# Patient Record
Sex: Male | Born: 1940
Health system: Southern US, Community
[De-identification: ages and names within clinical notes are randomized; demographics above are authoritative.]

## PROBLEM LIST (undated history)

## (undated) DIAGNOSIS — R300 Dysuria: Secondary | ICD-10-CM

## (undated) DIAGNOSIS — E119 Type 2 diabetes mellitus without complications: Secondary | ICD-10-CM

## (undated) DIAGNOSIS — I252 Old myocardial infarction: Secondary | ICD-10-CM

## (undated) DIAGNOSIS — Z85118 Personal history of other malignant neoplasm of bronchus and lung: Secondary | ICD-10-CM

## (undated) DIAGNOSIS — M1990 Unspecified osteoarthritis, unspecified site: Secondary | ICD-10-CM

## (undated) DIAGNOSIS — E7800 Pure hypercholesterolemia, unspecified: Secondary | ICD-10-CM

## (undated) DIAGNOSIS — Z8709 Personal history of other diseases of the respiratory system: Secondary | ICD-10-CM

## (undated) DIAGNOSIS — M069 Rheumatoid arthritis, unspecified: Secondary | ICD-10-CM

## (undated) DIAGNOSIS — Z87898 Personal history of other specified conditions: Secondary | ICD-10-CM

## (undated) DIAGNOSIS — N1330 Unspecified hydronephrosis: Secondary | ICD-10-CM

## (undated) DIAGNOSIS — I2510 Atherosclerotic heart disease of native coronary artery without angina pectoris: Secondary | ICD-10-CM

## (undated) DIAGNOSIS — I10 Essential (primary) hypertension: Secondary | ICD-10-CM

## (undated) DIAGNOSIS — Z87442 Personal history of urinary calculi: Secondary | ICD-10-CM

## (undated) DIAGNOSIS — R319 Hematuria, unspecified: Secondary | ICD-10-CM

## (undated) DIAGNOSIS — Z8546 Personal history of malignant neoplasm of prostate: Secondary | ICD-10-CM

## (undated) DIAGNOSIS — R7989 Other specified abnormal findings of blood chemistry: Secondary | ICD-10-CM

## (undated) DIAGNOSIS — R351 Nocturia: Secondary | ICD-10-CM

## (undated) DIAGNOSIS — Z8701 Personal history of pneumonia (recurrent): Secondary | ICD-10-CM

## (undated) DIAGNOSIS — M1A9XX0 Chronic gout, unspecified, without tophus (tophi): Secondary | ICD-10-CM

## (undated) DIAGNOSIS — K08109 Complete loss of teeth, unspecified cause, unspecified class: Secondary | ICD-10-CM

## (undated) DIAGNOSIS — Z973 Presence of spectacles and contact lenses: Secondary | ICD-10-CM

## (undated) DIAGNOSIS — Z86010 Personal history of colonic polyps: Secondary | ICD-10-CM

## (undated) DIAGNOSIS — Z95828 Presence of other vascular implants and grafts: Secondary | ICD-10-CM

## (undated) DIAGNOSIS — N289 Disorder of kidney and ureter, unspecified: Secondary | ICD-10-CM

## (undated) DIAGNOSIS — Z860101 Personal history of adenomatous and serrated colon polyps: Secondary | ICD-10-CM

## (undated) DIAGNOSIS — C801 Malignant (primary) neoplasm, unspecified: Secondary | ICD-10-CM

## (undated) DIAGNOSIS — Z86711 Personal history of pulmonary embolism: Secondary | ICD-10-CM

## (undated) DIAGNOSIS — Z972 Presence of dental prosthetic device (complete) (partial): Secondary | ICD-10-CM

## (undated) HISTORY — DX: Presence of other vascular implants and grafts: Z95.828

## (undated) HISTORY — DX: Atherosclerotic heart disease of native coronary artery without angina pectoris: I25.10

## (undated) HISTORY — PX: CARDIAC CATHETERIZATION: SHX172

## (undated) HISTORY — PX: OTHER SURGICAL HISTORY: SHX169

## (undated) HISTORY — PX: LUNG LOBECTOMY: SHX167

## (undated) HISTORY — PX: VEIN LIGATION AND STRIPPING: SHX2653

## (undated) HISTORY — PX: COLONOSCOPY: SHX174

## (undated) HISTORY — DX: Pure hypercholesterolemia, unspecified: E78.00

## (undated) HISTORY — DX: Essential (primary) hypertension: I10

## (undated) HISTORY — DX: Hematuria, unspecified: R31.9

---
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
## 1998-04-23 ENCOUNTER — Emergency Department (HOSPITAL_COMMUNITY): Admission: EM | Admit: 1998-04-23 | Discharge: 1998-04-23 | Payer: Self-pay | Admitting: Emergency Medicine

## 1998-05-17 DIAGNOSIS — Z85118 Personal history of other malignant neoplasm of bronchus and lung: Secondary | ICD-10-CM

## 1998-05-17 HISTORY — DX: Personal history of other malignant neoplasm of bronchus and lung: Z85.118

## 1998-05-18 ENCOUNTER — Ambulatory Visit (HOSPITAL_COMMUNITY): Admission: RE | Admit: 1998-05-18 | Discharge: 1998-05-18 | Payer: Self-pay | Admitting: Urology

## 1998-06-09 ENCOUNTER — Encounter: Payer: Self-pay | Admitting: Surgery

## 1998-06-10 ENCOUNTER — Inpatient Hospital Stay (HOSPITAL_COMMUNITY): Admission: RE | Admit: 1998-06-10 | Discharge: 1998-06-14 | Payer: Self-pay | Admitting: Surgery

## 1998-06-10 ENCOUNTER — Encounter: Payer: Self-pay | Admitting: Surgery

## 1998-06-11 ENCOUNTER — Encounter: Payer: Self-pay | Admitting: Surgery

## 1998-06-12 ENCOUNTER — Encounter: Payer: Self-pay | Admitting: Surgery

## 1998-06-12 ENCOUNTER — Encounter: Payer: Self-pay | Admitting: Urology

## 1999-11-12 ENCOUNTER — Encounter (INDEPENDENT_AMBULATORY_CARE_PROVIDER_SITE_OTHER): Payer: Self-pay | Admitting: Specialist

## 1999-11-12 ENCOUNTER — Ambulatory Visit (HOSPITAL_COMMUNITY): Admission: RE | Admit: 1999-11-12 | Discharge: 1999-11-12 | Payer: Self-pay | Admitting: Gastroenterology

## 2000-01-04 ENCOUNTER — Encounter: Payer: Self-pay | Admitting: Surgery

## 2000-01-04 ENCOUNTER — Encounter: Admission: RE | Admit: 2000-01-04 | Discharge: 2000-01-04 | Payer: Self-pay | Admitting: Surgery

## 2000-07-04 ENCOUNTER — Encounter: Admission: RE | Admit: 2000-07-04 | Discharge: 2000-07-04 | Payer: Self-pay | Admitting: Surgery

## 2000-07-04 ENCOUNTER — Encounter: Payer: Self-pay | Admitting: Surgery

## 2001-07-10 ENCOUNTER — Encounter: Admission: RE | Admit: 2001-07-10 | Discharge: 2001-07-10 | Payer: Self-pay | Admitting: Surgery

## 2001-07-10 ENCOUNTER — Encounter: Payer: Self-pay | Admitting: Surgery

## 2002-07-16 ENCOUNTER — Encounter: Payer: Self-pay | Admitting: Surgery

## 2002-07-16 ENCOUNTER — Encounter: Admission: RE | Admit: 2002-07-16 | Discharge: 2002-07-16 | Payer: Self-pay | Admitting: Surgery

## 2003-04-29 ENCOUNTER — Ambulatory Visit (HOSPITAL_COMMUNITY): Admission: RE | Admit: 2003-04-29 | Discharge: 2003-04-29 | Payer: Self-pay | Admitting: Gastroenterology

## 2004-10-12 ENCOUNTER — Encounter: Admission: RE | Admit: 2004-10-12 | Discharge: 2004-10-12 | Payer: Self-pay | Admitting: Surgery

## 2005-11-21 ENCOUNTER — Encounter: Admission: RE | Admit: 2005-11-21 | Discharge: 2005-11-21 | Payer: Self-pay | Admitting: Emergency Medicine

## 2007-07-04 ENCOUNTER — Encounter: Admission: RE | Admit: 2007-07-04 | Discharge: 2007-07-04 | Payer: Self-pay | Admitting: Emergency Medicine

## 2008-02-19 ENCOUNTER — Ambulatory Visit (HOSPITAL_COMMUNITY): Admission: RE | Admit: 2008-02-19 | Discharge: 2008-02-19 | Payer: Self-pay | Admitting: Urology

## 2008-03-20 ENCOUNTER — Ambulatory Visit: Admission: RE | Admit: 2008-03-20 | Discharge: 2008-05-20 | Payer: Self-pay | Admitting: Radiation Oncology

## 2008-05-20 ENCOUNTER — Ambulatory Visit: Admission: RE | Admit: 2008-05-20 | Discharge: 2008-07-16 | Payer: Self-pay | Admitting: Radiation Oncology

## 2008-06-27 ENCOUNTER — Ambulatory Visit (HOSPITAL_BASED_OUTPATIENT_CLINIC_OR_DEPARTMENT_OTHER): Admission: RE | Admit: 2008-06-27 | Discharge: 2008-06-27 | Payer: Self-pay | Admitting: Urology

## 2008-07-18 ENCOUNTER — Ambulatory Visit: Admission: RE | Admit: 2008-07-18 | Discharge: 2008-08-30 | Payer: Self-pay | Admitting: Radiation Oncology

## 2010-12-22 ENCOUNTER — Encounter: Payer: Self-pay | Admitting: Cardiology

## 2010-12-29 ENCOUNTER — Ambulatory Visit
Admission: RE | Admit: 2010-12-29 | Discharge: 2010-12-29 | Disposition: A | Discharge status: Home / Self Care | Payer: 59 | Source: Ambulatory Visit | Attending: Family Medicine | Admitting: Family Medicine

## 2010-12-29 ENCOUNTER — Other Ambulatory Visit: Payer: Self-pay | Admitting: Family Medicine

## 2010-12-29 DIAGNOSIS — Z85118 Personal history of other malignant neoplasm of bronchus and lung: Secondary | ICD-10-CM

## 2011-01-26 ENCOUNTER — Encounter: Payer: Self-pay | Admitting: Cardiology

## 2011-01-26 DIAGNOSIS — D126 Benign neoplasm of colon, unspecified: Secondary | ICD-10-CM | POA: Insufficient documentation

## 2011-01-26 DIAGNOSIS — I10 Essential (primary) hypertension: Secondary | ICD-10-CM | POA: Insufficient documentation

## 2011-01-26 DIAGNOSIS — C3490 Malignant neoplasm of unspecified part of unspecified bronchus or lung: Secondary | ICD-10-CM | POA: Insufficient documentation

## 2011-01-26 DIAGNOSIS — I2510 Atherosclerotic heart disease of native coronary artery without angina pectoris: Secondary | ICD-10-CM | POA: Insufficient documentation

## 2011-01-26 DIAGNOSIS — E7800 Pure hypercholesterolemia, unspecified: Secondary | ICD-10-CM

## 2011-01-26 DIAGNOSIS — C61 Malignant neoplasm of prostate: Secondary | ICD-10-CM

## 2011-01-26 DIAGNOSIS — N200 Calculus of kidney: Secondary | ICD-10-CM

## 2011-01-26 DIAGNOSIS — R7302 Impaired glucose tolerance (oral): Secondary | ICD-10-CM

## 2011-01-31 ENCOUNTER — Encounter: Payer: Self-pay | Admitting: Cardiology

## 2011-01-31 ENCOUNTER — Ambulatory Visit (INDEPENDENT_AMBULATORY_CARE_PROVIDER_SITE_OTHER): Payer: 59 | Admitting: Cardiology

## 2011-01-31 VITALS — BP 126/78 | HR 58 | Ht 66.0 in | Wt 168.6 lb

## 2011-01-31 DIAGNOSIS — I2510 Atherosclerotic heart disease of native coronary artery without angina pectoris: Secondary | ICD-10-CM

## 2011-01-31 DIAGNOSIS — I10 Essential (primary) hypertension: Secondary | ICD-10-CM

## 2011-01-31 NOTE — Progress Notes (Signed)
Daniel Vega Date of Birth: [birth year]   History of Present Illness:  Daniel Vega is seen at the request of Dr. Kevan Ny for evaluation of dyspnea. He is a pleasant 70 year old white male that I have seen in the distant past. He has a history of coronary disease and reports that he had a heart attack in 1994. He underwent a cardiac catheterization in 1997. We have no records concerning that study but apparently he had some coronary disease with occlusion of the distal vessel and collateral flow. He has been treated medically and has done very well since that time. He denies any symptoms of chest pain but does report some shortness of breath on exertion when he is working hard. He also notes that he doesn't have quite as much energy as he has in the past. The only chest discomfort he reports is a jabbing sensation in his chest if he is doing too much tobacco. He has never had to take any sublingual nitroglycerin.  Current Outpatient Prescriptions on File Prior to Visit  Medication Sig Dispense Refill  . aspirin 325 MG tablet Take 325 mg by mouth daily.        . diclofenac sodium (VOLTAREN) 1 % GEL Apply topically 4 (four) times daily as needed.        Marland Kitchen glucosamine-chondroitin 500-400 MG tablet Take 1 tablet by mouth 2 (two) times daily.        Marland Kitchen lisinopril (PRINIVIL,ZESTRIL) 10 MG tablet Take 10 mg by mouth daily.        . nitroGLYCERIN (NITROSTAT) 0.4 MG SL tablet Place 0.4 mg under the tongue every 5 (five) minutes as needed.        . pravastatin (PRAVACHOL) 20 MG tablet Take 20 mg by mouth at bedtime.        . triamterene-hydrochlorothiazide (MAXZIDE-25) 37.5-25 MG per tablet Take 1 tablet by mouth daily.        . verapamil (CALAN-SR) 240 MG CR tablet Take 240 mg by mouth 2 (two) times daily.        . vitamin E 400 UNIT capsule Take 400 Units by mouth daily.          No Known Allergies  Past Medical History  Diagnosis Date  . Hypertension   . Hypercholesteremia   . Glucose intolerance  (impaired glucose tolerance)   . Kidney stones, calcium oxalate   . CAD (coronary artery disease)   . 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 Adenomatous colon polyp     Dr. Madilyn Fireman  . Prostate cancer 2009    Dr. Hillis Range; tx w/ radium implants    Past Surgical History  Procedure Date  . Lobectomy 1999    left - Dr. Laneta Simmers  . Repair right testicular torsion 1962  . Cardiac catheterization 1997    Dr. Swaziland  . Prostate radium implants 2009  . Lung cancer surgery 2003  . Colonoscopy ;11/12/99;04/29/03    12/27/09    History  Smoking status  . Former Smoker  . Types: Cigarettes  . Quit date: 10/17/1982  Smokeless tobacco  . Current User  . Types: Chew    History  Alcohol Use  . 3.5 oz/week  . 7 drink(s) per week    occassionally     Family History  Problem Relation Age of Onset  . Heart disease Mother   . Hypertension Mother   . Heart disease Father   . Hypertension Father   . Stroke Father   .  Other Brother     spinal meningitis  . Other Brother     MVA  . Cancer Sister     stomach    Review of Systems: The review of systems is positive for occasional dizziness. He reports that his cholesterol levels have been good.  All other systems were reviewed and are negative.  Physical Exam: BP 126/78  Pulse 58  Ht 5\' 6"  (1.676 m)  Wt 168 lb 9.6 oz (76.476 kg)  BMI 27.21 kg/m2 The patient is alert and oriented x 3.  The mood and affect are normal.  The skin is warm and dry.  Color is normal.  The HEENT exam reveals that the sclera are nonicteric.  The mucous membranes are moist.  The carotids are 2+ without bruits.  There is no thyromegaly.  There is no JVD.  The lungs are clear.  The chest wall is non tender.  The heart exam reveals a regular rate with a normal S1 and S2.  There are no murmurs, gallops, or rubs.  The PMI is not displaced.   Abdominal exam reveals good bowel sounds.  There is no guarding or rebound.  There is no hepatosplenomegaly or  tenderness.  There are no masses.  Exam of the legs reveal no clubbing, cyanosis, or edema.  The legs are without rashes.  The distal pulses are intact.  Cranial nerves II - XII are intact.  Motor and sensory functions are intact.  The gait is normal.  LABORATORY DATA:  ECG today demonstrates sinus bradycardia with a rate of 58 beats per minute an occasional PVC. It is otherwise normal. Assessment / Plan:

## 2011-01-31 NOTE — Assessment & Plan Note (Signed)
Well controlled on lisinopril and HCTZ

## 2011-01-31 NOTE — Assessment & Plan Note (Signed)
Remote cardiac catheterization demonstrated some degree of coronary disease. He does have some symptoms of dyspnea on exertion. I have recommended a followup stress echo to assess his risk. He is on appropriate therapy with aspirin and a statin.

## 2011-01-31 NOTE — Patient Instructions (Signed)
Continue current therapy.  Stay active with regular aerobic exercise.  I recommend you quit chewing tobacco.  We will schedule you for a stress Echo test to evaluate your coronary risk.

## 2011-02-07 ENCOUNTER — Other Ambulatory Visit (HOSPITAL_COMMUNITY): Payer: 59

## 2011-02-07 ENCOUNTER — Ambulatory Visit (HOSPITAL_COMMUNITY): Payer: 59 | Attending: Family Medicine

## 2011-02-07 ENCOUNTER — Other Ambulatory Visit (HOSPITAL_COMMUNITY): Payer: 59 | Admitting: Radiology

## 2011-02-07 ENCOUNTER — Ambulatory Visit (HOSPITAL_COMMUNITY): Payer: 59 | Attending: Cardiology | Admitting: Radiology

## 2011-02-07 DIAGNOSIS — I252 Old myocardial infarction: Secondary | ICD-10-CM | POA: Insufficient documentation

## 2011-02-07 DIAGNOSIS — R0602 Shortness of breath: Secondary | ICD-10-CM

## 2011-02-07 DIAGNOSIS — R0609 Other forms of dyspnea: Secondary | ICD-10-CM | POA: Insufficient documentation

## 2011-02-07 DIAGNOSIS — I2510 Atherosclerotic heart disease of native coronary artery without angina pectoris: Secondary | ICD-10-CM

## 2011-02-07 DIAGNOSIS — I10 Essential (primary) hypertension: Secondary | ICD-10-CM | POA: Insufficient documentation

## 2011-02-07 DIAGNOSIS — R0989 Other specified symptoms and signs involving the circulatory and respiratory systems: Secondary | ICD-10-CM

## 2011-02-07 DIAGNOSIS — E785 Hyperlipidemia, unspecified: Secondary | ICD-10-CM | POA: Insufficient documentation

## 2011-02-09 ENCOUNTER — Telehealth: Payer: Self-pay | Admitting: *Deleted

## 2011-02-09 ENCOUNTER — Ambulatory Visit (INDEPENDENT_AMBULATORY_CARE_PROVIDER_SITE_OTHER): Payer: 59 | Admitting: Cardiology

## 2011-02-09 ENCOUNTER — Encounter: Payer: Self-pay | Admitting: Cardiology

## 2011-02-09 VITALS — BP 130/76 | HR 75 | Wt 172.0 lb

## 2011-02-09 DIAGNOSIS — I10 Essential (primary) hypertension: Secondary | ICD-10-CM

## 2011-02-09 DIAGNOSIS — I2510 Atherosclerotic heart disease of native coronary artery without angina pectoris: Secondary | ICD-10-CM

## 2011-02-09 NOTE — Progress Notes (Signed)
Daniel Vega Date of Birth: 1940/12/03   History of Present Illness: Daniel Vega is seen for followup after his recent stress echo. He was able to walk 6 minutes on the Bruce protocol. He did experience symptoms of significant dyspnea and leg fatigue. He did achieve a target heart rate of 134 beats per minute. He had fairly profound ST segment depression inferolateral leads. His echocardiographic images demonstrated normal LV function without significant wall motion abnormality. Is felt to be a fairly high risk study.  Current Outpatient Prescriptions on File Prior to Visit  Medication Sig Dispense Refill  . Ascorbic Acid (VITAMIN C) 1000 MG tablet Take 1 tablet (1,000 mg total) by mouth daily.  30 tablet    . aspirin 325 MG tablet Take 325 mg by mouth daily.        Marland Kitchen glucosamine-chondroitin 500-400 MG tablet Take 1 tablet by mouth 2 (two) times daily.        Marland Kitchen lisinopril (PRINIVIL,ZESTRIL) 10 MG tablet Take 10 mg by mouth daily.        . nitroGLYCERIN (NITROSTAT) 0.4 MG SL tablet Place 0.4 mg under the tongue every 5 (five) minutes as needed.        . pravastatin (PRAVACHOL) 20 MG tablet Take 20 mg by mouth at bedtime.        . triamterene-hydrochlorothiazide (MAXZIDE-25) 37.5-25 MG per tablet Take 1 tablet by mouth daily.        . verapamil (CALAN-SR) 240 MG CR tablet Take 240 mg by mouth 2 (two) times daily.        . vitamin E 400 UNIT capsule Take 400 Units by mouth daily.        . diclofenac sodium (VOLTAREN) 1 % GEL Apply topically 4 (four) times daily as needed.          No Known Allergies  Past Medical History  Diagnosis Date  . Hypertension   . Hypercholesteremia   . Glucose intolerance (impaired glucose tolerance)   . Kidney stones, calcium oxalate   . CAD (coronary artery disease)   . 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 Adenomatous colon polyp     Dr. Madilyn Fireman  . Prostate cancer 2009    Dr. Hillis Range; tx w/ radium implants    Past Surgical History    Procedure Date  . Lobectomy 1999    left - Dr. Laneta Simmers  . Repair right testicular torsion 1962  . Cardiac catheterization 1997    Dr. [place]  . Prostate radium implants 2009  . Lung cancer surgery 2003  . Colonoscopy ;11/12/99;04/29/03    12/27/09    History  Smoking status  . Former Smoker  . Types: Cigarettes  . Quit date: 10/17/1982  Smokeless tobacco  . Current User  . Types: Chew    History  Alcohol Use  . 3.5 oz/week  . 7 drink(s) per week    occassionally     Family History  Problem Relation Age of Onset  . Heart disease Mother   . Hypertension Mother   . Heart disease Father   . Hypertension Father   . Stroke Father   . Other Brother     spinal meningitis  . Other Brother     MVA  . Cancer Sister     stomach    Review of Systems: The review of systems is positive for occasional dizziness. He reports that his cholesterol levels have been good.  All other systems were reviewed  and are negative.  Physical Exam: BP 130/76  Pulse 75  Wt 172 lb (78.019 kg) The patient is alert and oriented x 3.  The mood and affect are normal.  The skin is warm and dry.  Color is normal.  The HEENT exam reveals that the sclera are nonicteric.  The mucous membranes are moist.  The carotids are 2+ without bruits.  There is no thyromegaly.  There is no JVD.  The lungs are clear.  The chest wall is non tender.  The heart exam reveals a regular rate with a normal S1 and S2.  There are no murmurs, gallops, or rubs.  The PMI is not displaced.   Abdominal exam reveals good bowel sounds.  There is no guarding or rebound.  There is no hepatosplenomegaly or tenderness.  There are no masses.  Exam of the legs reveal no clubbing, cyanosis, or edema.  The legs are without rashes.  The distal pulses are intact.  Cranial nerves II - XII are intact.  Motor and sensory functions are intact.  The gait is normal.  LABORATORY DATA:  Prior chest x-ray in March showed no active disease. Assessment  / Plan:

## 2011-02-09 NOTE — Telephone Encounter (Signed)
Message copied by Murrell Redden on Wed Feb 09, 2011  3:24 PM ------      Message from: [place], PETER      Created: Tue Feb 08, 2011  4:07 PM       Significant ST changes but Echo images ok. Will discuss at office visit tomorrow.

## 2011-02-09 NOTE — Telephone Encounter (Signed)
Notified of Echo. To see [place] 4/25 to discuss cath.

## 2011-02-09 NOTE — Patient Instructions (Signed)
We will schedule you for a cardiac catherterization this Friday with possible intervention if needed.  Continue your current medication.

## 2011-02-09 NOTE — Assessment & Plan Note (Signed)
Continue current antihypertensive therapy. 

## 2011-02-09 NOTE — Assessment & Plan Note (Signed)
Stress test is high risk. I recommended cardiac catheterization to further define his coronary anatomy and treatment options. He will undergo this procedure is on Friday, April 27. Will consider possible PCI if needed. Fully discuss the procedure and risk with the patient and his wife and they're agreeable to proceed.

## 2011-02-10 LAB — BASIC METABOLIC PANEL
BUN: 26 mg/dL — ABNORMAL HIGH (ref 6–23)
Calcium: 9.1 mg/dL (ref 8.4–10.5)
Creatinine, Ser: 1 mg/dL (ref 0.4–1.5)

## 2011-02-10 LAB — CBC WITH DIFFERENTIAL/PLATELET
Basophils Absolute: 0 10*3/uL (ref 0.0–0.1)
HCT: 33.6 % — ABNORMAL LOW (ref 39.0–52.0)
Hemoglobin: 11.6 g/dL — ABNORMAL LOW (ref 13.0–17.0)
Lymphocytes Relative: 27.5 % (ref 12.0–46.0)
Neutrophils Relative %: 59.1 % (ref 43.0–77.0)
RBC: 3.32 Mil/uL — ABNORMAL LOW (ref 4.22–5.81)
RDW: 13.1 % (ref 11.5–14.6)

## 2011-02-10 LAB — PROTIME-INR: INR: 1 ratio (ref 0.8–1.0)

## 2011-02-11 ENCOUNTER — Observation Stay (HOSPITAL_COMMUNITY)
Admission: RE | Admit: 2011-02-11 | Discharge: 2011-02-11 | Disposition: A | Discharge status: Home / Self Care | Payer: 59 | Source: Ambulatory Visit | Attending: Cardiology | Admitting: Cardiology

## 2011-02-11 DIAGNOSIS — R9439 Abnormal result of other cardiovascular function study: Secondary | ICD-10-CM | POA: Insufficient documentation

## 2011-02-11 DIAGNOSIS — I2510 Atherosclerotic heart disease of native coronary artery without angina pectoris: Secondary | ICD-10-CM

## 2011-02-11 DIAGNOSIS — R0989 Other specified symptoms and signs involving the circulatory and respiratory systems: Secondary | ICD-10-CM | POA: Insufficient documentation

## 2011-02-11 DIAGNOSIS — R0609 Other forms of dyspnea: Secondary | ICD-10-CM | POA: Insufficient documentation

## 2011-02-13 NOTE — Cardiovascular Report (Signed)
  Daniel Vega, Daniel Vega NO.:  [id number]  MEDICAL RECORD NO.:  [id number]           PATIENT TYPE:  O  LOCATION:  6522                         FACILITY:  [facility]  PHYSICIAN:  Madisson Kulaga M. [place], M.D.  DATE OF BIRTH:  [birth year]  DATE OF PROCEDURE: DATE OF DISCHARGE:  02/11/2011                           CARDIAC CATHETERIZATION   INDICATIONS FOR PROCEDURE:  A 70 year old white male who presents with symptoms of dyspnea on exertion.  He had an abnormal stress echo showing significant ST-segment changes associated with dyspnea.  Cardiac catheterization was recommended.  He has a remote history of myocardial infarction in 1997.  PROCEDURE:  Left heart catheterization, coronary and left ventricular angiography.  ACCESS:  Via the right radial artery using standard Seldinger technique.  EQUIPMENT:  5-French 4-cm right Judkins catheter, 5-French 3.5-cm left Judkins catheter, 5-French pigtail catheter, 5-French arterial sheath.  MEDICATIONS:  Local anesthesia 1% Xylocaine, Versed 2 mg IV, verapamil 3 mg intra-arterial.  CONTRAST:  80 mL of Omnipaque.  HEMODYNAMIC DATA:  Aortic pressure was 143/68 with a mean of 95 mmHg, left ventricular pressure is 152 with EDP of 19 mmHg.  ANGIOGRAPHIC DATA:  The right coronary arises and is a dominant vessel. It has an extensive dissection from the proximal to distal vessel and is occluded distally.  There are excellent left-to-right collaterals to the distal right coronary artery.  The left main coronary artery is normal.  Left anterior descending artery has modest disease in the midvessel up to 30% immediately after the takeoff of the first diagonal and then a 40% lesion in the midvessel.  The left circumflex coronary artery is normal.  Left ventricular angiography performed in the RAO view demonstrates normal left ventricular size with inferior basal hypokinesia and overall ejection fraction of 55%.  FINAL IMPRESSION: 1.  Single-vessel occlusive atherosclerotic coronary artery disease     involving the distal right coronary artery with     excellent collateral flow. 2. Good left ventricular systolic function.  PLAN:  Would recommend continued medical therapy.          ______________________________ Quetzal Meany M. [place], M.D.     PMJ/MEDQ  D:  02/11/2011  T:  02/12/2011  Job:  [id number]  cc:   Duncan Dull, M.D.  Electronically Signed by Dakiyah Heinke [place] M.D. on 02/13/2011 12:40:18 PM

## 2011-03-01 NOTE — Op Note (Signed)
[name], Daniel Vega               ACCOUNT NO.:  [id number]   MEDICAL RECORD NO.:  [id number]          PATIENT TYPE:  AMB   LOCATION:  [facility]                         FACILITY:  [facility]   PHYSICIAN:  Bertram Millard. Dahlstedt, M.D.DATE OF BIRTH:  08/17/1941   DATE OF PROCEDURE:  06/27/2008  DATE OF DISCHARGE:                               OPERATIVE REPORT   PREOPERATIVE DIAGNOSIS:  Adenocarcinoma the prostate.   POSTOPERATIVE DIAGNOSIS:  Adenocarcinoma the prostate.   PROCEDURE:  I-125 brachytherapy.   SURGEON:  Bertram Millard. Dahlstedt, M.D.   RADIATION ONCOLOGIST:  Artist Pais. Kathrynn Running, M.D.   COMPLICATIONS:  None.   ANESTHESIA:  General with LMA.   BRIEF HISTORY:  The patient has a history of adenocarcinoma of the  prostate.  He originally presented in March with a PSA of 6.5.  Recheck  revealed a similar number.  Ultrasound of the biopsy of the prostate  revealed a normal size gland.  Out of 12 biopsies, 7 came back positive  for cancer, mainly on the left side. His histology was Gleason 3+3.   The patient has completed external beam radiation, and presents at this  time for seed implantation.  Risks and complications of the procedure  have been discussed with the patient by both Dr. Kathrynn Running and myself.  He  desires to proceed.   DESCRIPTION OF PROCEDURE:  The patient was identified in the holding  area.  He was administered preoperative IV antibiotics.  He was taken to  the operating room where general anesthetic was administered using the  LMA.  He was placed in the dorsal lithotomy position.  Rectal tube and  Foley catheter was placed.  Transrectal ultrasound probe was placed.  Dr. Kathrynn Running imaged the prostate, as well as the urethra and rectum.  Treatment planning was then performed.   Following adequate treatment plan, the needle template was placed on the  patient's perineum.  Fixation needles were then placed.  According to  plan, 27 needles were used to deliver 71 seeds.  For  specifics, please  see the oncology treatment plan.   Following placement of all seeds, the Foley catheter was removed.  Cystoscopy revealed a small clot in the bladder, but no intravesical  seeds.  The urethra was clear as well.  A catheter was not left in.  The  bladder was drained.   The patient tolerated the procedure well.  He was awakened and taken to  the PACU in stable condition.   He will be discharged home on Cipro and Vicodin.  He will follow-up with  the both Dr. Kathrynn Running in myself in 3 weeks.      Bertram Millard. Dahlstedt, M.D.  Electronically Signed     SMD/MEDQ  D:  06/27/2008  T:  06/28/2008  Job:  [id number]

## 2011-03-04 NOTE — Op Note (Signed)
   NAME:  Daniel Vega, Daniel Vega                         ACCOUNT NO.:  [id number]   MEDICAL RECORD NO.:  [id number]                   PATIENT TYPE:  AMB   LOCATION:  ENDO                                 [facility]ACILITY:  [facility]   PHYSICIAN:  John C. Madilyn Fireman, M.D.                 DATE OF BIRTH:  07-31-1941   DATE OF PROCEDURE:  04/29/2003  DATE OF DISCHARGE:                                 OPERATIVE REPORT   PROCEDURE:  Colonoscopy.   INDICATION FOR PROCEDURE:  History of adenomatous colon polyps.   DESCRIPTION OF PROCEDURE:  The patient was placed in the left lateral  decubitus position and placed on the pulse monitor with continuous low-flow  oxygen delivered by nasal cannula.  He was sedated with 75 mcg IV fentanyl  and 7.5 mg IV Versed.  The Olympus video colonoscope was inserted into the  rectum and advanced to the cecum, confirmed by transillumination at  McBurney's point and visualization of the ileocecal valve and appendiceal  orifice.  The prep was excellent.  The cecum, ascending, transverse,  descending, and sigmoid colon all appeared normal with no masses, polyps,  diverticula, or other mucosal abnormalities.  The rectum likewise appeared  normal, and retroflexed view of the anus revealed no obvious internal  hemorrhoids.  The scope was then withdrawn, and the patient returned to the  recovery room in stable condition.  He tolerated the procedure well, and  there were no immediate complications.   IMPRESSION:  Normal colonoscopy.   PLAN:  Repeat study in five years.                                               John C. Madilyn Fireman, M.D.    JCH/MEDQ  D:  04/29/2003  T:  04/29/2003  Job:  [id number]   cc:   Oley Balm. Georgina Pillion, M.D.  [address]  [place]  [place] [address]  Fax: [phone]

## 2011-03-10 NOTE — Discharge Summary (Addendum)
  NAMEPLUMMER, Daniel Vega  MEDICAL RECORD NO.:  Vega           PATIENT TYPE:  O  LOCATION:  6522                         FACILITY:  [facility]  PHYSICIAN:  Daniel Vega, M.D.  DATE OF BIRTH:  06/17/1941  DATE OF ADMISSION:  02/11/2011 DATE OF DISCHARGE:  02/11/2011                              DISCHARGE SUMMARY   DISCHARGE DIAGNOSES: 1. Coronary artery disease.     a.     Single vessel occlusive coronary artery disease with right      coronary artery with good collateral flow, and normal left      ventricular function, for medical therapy by catheterization on      February 11, 2011. 2. Hypertension. 3. Hypercholesteremia. 4. Impaired glucose tolerance. 5. History of kidney stones. 6. Adenosquamous carcinoma of the lungs. 7. Adenomatous colon polyps. 8. Prostate cancer.  HOSPITAL COURSE:  Daniel Vega is a 70 year old gentleman who is has a history of known CAD followed by Dr. [place], who was seen and follow up on February 09, 2011 after his most recent stress echo.  He had daily provided an ST-segment depression in inferolateral leads, with that echo.  Imaging demonstrated normal LV function without significant wall abnormality.  It was felt to be a fairly high risk study.  Dr. [place] recommended cardiac catheterization, which the patient underwent on February 11, 2011 by Dr. [place], demonstrating single-vessel coronary occlusive disease with involving the distal RCA with excellent collateral flow.  His EF was 55%.  Dr. [place] felt the patient would be best serve as medical therapy, has seen and examined him, and felt he is stable for discharge.  DISCHARGE LABS:  None.  DISCHARGE STUDIES:  Cardiac catheterization on February 11, 2011, please see full report for details.  MEDICATIONS: 1. Glucosamine one tablet b.i.d. 2. Vitamin E 400 units one tablet every morning. 3. Vitamin C OTC one tablet daily. 4. Aspirin 81 mg every morning. 5. Verapamil  240 mg b.i.d. 6. Stool softener one tablet daily. 7. Triamterene/hydrochlorothiazide 37.5/25 mg p.o. every morning. 8. Pravastatin 40 mg every morning. 9. Lisinopril 10 mg every morning.  DISPOSITION:  The patient will be discharged in stable condition to home.  He is instructed to keep his cath site clean and dry and call or return for any problems.  He is to maintain a heart healthy diet, and return for any symptoms concerning to him.  He will follow up with Dr. [place] as an outpatient for further monitoring.  DURATION OF DISCHARGE ENCOUNTER:  Greater than 30 minutes including physician and PA time.     Daniel Vega, P.A.C.   ______________________________ Daniel Vega, M.D.    DD/MEDQ  D:  03/07/2011  T:  03/08/2011  Job:  [id number]  Electronically Signed by Daniel Vega M.D. on 03/10/2011 11:50:39 AM Electronically Signed by Daniel Vega  on 03/18/2011 11:16:27 AM

## 2011-08-16 ENCOUNTER — Telehealth: Payer: Self-pay | Admitting: *Deleted

## 2011-08-25 ENCOUNTER — Encounter: Payer: Self-pay | Admitting: *Deleted

## 2011-09-02 ENCOUNTER — Other Ambulatory Visit: Payer: Self-pay | Admitting: Oncology

## 2011-09-06 ENCOUNTER — Telehealth: Payer: Self-pay | Admitting: Oncology

## 2011-09-06 ENCOUNTER — Ambulatory Visit: Payer: 59

## 2011-09-06 ENCOUNTER — Ambulatory Visit (HOSPITAL_BASED_OUTPATIENT_CLINIC_OR_DEPARTMENT_OTHER): Payer: 59 | Admitting: Oncology

## 2011-09-06 ENCOUNTER — Other Ambulatory Visit (HOSPITAL_BASED_OUTPATIENT_CLINIC_OR_DEPARTMENT_OTHER): Payer: 59 | Admitting: Lab

## 2011-09-06 DIAGNOSIS — D539 Nutritional anemia, unspecified: Secondary | ICD-10-CM

## 2011-09-06 LAB — CBC WITH DIFFERENTIAL/PLATELET
Basophils Absolute: 0.1 10*3/uL (ref 0.0–0.1)
EOS%: 3.1 % (ref 0.0–7.0)
HGB: 12.3 g/dL — ABNORMAL LOW (ref 13.0–17.1)
MONO#: 0.6 10*3/uL (ref 0.1–0.9)
NEUT%: 72.8 % (ref 39.0–75.0)
Platelets: 204 10*3/uL (ref 140–400)
RDW: 12.5 % (ref 11.0–14.6)
WBC: 8.9 10*3/uL (ref 4.0–10.3)

## 2011-09-06 NOTE — Progress Notes (Signed)
Note Dictated

## 2011-09-06 NOTE — Progress Notes (Signed)
CC:   Duncan Dull, M.D.  REASON FOR CONSULTATION:  Elevated iron level, rule out hemochromatosis.  HISTORY OF PRESENT ILLNESS:  [name] is a pleasant 70 year old gentleman, native of [address], currently lives in [place], also lived in [place] for a while.  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  He has had does have a past medical history significant for hypertension and hyperlipidemia, also history of coronary artery disease.  He gets his routine care at [facility] with Dr. Kevan Ny and upon his recent evaluation back in September 2012 he had a routine checkup, which showed he had a hemoglobin of 11.6.  His MCV was 103.  His white cell count was 5.1.  His iron level also slightly elevated at 290, iron saturation of 90%, and transferrin of 230.  The patient was sent to me for evaluation for possible hemochromatosis.  Clinically [name] is asymptomatic from that standpoint.  He has really never had any abnormalities in his liver function tests.  His LFTs have been checked routinely and have been normal, including the ones that were done in September 2012 showed his AST, ALT, total bilirubin, alkaline phosphatase all within normal range at this time.  However, most recently his hemoglobin has been rechecked again and his hemoglobin was 12.1 and his iron level was 275, which is slightly less, iron saturation at 90%, his transferrin was 219, which was normal.  He did have a folic acid level that was normal at 5.6, vitamin B12 at 255.  As mentioned, his MCV is slightly elevated at 103.  He had not had any thrombotic events.  He had not had any bleeding complications.  Had not really had any particular change in any dietary habits at this time.  He has continued to perform activities of daily living without any hindrance or decline.  He does report joint swelling in his hands and he did have a mildly elevated sed  rate at 21 that was done in January 2011.  REVIEW OF SYSTEMS:  He does not report any headaches, blurry vision, double vision.  Does not report any motor or sensory neuropathy.  Does not report any alteration in mental status.  Does not report any psychiatric issues or depression.  Does not report any fever, chills, sweats.  Does not report any cough, hemoptysis, hematemesis.  No nausea or vomiting or abdominal pain.  No hematochezia, melena, or genitourinary complaints.  No frequency, urgency, or hesitancy.  No musculoskeletal complaints.  No arthralgias or myalgias.  Does report arthritis and joint swelling and stiffness.  Did not report any chest pain, orthopnea, PND.  Did not report any palpitations.  Did not report any shortness of breath or difficulty breathing.  Did not report any chest pain, orthopnea, palpitations.  Did not report any nausea or vomiting.  No abdominal pain.  No hematochezia or melena.  No genitourinary complaints.  Rest of review of systems is unremarkable.  PAST MEDICAL HISTORY:  Significant for as mentioned, hypertension.  He is a pre-diabetic.  History of coronary artery disease.  History of lung cancer, status post surgical resection in 1999.  He had a history of prostate cancer with seed implants in 2009, followed by Dr. Retta Diones. Also history of hyperlipidemia.  MEDICATIONS:  He is on Xanax, aspirin, Lipitor, vitamin B12, Voltaren, glucosamine, lisinopril, nitroglycerin, Maxzide, verapamil, and vitamin E.  ALLERGIES:  None.  SOCIAL HISTORY:  He is married.  He does drink about 1 or 2 liquor drinks a day.  He has been doing that for the last 3 years.  Denied any tobacco abuse.  Worked as a Music therapist.  FAMILY HISTORY:  Really no history of blood disorders.  She could not really tell me if there is any history of hemochromatosis.  As far as he knows, there is none.  There is a history of breast cancer and gastric cancer in 2 of his  sisters.  PHYSICAL EXAMINATION:  General:  Alert, awake gentleman, appeared in no active distress.  Vital Signs:  His blood pressure today is 106/58, pulse is 64, respirations 18, temperature is 97.2.  HEENT:  Head is normocephalic, atraumatic.  Pupils equal, round, reactive to light. Oral mucosa moist and pink.  Neck:  Supple without lymphadenopathy. Heart:  Regular rate and rhythm, S1 and S2.  Lungs:  Clear to auscultation.  No rhonchi, wheezes, or dullness to percussion.  Abdomen: Soft, nontender.  No hepatosplenomegaly.  Extremities:  No clubbing, cyanosis, or edema.  Neurologically:  Intact motor, sensory, and deep tendon reflexes.  LABORATORY DATA:  Showed a hemoglobin of 12.3, white cell count of 8.9, platelet count of 204.  His MCV is slightly elevated at 100.6.  ASSESSMENT AND PLAN:  A 70 year old gentleman with the following findings: 1. Elevated iron level.  The differential diagnosis was discussed     today in detail with [name].  I feel that genetic     hemochromatosis and inherent iron metabolism disorder, I think is     less likely, but certainly a consideration.  Certainly we will     check his hemochromatosis DNA studies to make sure we are not     dealing with iron overload condition.  Other conditions that     certainly affect iron metabolism and iron increase predominantly     are usually liver disease, hepatitis, as well as cirrhosis of the     liver, which he does not really have any history that.  Certainly     alcohol consumption can certainly contribute to that and maybe he     is minimizing the amount alcohol he is taking and certainly can     affect that.  Also autoimmune disorder:  He does have really joint     stiffness and swelling.  I wonder if he had an autoimmune condition     that can certainly reflect increase in his iron metabolism.  I     would probably suggest a rheumatological workup as well at some     point.  In terms of a management  standpoint, I really see no reason     for any phlebotomy at this point.  I do not really see any evidence     too suggest end-organ damage, such as hepatotoxicity, pancreatic     insufficiency, bronze diabetes at this point.  To completely work     him up at this time, I will obtain ferritin level, as well as TIBC     and as mentioned a hemochromatosis DNA analysis.  We will ask him     to follow up in 2 to 3 months.  We will discuss these results at     that time.  I will recheck his iron levels at that time. 2. Macrocytic anemia.  Again, I am not quite sure how that fits with     his slightly elevated iron, but certainly one combining factor  would be liver disease.  Certainly alcohol consumption could cause     macrocytosis, as well as it also can cause a reactive ferritin     elevation as an acute phase reactant, and can really all be linked     together.  But nonetheless, other conditions such as B12 or folate     deficiency, which have been checked rigorously.  Myelodysplastic     syndrome also could be a possibility at this time, which could     impair iron metabolism and could manifest itself with macrocytosis     as such.  Nonetheless, again his levels are relatively mild and I     will continue to observe him for the time being.    ______________________________ Benjiman Core, M.D. FNS/MEDQ  D:  09/06/2011  T:  09/06/2011  Job:  [id number]

## 2011-09-06 NOTE — Telephone Encounter (Signed)
gv pt appt schedule for feb °

## 2011-09-10 LAB — IRON AND TIBC
%SAT: 53 % (ref 20–55)
Iron: 165 ug/dL (ref 42–165)
TIBC: 311 ug/dL (ref 215–435)
UIBC: 146 ug/dL (ref 125–400)

## 2011-09-10 LAB — HEMOCHROMATOSIS DNA-PCR(C282Y,H63D)

## 2011-12-14 ENCOUNTER — Ambulatory Visit (HOSPITAL_BASED_OUTPATIENT_CLINIC_OR_DEPARTMENT_OTHER): Payer: BC Managed Care – PPO | Admitting: Oncology

## 2011-12-14 ENCOUNTER — Other Ambulatory Visit (HOSPITAL_BASED_OUTPATIENT_CLINIC_OR_DEPARTMENT_OTHER): Payer: BC Managed Care – PPO | Admitting: Lab

## 2011-12-14 ENCOUNTER — Telehealth: Payer: Self-pay | Admitting: Oncology

## 2011-12-14 DIAGNOSIS — R7989 Other specified abnormal findings of blood chemistry: Secondary | ICD-10-CM

## 2011-12-14 LAB — CBC WITH DIFFERENTIAL/PLATELET
Basophils Absolute: 0 10*3/uL (ref 0.0–0.1)
Eosinophils Absolute: 0.2 10*3/uL (ref 0.0–0.5)
HCT: 36.9 % — ABNORMAL LOW (ref 38.4–49.9)
LYMPH%: 35.8 % (ref 14.0–49.0)
MONO#: 0.5 10*3/uL (ref 0.1–0.9)
NEUT#: 2.4 10*3/uL (ref 1.5–6.5)
NEUT%: 48.9 % (ref 39.0–75.0)
Platelets: 157 10*3/uL (ref 140–400)
WBC: 5 10*3/uL (ref 4.0–10.3)

## 2011-12-14 LAB — IRON AND TIBC
%SAT: 63 % — ABNORMAL HIGH (ref 20–55)
Iron: 195 ug/dL — ABNORMAL HIGH (ref 42–165)
TIBC: 308 ug/dL (ref 215–435)

## 2011-12-14 LAB — COMPREHENSIVE METABOLIC PANEL
BUN: 31 mg/dL — ABNORMAL HIGH (ref 6–23)
CO2: 25 mEq/L (ref 19–32)
Creatinine, Ser: 1.24 mg/dL (ref 0.50–1.35)
Glucose, Bld: 105 mg/dL — ABNORMAL HIGH (ref 70–99)
Total Bilirubin: 0.7 mg/dL (ref 0.3–1.2)
Total Protein: 7.1 g/dL (ref 6.0–8.3)

## 2011-12-14 LAB — FERRITIN: Ferritin: 572 ng/mL — ABNORMAL HIGH (ref 22–322)

## 2011-12-14 NOTE — Progress Notes (Signed)
CC:   Duncan Dull, M.D.  PRINCIPAL DIAGNOSIS:  This is a 71 year old gentleman with an elevated ferritin level.  Differential diagnosis including a history of alcohol use versus questionable hemochromatosis.  CURRENT THERAPY:  Observation and surveillance.  HISTORY OF PRESENT ILLNESS:  [name] is a pleasant 71 year old gentleman who I saw for the first time back in November of 2012 for the evaluation for possible elevated iron levels.  He is here to discuss his workup.  When I saw him at that time, his hemoglobin was normal at 12.3. He had normal differential with a slightly elevated MCV of 100.6.  His iron levels were repeated.  He had a normal level iron of 165.  His ferritin is elevated at 679, but saturation of iron was normal at 53%, TIBC was 311.  He is asymptomatic and he is not reporting any symptoms of headaches or blurry vision.  He has not reported any major changes in his performance status or activity level.  REVIEW OF SYSTEMS:  Reviewed today and was negative at this time.  PHYSICAL EXAMINATION:  Alert, awake gentleman appeared in no active distress.  His blood pressure is 113/60, pulse 67, respiration 20, temperature is 97.  Head:  Normocephalic, atraumatic.  Pupils equal, round, reactive to light.  Oral mucosa moist and pink.  Neck:  Supple. No lymphadenopathy.  Heart:  Regular rate and rhythm.  S1, S2.  Lungs: Clear to auscultation.  No rhonchi or wheeze.  Abdomen:  Soft, nontender.  No hepatosplenomegaly.  Extremities:  No edema.  LABORATORY DATA:  Today showed a hemoglobin of 13, white cell count of 5.0, platelet count of 157.  His MCV is 96.6 which was normal.  ASSESSMENT/PLAN:  A 71 year old gentleman with the following issues. 1. Elevated ferritin level.  The differential diagnosis at this time     include possible elevation due to acute phase reactant.  Ferritin     is known to have elevation in chronic inflammatory conditions.  He     does have  arthritic pains.  I wonder if he might have an autoimmune     disorder, maybe it is causing the elevation in his ferritin.  Also     alcohol consumption can play a role as well.  I doubt he has     genetic hemochromatosis.  His liver function tests are fairly     normal.  However, I am repeating it today.  At this time he does     not really need any hematological intervention. 2. Elevated mean corpuscular volume that has resolved at this time.     His MCV is perfectly normal. 3. Followup.  I will continue to monitor his blood counts.  I am going     to repeat his iron levels as well as a CBC in 6 months.    ______________________________ Benjiman Core, M.D. FNS/MEDQ  D:  12/14/2011  T:  12/14/2011  Job:  [id number]

## 2011-12-14 NOTE — Progress Notes (Signed)
Note dictated

## 2011-12-14 NOTE — Telephone Encounter (Signed)
Gv pt appt for aug2013 °

## 2012-02-06 ENCOUNTER — Ambulatory Visit
Admission: RE | Admit: 2012-02-06 | Discharge: 2012-02-06 | Disposition: A | Discharge status: Home / Self Care | Payer: BC Managed Care – PPO | Source: Ambulatory Visit | Attending: Family Medicine | Admitting: Family Medicine

## 2012-02-06 ENCOUNTER — Other Ambulatory Visit: Payer: Self-pay | Admitting: Family Medicine

## 2012-02-06 DIAGNOSIS — Z85118 Personal history of other malignant neoplasm of bronchus and lung: Secondary | ICD-10-CM

## 2012-02-06 DIAGNOSIS — M25559 Pain in unspecified hip: Secondary | ICD-10-CM

## 2012-02-06 DIAGNOSIS — J189 Pneumonia, unspecified organism: Secondary | ICD-10-CM

## 2012-02-06 DIAGNOSIS — R509 Fever, unspecified: Secondary | ICD-10-CM

## 2012-02-09 ENCOUNTER — Encounter (HOSPITAL_COMMUNITY): Payer: Self-pay | Admitting: *Deleted

## 2012-02-09 ENCOUNTER — Inpatient Hospital Stay (HOSPITAL_COMMUNITY)
Admission: EM | Admit: 2012-02-09 | Discharge: 2012-02-13 | DRG: 193 | Disposition: A | Discharge status: Home / Self Care | Payer: Medicare Other | Attending: Internal Medicine | Admitting: Internal Medicine

## 2012-02-09 ENCOUNTER — Emergency Department (HOSPITAL_COMMUNITY): Payer: Medicare Other

## 2012-02-09 DIAGNOSIS — J189 Pneumonia, unspecified organism: Principal | ICD-10-CM

## 2012-02-09 DIAGNOSIS — I959 Hypotension, unspecified: Secondary | ICD-10-CM | POA: Diagnosis present

## 2012-02-09 DIAGNOSIS — Z860100 Personal history of colon polyps, unspecified: Secondary | ICD-10-CM

## 2012-02-09 DIAGNOSIS — E860 Dehydration: Secondary | ICD-10-CM

## 2012-02-09 DIAGNOSIS — N200 Calculus of kidney: Secondary | ICD-10-CM

## 2012-02-09 DIAGNOSIS — R7302 Impaired glucose tolerance (oral): Secondary | ICD-10-CM

## 2012-02-09 DIAGNOSIS — R509 Fever, unspecified: Secondary | ICD-10-CM

## 2012-02-09 DIAGNOSIS — I10 Essential (primary) hypertension: Secondary | ICD-10-CM | POA: Diagnosis not present

## 2012-02-09 DIAGNOSIS — Z79899 Other long term (current) drug therapy: Secondary | ICD-10-CM

## 2012-02-09 DIAGNOSIS — D126 Benign neoplasm of colon, unspecified: Secondary | ICD-10-CM

## 2012-02-09 DIAGNOSIS — C3490 Malignant neoplasm of unspecified part of unspecified bronchus or lung: Secondary | ICD-10-CM

## 2012-02-09 DIAGNOSIS — J90 Pleural effusion, not elsewhere classified: Secondary | ICD-10-CM | POA: Diagnosis present

## 2012-02-09 DIAGNOSIS — Z85118 Personal history of other malignant neoplasm of bronchus and lung: Secondary | ICD-10-CM

## 2012-02-09 DIAGNOSIS — Z8546 Personal history of malignant neoplasm of prostate: Secondary | ICD-10-CM

## 2012-02-09 DIAGNOSIS — M069 Rheumatoid arthritis, unspecified: Secondary | ICD-10-CM | POA: Diagnosis present

## 2012-02-09 DIAGNOSIS — R0602 Shortness of breath: Secondary | ICD-10-CM | POA: Diagnosis not present

## 2012-02-09 DIAGNOSIS — E7800 Pure hypercholesterolemia, unspecified: Secondary | ICD-10-CM

## 2012-02-09 DIAGNOSIS — C61 Malignant neoplasm of prostate: Secondary | ICD-10-CM

## 2012-02-09 DIAGNOSIS — I2510 Atherosclerotic heart disease of native coronary artery without angina pectoris: Secondary | ICD-10-CM | POA: Diagnosis not present

## 2012-02-09 DIAGNOSIS — IMO0002 Reserved for concepts with insufficient information to code with codable children: Secondary | ICD-10-CM | POA: Diagnosis not present

## 2012-02-09 DIAGNOSIS — E871 Hypo-osmolality and hyponatremia: Secondary | ICD-10-CM | POA: Diagnosis present

## 2012-02-09 DIAGNOSIS — D696 Thrombocytopenia, unspecified: Secondary | ICD-10-CM | POA: Diagnosis not present

## 2012-02-09 DIAGNOSIS — D649 Anemia, unspecified: Secondary | ICD-10-CM | POA: Diagnosis present

## 2012-02-09 DIAGNOSIS — R918 Other nonspecific abnormal finding of lung field: Secondary | ICD-10-CM | POA: Diagnosis not present

## 2012-02-09 DIAGNOSIS — D72829 Elevated white blood cell count, unspecified: Secondary | ICD-10-CM | POA: Diagnosis present

## 2012-02-09 DIAGNOSIS — R7989 Other specified abnormal findings of blood chemistry: Secondary | ICD-10-CM

## 2012-02-09 DIAGNOSIS — Z87442 Personal history of urinary calculi: Secondary | ICD-10-CM

## 2012-02-09 DIAGNOSIS — N170 Acute kidney failure with tubular necrosis: Secondary | ICD-10-CM | POA: Diagnosis present

## 2012-02-09 DIAGNOSIS — N179 Acute kidney failure, unspecified: Secondary | ICD-10-CM

## 2012-02-09 DIAGNOSIS — Z7982 Long term (current) use of aspirin: Secondary | ICD-10-CM | POA: Diagnosis not present

## 2012-02-09 DIAGNOSIS — N289 Disorder of kidney and ureter, unspecified: Secondary | ICD-10-CM | POA: Diagnosis not present

## 2012-02-09 DIAGNOSIS — R944 Abnormal results of kidney function studies: Secondary | ICD-10-CM | POA: Diagnosis not present

## 2012-02-09 DIAGNOSIS — I517 Cardiomegaly: Secondary | ICD-10-CM | POA: Diagnosis not present

## 2012-02-09 LAB — LACTIC ACID, PLASMA: Lactic Acid, Venous: 1.1 mmol/L (ref 0.5–2.2)

## 2012-02-09 LAB — URINE MICROSCOPIC-ADD ON

## 2012-02-09 LAB — URINALYSIS, ROUTINE W REFLEX MICROSCOPIC
Ketones, ur: NEGATIVE mg/dL
Leukocytes, UA: NEGATIVE
Nitrite: NEGATIVE
Protein, ur: 100 mg/dL — AB
Urobilinogen, UA: 1 mg/dL (ref 0.0–1.0)

## 2012-02-09 LAB — CBC
HCT: 34 % — ABNORMAL LOW (ref 39.0–52.0)
MCH: 32.9 pg (ref 26.0–34.0)
MCV: 97.1 fL (ref 78.0–100.0)
Platelets: 180 10*3/uL (ref 150–400)
RBC: 3.5 MIL/uL — ABNORMAL LOW (ref 4.22–5.81)

## 2012-02-09 LAB — COMPREHENSIVE METABOLIC PANEL
AST: 16 U/L (ref 0–37)
BUN: 33 mg/dL — ABNORMAL HIGH (ref 6–23)
CO2: 20 mEq/L (ref 19–32)
Calcium: 8.9 mg/dL (ref 8.4–10.5)
Creatinine, Ser: 1.96 mg/dL — ABNORMAL HIGH (ref 0.50–1.35)
GFR calc Af Amer: 38 mL/min — ABNORMAL LOW (ref >90)
GFR calc non Af Amer: 33 mL/min — ABNORMAL LOW (ref >90)
Glucose, Bld: 126 mg/dL — ABNORMAL HIGH (ref 70–99)

## 2012-02-09 LAB — MRSA PCR SCREENING: MRSA by PCR: NEGATIVE

## 2012-02-09 LAB — SEDIMENTATION RATE: Sed Rate: 118 mm/hr — ABNORMAL HIGH (ref 0–16)

## 2012-02-09 MED ORDER — HEPARIN BOLUS VIA INFUSION
4000.0000 [IU] | Freq: Once | INTRAVENOUS | Status: AC
  Administered 2012-02-09: 4000 [IU] via INTRAVENOUS
  Filled 2012-02-09: qty 4000

## 2012-02-09 MED ORDER — HEPARIN (PORCINE) IN NACL 100-0.45 UNIT/ML-% IJ SOLN
1350.0000 [IU]/h | INTRAMUSCULAR | Status: DC
  Administered 2012-02-09: 1150 [IU]/h via INTRAVENOUS
  Filled 2012-02-09 (×2): qty 250

## 2012-02-09 MED ORDER — ACETAMINOPHEN 325 MG PO TABS
650.0000 mg | ORAL_TABLET | Freq: Once | ORAL | Status: AC
  Administered 2012-02-09: 650 mg via ORAL

## 2012-02-09 MED ORDER — SODIUM CHLORIDE 0.9 % IV BOLUS (SEPSIS)
500.0000 mL | Freq: Once | INTRAVENOUS | Status: AC
  Administered 2012-02-09: 16:00:00 via INTRAVENOUS

## 2012-02-09 MED ORDER — DEXTROSE 5 % IV SOLN
500.0000 mg | INTRAVENOUS | Status: DC
  Administered 2012-02-10 – 2012-02-12 (×3): 500 mg via INTRAVENOUS
  Filled 2012-02-09 (×4): qty 500

## 2012-02-09 MED ORDER — PANTOPRAZOLE SODIUM 40 MG PO TBEC
40.0000 mg | DELAYED_RELEASE_TABLET | Freq: Every day | ORAL | Status: DC
  Administered 2012-02-10 – 2012-02-13 (×4): 40 mg via ORAL
  Filled 2012-02-09 (×4): qty 1

## 2012-02-09 MED ORDER — ROSUVASTATIN CALCIUM 20 MG PO TABS
20.0000 mg | ORAL_TABLET | Freq: Every day | ORAL | Status: DC
  Administered 2012-02-10 – 2012-02-13 (×4): 20 mg via ORAL
  Filled 2012-02-09 (×4): qty 1

## 2012-02-09 MED ORDER — OXYCODONE HCL 5 MG PO TABS: 5.0000 mg | ORAL_TABLET | ORAL | Status: DC | PRN

## 2012-02-09 MED ORDER — NITROGLYCERIN 0.4 MG SL SUBL: 0.4000 mg | SUBLINGUAL_TABLET | SUBLINGUAL | Status: DC | PRN

## 2012-02-09 MED ORDER — SODIUM CHLORIDE 0.9 % IV BOLUS (SEPSIS)
1000.0000 mL | Freq: Once | INTRAVENOUS | Status: AC
  Administered 2012-02-09: 1000 mL via INTRAVENOUS

## 2012-02-09 MED ORDER — ONDANSETRON HCL 4 MG PO TABS: 4.0000 mg | ORAL_TABLET | Freq: Four times a day (QID) | ORAL | Status: DC | PRN

## 2012-02-09 MED ORDER — IPRATROPIUM BROMIDE 0.02 % IN SOLN: 0.5000 mg | RESPIRATORY_TRACT | Status: DC | PRN

## 2012-02-09 MED ORDER — DEXTROSE 5 % IV SOLN
1.0000 g | INTRAVENOUS | Status: DC
  Administered 2012-02-10 – 2012-02-12 (×3): 1 g via INTRAVENOUS
  Filled 2012-02-09 (×4): qty 10

## 2012-02-09 MED ORDER — AZITHROMYCIN 500 MG IV SOLR
500.0000 mg | Freq: Once | INTRAVENOUS | Status: AC
  Administered 2012-02-09: 500 mg via INTRAVENOUS
  Filled 2012-02-09: qty 500

## 2012-02-09 MED ORDER — ACETAMINOPHEN 325 MG PO TABS
650.0000 mg | ORAL_TABLET | Freq: Four times a day (QID) | ORAL | Status: DC | PRN
  Administered 2012-02-10: 650 mg via ORAL
  Filled 2012-02-09: qty 2

## 2012-02-09 MED ORDER — ALPRAZOLAM 0.25 MG PO TABS
0.2500 mg | ORAL_TABLET | Freq: Three times a day (TID) | ORAL | Status: DC | PRN
  Administered 2012-02-10: 0.25 mg via ORAL
  Filled 2012-02-09: qty 1

## 2012-02-09 MED ORDER — ONDANSETRON HCL 4 MG/2ML IJ SOLN: 4.0000 mg | Freq: Four times a day (QID) | INTRAMUSCULAR | Status: DC | PRN

## 2012-02-09 MED ORDER — ALBUTEROL SULFATE (5 MG/ML) 0.5% IN NEBU: 2.5000 mg | INHALATION_SOLUTION | RESPIRATORY_TRACT | Status: DC | PRN

## 2012-02-09 MED ORDER — SODIUM CHLORIDE 0.9 % IV BOLUS (SEPSIS)
1000.0000 mL | Freq: Once | INTRAVENOUS | Status: AC
  Administered 2012-02-10: 1000 mL via INTRAVENOUS

## 2012-02-09 MED ORDER — ACETAMINOPHEN 325 MG PO TABS
ORAL_TABLET | ORAL | Status: AC
  Administered 2012-02-09: 650 mg via ORAL
  Filled 2012-02-09: qty 2

## 2012-02-09 MED ORDER — ACETAMINOPHEN 650 MG RE SUPP: 650.0000 mg | Freq: Four times a day (QID) | RECTAL | Status: DC | PRN

## 2012-02-09 MED ORDER — DEXTROSE 5 % IV SOLN
1.0000 g | Freq: Once | INTRAVENOUS | Status: AC
  Administered 2012-02-09: 1 g via INTRAVENOUS
  Filled 2012-02-09: qty 10

## 2012-02-09 MED ORDER — SODIUM CHLORIDE 0.9 % IV BOLUS (SEPSIS): 2000.0000 mL | Freq: Once | INTRAVENOUS | Status: DC

## 2012-02-09 MED ORDER — MORPHINE SULFATE 2 MG/ML IJ SOLN: 2.0000 mg | INTRAMUSCULAR | Status: DC | PRN

## 2012-02-09 MED ORDER — SODIUM CHLORIDE 0.9 % IV SOLN
INTRAVENOUS | Status: DC
  Administered 2012-02-10: 21:00:00 via INTRAVENOUS
  Administered 2012-02-10: 1000 mL via INTRAVENOUS

## 2012-02-09 NOTE — ED Notes (Signed)
Pt states he has not felt well since Sunday, he has had a fever since that time with decreased appetite. States he has not been able to eat since Sunday

## 2012-02-09 NOTE — ED Notes (Signed)
Attempted to call report to receiving RN, RN unavailable at this time, will try to call back

## 2012-02-09 NOTE — ED Notes (Signed)
C/o generalized weakness, malaise, painful joints "all over", fever, no appetite x 1 week. Saw PCP for same on Monday, started on prednisone. Saw a rheumatologist Tuesday, had blood drawn.  Denies n/v/d, cold, cough.

## 2012-02-09 NOTE — ED Provider Notes (Signed)
History     CSN: [id number]  Arrival date & time 02/09/12  1310   First MD Initiated Contact with Patient 02/09/12 1451      Chief Complaint  Patient presents with  . Fever    (Consider location/radiation/quality/duration/timing/severity/associated sxs/prior treatment) Patient is a 71 y.o. male presenting with general illness. The history is provided by the patient and a significant other.  Illness  The current episode started 5 to 7 days ago. The problem occurs continuously. The problem has been gradually worsening. The problem is moderate. The symptoms are relieved by nothing. The symptoms are aggravated by nothing. Associated symptoms include a fever, diarrhea (single episode today) and muscle aches. Pertinent negatives include no abdominal pain, no nausea, no vomiting, no headaches, no sore throat, no neck pain, no cough and no rash. He has been less active. He has been drinking less than usual and eating less than usual. The last void occurred less than 6 hours ago. There were no sick contacts. Recently, medical care has been given by the PCP. Services received include tests performed and one or more referrals.    Past Medical History  Diagnosis Date  . Hypertension   . Hypercholesteremia   . Glucose intolerance (impaired glucose tolerance)   . Kidney stones, calcium oxalate   . CAD (coronary artery disease)   . 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 Adenomatous colon polyp     Dr. Madilyn Fireman  . Prostate cancer 2009    Dr. Hillis Range; tx w/ radium implants    Past Surgical History  Procedure Date  . Lobectomy 1999    left - Dr. Laneta Simmers  . Repair right testicular torsion 1962  . Cardiac catheterization 1997    Dr. [place]  . Prostate radium implants 2009  . Lung cancer surgery 2003  . Colonoscopy ;11/12/99;04/29/03    12/27/09    Family History  Problem Relation Age of Onset  . Heart disease Mother   . Hypertension Mother   . Heart disease Father   .  Hypertension Father   . Stroke Father   . Other Brother     spinal meningitis  . Other Brother     MVA  . Cancer Sister     stomach    History  Substance Use Topics  . Smoking status: Former Smoker    Types: Cigarettes    Quit date: 10/17/1982  . Smokeless tobacco: Current User    Types: Chew  . Alcohol Use: 3.5 oz/week    7 drink(s) per week     occassionally       Review of Systems  Constitutional: Positive for fever, chills, activity change, appetite change and fatigue.  HENT: Negative for sore throat and neck pain.   Respiratory: Negative for cough, chest tightness and shortness of breath.   Cardiovascular: Positive for chest pain (single episode 2 days prior).  Gastrointestinal: Positive for diarrhea (single episode today). Negative for nausea, vomiting and abdominal pain.  Musculoskeletal: Positive for arthralgias (BL, symmetric, diffuse).  Skin: Negative for rash.  Neurological: Negative for numbness and headaches.  All other systems reviewed and are negative.    Allergies  Review of patient's allergies indicates no known allergies.  Home Medications   Current Outpatient Rx  Name Route Sig Dispense Refill  . ALPRAZOLAM 0.25 MG PO TABS Oral Take 0.25 mg by mouth 3 (three) times daily as needed. As needed for anxiety.    . ASPIRIN EC 81 MG PO TBEC  Oral Take 81 mg by mouth daily.      . ATORVASTATIN CALCIUM 20 MG PO TABS Oral Take 40 mg by mouth daily.     Marland Kitchen VITAMIN B 12 PO Oral Take 1,000 mcg by mouth daily.      Marland Kitchen GLUCOSAMINE-CHONDROITIN 500-400 MG PO TABS Oral Take 1 tablet by mouth 2 (two) times daily.      Marland Kitchen LISINOPRIL 10 MG PO TABS Oral Take 10 mg by mouth daily.      Marland Kitchen OMEPRAZOLE 20 MG PO CPDR Oral Take 20 mg by mouth daily.    Marland Kitchen PREDNISONE 10 MG PO TABS Oral Take 30 mg by mouth daily with breakfast. For 21 days. Patient started on 02/06/12.    Marland Kitchen TRIAMTERENE-HCTZ 37.5-25 MG PO TABS Oral Take 1 tablet by mouth daily.      Marland Kitchen VERAPAMIL HCL 240 MG PO TBCR  Oral Take 240 mg by mouth 2 (two) times daily.      Marland Kitchen DICLOFENAC SODIUM 1 % TD GEL Topical Apply topically 4 (four) times daily as needed. As needed for muscle pain.    Marland Kitchen NITROGLYCERIN 0.4 MG SL SUBL Sublingual Place 0.4 mg under the tongue every 5 (five) minutes as needed.        BP 95/46  Pulse 68  Temp(Src) 101.3 F (38.5 C) (Oral)  Resp 16  SpO2 95%  Physical Exam  Constitutional: He is oriented to person, place, and time. He appears well-developed and well-nourished.  HENT:  Head: Normocephalic and atraumatic.  Right Ear: External ear normal.  Left Ear: External ear normal.  Eyes: EOM are normal.  Neck: Normal range of motion.  Cardiovascular: Normal rate, regular rhythm and normal heart sounds.   Pulmonary/Chest: Effort normal and breath sounds normal. No respiratory distress. He has no wheezes. He exhibits no tenderness.  Abdominal: Soft. There is no tenderness. There is no rebound and no guarding.  Musculoskeletal: Normal range of motion. He exhibits no edema.  Neurological: He is alert and oriented to person, place, and time.  Skin: Skin is warm and dry. No rash noted.  Psychiatric: He has a normal mood and affect.    ED Course  Procedures (including critical care time)  Labs Reviewed  CBC - Abnormal; Notable for the following:    WBC 11.9 (*)    RBC 3.50 (*)    Hemoglobin 11.5 (*)    HCT 34.0 (*)    All other components within normal limits  COMPREHENSIVE METABOLIC PANEL - Abnormal; Notable for the following:    Sodium 130 (*)    Chloride 95 (*)    Glucose, Bld 126 (*)    BUN 33 (*)    Creatinine, Ser 1.96 (*)    Albumin 3.2 (*)    GFR calc non Af Amer 33 (*)    GFR calc Af Amer 38 (*)    All other components within normal limits  SEDIMENTATION RATE - Abnormal; Notable for the following:    Sed Rate 118 (*)    All other components within normal limits  URINALYSIS, ROUTINE W REFLEX MICROSCOPIC - Abnormal; Notable for the following:    Color, Urine AMBER  (*) BIOCHEMICALS MAY BE AFFECTED BY COLOR   APPearance CLOUDY (*)    Hgb urine dipstick SMALL (*)    Protein, ur 100 (*)    All other components within normal limits  URINE MICROSCOPIC-ADD ON - Abnormal; Notable for the following:    Casts GRANULAR CAST (*)  All other components within normal limits  LACTIC ACID, PLASMA  URINE CULTURE  CULTURE, BLOOD (ROUTINE X 2)  CULTURE, BLOOD (ROUTINE X 2)   Dg Chest 2 View  02/09/2012  *RADIOLOGY REPORT*  Clinical Data: Fever  CHEST - 2 VIEW  Comparison: 02/06/2012  Findings: Moderate cardiomegaly.  Right lung clear.  Wedge-shaped focal opacity has developed in the left peripheral midlung zone. Small left pleural effusion.  No pneumothorax.  IMPRESSION: New wedge shaped the left midlung peripheral opacity worrisome for peripheral pneumonia or pulmonary infarct.  Original Report Authenticated By: Donavan Burnet, M.D.     1. Community acquired pneumonia   2. Fever   3. Renal insufficiency       MDM    Pt presents with 1 week a fatigue, malaise. Has had fever/chills for 4 days.  Single day of L sided chest pain 2 day ago with deep breaths, has since resolved.  Also c/o diffuse joint pains without swelling. Seen by PCP 3 day ago and started on steroids. Seen at rheum 2 day ago, as well.   Here pt febrile and hypotensive, but well appearing. Not tachycardic, but on a CCB. Mentating well. Giving IVF. XR c/f PNA, giving Abx. Blood cx ordered from Triage. Lactate also added.   No high concern of PE at this time, given his AKI with GFR in 30s needs hydrated prior to contrasted study. Do not feel empiric anticoagulation indicated.  No joints suggestive of septic arthritis.    Pt given 3L IVF in ED. His BP remained around 100/60.  Low, but adequate MAP. Also with nl lactate which is reassuring. Remained with normal mental status.   He did take his morning BP meds today.  Admitted to hospitalist.      Donnamarie Poag, MD 02/09/12 1946

## 2012-02-09 NOTE — ED Notes (Signed)
Patient states that he started having general weakness and joint pain on Sunday.  Pt went to PCP on Monday and was sent for further evaluation of joint pain and swelling to another MD on Tuesday.  Pt states that he was started on prednisone on Tuesday.  Pt states that he has cont to have increase in weakness and pain. To his entire body.  Pt states that he has been unable to eat all week. Pt denies any n/v.  Pt states that he did have one episode of diarrhea this morning.  Pt does have noted swelling to his bilateral hands.  Pt appears to be weak and not feel well.

## 2012-02-09 NOTE — H&P (Signed)
PCP:   Hollice Espy, MD, MD   Chief Complaint:  Fever, malaise, generalized aches and  fatigue for a few days.  HPI: This is a 71 year male, with known history of HTN, dyslipidemia, CAD, s/p cardiac catheterization 02/11/11, revealing occlusive disease of distal RCA, recommended medical management only, EF 55% at the time, urolithiasis, adenocarcinoma of prostate, s/p brachytherapy 06/2008, carcinoma of lung, diagnosed over 10 years ago, s/p left lower lobectomy by Dr Laneta Simmers, presenting with above symptoms. Apparently, he had felt unwell for about 4 days, had some peft-sided pleuritic type chest pain on 02/05/12, without cough, but with mild SOB, and fever. He went to see his PMD on 02/06/12, and was prescribed Prednisone (30 mg daily) due to non-specific nature of his complaint, on suspicion of rheumatologic disease. He was seen by the Rheumatologist Dr Judith Blonder on on 02/07/12 by referral from his PMD, and Prednisone reduced to 20 mg daily. A follow up appointment was made for 02/20/12. His PMD called today to check on him, and because of continuing symptoms, recommended ED visit. His family brought him to ED.  Allergies:  No Known Allergies    Past Medical History  Diagnosis Date  . Hypertension   . Hypercholesteremia   . Glucose intolerance (impaired glucose tolerance)   . Kidney stones, calcium oxalate   . CAD (coronary artery disease)   . 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 Adenomatous colon polyp     Dr. Madilyn Fireman  . Prostate cancer 2009    Dr. Hillis Range; tx w/ radium implants    Past Surgical History  Procedure Date  . Lobectomy 1999    left - Dr. Laneta Simmers  . Repair right testicular torsion 1962  . Cardiac catheterization 1997    Dr. [place]  . Prostate radium implants 2009  . Lung cancer surgery 2003  . Colonoscopy ;11/12/99;04/29/03    12/27/09    Prior to Admission medications   Medication Sig Start Date End Date Taking? Authorizing Provider  ALPRAZolam (XANAX) 0.25  MG tablet Take 0.25 mg by mouth 3 (three) times daily as needed. As needed for anxiety.   Yes Oneita Hurt, MD  aspirin EC 81 MG tablet Take 81 mg by mouth daily.     Yes Historical Provider, MD  atorvastatin (LIPITOR) 20 MG tablet Take 40 mg by mouth daily.    Yes Historical Provider, MD  Cyanocobalamin (VITAMIN B 12 PO) Take 1,000 mcg by mouth daily.     Yes Historical Provider, MD  glucosamine-chondroitin 500-400 MG tablet Take 1 tablet by mouth 2 (two) times daily.     Yes Historical Provider, MD  lisinopril (PRINIVIL,ZESTRIL) 10 MG tablet Take 10 mg by mouth daily.     Yes Historical Provider, MD  omeprazole (PRILOSEC) 20 MG capsule Take 20 mg by mouth daily.   Yes Historical Provider, MD  predniSONE (DELTASONE) 10 MG tablet Take 30 mg by mouth daily with breakfast. For 21 days. Patient started on 02/06/12.   Yes Historical Provider, MD  triamterene-hydrochlorothiazide (MAXZIDE-25) 37.5-25 MG per tablet Take 1 tablet by mouth daily.     Yes Historical Provider, MD  verapamil (CALAN-SR) 240 MG CR tablet Take 240 mg by mouth 2 (two) times daily.     Yes Historical Provider, MD  diclofenac sodium (VOLTAREN) 1 % GEL Apply topically 4 (four) times daily as needed. As needed for muscle pain.    Historical Provider, MD  nitroGLYCERIN (NITROSTAT) 0.4 MG SL tablet Place 0.4  mg under the tongue every 5 (five) minutes as needed.      Historical Provider, MD    Social History:  reports that he quit smoking about 29 years ago. His smoking use included Cigarettes. His smokeless tobacco use includes Chew. He reports that he drinks about 3.5 ounces of alcohol per week. He reports that he does not use illicit drugs.  Family History  Problem Relation Age of Onset  . Heart disease Mother   . Hypertension Mother   . Heart disease Father   . Hypertension Father   . Stroke Father   . Other Brother     spinal meningitis  . Other Brother     MVA  . Cancer Sister     stomach    Review of Systems:    As per HPI and chief complaint. Patent denies weight loss, headache, blurred vision, difficulty in speaking, dysphagia, cough, orthopnea, paroxysmal nocturnal dyspnea, nausea, diaphoresis, abdominal pain, vomiting, diarrhea, belching, heartburn, hematemesis, melena, dysuria, nocturia, urinary frequency, hematochezia, lower extremity swelling, pain, or redness. The rest of the systems review is negative.  Physical Exam: General:  Patient does not appear to be in obvious acute distress. Alert, communicative, fully oriented, talking in complete sentences, not short of breath at rest. Mildly sweaty. HEENT:  Looks flushed, mild clinical pallor, no jaundice, no conjunctival injection or discharge. Visible buccal mucosa is "dry".  NECK:  Supple, JVP not seen, no carotid bruits, no palpable lymphadenopathy, no palpable goiter. CHEST:  Clinically clear to auscultation, no wheezes, no crackles. Old left thoracotomy scar is seen, and is unremarkable. HEART:  Sounds 1 and 2 heard, normal, regular, no murmurs. ABDOMEN:  Full, soft, non-tender, no palpable organomegaly, no palpable masses, normal bowel sounds. GENITALIA:  Not examined. LOWER EXTREMITIES:  No pitting edema, palpable peripheral pulses. MUSCULOSKELETAL SYSTEM:  Generalized osteoarthritic changes, otherwise, normal. CENTRAL NERVOUS SYSTEM:  No focal neurologic deficit on gross examination.  Labs on Admission:  Results for orders placed during the hospital encounter of 02/09/12 (from the past 48 hour(s))  CBC     Status: Abnormal   Collection Time   02/09/12  3:08 PM      Component Value Range Comment   WBC 11.9 (*) 4.0 - 10.5 (K/uL)    RBC 3.50 (*) 4.22 - 5.81 (MIL/uL)    Hemoglobin 11.5 (*) 13.0 - 17.0 (g/dL)    HCT 16.1 (*) 09.6 - 52.0 (%)    MCV 97.1  78.0 - 100.0 (fL)    MCH 32.9  26.0 - 34.0 (pg)    MCHC 33.8  30.0 - 36.0 (g/dL)    RDW 04.5  40.9 - 81.1 (%)    Platelets 180  150 - 400 (K/uL)   COMPREHENSIVE METABOLIC PANEL      Status: Abnormal   Collection Time   02/09/12  3:08 PM      Component Value Range Comment   Sodium 130 (*) 135 - 145 (mEq/L)    Potassium 4.5  3.5 - 5.1 (mEq/L)    Chloride 95 (*) 96 - 112 (mEq/L)    CO2 20  19 - 32 (mEq/L)    Glucose, Bld 126 (*) 70 - 99 (mg/dL)    BUN 33 (*) 6 - 23 (mg/dL)    Creatinine, Ser 9.14 (*) 0.50 - 1.35 (mg/dL)    Calcium 8.9  8.4 - 10.5 (mg/dL)    Total Protein 7.6  6.0 - 8.3 (g/dL)    Albumin 3.2 (*) 3.5 - 5.2 (g/dL)  AST 16  0 - 37 (U/L)    ALT 11  0 - 53 (U/L)    Alkaline Phosphatase 46  39 - 117 (U/L)    Total Bilirubin 0.8  0.3 - 1.2 (mg/dL)    GFR calc non Af Amer 33 (*) >90 (mL/min)    GFR calc Af Amer 38 (*) >90 (mL/min)   SEDIMENTATION RATE     Status: Abnormal   Collection Time   02/09/12  3:08 PM      Component Value Range Comment   Sed Rate 118 (*) 0 - 16 (mm/hr)   URINALYSIS, ROUTINE W REFLEX MICROSCOPIC     Status: Abnormal   Collection Time   02/09/12  3:38 PM      Component Value Range Comment   Color, Urine AMBER (*) YELLOW  BIOCHEMICALS MAY BE AFFECTED BY COLOR   APPearance CLOUDY (*) CLEAR     Specific Gravity, Urine 1.017  1.005 - 1.030     pH 5.5  5.0 - 8.0     Glucose, UA NEGATIVE  NEGATIVE (mg/dL)    Hgb urine dipstick SMALL (*) NEGATIVE     Bilirubin Urine NEGATIVE  NEGATIVE     Ketones, ur NEGATIVE  NEGATIVE (mg/dL)    Protein, ur 161 (*) NEGATIVE (mg/dL)    Urobilinogen, UA 1.0  0.0 - 1.0 (mg/dL)    Nitrite NEGATIVE  NEGATIVE     Leukocytes, UA NEGATIVE  NEGATIVE    URINE MICROSCOPIC-ADD ON     Status: Abnormal   Collection Time   02/09/12  3:38 PM      Component Value Range Comment   RBC / HPF 0-2  <3 (RBC/hpf)    Casts GRANULAR CAST (*) NEGATIVE     Urine-Other AMORPHOUS URATES/PHOSPHATES     LACTIC ACID, PLASMA     Status: Normal   Collection Time   02/09/12  5:45 PM      Component Value Range Comment   Lactic Acid, Venous 1.1  0.5 - 2.2 (mmol/L)     Radiological Exams on Admission: *RADIOLOGY REPORT*    Clinical Data: Fever  CHEST - 2 VIEW  Comparison: 02/06/2012  Findings: Moderate cardiomegaly. Right lung clear. Wedge-shaped  focal opacity has developed in the left peripheral midlung zone.  Small left pleural effusion. No pneumothorax.  IMPRESSION:  New wedge shaped the left midlung peripheral opacity worrisome for  peripheral pneumonia or pulmonary infarct.   Assessment/Plan Principal Problem:  *Community acquired pneumonia: Patient presents with fever, chills, malaise and generalized aches, with a mild leukocytosis and new CXR opacity, which was not seen on CXR of 02/06/12. Clinically, this appears consistent with community-acquired pneumonia, however, the wedge-shaped nature of the X-Ray opacity, is concerning for a pulmonary infarct, although patient has no hemoptysis. Unfortunately, patient's elevated creatinine militates against doing a chest CT angiogram. We shall therefore, commence treatment with iv Rocehin/Azithromycin, arrange a V/Q scan, and pending that, commence iv Heparin. Patient will need close monitoring until there issue is resolved, so admission will be to SDU initially. Active Problems:  1. Dehydration: Patient presented with a BUN of 33, creatinine of 1.96, and dry mucosae, consistent with dehydration, against a background of poor oral intake and diuretic therapy. We shall  Manage with iv fluid hydration. Improvement is anticipated  2. Hypotension: this is secondary to #1 above, against a background of continuing antihypertensive therapy. He has already been bolused with iv fluids in the ED. IV fluids will be continued. Of course, all pre-admission  antihypertensives will be placed on hold.   3. AKI (acute kidney injury): As described above, BUN is 33, creatinine is 1.96. Baseline creatinine was 1.0, as of 01/2011. These findings are consistent with acute kidney injury, likely pre-renal, due to dehydration and volume depletion, although hypotension may be contributory. We  shall hold ACE-i, diuretics, NSAIDS, and avoid any further nephrotoxins. Given his history of prostate Cancer, and urolithiasis, a renal ultra-sound will be arranged. 4. CAD: Known to have single-vessel RCA disease with good collaterals and preserved EF. Currently stable on medical management. 5. Prostate cancer: Patient has been stable from this viewpoint, following Brachytherapy in 2009, and follows up with Dr Retta Diones.  Further management will depend on clinical course.  Time Spent on Admission: 1 hour.  Kahleb Mcclane,CHRISTOPHER 02/09/2012, 7:14 PM

## 2012-02-09 NOTE — Progress Notes (Signed)
ANTICOAGULATION CONSULT NOTE - Initial Consult  Pharmacy Consult for Heparin Indication: pulmonary embolus (rule out; V/Q scan ordered)  No Known Allergies  Patient Measurements:   Weight ~ 80 kg Height ~ 67 inches IBW ~ 66 kg  Heparin Dosing Weight: 80 kg  Vital Signs: Temp: 98.4 F (36.9 C) (04/25 2045) Temp src: Oral (04/25 2045) BP: 90/52 mmHg (04/25 2045) Pulse Rate: 60  (04/25 2045)  Labs:  Basename 02/09/12 1508  HGB 11.5*  HCT 34.0*  PLT 180  APTT --  LABPROT --  INR --  HEPARINUNFRC --  CREATININE 1.96*  CKTOTAL --  CKMB --  TROPONINI --   CrCl ~ 35 ml/min  Medical History: Past Medical History  Diagnosis Date  . Hypertension   . Hypercholesteremia   . Glucose intolerance (impaired glucose tolerance)   . Kidney stones, calcium oxalate   . CAD (coronary artery disease)   . 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 Adenomatous colon polyp     Dr. Madilyn Fireman  . Prostate cancer 2009    Dr. Hillis Range; tx w/ radium implants    Medications:  Prescriptions prior to admission  Medication Sig Dispense Refill  . ALPRAZolam (XANAX) 0.25 MG tablet Take 0.25 mg by mouth 3 (three) times daily as needed. As needed for anxiety.      Marland Kitchen aspirin EC 81 MG tablet Take 81 mg by mouth daily.        Marland Kitchen atorvastatin (LIPITOR) 20 MG tablet Take 40 mg by mouth daily.       . Cyanocobalamin (VITAMIN B 12 PO) Take 1,000 mcg by mouth daily.        Marland Kitchen glucosamine-chondroitin 500-400 MG tablet Take 1 tablet by mouth 2 (two) times daily.        Marland Kitchen lisinopril (PRINIVIL,ZESTRIL) 10 MG tablet Take 10 mg by mouth daily.        Marland Kitchen omeprazole (PRILOSEC) 20 MG capsule Take 20 mg by mouth daily.      . predniSONE (DELTASONE) 10 MG tablet Take 30 mg by mouth daily with breakfast. For 21 days. Patient started on 02/06/12.      Marland Kitchen triamterene-hydrochlorothiazide (MAXZIDE-25) 37.5-25 MG per tablet Take 1 tablet by mouth daily.        . verapamil (CALAN-SR) 240 MG CR tablet Take 240 mg by  mouth 2 (two) times daily.        . diclofenac sodium (VOLTAREN) 1 % GEL Apply topically 4 (four) times daily as needed. As needed for muscle pain.      . nitroGLYCERIN (NITROSTAT) 0.4 MG SL tablet Place 0.4 mg under the tongue every 5 (five) minutes as needed.          Assessment: 71 yo M presented to ER 02/09/2012 with fever, malaise, generalized aches and pains since Sunday 4/21.  Pt was started on Prednisone outpatient for presumed rheumatologic disease.  Since there was no improvement, the patient was advised to come to the ER.  CXR shows new opacity consistent with CAP.  Howrever, there is also concern for PE.  Unfortunately the patient is not appropriate for CT scan with elevated SCR (CrCl ~ 35).  Plan for V/Q scan to r/o PE.   Goal of Therapy:  Heparin level 0.3-0.7 units/ml   Plan:  Heparin 4000 units IV bolus IV x 1 dose. Heparin infusion at 1150 units/hr. Heparin level in 6 hours. Heparin level and CBC daily while on heparin. Follow-up results for V/Q scan.  [employer], Pharm.D., BCPS Clinical Pharmacist Pager [phone] 02/09/2012 10:10 PM

## 2012-02-09 NOTE — ED Provider Notes (Signed)
Medical screening exam- pt presenting with fever, diffuse joint pains, decreased appetite and generalized fatigue. He was seen this week by PMD and rheumatology and started on prednisone.  He states fever today 103.4, pt awake, alert, lungs CTA, CV rrr, abdomen nontender, ND, NABS.  Labs including cultures, CXR, urine, IV ordered- pt to be moved to main ED for further evalauation.   Ethelda Chick, MD 02/09/12 [phone]

## 2012-02-09 NOTE — Consult Note (Signed)
Hospital Consult Note Date: 02/09/2012  Patient name: Daniel Vega Medical record number: [id number] Date of birth: 04/20/1941 Age: 71 y.o. Gender: male PCP: Hollice Espy, MD, MD  Medical Service: PCCM/ Critical Care Attending name: Dr. Kendrick Fries  Reason for consult: Eval for hypotension  Lines, Tubes, etc: none  Microbiology: Urine culture Blood culture  Antibiotics:  Azithromycin 4/25 Ceftriaxone 4/25  Studies/Events: CXR with wedge shaped white area  Consults:  [facility] is primary, PCCM is consult  History of Present Illness: Daniel Vega is a pleasant 71 y/o male with a history of lung cancer, prostate cancer and CAD who presented to the ED on 02/09/2012 with several days of fatigue, dyspnea and chest pain.  He has been experiencing fatigue for several weeks.  Five days prior to admission he noted swelling in his extremities and a purple change in the skin of his R leg with associated pain in his R leg and left chest.  He was seen by his PCP who ordered labwork and a CXR and referred him to Rheumatology.  He was started on prednisone for the chest pain.  He said that the prednisone made him feel great at first but his weakness and shortness of breath persisted.  Rheumatology evidently felt that their was no clear evidence of Rheumatologic disease (however I don't have the documentation to review).  His leg swelling has improved.  He presented to the ED on 4/25 for worsening fever, chills, chest pain, and shortness of breath.  He developed a dry cough after admission and PCCM was consulted (by [facility]) for persistently low BP.   Meds: Medications Prior to Admission  Medication Sig Dispense Refill  . ALPRAZolam (XANAX) 0.25 MG tablet Take 0.25 mg by mouth 3 (three) times daily as needed. As needed for anxiety.      Marland Kitchen aspirin EC 81 MG tablet Take 81 mg by mouth daily.        Marland Kitchen atorvastatin (LIPITOR) 20 MG tablet Take 40 mg by mouth daily.       . Cyanocobalamin (VITAMIN B 12 PO)  Take 1,000 mcg by mouth daily.        Marland Kitchen glucosamine-chondroitin 500-400 MG tablet Take 1 tablet by mouth 2 (two) times daily.        Marland Kitchen lisinopril (PRINIVIL,ZESTRIL) 10 MG tablet Take 10 mg by mouth daily.        Marland Kitchen omeprazole (PRILOSEC) 20 MG capsule Take 20 mg by mouth daily.      . predniSONE (DELTASONE) 10 MG tablet Take 30 mg by mouth daily with breakfast. For 21 days. Patient started on 02/06/12.      Marland Kitchen triamterene-hydrochlorothiazide (MAXZIDE-25) 37.5-25 MG per tablet Take 1 tablet by mouth daily.        . verapamil (CALAN-SR) 240 MG CR tablet Take 240 mg by mouth 2 (two) times daily.        . diclofenac sodium (VOLTAREN) 1 % GEL Apply topically 4 (four) times daily as needed. As needed for muscle pain.      . nitroGLYCERIN (NITROSTAT) 0.4 MG SL tablet Place 0.4 mg under the tongue every 5 (five) minutes as needed.          Allergies: Review of patient's allergies indicates no known allergies. Past Medical History  Diagnosis Date  . Hypertension   . Hypercholesteremia   . Glucose intolerance (impaired glucose tolerance)   . Kidney stones, calcium oxalate   . CAD (coronary artery disease)   . 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 Adenomatous colon polyp     Dr. Madilyn Fireman  . Prostate cancer 2009    Dr. Hillis Range; tx w/ radium implants   Past Surgical History  Procedure Date  . Lobectomy 1999    left - Dr. Laneta Simmers  . Repair right testicular torsion 1962  . Cardiac catheterization 1997    Dr. [place]  . Prostate radium implants 2009  . Lung cancer surgery 2003  . Colonoscopy ;11/12/99;04/29/03    12/27/09   Family History  Problem Relation Age of Onset  . Heart disease Mother   . Hypertension Mother   . Heart disease Father   . Hypertension Father   . Stroke Father   . Other Brother     spinal meningitis  . Other Brother     MVA  . Cancer Sister     stomach   History   Social History  . Marital Status: Married    Spouse Name: N/A    Number of Children: 5    . Years of Education: N/A   Occupational History  . carpenter     [employer]   Social History Main Topics  . Smoking status: Former Smoker    Types: Cigarettes    Quit date: 10/17/1982  . Smokeless tobacco: Current User    Types: Chew  . Alcohol Use: 3.5 oz/week    7 drink(s) per week     occassionally   . Drug Use: No  . Sexually Active: Yes    Birth Control/ Protection: None   Other Topics Concern  . Not on file   Social History Narrative  . No narrative on file    Review of Systems: Gen: Notes recent fever, chills, no weight change, notes some fatigue, night sweats HEENT: Denies blurred vision, double vision, hearing loss, tinnitus, sinus congestion, rhinorrhea, sore throat, neck stiffness, dysphagia PULM: per hpi CV:  Per hpi GI: Denies abdominal pain, nausea, vomiting, diarrhea, hematochezia, melena, constipation, change in bowel habits GU: Denies dysuria, hematuria, polyuria, oliguria, urethral discharge Endocrine: Denies hot or cold intolerance, polyuria, polyphagia or appetite change Derm: Denies rash, dry skin, scaling or peeling skin change Heme: Denies easy bruising, bleeding, bleeding gums Neuro: Denies headache, numbness, weakness, slurred speech, loss of memory or consciousness   Physical Exam: Blood pressure 85/51, pulse 56, temperature 98.2 F (36.8 C), temperature source Oral, resp. rate 18, height 5\' 7"  (1.702 m), weight 182 lb 5.1 oz (82.7 kg), SpO2 96.00%.  General: resting in bed, sleeping, arousable, nad HEENT: PERRL, EOMI, no scleral icterus Cardiac: RRR, S1 S2 nl, no mgr Pulm: air movement heard bilaterally, cta B, ? Crackle L base Abd: soft, nontender, nondistended, BS present Ext: warm and well perfused, no pedal edema Neuro: alert and oriented X4, cranial nerves II-XII grossly intact, able to ambulate without difficulty  Lab results: Basic Metabolic Panel:  Basename 02/09/12 1508  NA 130*  K 4.5  CL 95*  CO2 20  GLUCOSE 126*   BUN 33*  CREATININE 1.96*  CALCIUM 8.9  MG --  PHOS --   Liver Function Tests:  Basename 02/09/12 1508  AST 16  ALT 11  ALKPHOS 46  BILITOT 0.8  PROT 7.6  ALBUMIN 3.2*   CBC:  Basename 02/09/12 1508  WBC 11.9*  NEUTROABS --  HGB 11.5*  HCT 34.0*  MCV 97.1  PLT 180   Imaging results:  Dg Chest 2 View  02/09/2012  *RADIOLOGY REPORT*  Clinical Data: Fever  CHEST - 2 VIEW  Comparison: 02/06/2012  Findings: Moderate cardiomegaly.  Right lung clear.  Wedge-shaped focal opacity has developed in the left peripheral midlung zone. Small left pleural effusion.  No pneumothorax.  IMPRESSION: New wedge shaped the left midlung peripheral opacity worrisome for peripheral pneumonia or pulmonary infarct.  Original Report Authenticated By: Donavan Burnet, M.D.     Assessment & Plan by Problem:   *Community acquired pneumonia versus pulmonary infarction - His history is consistent with both a PE with possible infarct and CAP. (PE a concern given history of leg pain and then pain with breathing).    -->Azithromycin/Ceftriaxone -->Blood cultures pending, urine culture pending -->Heparin drip empirically -->Agree with V/Q scan in the morning as no CT given AKI -->Follow heparin levels, CBC, PT/INR    Dehydration/Hypotension - Likely volume depletion from poor PO intake and infection. Has received 3-4 L at this time, 1 L additional now.  Currently not septic by vitals.  His UOP and mental status are adequate and his lactic acid is normal, all indicating normal perfusion tonight.  Consider underlying adrenal insufficiency given prolonged fatigue, skin changes, hyponatremia.    -->Blood pressure stable now, additional 1 L bolus -->Additional 1 L bolus now -->Prednisone prior to admission so no role to check cortisol, but if hypotension continues would consider usefulness of a cosyntropin stim test to evaluate for adrenal insufficiency   AKI (acute kidney injury) - Cr 1.9 on admission,  baseline 1. Will watch on fluids. Renal US due to hx of urolithiasis and prostate cancer.   -->Renal US -->Follow BMP  CAD - History of medical management, S/P CABG. Stress last in 2006.   History of lung cancer/prostate cancer - Lung cancer with left lobectomy in 90s, prostate s/p brachytherapy in 2009. Currently not in treatment.   Disposition - Pt is stable to stay in SDU, no need for CVC at this time. Will give 1 L bolus at this time.   Best Practice: DVT: heparin drip SUP: protonix PO Nutrition: heart healthy Glycemic control: CBGs Sedation/analgesia: morphine / oxy IR  Signed: Genella Mech 02/09/2012, 11:48 PM   I have seen and examined the patient with nurse practitioner/resident and agree with and have modified the note above.   Yolonda Kida PCCM Pager: [phone] Cell: [phone] If no response, call [phone]

## 2012-02-10 ENCOUNTER — Inpatient Hospital Stay (HOSPITAL_COMMUNITY): Payer: Medicare Other

## 2012-02-10 ENCOUNTER — Encounter (HOSPITAL_COMMUNITY): Payer: Self-pay | Admitting: Radiology

## 2012-02-10 DIAGNOSIS — R7989 Other specified abnormal findings of blood chemistry: Secondary | ICD-10-CM | POA: Diagnosis present

## 2012-02-10 DIAGNOSIS — N179 Acute kidney failure, unspecified: Secondary | ICD-10-CM | POA: Diagnosis not present

## 2012-02-10 DIAGNOSIS — I2510 Atherosclerotic heart disease of native coronary artery without angina pectoris: Secondary | ICD-10-CM | POA: Diagnosis not present

## 2012-02-10 DIAGNOSIS — R509 Fever, unspecified: Secondary | ICD-10-CM

## 2012-02-10 DIAGNOSIS — J189 Pneumonia, unspecified organism: Principal | ICD-10-CM

## 2012-02-10 DIAGNOSIS — R791 Abnormal coagulation profile: Secondary | ICD-10-CM

## 2012-02-10 DIAGNOSIS — E860 Dehydration: Secondary | ICD-10-CM | POA: Diagnosis not present

## 2012-02-10 DIAGNOSIS — D696 Thrombocytopenia, unspecified: Secondary | ICD-10-CM | POA: Diagnosis not present

## 2012-02-10 DIAGNOSIS — I517 Cardiomegaly: Secondary | ICD-10-CM | POA: Diagnosis not present

## 2012-02-10 DIAGNOSIS — J90 Pleural effusion, not elsewhere classified: Secondary | ICD-10-CM | POA: Diagnosis not present

## 2012-02-10 DIAGNOSIS — R944 Abnormal results of kidney function studies: Secondary | ICD-10-CM | POA: Diagnosis not present

## 2012-02-10 DIAGNOSIS — D72829 Elevated white blood cell count, unspecified: Secondary | ICD-10-CM | POA: Diagnosis present

## 2012-02-10 DIAGNOSIS — I10 Essential (primary) hypertension: Secondary | ICD-10-CM | POA: Diagnosis not present

## 2012-02-10 DIAGNOSIS — I959 Hypotension, unspecified: Secondary | ICD-10-CM

## 2012-02-10 LAB — D-DIMER, QUANTITATIVE: D-Dimer, Quant: 1.28 ug/mL-FEU — ABNORMAL HIGH (ref 0.00–0.48)

## 2012-02-10 LAB — COMPREHENSIVE METABOLIC PANEL
Alkaline Phosphatase: 47 U/L (ref 39–117)
Calcium: 7.9 mg/dL — ABNORMAL LOW (ref 8.4–10.5)
Chloride: 103 mEq/L (ref 96–112)
GFR calc Af Amer: 61 mL/min — ABNORMAL LOW (ref >90)
GFR calc non Af Amer: 52 mL/min — ABNORMAL LOW (ref >90)
Sodium: 135 mEq/L (ref 135–145)
Total Protein: 6.6 g/dL (ref 6.0–8.3)

## 2012-02-10 LAB — URINE CULTURE

## 2012-02-10 LAB — CBC
Hemoglobin: 10.8 g/dL — ABNORMAL LOW (ref 13.0–17.0)
MCHC: 33.6 g/dL (ref 30.0–36.0)
Platelets: 171 10*3/uL (ref 150–400)
RDW: 13.8 % (ref 11.5–15.5)

## 2012-02-10 LAB — HEPARIN LEVEL (UNFRACTIONATED)
Heparin Unfractionated: 0.22 IU/mL — ABNORMAL LOW (ref 0.30–0.70)
Heparin Unfractionated: 0.31 IU/mL (ref 0.30–0.70)

## 2012-02-10 MED ORDER — PREDNISONE 20 MG PO TABS
30.0000 mg | ORAL_TABLET | Freq: Every day | ORAL | Status: DC
  Administered 2012-02-11: 30 mg via ORAL
  Filled 2012-02-10 (×2): qty 1

## 2012-02-10 MED ORDER — ASPIRIN EC 81 MG PO TBEC
81.0000 mg | DELAYED_RELEASE_TABLET | Freq: Every day | ORAL | Status: DC
  Administered 2012-02-10 – 2012-02-13 (×4): 81 mg via ORAL
  Filled 2012-02-10 (×5): qty 1

## 2012-02-10 MED ORDER — IOHEXOL 350 MG/ML SOLN
100.0000 mL | Freq: Once | INTRAVENOUS | Status: AC | PRN
  Administered 2012-02-10: 100 mL via INTRAVENOUS

## 2012-02-10 NOTE — Progress Notes (Signed)
[facility]   Subjective: Alert. Has developed significant cough after arrival and rehydration. Also complaining of left chest cramping pleuritic in nature but no pain in chest with breathing. Also endorses 2 episodes of diarrhea since admission. Prior to admission patient had issues with bilateral hand and feet swelling and was referred to an orthopedic physician and was later started on prednisone for present rheumatoid arthritis? Also had bilateral lower extremity swelling especially in the calf greater on the right. This is the same leg patient has had prior vein stripping in and has a history of severe varicose veins. Currently denies shortness of breath.  Objective: Vital signs in last 24 hours: Temp:  [97.8 F (36.6 C)-101.3 F (38.5 C)] 98.1 F (36.7 C) (04/26 1253) Pulse Rate:  [52-83] 80  (04/26 1253) Resp:  [16-22] 22  (04/26 1253) BP: (85-141)/(46-86) 140/73 mmHg (04/26 1253) SpO2:  [93 %-98 %] 98 % (04/26 1253) Weight:  [82.7 kg (182 lb 5.1 oz)-83.5 kg (184 lb 1.4 oz)] 83.5 kg (184 lb 1.4 oz) (04/26 0000) Weight change:  Last BM Date: 02/09/12  Intake/Output from previous day: 04/25 0701 - 04/26 0700 In: 2896 [I.V.:1896; IV Piggyback:1000] Out: 1051 [Urine:1050; Stool:1] Intake/Output this shift: Total I/O In: 227 [I.V.:227] Out: 101 [Urine:100; Stool:1]  General appearance: alert, cooperative, appears stated age and no distress Resp: clear to auscultation bilaterally, room air with saturations 97 and 98 Cardio: regular rate and sinus rhythm, S1, S2 normal, no murmur, click, rub or gallop, IV heparin is infusing GI: soft, non-tender; bowel sounds normal; no masses,  no organomegaly Extremities: extremities normal, atraumatic, no cyanosis or edema, noted significant distended varicosities the left lower extremity, mild right lower extremity edema nonpitting Neurologic: Grossly normal  Lab Results:  Basename 02/10/12 0415 02/09/12 1508  WBC 10.9* 11.9*  HGB  10.8* 11.5*  HCT 32.1* 34.0*  PLT 171 180   BMET  Basename 02/10/12 0415 02/09/12 1508  NA 135 130*  K 4.1 4.5  CL 103 95*  CO2 19 20  GLUCOSE 114* 126*  BUN 28* 33*  CREATININE 1.34 1.96*  CALCIUM 7.9* 8.9    Studies/Results: Dg Chest 2 View  02/09/2012  *RADIOLOGY REPORT*  Clinical Data: Fever  CHEST - 2 VIEW  Comparison: 02/06/2012  Findings: Moderate cardiomegaly.  Right lung clear.  Wedge-shaped focal opacity has developed in the left peripheral midlung zone. Small left pleural effusion.  No pneumothorax.  IMPRESSION: New wedge shaped the left midlung peripheral opacity worrisome for peripheral pneumonia or pulmonary infarct.  Original Report Authenticated By: Donavan Burnet, M.D.   Ct Angio Chest W/cm &/or Wo Cm  02/10/2012  *RADIOLOGY REPORT*  Clinical Data: Recent history of migratory arthralgias.  Severe left hip pain.  New opacity in the left lung noted on chest x-ray. Evaluate for potential pulmonary embolism.  CT ANGIOGRAPHY CHEST  Technique:  Multidetector CT imaging of the chest using the standard protocol during bolus administration of intravenous contrast. Multiplanar reconstructed images including MIPs were obtained and reviewed to evaluate the vascular anatomy.  Contrast: OMNIPAQUE IOHEXOL 350 MG/ML SOLN  Comparison: No priors.  Findings:  Mediastinum: There are no filling defects within the pulmonary arterial tree to suggest underlying pulmonary embolism. Heart size is mildly enlarged. There is no significant pericardial fluid, thickening or pericardial calcification.  The There is atherosclerosis of the thoracic aorta, the great vessels of the mediastinum and the coronary arteries, including calcified atherosclerotic plaque in the left main, left anterior descending, left circumflex and  right coronary arteries. Several enlarged left hilar lymph nodes are noted, measuring up to 1 cm short axis.  A large right hilar or mediastinal lymph nodes are appreciated. Esophagus  is unremarkable appearance. The patient is status post left lower lobectomy.  Lungs/Pleura: There is extensive air space consolidation throughout much of the left upper lobe, and there is a small-moderate left- sided parapneumonic pleural effusion.  Some faint thin enhancement of the left pleura is noted, suggesting an inflammatory response. No gas is present within the left pleural space at this time.  A trace right-sided pleural effusion is also seen layering dependently.  The right lung parenchyma itself appears clear.  No definite suspicious appearing pulmonary nodules or masses are identified.  Upper Abdomen: Unremarkable.  Musculoskeletal: There are no aggressive appearing lytic or blastic lesions noted in the visualized portions of the skeleton.  IMPRESSION: 1.  No evidence of pulmonary embolism. 2.  Large area of airspace consolidation in the left upper lobe is most compatible with pneumonia.  There is a small-moderate left- sided parapneumonic pleural effusion with some associated pleural enhancement.  This likely represents an exudative effusion, with some reactive pleural enhancement, however, clinical correlation for signs and symptoms of empyema is recommended.  3.  Status post left lower lobectomy. 4. Atherosclerosis, including left main and three-vessel coronary artery disease. Please note that although the presence of coronary artery calcium documents the presence of coronary artery disease, the severity of this disease and any potential stenosis cannot be assessed on this non-gated CT examination.  Assessment for potential risk factor modification, dietary therapy or pharmacologic therapy may be warranted, if clinically indicated. 5.  Mild cardiomegaly.  Original Report Authenticated By: Florencia Reasons, M.D.   US Renal  02/10/2012  *RADIOLOGY REPORT*  Clinical Data: Hypertension. Elevated renal function studies. Question obstructive uropathy.  RENAL/URINARY TRACT ULTRASOUND COMPLETE   Comparison:  None.  Findings:  Right Kidney:  12.5 cm. No hydronephrosis or renal mass. Decreased renal parenchyma consistent with result of medical renal disease.  Left Kidney:  12.9 cm.  No hydronephrosis or renal mass. Decreased renal parenchyma consistent with result of medical renal disease.  Bladder:  Partially decompressed without obvious mass.  IMPRESSION: No hydronephrosis.  Decreased renal parenchyma consistent with result of medical renal disease.  Original Report Authenticated By: Fuller Canada, M.D.    Medications: I have reviewed the patient's current medications.  Assessment/Plan:  Principal Problem:  *Community acquired pneumonia- LUL with parapneumonic effusion *Currently stable without hypoxia *CT of the chest demonstrates extensive air space consolidation throughout much of the left upper lobe with a small to moderate left sided parapneumonic pleural effusion *Appreciate pulmonary critical care medicine assistance *Continue empiric antibiotic coverage with Rocephin and Zithromax  Active Problems:  Dehydration with hyponatremia/Hypotension *Improved after rehydration *Now more hemodynamically stable   Leukocytosis *Decreased after admission and treatment of potential underlying causes *Patient was recently started on oral prednisone for presumed inflammatory arthritis symptoms so we'll need to continue while here   Positive D dimer *CT of the chest shows no evidence of pulmonary emboli *Will DC IV heparin   Hypertension *Was hypertensive at presentation *Home lisinopril and Maxzide as well as verapamil and remain on hold   CAD (coronary artery disease) *Resume home aspirin and statin therapy *Quiescent at this time with any ischemic symptoms reported by the patient   AKI (acute kidney injury) with ATN *Renal function improving with hydration *Continue to hold diuretics and ACE inhibitor   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 Clelia Croft for elevated ferritin level  Disposition *Remain in stepdown    LOS: 1 day   Junious Silk, ANP pager [phone]  [facility] [email or web address] Password: [email or web address]  02/10/2012, 1:26 PM  I have examined the patient and reviewed the chart. I agree with the above note.   Calvert Cantor, MD [phone]

## 2012-02-10 NOTE — Progress Notes (Signed)
Late Entry: Blood sugar is 47 mg/dl, Dr. Butler Denmark is made aware and stated to let pt eat and rechecked blood sugar.  Glimeperide discontinued.  Blood sugar rechecked, 74 mg/dl and per pt's wife, pt's blood sugar is always below 100.

## 2012-02-10 NOTE — Progress Notes (Signed)
PULMONARY/CCM PROGRESS NOTE  Requesting MD/Service: [facility] Date of admission: 4/25 Date of consult: 4/25 Reason for consultation: Hypotension, PNA vs PE  Pt Profile: Presented to ED 4/25 with several days of migratory arthralgias, severe L hip pain, new onset LE edema, L sided pleurodynia, wedge shaped opacity in L base on CXR, ARI (on ACE-I), transient hypotension. Admitting dx of PNA vs PE. PMH of hypertension ,CAD, Lung cancer (resected 2000), Prostate cancer     SUBJ: Much improved symptomatically. No rest dyspnea. Pleuritic pain is better. No distress. Edema has resolved. No new complaints    Filed Vitals:   02/10/12 0600 02/10/12 0700 02/10/12 0800 02/10/12 0935  BP: 113/63 108/54 141/67 129/57  Pulse: 60 64 76 83  Temp:    98.2 F (36.8 C)  TempSrc:    Oral  Resp: 17 20 20 22   Height:      Weight:      SpO2: 97% 98% 98% 97%    EXAM:  Gen: NAD HEENT: WNL Neck: supple, no LAN Lungs: Clear anteriorly and posteriorly without findings of consolidation, no pleural friction rub Cardiovascular: RRR s M Abdomen: NABS Musculoskeletal: No edema noted, no calf tenderness  Neuro: no focal deficits   DATA:  BMET    Component Value Date/Time   NA 135 02/10/2012 0415   K 4.1 02/10/2012 0415   CL 103 02/10/2012 0415   CO2 19 02/10/2012 0415   GLUCOSE 114* 02/10/2012 0415   BUN 28* 02/10/2012 0415   CREATININE 1.34 02/10/2012 0415   CALCIUM 7.9* 02/10/2012 0415   GFRNONAA 52* 02/10/2012 0415   GFRAA 61* 02/10/2012 0415    Lab Results  Component Value Date   WBC 10.9* 02/10/2012   HGB 10.8* 02/10/2012   HCT 32.1* 02/10/2012   MCV 99.4 02/10/2012   PLT 171 02/10/2012    No new CXR   IMPRESSION:   1) Systemic illness of unclear etiology with symptoms of diffuse arthralgias, ? Erythema nodosum, LE edema  2) New L basilar opacity on CXR - wedge shaped and consistent with Hampton's hump which would raise concern for PE. Also has hx of Lung CA but CXR from 4/22 did not reveal  opacity so this is not likely a local recurrence. CAP possible but seems an atypical presentation  3) transient hypotension - resolved  4) ARI - creatinine improved and GFR probably sufficient to perform CTA chest. V/Q ordered  PLAN:  Cancel V/Q scan CTA chest to eval L basilar opacity and R/O PE Agree with heparin and empiric abx for now Suggest discussing with Dr Shaune Pollack his outpt presentation and eval to determine if there are any unusual complicating features here    Billy Fischer, MD;  PCCM service; [place] [phone]

## 2012-02-10 NOTE — Progress Notes (Signed)
ANTICOAGULATION CONSULT NOTE - Follow Up Consult  Pharmacy Consult for heparin Indication: r/o PE  Labs:  [facility] 02/10/12 0415 02/09/12 1508  HGB 10.8* 11.5*  HCT 32.1* 34.0*  PLT 171 180  APTT -- --  LABPROT -- --  INR -- --  HEPARINUNFRC 0.22* --  CREATININE -- 1.96*  CKTOTAL -- --  CKMB -- --  TROPONINI -- --    Assessment: 71yo male subtherapeutic on heparin with initial dosing for possible PE, awaiting VQ.  Goal of Therapy:  Heparin level 0.3-0.7 units/ml   Plan:  Will increase heparin gtt by ~2 units/kg/hr to 1350 units/hr and check level in 8hr.  Colleen Can PharmD BCPS 02/10/2012,5:05 AM

## 2012-02-11 DIAGNOSIS — D696 Thrombocytopenia, unspecified: Secondary | ICD-10-CM | POA: Diagnosis not present

## 2012-02-11 DIAGNOSIS — J90 Pleural effusion, not elsewhere classified: Secondary | ICD-10-CM | POA: Diagnosis not present

## 2012-02-11 DIAGNOSIS — N179 Acute kidney failure, unspecified: Secondary | ICD-10-CM | POA: Diagnosis not present

## 2012-02-11 DIAGNOSIS — I959 Hypotension, unspecified: Secondary | ICD-10-CM | POA: Diagnosis not present

## 2012-02-11 LAB — CBC
HCT: 29.3 % — ABNORMAL LOW (ref 39.0–52.0)
MCH: 32.8 pg (ref 26.0–34.0)
MCV: 99 fL (ref 78.0–100.0)
Platelets: 185 10*3/uL (ref 150–400)
RBC: 2.96 MIL/uL — ABNORMAL LOW (ref 4.22–5.81)
WBC: 8.2 10*3/uL (ref 4.0–10.5)

## 2012-02-11 MED ORDER — PREDNISONE 20 MG PO TABS
20.0000 mg | ORAL_TABLET | Freq: Every day | ORAL | Status: DC
  Administered 2012-02-12 – 2012-02-13 (×2): 20 mg via ORAL
  Filled 2012-02-11 (×3): qty 1

## 2012-02-11 MED ORDER — VERAPAMIL HCL ER 240 MG PO TBCR
240.0000 mg | EXTENDED_RELEASE_TABLET | Freq: Two times a day (BID) | ORAL | Status: DC
  Administered 2012-02-11 – 2012-02-13 (×4): 240 mg via ORAL
  Filled 2012-02-11 (×5): qty 1

## 2012-02-11 MED ORDER — ALBUTEROL SULFATE (5 MG/ML) 0.5% IN NEBU: 2.5000 mg | INHALATION_SOLUTION | Freq: Four times a day (QID) | RESPIRATORY_TRACT | Status: DC | PRN

## 2012-02-11 MED ORDER — IPRATROPIUM BROMIDE 0.02 % IN SOLN: 0.5000 mg | RESPIRATORY_TRACT | Status: DC | PRN

## 2012-02-11 NOTE — Progress Notes (Signed)
Daniel Vega is a 71 y.o. male patient who transferred  From 2600 awake, alert  & orientated  X 3, Full Code, VSS - Blood pressure 133/76, pulse 60, temperature 97.9 F (36.6 C), temperature source Oral, resp. rate 20, height 5\' 7"  (1.702 m), weight 83 kg (182 lb 15.7 oz), SpO2 96.00%.,  no c/o shortness of breath, no c/o chest pain, no distress noted.    Allergies:  No Known Allergies   Past Medical History  Diagnosis Date  . Hypertension   . Hypercholesteremia   . Glucose intolerance (impaired glucose tolerance)   . Kidney stones, calcium oxalate   . CAD (coronary artery disease)   . 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 Adenomatous colon polyp     Dr. Madilyn Fireman  . Prostate cancer 2009    Dr. Hillis Range; tx w/ radium implants    Pt orientation to unit, room and routine. SR up x 2, fall risk assessment complete with Patient and family verbalizing understanding of risks associated with falls. Pt verbalizes an understanding of how to use the call bell and to call for help before getting out of bed.  Skin, clean-dry- intact without evidence of bruising, or skin tears.   No evidence of skin break down noted on exam.   Will cont to monitor and assist as needed.  Cindra Eves, RN 02/11/2012 6:55 PM

## 2012-02-11 NOTE — Progress Notes (Signed)
[facility]   Interim history: 71 y/o male who had felt unwell for about 4 days, had some peft-sided pleuritic type chest pain on 02/05/12, without cough, but with mild SOB, and fever.  He went to see his PMD on 02/06/12, and was prescribed Prednisone (30 mg daily) for bilateral hand and feet swelling and was referred to a rheumatologist and was decreased to 20 mg daily but told to continue until next visit.   Subjective: He feels much better today. No longer weak. Appetite is back as well. Wanting to get up and walk around.  Objective: Vital signs in last 24 hours: Temp:  [98.1 F (36.7 C)-101 F (38.3 C)] 99.7 F (37.6 C) (04/27 1200) Pulse Rate:  [62-91] 82  (04/27 1200) Resp:  [21-30] 21  (04/27 1200) BP: (108-139)/(52-67) 139/62 mmHg (04/27 1200) SpO2:  [91 %-98 %] 95 % (04/27 1200) Weight:  [83 kg (182 lb 15.7 oz)] 83 kg (182 lb 15.7 oz) (04/27 0500) Weight change: 0.3 kg (10.6 oz) Last BM Date: 02/09/12  Intake/Output from previous day: 04/26 0701 - 04/27 0700 In: 2957.5 [P.O.:240; I.V.:2467.5; IV Piggyback:250] Out: 2001 [Urine:2000; Stool:1] Intake/Output this shift: Total I/O In: 1080 [P.O.:480; I.V.:600] Out: 1075 [Urine:1075]  General appearance: alert, cooperative, appears stated age and no distress Resp: clear to auscultation bilaterally, room air with saturations 97 and 98 Cardio: regular rate and sinus rhythm GI: soft, non-tender; bowel sounds normal; no masses,  no organomegaly Extremities: extremities normal, atraumatic, no cyanosis or edema, noted significant distended varicosities   Lab Results:  Basename 02/11/12 0400 02/10/12 0415  WBC 8.2 10.9*  HGB 9.7* 10.8*  HCT 29.3* 32.1*  PLT 185 171   BMET  Basename 02/10/12 0415 02/09/12 1508  NA 135 130*  K 4.1 4.5  CL 103 95*  CO2 19 20  GLUCOSE 114* 126*  BUN 28* 33*  CREATININE 1.34 1.96*  CALCIUM 7.9* 8.9    Studies/Results: Dg Chest 2 View  02/09/2012  *RADIOLOGY REPORT*   Clinical Data: Fever  CHEST - 2 VIEW  Comparison: 02/06/2012  Findings: Moderate cardiomegaly.  Right lung clear.  Wedge-shaped focal opacity has developed in the left peripheral midlung zone. Small left pleural effusion.  No pneumothorax.  IMPRESSION: New wedge shaped the left midlung peripheral opacity worrisome for peripheral pneumonia or pulmonary infarct.  Original Report Authenticated By: Donavan Burnet, M.D.   Ct Angio Chest W/cm &/or Wo Cm  02/10/2012  *RADIOLOGY REPORT*  Clinical Data: Recent history of migratory arthralgias.  Severe left hip pain.  New opacity in the left lung noted on chest x-ray. Evaluate for potential pulmonary embolism.  CT ANGIOGRAPHY CHEST  Technique:  Multidetector CT imaging of the chest using the standard protocol during bolus administration of intravenous contrast. Multiplanar reconstructed images including MIPs were obtained and reviewed to evaluate the vascular anatomy.  Contrast: OMNIPAQUE IOHEXOL 350 MG/ML SOLN  Comparison: No priors.  Findings:  Mediastinum: There are no filling defects within the pulmonary arterial tree to suggest underlying pulmonary embolism. Heart size is mildly enlarged. There is no significant pericardial fluid, thickening or pericardial calcification.  The There is atherosclerosis of the thoracic aorta, the great vessels of the mediastinum and the coronary arteries, including calcified atherosclerotic plaque in the left main, left anterior descending, left circumflex and right coronary arteries. Several enlarged left hilar lymph nodes are noted, measuring up to 1 cm short axis.  A large right hilar or mediastinal lymph nodes are appreciated. Esophagus is unremarkable appearance.  The patient is status post left lower lobectomy.  Lungs/Pleura: There is extensive air space consolidation throughout much of the left upper lobe, and there is a small-moderate left- sided parapneumonic pleural effusion.  Some faint thin enhancement of the left pleura  is noted, suggesting an inflammatory response. No gas is present within the left pleural space at this time.  A trace right-sided pleural effusion is also seen layering dependently.  The right lung parenchyma itself appears clear.  No definite suspicious appearing pulmonary nodules or masses are identified.  Upper Abdomen: Unremarkable.  Musculoskeletal: There are no aggressive appearing lytic or blastic lesions noted in the visualized portions of the skeleton.  IMPRESSION: 1.  No evidence of pulmonary embolism. 2.  Large area of airspace consolidation in the left upper lobe is most compatible with pneumonia.  There is a small-moderate left- sided parapneumonic pleural effusion with some associated pleural enhancement.  This likely represents an exudative effusion, with some reactive pleural enhancement, however, clinical correlation for signs and symptoms of empyema is recommended.  3.  Status post left lower lobectomy. 4. Atherosclerosis, including left main and three-vessel coronary artery disease. Please note that although the presence of coronary artery calcium documents the presence of coronary artery disease, the severity of this disease and any potential stenosis cannot be assessed on this non-gated CT examination.  Assessment for potential risk factor modification, dietary therapy or pharmacologic therapy may be warranted, if clinically indicated. 5.  Mild cardiomegaly.  Original Report Authenticated By: Florencia Reasons, M.D.   US Renal  02/10/2012  *RADIOLOGY REPORT*  Clinical Data: Hypertension. Elevated renal function studies. Question obstructive uropathy.  RENAL/URINARY TRACT ULTRASOUND COMPLETE  Comparison:  None.  Findings:  Right Kidney:  12.5 cm. No hydronephrosis or renal mass. Decreased renal parenchyma consistent with result of medical renal disease.  Left Kidney:  12.9 cm.  No hydronephrosis or renal mass. Decreased renal parenchyma consistent with result of medical renal disease.   Bladder:  Partially decompressed without obvious mass.  IMPRESSION: No hydronephrosis.  Decreased renal parenchyma consistent with result of medical renal disease.  Original Report Authenticated By: Fuller Canada, M.D.    Medications: I have reviewed the patient's current medications.  Assessment/Plan:  Principal Problem:  *Community acquired pneumonia- LUL with parapneumonic effusion *no hypoxia *CT of the chest demonstrates extensive air space consolidation throughout much of the left upper lobe with a small to moderate left sided effusion vs empyema *PCCM recommending thoracentesis- I agree with this esp with his history of lung cancer- have discussed with patient and he agrees.  *Continue empiric antibiotic coverage with Rocephin and Zithromax  Active Problems:  Dehydration with hyponatremia/Hypotension *Improved after rehydration *Now more hemodynamically stable- he is urinating frequently- stop IVF today   Leukocytosis *resolved *Patient was recently started on oral prednisone for presumed inflammatory arthritis symptoms so we'll need to continue while here   Positive D dimer *CT of the chest shows no evidence of pulmonary emboli * DC'd IV heparin   Hypertension *Was hypertensive at presentation *Home lisinopril and Maxzide as well as verapamil and remain on hold   CAD (coronary artery disease) *Resume home aspirin and statin therapy *Quiescent at this time with any ischemic symptoms reported by the patient   AKI (acute kidney injury) with ATN *Renal function improving with hydration *Continue to hold diuretics and ACE inhibitor   Adenosquamous carcinoma of lung *Treated in the 1990s   Prostate cancer *Treated in 2009 with radium implant  Anemia with elevated  ferritin level *Being followed as an outpatient by Dr. Clelia Croft for elevated ferritin level  Disposition transfer to 5500; ambulate    LOS: 2 days   02/11/2012, 2:54 PM  Calvert Cantor, MD [phone]

## 2012-02-11 NOTE — Progress Notes (Signed)
PULMONARY/CCM PROGRESS NOTE  Requesting MD/Service: TRH  Date of admission: 4/25  Date of consult: 4/25  Reason for consultation: Hypotension, PNA vs PE   Pt Profile: Presented to ED 4/25 with several days of migratory arthralgias, severe L hip pain, new onset LE edema, L sided pleurodynia, wedge shaped opacity in L base on CXR, ARI (on ACE-I), transient hypotension. Admitting dx of PNA vs PE. PMH of hypertension ,CAD, Lung cancer (resected 2000), Prostate cancer   Subjective: Stronger, able to sit on own. Dry cough. Familty and nurse in room.  Objective: Vital signs in last 24 hours: Temp:  [98.1 F (36.7 C)-101 F (38.3 C)] 98.2 F (36.8 C) (04/27 0800) Pulse Rate:  [62-91] 91  (04/27 0800) Resp:  [22-30] 25  (04/27 0800) BP: (108-147)/(52-76) 134/59 mmHg (04/27 0800) SpO2:  [91 %-99 %] 96 % (04/27 0800) Weight:  [83 kg (182 lb 15.7 oz)] 83 kg (182 lb 15.7 oz) (04/27 0500)  EXAM:  Gen: NAD  HEENT: WNL  Neck: supple, no LAN  Lungs: Clear anteriorly and posteriorly without findings of consolidation, no pleural friction rub  Cardiovascular: RRR s M  Abdomen: NABS  Musculoskeletal: No edema noted, no calf tenderness  Neuro: no focal deficits   Lab Results:  Basename 02/11/12 0400 02/10/12 0415  WBC 8.2 10.9*  HGB 9.7* 10.8*  HCT 29.3* 32.1*  PLT 185 171   BMET  Basename 02/10/12 0415 02/09/12 1508  NA 135 130*  K 4.1 4.5  CL 103 95*  CO2 19 20  GLUCOSE 114* 126*  BUN 28* 33*  CREATININE 1.34 1.96*  CALCIUM 7.9* 8.9    Studies/Results: Dg Chest 2 View  02/09/2012  *RADIOLOGY REPORT*  Clinical Data: Fever  CHEST - 2 VIEW  Comparison: 02/06/2012  Findings: Moderate cardiomegaly.  Right lung clear.  Wedge-shaped focal opacity has developed in the left peripheral midlung zone. Small left pleural effusion.  No pneumothorax.  IMPRESSION: New wedge shaped the left midlung peripheral opacity worrisome for peripheral pneumonia or pulmonary infarct.  Original Report  Authenticated By: Donavan Burnet, M.D.   Ct Angio Chest W/cm &/or Wo Cm  02/10/2012  *RADIOLOGY REPORT*  Clinical Data: Recent history of migratory arthralgias.  Severe left hip pain.  New opacity in the left lung noted on chest x-ray. Evaluate for potential pulmonary embolism.  CT ANGIOGRAPHY CHEST  Technique:  Multidetector CT imaging of the chest using the standard protocol during bolus administration of intravenous contrast. Multiplanar reconstructed images including MIPs were obtained and reviewed to evaluate the vascular anatomy.  Contrast: OMNIPAQUE IOHEXOL 350 MG/ML SOLN  Comparison: No priors.  Findings:  Mediastinum: There are no filling defects within the pulmonary arterial tree to suggest underlying pulmonary embolism. Heart size is mildly enlarged. There is no significant pericardial fluid, thickening or pericardial calcification.  The There is atherosclerosis of the thoracic aorta, the great vessels of the mediastinum and the coronary arteries, including calcified atherosclerotic plaque in the left main, left anterior descending, left circumflex and right coronary arteries. Several enlarged left hilar lymph nodes are noted, measuring up to 1 cm short axis.  A large right hilar or mediastinal lymph nodes are appreciated. Esophagus is unremarkable appearance. The patient is status post left lower lobectomy.  Lungs/Pleura: There is extensive air space consolidation throughout much of the left upper lobe, and there is a small-moderate left- sided parapneumonic pleural effusion.  Some faint thin enhancement of the left pleura is noted, suggesting an inflammatory response. No gas  is present within the left pleural space at this time.  A trace right-sided pleural effusion is also seen layering dependently.  The right lung parenchyma itself appears clear.  No definite suspicious appearing pulmonary nodules or masses are identified.  Upper Abdomen: Unremarkable.  Musculoskeletal: There are no  aggressive appearing lytic or blastic lesions noted in the visualized portions of the skeleton.  IMPRESSION: 1.  No evidence of pulmonary embolism. 2.  Large area of airspace consolidation in the left upper lobe is most compatible with pneumonia.  There is a small-moderate left- sided parapneumonic pleural effusion with some associated pleural enhancement.  This likely represents an exudative effusion, with some reactive pleural enhancement, however, clinical correlation for signs and symptoms of empyema is recommended.  3.  Status post left lower lobectomy. 4. Atherosclerosis, including left main and three-vessel coronary artery disease. Please note that although the presence of coronary artery calcium documents the presence of coronary artery disease, the severity of this disease and any potential stenosis cannot be assessed on this non-gated CT examination.  Assessment for potential risk factor modification, dietary therapy or pharmacologic therapy may be warranted, if clinically indicated. 5.  Mild cardiomegaly.  Original Report Authenticated By: Florencia Reasons, M.D.   US Renal  02/10/2012  *RADIOLOGY REPORT*  Clinical Data: Hypertension. Elevated renal function studies. Question obstructive uropathy.  RENAL/URINARY TRACT ULTRASOUND COMPLETE  Comparison:  None.  Findings:  Right Kidney:  12.5 cm. No hydronephrosis or renal mass. Decreased renal parenchyma consistent with result of medical renal disease.  Left Kidney:  12.9 cm.  No hydronephrosis or renal mass. Decreased renal parenchyma consistent with result of medical renal disease.  Bladder:  Partially decompressed without obvious mass.  IMPRESSION: No hydronephrosis.  Decreased renal parenchyma consistent with result of medical renal disease.  Original Report Authenticated By: Fuller Canada, M.D.    Medications: I have reviewed the patient's current medications.  Assessment/Plan: IMPRESSION:  1) Systemic illness of unclear etiology with  symptoms of diffuse arthralgias, ? Erythema nodosum, LE edema   2) LUL pneumonia- agree with current antibiotics. Appropriate to encourage mobilization. I reviewed the CT images.  3) Pleural effusion- likely parapneumonic/ exudative, although hx of cancers is cautionary. Recommend- US guided thoracentesis for cultures, cytology/ cell block and routine labs.  3) transient hypotension - resolved   4) ARI - creatinine improved and GFR probably sufficient to perform CTA chest. V/Q ordered   LOS: 2 days   Ada Woodbury D 02/11/2012, 10:31 AM

## 2012-02-11 NOTE — Progress Notes (Signed)
Report called to [facility] RN on [phone], to transfer to 504 via wheelchair, Berle Mull RN

## 2012-02-11 NOTE — ED Provider Notes (Signed)
I saw and evaluated the patient, reviewed the resident's note and I agree with the findings and plan.   .Face to face Exam:  General:  Awake HEENT:  Atraumatic Resp:  Normal effort Abd:  Nondistended Neuro:No focal weakness Lymph: No adenopathy   Nelia Shi, MD 02/11/12 [phone]

## 2012-02-12 ENCOUNTER — Inpatient Hospital Stay (HOSPITAL_COMMUNITY): Payer: Medicare Other

## 2012-02-12 DIAGNOSIS — N179 Acute kidney failure, unspecified: Secondary | ICD-10-CM | POA: Diagnosis not present

## 2012-02-12 DIAGNOSIS — J90 Pleural effusion, not elsewhere classified: Secondary | ICD-10-CM | POA: Diagnosis not present

## 2012-02-12 DIAGNOSIS — J189 Pneumonia, unspecified organism: Secondary | ICD-10-CM | POA: Diagnosis not present

## 2012-02-12 DIAGNOSIS — I959 Hypotension, unspecified: Secondary | ICD-10-CM | POA: Diagnosis not present

## 2012-02-12 DIAGNOSIS — D696 Thrombocytopenia, unspecified: Secondary | ICD-10-CM | POA: Diagnosis not present

## 2012-02-12 DIAGNOSIS — R918 Other nonspecific abnormal finding of lung field: Secondary | ICD-10-CM | POA: Diagnosis not present

## 2012-02-12 LAB — CBC
HCT: 28.5 % — ABNORMAL LOW (ref 39.0–52.0)
Hemoglobin: 9.9 g/dL — ABNORMAL LOW (ref 13.0–17.0)
MCH: 33.1 pg (ref 26.0–34.0)
MCHC: 34.7 g/dL (ref 30.0–36.0)
MCV: 95.3 fL (ref 78.0–100.0)
RDW: 13.1 % (ref 11.5–15.5)

## 2012-02-12 LAB — BODY FLUID CELL COUNT WITH DIFFERENTIAL
Monocyte-Macrophage-Serous Fluid: 33 % — ABNORMAL LOW (ref 50–90)
Total Nucleated Cell Count, Fluid: 3032 cu mm — ABNORMAL HIGH (ref 0–1000)

## 2012-02-12 LAB — GRAM STAIN: Special Requests: 1

## 2012-02-12 LAB — CHOLESTEROL, BODY FLUID: Cholesterol, Fluid: 29 mg/dL

## 2012-02-12 LAB — LACTATE DEHYDROGENASE, PLEURAL OR PERITONEAL FLUID: LD, Fluid: 154 U/L — ABNORMAL HIGH (ref 3–23)

## 2012-02-12 LAB — GLUCOSE, SEROUS FLUID

## 2012-02-12 LAB — PROTEIN, BODY FLUID: Total protein, fluid: 2.9 g/dL

## 2012-02-12 MED ORDER — ENOXAPARIN SODIUM 40 MG/0.4ML ~~LOC~~ SOLN
40.0000 mg | SUBCUTANEOUS | Status: DC
  Administered 2012-02-12 – 2012-02-13 (×2): 40 mg via SUBCUTANEOUS
  Filled 2012-02-12 (×2): qty 0.4

## 2012-02-12 NOTE — Progress Notes (Signed)
[facility]   Interim history: 71 y/o male who had felt unwell for about 4 days, had some peft-sided pleuritic type chest pain on 02/05/12, without cough, but with mild SOB, and fever.  He went to see his PMD on 02/06/12, and was prescribed Prednisone (30 mg daily) for bilateral hand and feet swelling and was referred to a rheumatologist and was decreased to 20 mg daily but told to continue until next visit.   Subjective: Better, he is sitting on a chair, has no complaints  Objective: Vital signs in last 24 hours: Temp:  [97.3 F (36.3 C)-99.7 F (37.6 C)] 98.4 F (36.9 C) (04/28 0434) Pulse Rate:  [60-82] 60  (04/28 0434) Resp:  [18-21] 18  (04/28 0434) BP: (107-147)/(55-76) 129/55 mmHg (04/28 1030) SpO2:  [95 %-99 %] 99 % (04/28 1030) Weight change:  Last BM Date: 02/09/12  Intake/Output from previous day: 04/27 0701 - 04/28 0700 In: 1180 [P.O.:480; I.V.:600; IV Piggyback:100] Out: 1395 [Urine:1395] Intake/Output this shift:    General appearance: alert, cooperative, appears stated age and no distress Resp: clear to auscultation bilaterally, room air with saturations 97 and 98 Cardio: regular rate and sinus rhythm GI: soft, non-tender; bowel sounds normal; no masses,  no organomegaly Extremities: extremities normal, atraumatic, no cyanosis or edema, noted significant distended varicosities   Lab Results:  Basename 02/12/12 0552 02/11/12 0400  WBC 7.5 8.2  HGB 9.9* 9.7*  HCT 28.5* 29.3*  PLT 208 185   BMET  Basename 02/10/12 0415 02/09/12 1508  NA 135 130*  K 4.1 4.5  CL 103 95*  CO2 19 20  GLUCOSE 114* 126*  BUN 28* 33*  CREATININE 1.34 1.96*  CALCIUM 7.9* 8.9    Studies/Results: Dg Chest 1 View  02/12/2012  *RADIOLOGY REPORT*  Clinical Data: Post left thoracentesis.  CHEST - 1 VIEW  Comparison: 02/10/2012  Findings: No evidence of significant pleural fluid after thoracentesis procedure.  No pneumothorax identified.  There remains a left lower lobe  infiltrate.  No edema.  IMPRESSION: No pneumothorax after left thoracentesis.  No pleural fluid is identified.  Original Report Authenticated By: Reola Calkins, M.D.   Ct Angio Chest W/cm &/or Wo Cm  02/10/2012  *RADIOLOGY REPORT*  Clinical Data: Recent history of migratory arthralgias.  Severe left hip pain.  New opacity in the left lung noted on chest x-ray. Evaluate for potential pulmonary embolism.  CT ANGIOGRAPHY CHEST  Technique:  Multidetector CT imaging of the chest using the standard protocol during bolus administration of intravenous contrast. Multiplanar reconstructed images including MIPs were obtained and reviewed to evaluate the vascular anatomy.  Contrast: OMNIPAQUE IOHEXOL 350 MG/ML SOLN  Comparison: No priors.  Findings:  Mediastinum: There are no filling defects within the pulmonary arterial tree to suggest underlying pulmonary embolism. Heart size is mildly enlarged. There is no significant pericardial fluid, thickening or pericardial calcification.  The There is atherosclerosis of the thoracic aorta, the great vessels of the mediastinum and the coronary arteries, including calcified atherosclerotic plaque in the left main, left anterior descending, left circumflex and right coronary arteries. Several enlarged left hilar lymph nodes are noted, measuring up to 1 cm short axis.  A large right hilar or mediastinal lymph nodes are appreciated. Esophagus is unremarkable appearance. The patient is status post left lower lobectomy.  Lungs/Pleura: There is extensive air space consolidation throughout much of the left upper lobe, and there is a small-moderate left- sided parapneumonic pleural effusion.  Some faint thin enhancement of the  left pleura is noted, suggesting an inflammatory response. No gas is present within the left pleural space at this time.  A trace right-sided pleural effusion is also seen layering dependently.  The right lung parenchyma itself appears clear.  No definite  suspicious appearing pulmonary nodules or masses are identified.  Upper Abdomen: Unremarkable.  Musculoskeletal: There are no aggressive appearing lytic or blastic lesions noted in the visualized portions of the skeleton.  IMPRESSION: 1.  No evidence of pulmonary embolism. 2.  Large area of airspace consolidation in the left upper lobe is most compatible with pneumonia.  There is a small-moderate left- sided parapneumonic pleural effusion with some associated pleural enhancement.  This likely represents an exudative effusion, with some reactive pleural enhancement, however, clinical correlation for signs and symptoms of empyema is recommended.  3.  Status post left lower lobectomy. 4. Atherosclerosis, including left main and three-vessel coronary artery disease. Please note that although the presence of coronary artery calcium documents the presence of coronary artery disease, the severity of this disease and any potential stenosis cannot be assessed on this non-gated CT examination.  Assessment for potential risk factor modification, dietary therapy or pharmacologic therapy may be warranted, if clinically indicated. 5.  Mild cardiomegaly.  Original Report Authenticated By: Florencia Reasons, M.D.   US Thoracentesis Asp Pleural Space W/img Guide  02/12/2012  *RADIOLOGY REPORT*  Clinical Data:  Pneumonia, left-sided pleural effusion, history of lung cancer  ULTRASOUND GUIDED left THORACENTESIS  Comparison:  None  An ultrasound guided thoracentesis was thoroughly discussed with the patient and questions answered.  The benefits, risks, alternatives and complications were also discussed.  The patient understands and wishes to proceed with the procedure.  Written consent was obtained.  Ultrasound was performed to localize and mark an adequate pocket of fluid in the left chest.  The area was then prepped and draped in the normal sterile fashion.  1% Lidocaine was used for local anesthesia.  Under ultrasound guidance a  19 gauge Yueh catheter was introduced.  Thoracentesis was performed.  The catheter was removed and a dressing applied.  Complications:  None  Findings: A total of approximately  of clear yellow fluid was removed. A fluid sample was sent for laboratory analysis.  IMPRESSION: Successful ultrasound guided left thoracentesis yielding 230 ml of pleural fluid.  Read by Brayton El PA-C  Original Report Authenticated By: Reola Calkins, M.D.    Medications: I have reviewed the patient's current medications.  Assessment/Plan:   *Community acquired pneumonia- LUL with parapneumonic effusion *no hypoxia *CT of the chest demonstrates extensive air space consolidation throughout much of the left upper lobe with a small to moderate left sided effusion vs empyema *PCCM recommending thoracentesis- I agree with this esp with his history of lung cancer- have discussed with patient and he agrees.  *Continue empiric antibiotic coverage with Rocephin and Zithromax  Pleural Effusion -likely para pneumonic -await pleural fluid analysis -has h/o lung ca-so await cytology as well   Dehydration with hyponatremia/Hypotension *Improved after rehydration *Now more hemodynamically stable- he is urinating frequently   Leukocytosis *resolved *Patient was recently started on oral prednisone for presumed inflammatory arthritis symptoms so we'll need to continue while here   Positive D dimer *CT of the chest shows no evidence of pulmonary emboli * DC'd IV heparin   Hypertension *Was hypertensive at presentation *Home lisinopril and Maxzide as well as verapamil, verapamil has been restarted, others currently remain on hold   CAD (coronary artery disease) *Resume  home aspirin and statin therapy *Quiescent at this time with any ischemic symptoms reported by the patient   AKI (acute kidney injury) with ATN *Renal function improving with hydration *Continue to hold diuretics and ACE inhibitor *recheck  lytes in am   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 Clelia Croft for elevated ferritin level  ? Rheumatoid Arthritis -continue with prednisone -has follow up appointment with Dr Nickola Major previously scheduled  Disposition Remain inpatient  Code Status Full Code  DVT prophylaxis -prophylactic Lovenox   LOS: 3 days   02/12/2012, 11:16 AM  S Darrah Dredge

## 2012-02-12 NOTE — Procedures (Signed)
Successful US guided (L)thoracentesis. Yielding of clear yellow fluid.  Pt tolerated well, no immediate complications.  Specimen sent for labs as ordered. CXR ordered.  Brayton El PA-C 02/12/2012  10:35 AM

## 2012-02-13 DIAGNOSIS — C3490 Malignant neoplasm of unspecified part of unspecified bronchus or lung: Secondary | ICD-10-CM

## 2012-02-13 DIAGNOSIS — N179 Acute kidney failure, unspecified: Secondary | ICD-10-CM | POA: Diagnosis not present

## 2012-02-13 DIAGNOSIS — D696 Thrombocytopenia, unspecified: Secondary | ICD-10-CM | POA: Diagnosis not present

## 2012-02-13 DIAGNOSIS — I959 Hypotension, unspecified: Secondary | ICD-10-CM | POA: Diagnosis not present

## 2012-02-13 DIAGNOSIS — J90 Pleural effusion, not elsewhere classified: Secondary | ICD-10-CM | POA: Diagnosis not present

## 2012-02-13 DIAGNOSIS — J189 Pneumonia, unspecified organism: Secondary | ICD-10-CM | POA: Diagnosis not present

## 2012-02-13 LAB — BASIC METABOLIC PANEL
Chloride: 102 mEq/L (ref 96–112)
GFR calc Af Amer: 81 mL/min — ABNORMAL LOW (ref >90)
GFR calc non Af Amer: 70 mL/min — ABNORMAL LOW (ref >90)
Glucose, Bld: 110 mg/dL — ABNORMAL HIGH (ref 70–99)
Potassium: 3.7 mEq/L (ref 3.5–5.1)
Sodium: 136 mEq/L (ref 135–145)

## 2012-02-13 LAB — CBC
HCT: 29.9 % — ABNORMAL LOW (ref 39.0–52.0)
Hemoglobin: 10.3 g/dL — ABNORMAL LOW (ref 13.0–17.0)
MCHC: 34.4 g/dL (ref 30.0–36.0)
WBC: 7.3 10*3/uL (ref 4.0–10.5)

## 2012-02-13 MED ORDER — MOXIFLOXACIN HCL 400 MG PO TABS: 400.0000 mg | ORAL_TABLET | Freq: Every day | ORAL | Status: AC

## 2012-02-13 MED ORDER — WHITE PETROLATUM GEL
Status: AC
  Administered 2012-02-13: 06:00:00
  Filled 2012-02-13: qty 5

## 2012-02-13 NOTE — Plan of Care (Signed)
Problem: Discharge Progression Outcomes Goal: Vaccine documented on D/C instructions Outcome: Not Met (add Reason) Pt. Wanted to check with MD and will get at f/u appt. If needed

## 2012-02-13 NOTE — Progress Notes (Signed)
PULMONARY/CCM PROGRESS NOTE  Requesting MD/Service: TRH  Date of admission: 4/25  Date of consult: 4/25  Reason for consultation: Hypotension, PNA vs PE   Pt Profile: Presented to ED 4/25 with several days of migratory arthralgias, severe L hip pain, new onset LE edema, L sided pleurodynia, wedge shaped opacity in L base on CXR, ARI (on ACE-I), transient hypotension. Admitting dx of PNA vs PE. PMH of hypertension ,CAD, Lung cancer (resected 2000), Prostate cancer   Subjective: No distress. No new complaints. Feels ready to go home.  Objective: Vital signs in last 24 hours: Temp:  [97.5 F (36.4 C)-97.8 F (36.6 C)] 97.5 F (36.4 C) (04/29 0450) Pulse Rate:  [53-61] 61  (04/29 0450) Resp:  [17-18] 18  (04/29 0450) BP: (121-137)/(64-73) 121/64 mmHg (04/29 0908) SpO2:  [96 %-97 %] 97 % (04/29 0450)  EXAM:  Gen: NAD  HEENT: WNL  Neck: supple, no LAN  Lungs: Clear anteriorly and posteriorly without findings of consolidation, no pleural friction rub  Cardiovascular: RRR s M  Abdomen: NABS  Musculoskeletal: No edema noted, no calf tenderness  Neuro: no focal deficits   Lab Results:  Basename 02/13/12 0512 02/12/12 0552  WBC 7.3 7.5  HGB 10.3* 9.9*  HCT 29.9* 28.5*  PLT 245 208   BMET  Basename 02/13/12 0512  NA 136  K 3.7  CL 102  CO2 22  GLUCOSE 110*  BUN 20  CREATININE 1.06  CALCIUM 8.8    Studies/Results: Dg Chest 1 View  02/12/2012  *RADIOLOGY REPORT*  Clinical Data: Post left thoracentesis.  CHEST - 1 VIEW  Comparison: 02/10/2012  Findings: No evidence of significant pleural fluid after thoracentesis procedure.  No pneumothorax identified.  There remains a left lower lobe infiltrate.  No edema.  IMPRESSION: No pneumothorax after left thoracentesis.  No pleural fluid is identified.  Original Report Authenticated By: Reola Calkins, M.D.   US Thoracentesis Asp Pleural Space W/img Guide  02/12/2012  *RADIOLOGY REPORT*  Clinical Data:  Pneumonia, left-sided  pleural effusion, history of lung cancer  ULTRASOUND GUIDED left THORACENTESIS  Comparison:  None  An ultrasound guided thoracentesis was thoroughly discussed with the patient and questions answered.  The benefits, risks, alternatives and complications were also discussed.  The patient understands and wishes to proceed with the procedure.  Written consent was obtained.  Ultrasound was performed to localize and mark an adequate pocket of fluid in the left chest.  The area was then prepped and draped in the normal sterile fashion.  1% Lidocaine was used for local anesthesia.  Under ultrasound guidance a 19 gauge Yueh catheter was introduced.  Thoracentesis was performed.  The catheter was removed and a dressing applied.  Complications:  None  Findings: A total of approximately  of clear yellow fluid was removed. A fluid sample was sent for laboratory analysis.  IMPRESSION: Successful ultrasound guided left thoracentesis yielding 230 ml of pleural fluid.  Read by Brayton El PA-C  Original Report Authenticated By: Reola Calkins, M.D.    Medications: I have reviewed the patient's current medications.  Assessment/Plan: IMPRESSION:  1) Systemic illness of unclear etiology with symptoms of diffuse arthralgias, ? Erythema nodosum, LE edema   2) CAP - I believe that the predominant infiltrate is in the superior segment of the LLL. Much improved clinically. OK for discharge to home. Would complete 10 days of abx with a respiratory flouroquinolone - either levofloxacin or moxifloxacin.  Should have f/u cxr in 3-4 wks to ensure  resolution of infiltrate  3) Pleural effusion- para-pneumonic and benign by fluid analysis. No further eval or rx indicated. Need f/u of path but this is very unlikely to be malignant  3) transient hypotension - resolved   4) ARI - resolved   Appears ready for D/C home. See recs above. PCCM will sign off. Please call if we can be of further assistance  Billy Fischer 02/13/2012, 11:16 AM    He

## 2012-02-13 NOTE — Discharge Instructions (Signed)
Please have your Primary MD follow up Pleural fluid cultures and Cytology

## 2012-02-13 NOTE — Progress Notes (Signed)
   CARE MANAGEMENT NOTE 02/13/2012  Patient:  Daniel Vega, Daniel Vega   Account Number:  [id number]  Date Initiated:  02/13/2012  Documentation initiated by:  Letha Cape  Subjective/Objective Assessment:   dx pna, effusion  admit- lives with spouse. pta independent.     Action/Plan:   Anticipated DC Date:  02/13/2012   Anticipated DC Plan:  HOME/SELF CARE      DC Planning Services  CM consult      Choice offered to / List presented to:             Status of service:  Completed, signed off Medicare Important Message given?   (If response is "NO", the following Medicare IM given date fields will be blank) Date Medicare IM given:   Date Additional Medicare IM given:    Discharge Disposition:  HOME/SELF CARE  Per UR Regulation:    If discussed at Long Length of Stay Meetings, dates discussed:    Comments:  02/13/12 15:27  Letha Cape RN, BSN [phone] patient lives with spouse, pta independent.  No needs identified.

## 2012-02-13 NOTE — Progress Notes (Signed)
02/13/12 NSG 1400 Patient discharged to home with family, discharged instructions given and reviewed with patient.  Patient verbalized understanding, care notes given for new meds and pertinent education. Skin intact at discharge, IV discharged and intact. Patient escorted to car via wheelchair by volunteer service. Forbes Cellar, RN

## 2012-02-13 NOTE — Progress Notes (Signed)
Utilization review completed.  

## 2012-02-13 NOTE — Discharge Summary (Signed)
PATIENT DETAILS Name: Daniel Vega Age: 71 y.o. Sex: male Date of Birth: December 08, 1940 MRN: [id number]. Admit Date: 02/09/2012 Admitting Physician: Laveda Norman, MD OZH:[id number],QIONG RUTH, MD, MD  PRIMARY DISCHARGE DIAGNOSIS:  Principal Problem:  *Community acquired pneumonia- LUL with parapneumonic effusion Active Problems:  Hypertension  CAD (coronary artery disease)  Adenosquamous carcinoma of lung  Prostate cancer  Dehydration with hyponatremia  Hypotension  AKI (acute kidney injury) with ATN  Leukocytosis  Positive D dimer      PAST MEDICAL HISTORY: Past Medical History  Diagnosis Date  . Hypertension   . Hypercholesteremia   . Glucose intolerance (impaired glucose tolerance)   . Kidney stones, calcium oxalate   . CAD (coronary artery disease)   . 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 Adenomatous colon polyp     Dr. Madilyn Fireman  . Prostate cancer 2009    Dr. Hillis Range; tx w/ radium implants    DISCHARGE MEDICATIONS: Medication List  As of 02/13/2012 11:29 AM   TAKE these medications         ALPRAZolam 0.25 MG tablet   Commonly known as: XANAX   Take 0.25 mg by mouth 3 (three) times daily as needed. As needed for anxiety.      aspirin EC 81 MG tablet   Take 81 mg by mouth daily.      atorvastatin 20 MG tablet   Commonly known as: LIPITOR   Take 40 mg by mouth daily.      glucosamine-chondroitin 500-400 MG tablet   Take 1 tablet by mouth 2 (two) times daily.      lisinopril 10 MG tablet   Commonly known as: PRINIVIL,ZESTRIL   Take 10 mg by mouth daily.      MAXZIDE-25 37.5-25 MG per tablet   Generic drug: triamterene-hydrochlorothiazide   Take 1 tablet by mouth daily.      moxifloxacin 400 MG tablet   Commonly known as: AVELOX   Take 1 tablet (400 mg total) by mouth daily.      nitroGLYCERIN 0.4 MG SL tablet   Commonly known as: NITROSTAT   Place 0.4 mg under the tongue every 5 (five) minutes as needed.      omeprazole 20 MG capsule   Commonly known as: PRILOSEC   Take 20 mg by mouth daily.      predniSONE 10 MG tablet   Commonly known as: DELTASONE   Take 30 mg by mouth daily with breakfast. For 21 days. Patient started on 02/06/12.      verapamil 240 MG CR tablet   Commonly known as: CALAN-SR   Take 240 mg by mouth 2 (two) times daily.      VITAMIN B 12 PO   Take 1,000 mcg by mouth daily.      VOLTAREN 1 % Gel   Generic drug: diclofenac sodium   Apply topically 4 (four) times daily as needed. As needed for muscle pain.             BRIEF HPI:  See H&P, Labs, Consult and Test reports for all details in brief, Presented to ED 4/25 with several days of migratory arthralgias, severe L hip pain, new onset LE edema, L sided pleurodynia, wedge shaped opacity in L base on CXR, ARI (on ACE-I), transient hypotension. Admitting dx of PNA vs PE. PMH of hypertension ,CAD, Lung cancer (resected 2000), Prostate cancer   CONSULTATIONS:   pulmonary/intensive care  PERTINENT RADIOLOGIC STUDIES: Dg Chest 1 View  02/12/2012  *  RADIOLOGY REPORT*  Clinical Data: Post left thoracentesis.  CHEST - 1 VIEW  Comparison: 02/10/2012  Findings: No evidence of significant pleural fluid after thoracentesis procedure.  No pneumothorax identified.  There remains a left lower lobe infiltrate.  No edema.  IMPRESSION: No pneumothorax after left thoracentesis.  No pleural fluid is identified.  Original Report Authenticated By: Reola Calkins, M.D.   Dg Chest 2 View  02/09/2012  *RADIOLOGY REPORT*  Clinical Data: Fever  CHEST - 2 VIEW  Comparison: 02/06/2012  Findings: Moderate cardiomegaly.  Right lung clear.  Wedge-shaped focal opacity has developed in the left peripheral midlung zone. Small left pleural effusion.  No pneumothorax.  IMPRESSION: New wedge shaped the left midlung peripheral opacity worrisome for peripheral pneumonia or pulmonary infarct.  Original Report Authenticated By: Donavan Burnet, M.D.   Dg Chest 2 View  02/06/2012   *RADIOLOGY REPORT*  Clinical Data: Lung cancer, fever, left chest pain.  CHEST - 2 VIEW  Comparison: 12/29/2010.  Findings: Trachea is midline.  Heart size normal.  Pleural parenchymal scarring at the base of the left hemithorax, stable. Lungs are otherwise clear.  No pleural fluid.  IMPRESSION: No acute findings.  Original Report Authenticated By: Reyes Ivan, M.D.   Dg Hip Complete Right  02/06/2012  *RADIOLOGY REPORT*  Clinical Data: Prostate cancer, right hip pain.  RIGHT HIP - COMPLETE 2+ VIEW  Comparison: None.  Findings: Minimal medial joint space narrowing.  Mild subchondral sclerosis and cyst formation.  Mild collar osteophytosis. Brachytherapy seeds project in the expected location of the prostate.  IMPRESSION:  1.  No acute findings. 2.  Mild right hip osteoarthritis.  Original Report Authenticated By: Reyes Ivan, M.D.   Ct Angio Chest W/cm &/or Wo Cm  02/10/2012  *RADIOLOGY REPORT*  Clinical Data: Recent history of migratory arthralgias.  Severe left hip pain.  New opacity in the left lung noted on chest x-ray. Evaluate for potential pulmonary embolism.  CT ANGIOGRAPHY CHEST  Technique:  Multidetector CT imaging of the chest using the standard protocol during bolus administration of intravenous contrast. Multiplanar reconstructed images including MIPs were obtained and reviewed to evaluate the vascular anatomy.  Contrast: OMNIPAQUE IOHEXOL 350 MG/ML SOLN  Comparison: No priors.  Findings:  Mediastinum: There are no filling defects within the pulmonary arterial tree to suggest underlying pulmonary embolism. Heart size is mildly enlarged. There is no significant pericardial fluid, thickening or pericardial calcification.  The There is atherosclerosis of the thoracic aorta, the great vessels of the mediastinum and the coronary arteries, including calcified atherosclerotic plaque in the left main, left anterior descending, left circumflex and right coronary arteries. Several enlarged  left hilar lymph nodes are noted, measuring up to 1 cm short axis.  A large right hilar or mediastinal lymph nodes are appreciated. Esophagus is unremarkable appearance. The patient is status post left lower lobectomy.  Lungs/Pleura: There is extensive air space consolidation throughout much of the left upper lobe, and there is a small-moderate left- sided parapneumonic pleural effusion.  Some faint thin enhancement of the left pleura is noted, suggesting an inflammatory response. No gas is present within the left pleural space at this time.  A trace right-sided pleural effusion is also seen layering dependently.  The right lung parenchyma itself appears clear.  No definite suspicious appearing pulmonary nodules or masses are identified.  Upper Abdomen: Unremarkable.  Musculoskeletal: There are no aggressive appearing lytic or blastic lesions noted in the visualized portions of the skeleton.  IMPRESSION:  1.  No evidence of pulmonary embolism. 2.  Large area of airspace consolidation in the left upper lobe is most compatible with pneumonia.  There is a small-moderate left- sided parapneumonic pleural effusion with some associated pleural enhancement.  This likely represents an exudative effusion, with some reactive pleural enhancement, however, clinical correlation for signs and symptoms of empyema is recommended.  3.  Status post left lower lobectomy. 4. Atherosclerosis, including left main and three-vessel coronary artery disease. Please note that although the presence of coronary artery calcium documents the presence of coronary artery disease, the severity of this disease and any potential stenosis cannot be assessed on this non-gated CT examination.  Assessment for potential risk factor modification, dietary therapy or pharmacologic therapy may be warranted, if clinically indicated. 5.  Mild cardiomegaly.  Original Report Authenticated By: Florencia Reasons, M.D.   US Renal  02/10/2012  *RADIOLOGY REPORT*   Clinical Data: Hypertension. Elevated renal function studies. Question obstructive uropathy.  RENAL/URINARY TRACT ULTRASOUND COMPLETE  Comparison:  None.  Findings:  Right Kidney:  12.5 cm. No hydronephrosis or renal mass. Decreased renal parenchyma consistent with result of medical renal disease.  Left Kidney:  12.9 cm.  No hydronephrosis or renal mass. Decreased renal parenchyma consistent with result of medical renal disease.  Bladder:  Partially decompressed without obvious mass.  IMPRESSION: No hydronephrosis.  Decreased renal parenchyma consistent with result of medical renal disease.  Original Report Authenticated By: Fuller Canada, M.D.   US Thoracentesis Asp Pleural Space W/img Guide  02/12/2012  *RADIOLOGY REPORT*  Clinical Data:  Pneumonia, left-sided pleural effusion, history of lung cancer  ULTRASOUND GUIDED left THORACENTESIS  Comparison:  None  An ultrasound guided thoracentesis was thoroughly discussed with the patient and questions answered.  The benefits, risks, alternatives and complications were also discussed.  The patient understands and wishes to proceed with the procedure.  Written consent was obtained.  Ultrasound was performed to localize and mark an adequate pocket of fluid in the left chest.  The area was then prepped and draped in the normal sterile fashion.  1% Lidocaine was used for local anesthesia.  Under ultrasound guidance a 19 gauge Yueh catheter was introduced.  Thoracentesis was performed.  The catheter was removed and a dressing applied.  Complications:  None  Findings: A total of approximately  of clear yellow fluid was removed. A fluid sample was sent for laboratory analysis.  IMPRESSION: Successful ultrasound guided left thoracentesis yielding 230 ml of pleural fluid.  Read by Brayton El PA-C  Original Report Authenticated By: Reola Calkins, M.D.     PERTINENT LAB RESULTS: CBC:  Basename 02/13/12 0512 02/12/12 0552  WBC 7.3 7.5  HGB 10.3* 9.9*    HCT 29.9* 28.5*  PLT 245 208   CMET CMP     Component Value Date/Time   NA 136 02/13/2012 0512   K 3.7 02/13/2012 0512   CL 102 02/13/2012 0512   CO2 22 02/13/2012 0512   GLUCOSE 110* 02/13/2012 0512   BUN 20 02/13/2012 0512   CREATININE 1.06 02/13/2012 0512   CALCIUM 8.8 02/13/2012 0512   PROT 6.3 02/12/2012 1030   ALBUMIN 2.8* 02/10/2012 0415   AST 20 02/10/2012 0415   ALT 14 02/10/2012 0415   ALKPHOS 47 02/10/2012 0415   BILITOT 0.5 02/10/2012 0415   GFRNONAA 70* 02/13/2012 0512   GFRAA 81* 02/13/2012 0512    GFR Estimated Creatinine Clearance: 67.8 ml/min (by C-G formula based on Cr of 1.06). No  results found for this basename: LIPASE:2,AMYLASE:2 in the last 72 hours No results found for this basename: CKTOTAL:3,CKMB:3,CKMBINDEX:3,TROPONINI:3 in the last 72 hours No components found with this basename: POCBNP:3 No results found for this basename: DDIMER:2 in the last 72 hours No results found for this basename: HGBA1C:2 in the last 72 hours No results found for this basename: CHOL:2,HDL:2,LDLCALC:2,TRIG:2,CHOLHDL:2,LDLDIRECT:2 in the last 72 hours No results found for this basename: TSH,T4TOTAL,FREET3,T3FREE,THYROIDAB in the last 72 hours No results found for this basename: VITAMINB12:2,FOLATE:2,FERRITIN:2,TIBC:2,IRON:2,RETICCTPCT:2 in the last 72 hours Coags: No results found for this basename: PT:2,INR:2 in the last 72 hours Microbiology: Recent Results (from the past 240 hour(s))  CULTURE, BLOOD (ROUTINE X 2)     Status: Normal (Preliminary result)   Collection Time   02/09/12  3:00 PM      Component Value Range Status Comment   Specimen Description BLOOD ARM LEFT   Final    Special Requests BOTTLES DRAWN AEROBIC AND ANAEROBIC 10CC EACH   Final    Culture  Setup Time [id number]   Final    Culture     Final    Value:        BLOOD CULTURE RECEIVED NO GROWTH TO DATE CULTURE WILL BE HELD FOR 5 DAYS BEFORE ISSUING A FINAL NEGATIVE REPORT   Report Status PENDING   Incomplete    CULTURE, BLOOD (ROUTINE X 2)     Status: Normal (Preliminary result)   Collection Time   02/09/12  3:07 PM      Component Value Range Status Comment   Specimen Description BLOOD ARM RIGHT   Final    Special Requests BOTTLES DRAWN AEROBIC AND ANAEROBIC 10CC EACH   Final    Culture  Setup Time [id number]   Final    Culture     Final    Value:        BLOOD CULTURE RECEIVED NO GROWTH TO DATE CULTURE WILL BE HELD FOR 5 DAYS BEFORE ISSUING A FINAL NEGATIVE REPORT   Report Status PENDING   Incomplete   URINE CULTURE     Status: Normal   Collection Time   02/09/12  3:39 PM      Component Value Range Status Comment   Specimen Description URINE, CLEAN CATCH   Final    Special Requests NONE   Final    Culture  Setup Time [id number]   Final    Colony Count NO GROWTH   Final    Culture NO GROWTH   Final    Report Status 02/10/2012 FINAL   Final   MRSA PCR SCREENING     Status: Normal   Collection Time   02/09/12  9:35 PM      Component Value Range Status Comment   MRSA by PCR NEGATIVE  NEGATIVE  Final   BODY FLUID CULTURE     Status: Normal (Preliminary result)   Collection Time   02/12/12 10:25 AM      Component Value Range Status Comment   Specimen Description FLUID PLEURAL LEFT   Final    Special Requests 1 SYRINGE @ 61CC   Final    Gram Stain     Final    Value: MODERATE WBC PRESENT,BOTH PMN AND MONONUCLEAR     NO ORGANISMS SEEN     Performed at [facility]   Culture NO GROWTH 1 DAY   Final    Report Status PENDING   Incomplete   GRAM STAIN     Status: Normal  Collection Time   02/12/12 10:25 AM      Component Value Range Status Comment   Specimen Description FLUID PLEURAL LEFT   Final    Special Requests 1 SYRINGE @ 61CC   Final    Gram Stain     Final    Value: MODERATE WBC PRESENT,BOTH PMN AND MONONUCLEAR     NO ORGANISMS SEEN   Report Status 02/12/2012 FINAL   Final      BRIEF HOSPITAL COURSE:   Principal Problem:  *Community acquired pneumonia- LUL with  parapneumonic effusion Patient was admitted to the step down unit for close monitoring, a CT angio of the chest did not reveal any pulmonary embolism, however it demonstrated an extensive airspace consolidation with a moderate left-sided effusion. Because of his history of prior lung cancer the left pleural effusion was tapped under ultrasound guidance by radiology. Clinically and also by pleural fluid analysis this is more consistent with a parapneumonic effusion, however a cytology and cultures are still pending and will need followup when the patient follows up with his primary care M.D. All of this has been explained to the patient and the patient's spouse at bedside in detail. In the hospital he was empirically started on Rocephin and Zithromax, on discharge she will be transitioned to oral Avelox.  Pleural Effusion  -likely para pneumonic  -await pleural fluid analysis-preliminary analysis suggests a parapneumonic effusion -has h/o lung ca-so await cytology as well, which is still pending at the time of discharge  Dehydration with hyponatremia/Hypotension  *Improved after rehydration  *Now more hemodynamically stable- he is urinating frequently   Leukocytosis  *resolved  *Patient was recently started on oral prednisone for presumed inflammatory arthritis symptoms so we'll need to continue while here   Positive D dimer  *CT of the chest shows no evidence of pulmonary emboli , briefly was placed on intravenous heparin, abscess CT of the chest showed no embolism, this was discontinued.  Hypertension -Resume prior home meds on discharge   *CAD (coronary artery disease)  *Resume home aspirin and statin therapy  *Quiescent at this time with any ischemic symptoms reported by the patient   AKI (acute kidney injury) with ATN  *Renal function back to normal with hydration. This is likely prerenal.  Adenosquamous carcinoma of lung  *Treated in the 1990s  -await pleural effusion  analysis-cytology   Prostate cancer  *Treated in 2009 with radium implant   Anemia with elevated ferritin level  *Being followed as an outpatient by Dr. Clelia Croft for elevated ferritin level   ? Rheumatoid Arthritis  -continue with prednisone  -has follow up appointment with Dr Nickola Major previously scheduled   TODAY-DAY OF DISCHARGE:  Subjective:   Grason [place] today has no headache,no chest abdominal pain,no new weakness tingling or numbness, feels much better wants to go home today.   Objective:   Blood pressure 121/64, pulse 61, temperature 97.5 F (36.4 C), temperature source Oral, resp. rate 18, height 5\' 7"  (1.702 m), weight 83 kg (182 lb 15.7 oz), SpO2 97.00%.  Intake/Output Summary (Last 24 hours) at 02/13/12 1129 Last data filed at 02/13/12 0841  Gross per 24 hour  Intake    410 ml  Output    200 ml  Net    210 ml    Exam Awake Alert, Oriented *3, No new F.N deficits, Normal affect [place].AT,PERRAL Supple Neck,No JVD, No cervical lymphadenopathy appriciated.  Symmetrical Chest wall movement, Good air movement bilaterally, CTAB RRR,No Gallops,Rubs or new Murmurs, No Parasternal  Heave +ve B.Sounds, Abd Soft, Non tender, No organomegaly appriciated, No rebound -guarding or rigidity. No Cyanosis, Clubbing or edema, No new Rash or bruise  DISPOSITION: Home  DISCHARGE INSTRUCTIONS:    Follow-up Information    Follow up with Hollice Espy, MD. (PLEASE KEEP YOUR NEXT APPOINTMENT)    Contact information:   [address] [place] [address] [phone]    Needs to follow up Pleural fluid cultures and cytology       Total Time spent on discharge equals 45 minutes.  SignedJeoffrey Massed 02/13/2012 11:29 AM

## 2012-02-15 LAB — BODY FLUID CULTURE

## 2012-02-15 LAB — CULTURE, BLOOD (ROUTINE X 2): Culture  Setup Time: 201304252357

## 2012-03-10 ENCOUNTER — Emergency Department (HOSPITAL_COMMUNITY): Payer: BC Managed Care – PPO

## 2012-03-10 ENCOUNTER — Emergency Department (HOSPITAL_COMMUNITY)
Admission: EM | Admit: 2012-03-10 | Discharge: 2012-03-10 | Disposition: A | Discharge status: Home / Self Care | Payer: BC Managed Care – PPO | Attending: Emergency Medicine | Admitting: Emergency Medicine

## 2012-03-10 ENCOUNTER — Encounter (HOSPITAL_COMMUNITY): Payer: Self-pay

## 2012-03-10 DIAGNOSIS — R109 Unspecified abdominal pain: Secondary | ICD-10-CM | POA: Insufficient documentation

## 2012-03-10 DIAGNOSIS — Z87442 Personal history of urinary calculi: Secondary | ICD-10-CM | POA: Insufficient documentation

## 2012-03-10 DIAGNOSIS — R7309 Other abnormal glucose: Secondary | ICD-10-CM | POA: Insufficient documentation

## 2012-03-10 DIAGNOSIS — E7800 Pure hypercholesterolemia, unspecified: Secondary | ICD-10-CM | POA: Insufficient documentation

## 2012-03-10 DIAGNOSIS — N1330 Unspecified hydronephrosis: Secondary | ICD-10-CM | POA: Diagnosis not present

## 2012-03-10 DIAGNOSIS — N200 Calculus of kidney: Secondary | ICD-10-CM

## 2012-03-10 DIAGNOSIS — R319 Hematuria, unspecified: Secondary | ICD-10-CM | POA: Diagnosis not present

## 2012-03-10 DIAGNOSIS — Z85118 Personal history of other malignant neoplasm of bronchus and lung: Secondary | ICD-10-CM | POA: Insufficient documentation

## 2012-03-10 DIAGNOSIS — Z860100 Personal history of colon polyps, unspecified: Secondary | ICD-10-CM | POA: Insufficient documentation

## 2012-03-10 DIAGNOSIS — I10 Essential (primary) hypertension: Secondary | ICD-10-CM | POA: Insufficient documentation

## 2012-03-10 DIAGNOSIS — R3915 Urgency of urination: Secondary | ICD-10-CM | POA: Diagnosis not present

## 2012-03-10 DIAGNOSIS — Z8546 Personal history of malignant neoplasm of prostate: Secondary | ICD-10-CM | POA: Insufficient documentation

## 2012-03-10 DIAGNOSIS — Z7982 Long term (current) use of aspirin: Secondary | ICD-10-CM | POA: Insufficient documentation

## 2012-03-10 DIAGNOSIS — Z79899 Other long term (current) drug therapy: Secondary | ICD-10-CM | POA: Insufficient documentation

## 2012-03-10 LAB — BASIC METABOLIC PANEL
BUN: 45 mg/dL — ABNORMAL HIGH (ref 6–23)
CO2: 21 mEq/L (ref 19–32)
Chloride: 103 mEq/L (ref 96–112)
Glucose, Bld: 121 mg/dL — ABNORMAL HIGH (ref 70–99)
Potassium: 4.9 mEq/L (ref 3.5–5.1)

## 2012-03-10 LAB — CBC
HCT: 29.4 % — ABNORMAL LOW (ref 39.0–52.0)
Hemoglobin: 10 g/dL — ABNORMAL LOW (ref 13.0–17.0)
MCH: 32.8 pg (ref 26.0–34.0)
MCHC: 34 g/dL (ref 30.0–36.0)
MCV: 96.4 fL (ref 78.0–100.0)
Platelets: 120 10*3/uL — ABNORMAL LOW (ref 150–400)
RBC: 3.05 MIL/uL — ABNORMAL LOW (ref 4.22–5.81)
RDW: 14.2 % (ref 11.5–15.5)
WBC: 6.8 10*3/uL (ref 4.0–10.5)

## 2012-03-10 LAB — URINALYSIS, ROUTINE W REFLEX MICROSCOPIC
Glucose, UA: NEGATIVE mg/dL
Protein, ur: 30 mg/dL — AB

## 2012-03-10 LAB — DIFFERENTIAL
Eosinophils Relative: 1 % (ref 0–5)
Lymphocytes Relative: 10 % — ABNORMAL LOW (ref 12–46)
Lymphs Abs: 0.7 10*3/uL (ref 0.7–4.0)
Monocytes Absolute: 0.4 10*3/uL (ref 0.1–1.0)

## 2012-03-10 LAB — URINE MICROSCOPIC-ADD ON

## 2012-03-10 LAB — PROTIME-INR
INR: 0.9 (ref 0.00–1.49)
Prothrombin Time: 12.3 s (ref 11.6–15.2)

## 2012-03-10 MED ORDER — TAMSULOSIN HCL 0.4 MG PO CAPS: 0.4000 mg | ORAL_CAPSULE | Freq: Every day | ORAL | Status: DC

## 2012-03-10 MED ORDER — ONDANSETRON 4 MG PO TBDP: ORAL_TABLET | ORAL | Status: AC

## 2012-03-10 MED ORDER — HYDROCODONE-ACETAMINOPHEN 5-500 MG PO TABS: 1.0000 | ORAL_TABLET | Freq: Four times a day (QID) | ORAL | Status: AC | PRN

## 2012-03-10 NOTE — ED Provider Notes (Signed)
History     CSN: [id number]  Arrival date & time 03/10/12  1242   First MD Initiated Contact with Patient 03/10/12 1403      Chief Complaint  Patient presents with  . Hematuria    (Consider location/radiation/quality/duration/timing/severity/associated sxs/prior treatment) HPI Comments: Hx of prostate cancer and kidney stones.  Hematuria and mild pain in L flank/L side today.  No lightheadedness.  Otherwise doing well.  Patient is a 71 y.o. male presenting with hematuria. The history is provided by the patient.  Hematuria This is a new problem. The current episode started yesterday. The problem is unchanged. He describes the hematuria as gross hematuria. The hematuria occurs during the initial portion of his urinary stream. Clotting in stream: throughout. The pain is mild (L side and L flank). Irritative symptoms include nocturia and urgency. Obstructive symptoms include dribbling and a slower stream. Associated symptoms include flank pain (mild L flank). Pertinent negatives include no dysuria, fever, genital pain, nausea or vomiting.    Past Medical History  Diagnosis Date  . Hypertension   . Hypercholesteremia   . Glucose intolerance (impaired glucose tolerance)   . Kidney stones, calcium oxalate   . CAD (coronary artery disease)   . 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 Adenomatous colon polyp     Dr. Madilyn Fireman  . Prostate cancer 2009    Dr. Hillis Range; tx w/ radium implants    Past Surgical History  Procedure Date  . Lobectomy 1999    left - Dr. Laneta Simmers  . Repair right testicular torsion 1962  . Cardiac catheterization 1997    Dr. [place]  . Prostate radium implants 2009  . Lung cancer surgery 2003  . Colonoscopy ;11/12/99;04/29/03    12/27/09    Family History  Problem Relation Age of Onset  . Heart disease Mother   . Hypertension Mother   . Heart disease Father   . Hypertension Father   . Stroke Father   . Other Brother     spinal meningitis  . Other  Brother     MVA  . Cancer Sister     stomach    History  Substance Use Topics  . Smoking status: Former Smoker    Types: Cigarettes    Quit date: 10/17/1982  . Smokeless tobacco: Current User    Types: Chew  . Alcohol Use: 3.5 oz/week    7 drink(s) per week     occassionally       Review of Systems  Constitutional: Negative for fever, activity change and fatigue.  HENT: Negative for congestion.   Eyes: Negative for pain.  Respiratory: Negative for chest tightness, shortness of breath, wheezing and stridor.   Cardiovascular: Negative for chest pain and leg swelling.  Gastrointestinal: Negative for nausea and vomiting.  Genitourinary: Positive for urgency, hematuria, flank pain (mild L flank) and nocturia. Negative for dysuria.  Musculoskeletal: Negative for arthralgias.  Skin: Negative for rash.  Neurological: Negative for headaches.  Psychiatric/Behavioral: Negative for behavioral problems.    Allergies  Review of patient's allergies indicates no known allergies.  Home Medications   Current Outpatient Rx  Name Route Sig Dispense Refill  . ALPRAZOLAM 0.5 MG PO TABS Oral Take 0.5 mg by mouth 3 (three) times daily as needed. anxiety    . ASPIRIN EC 81 MG PO TBEC Oral Take 81 mg by mouth daily.      . ATORVASTATIN CALCIUM 40 MG PO TABS Oral Take 40 mg by mouth daily.    Marland Kitchen  COLCHICINE 0.6 MG PO TABS Oral Take 0.6 mg by mouth daily.    Marland Kitchen VITAMIN B 12 PO Oral Take 1,000 mcg by mouth daily.      Marland Kitchen GLUCOSAMINE-CHONDROITIN 500-400 MG PO TABS Oral Take 2 tablets by mouth daily.     Marland Kitchen LISINOPRIL 10 MG PO TABS Oral Take 10 mg by mouth daily.      Marland Kitchen NITROGLYCERIN 0.4 MG SL SUBL Sublingual Place 0.4 mg under the tongue every 5 (five) minutes as needed.      . TRIAMTERENE-HCTZ 37.5-25 MG PO TABS Oral Take 1 tablet by mouth daily.      Marland Kitchen VERAPAMIL HCL 240 MG PO TBCR Oral Take 240 mg by mouth 2 (two) times daily.      . ESTER-C PO Oral Take 1 tablet by mouth daily.    Marland Kitchen  HYDROCODONE-ACETAMINOPHEN 5-500 MG PO TABS Oral Take 1 tablet by mouth every 6 (six) hours as needed for pain. 10 tablet 0  . ONDANSETRON 4 MG PO TBDP  4mg  ODT q4 hours prn nausea/vomit 12 tablet 0  . TAMSULOSIN HCL 0.4 MG PO CAPS Oral Take 1 capsule (0.4 mg total) by mouth daily. 20 capsule 0    BP 103/54  Pulse 67  Temp(Src) 97.5 F (36.4 C) (Oral)  Resp 16  SpO2 99%  Physical Exam  Constitutional: He is oriented to person, place, and time. He appears well-developed and well-nourished. No distress.  HENT:  Head: Normocephalic and atraumatic.  Eyes: Conjunctivae and EOM are normal. Pupils are equal, round, and reactive to light. No scleral icterus.  Neck: Normal range of motion. Neck supple.  Cardiovascular: Normal rate and regular rhythm.  Exam reveals no gallop and no friction rub.   No murmur heard. Pulmonary/Chest: Effort normal and breath sounds normal. No respiratory distress. He has no wheezes. He has no rales. He exhibits no tenderness.  Abdominal: Soft. He exhibits no distension and no mass. There is tenderness (mild L flank). There is no rebound and no guarding.  Musculoskeletal: Normal range of motion. He exhibits no edema and no tenderness.  Neurological: He is alert and oriented to person, place, and time. He has normal reflexes. No cranial nerve deficit. He exhibits normal muscle tone. Coordination normal.  Skin: Skin is warm and dry. No rash noted. He is not diaphoretic. No erythema.  Psychiatric: He has a normal mood and affect. His behavior is normal. Judgment and thought content normal.    ED Course  Procedures (including critical care time)  Labs Reviewed  URINALYSIS, ROUTINE W REFLEX MICROSCOPIC - Abnormal; Notable for the following:    Color, Urine AMBER (*) BIOCHEMICALS MAY BE AFFECTED BY COLOR   APPearance CLOUDY (*)    Hgb urine dipstick LARGE (*)    Protein, ur 30 (*)    All other components within normal limits  CBC - Abnormal; Notable for the  following:    RBC 3.05 (*)    Hemoglobin 10.0 (*)    HCT 29.4 (*)    Platelets 120 (*)    All other components within normal limits  DIFFERENTIAL - Abnormal; Notable for the following:    Neutrophils Relative 83 (*)    Lymphocytes Relative 10 (*)    All other components within normal limits  BASIC METABOLIC PANEL - Abnormal; Notable for the following:    Glucose, Bld 121 (*)    BUN 45 (*)    Creatinine, Ser 1.71 (*)    GFR calc non Af Amer 39 (*)  GFR calc Af Amer 45 (*)    All other components within normal limits  PROTIME-INR  URINE MICROSCOPIC-ADD ON  URINE CULTURE   Ct Abdomen Pelvis Wo Contrast  03/10/2012  *RADIOLOGY REPORT*  Clinical Data: Hematuria and left flank pain.  CT ABDOMEN AND PELVIS WITHOUT CONTRAST  Technique:  Multidetector CT imaging of the abdomen and pelvis was performed following the standard protocol without intravenous contrast.  Comparison: Chest CT 02/10/2012  Findings: Lung bases are clear.  No evidence for free intraperitoneal air.  There is perinephric stranding, left side greater than right. There is mild dilatation of the left renal collecting system and left ureter.  There is also edema around the proximal left ureter on image 48.  No evidence for a stone.  Normal appearance of the right renal collecting system.  The patient has prostatic radiation seeds.  No definite stones within the urinary bladder.  No gross abnormality to the liver, gallbladder, spleen, pancreas or adrenal glands.  Incidentally, the left great saphenous vein is slightly prominent and this can be associated with venous insufficiency in the left lower extremity.  There is marked sclerosis and subchondral cyst formation along the posterior right femoral head.  Findings are probably degenerative in etiology.  No acute bony abnormality. No evidence for lymphadenopathy or free fluid.  IMPRESSION:  Mild left hydroureteronephrosis with left perinephric stranding and focal edema around the  proximal left ureter.  The findings are concerning for an inflammatory process or obstructive process within the left renal collecting system.  However, there is no evidence for a radiopaque stone or obvious lesion.  The left renal collecting system could be further evaluated with a retrograde examination or a contrast enhanced CT using a hematuria protocol.  Original Report Authenticated By: Richarda Overlie, M.D.     1. Nephrolithiasis       MDM  Hx of prostate cancer and kidney stones.  Hematuria and mild pain in L flank/L side today.  No lightheadedness.  Otherwise doing well.  Labs show slightly elevated Cr but not concerningly high.  CT c/w mild L hydronephrosis.  Likely non radioopaque stone vs recently passed stone.  D/w urology - Dr. Annabell Howells - plan for flomax, pain meds, and f/u with Dr. Dennie Fetters - pt's urologist in a few days.  Pt comfortable with plan and will follow up.  Pt asymptomatic at time of d/c.        Army Chaco, MD 03/10/12 [phone]

## 2012-03-10 NOTE — Discharge Instructions (Signed)
Kidney Stones Kidney stones (ureteral lithiasis) are solid masses that form inside your kidneys. The intense pain is caused by the stone moving through the kidney, ureter, bladder, and urethra (urinary tract). When the stone moves, the ureter starts to spasm around the stone. The stone is usually passed in the urine.  HOME CARE  Drink enough fluids to keep your pee (urine) clear or pale yellow. This helps to get the stone out.   Strain all pee through the provided strainer. Do not pee without peeing through the strainer, not even once. If you pee the stone out, catch it. The stone may be as small as a grain of salt. Take this to your doctor.   Only take medicine as told by your doctor.   Follow up with your doctor as told.   Get follow-up X-rays as told by your doctor.  GET HELP RIGHT AWAY IF:   Your pain does not get better with medicine.   You have a fever.   Your pain increases and gets worse over 18 hours.   You have new belly (abdominal) pain.   You feel faint or pass out.  MAKE SURE YOU:   Understand these instructions.   Will watch your condition.   Will get help right away if you are not doing well or get worse.  Document Released: 03/21/2008 Document Revised: 09/22/2011 Document Reviewed: 07/31/2009 [employer] Patient Information 2012 [employer]. 

## 2012-03-10 NOTE — ED Notes (Signed)
Pt. Was up all night having UTI symptoms burning hematuria and frequency.

## 2012-03-11 NOTE — ED Provider Notes (Signed)
I saw and evaluated the patient, reviewed the resident's note and I agree with the findings and plan.   .Face to face Exam:  General:  Awake HEENT:  Atraumatic Resp:  Normal effort Abd:  Nondistended Neuro:No focal weakness Lymph: No adenopathy   Avila Albritton L Sianne Tejada, MD 03/11/12 1025 

## 2012-03-12 LAB — URINE CULTURE
Colony Count: NO GROWTH
Culture  Setup Time: 201305260541

## 2012-03-15 ENCOUNTER — Ambulatory Visit
Admission: RE | Admit: 2012-03-15 | Discharge: 2012-03-15 | Disposition: A | Discharge status: Home / Self Care | Payer: BC Managed Care – PPO | Source: Ambulatory Visit | Attending: Family Medicine | Admitting: Family Medicine

## 2012-03-15 ENCOUNTER — Other Ambulatory Visit: Payer: Self-pay | Admitting: Family Medicine

## 2012-03-15 DIAGNOSIS — J189 Pneumonia, unspecified organism: Secondary | ICD-10-CM | POA: Diagnosis not present

## 2012-05-31 ENCOUNTER — Telehealth: Payer: Self-pay | Admitting: Oncology

## 2012-05-31 NOTE — Telephone Encounter (Signed)
Moved 8/29 appt to 10/8. S/w pt he is aware.

## 2012-06-14 ENCOUNTER — Ambulatory Visit: Payer: BC Managed Care – PPO | Admitting: Oncology

## 2012-06-14 ENCOUNTER — Other Ambulatory Visit: Payer: BC Managed Care – PPO | Admitting: Lab

## 2012-07-05 DIAGNOSIS — M109 Gout, unspecified: Secondary | ICD-10-CM | POA: Diagnosis not present

## 2012-07-05 DIAGNOSIS — Z79899 Other long term (current) drug therapy: Secondary | ICD-10-CM | POA: Diagnosis not present

## 2012-07-05 DIAGNOSIS — R609 Edema, unspecified: Secondary | ICD-10-CM | POA: Diagnosis not present

## 2012-07-05 DIAGNOSIS — M2550 Pain in unspecified joint: Secondary | ICD-10-CM | POA: Diagnosis not present

## 2012-07-11 DIAGNOSIS — M549 Dorsalgia, unspecified: Secondary | ICD-10-CM | POA: Diagnosis not present

## 2012-07-11 DIAGNOSIS — Z23 Encounter for immunization: Secondary | ICD-10-CM | POA: Diagnosis not present

## 2012-07-16 ENCOUNTER — Other Ambulatory Visit: Payer: Self-pay | Admitting: Family Medicine

## 2012-07-16 DIAGNOSIS — M5430 Sciatica, unspecified side: Secondary | ICD-10-CM

## 2012-07-16 DIAGNOSIS — M549 Dorsalgia, unspecified: Secondary | ICD-10-CM

## 2012-07-16 DIAGNOSIS — I10 Essential (primary) hypertension: Secondary | ICD-10-CM | POA: Diagnosis not present

## 2012-07-17 ENCOUNTER — Other Ambulatory Visit: Payer: BC Managed Care – PPO

## 2012-07-20 ENCOUNTER — Ambulatory Visit
Admission: RE | Admit: 2012-07-20 | Discharge: 2012-07-20 | Disposition: A | Discharge status: Home / Self Care | Payer: Medicare Other | Source: Ambulatory Visit | Attending: Family Medicine | Admitting: Family Medicine

## 2012-07-20 DIAGNOSIS — M5430 Sciatica, unspecified side: Secondary | ICD-10-CM

## 2012-07-20 DIAGNOSIS — Z1389 Encounter for screening for other disorder: Secondary | ICD-10-CM | POA: Diagnosis not present

## 2012-07-20 DIAGNOSIS — M549 Dorsalgia, unspecified: Secondary | ICD-10-CM

## 2012-07-20 DIAGNOSIS — M5126 Other intervertebral disc displacement, lumbar region: Secondary | ICD-10-CM | POA: Diagnosis not present

## 2012-07-24 ENCOUNTER — Telehealth: Payer: Self-pay | Admitting: Oncology

## 2012-07-24 ENCOUNTER — Ambulatory Visit (HOSPITAL_BASED_OUTPATIENT_CLINIC_OR_DEPARTMENT_OTHER): Payer: Medicare Other | Admitting: Oncology

## 2012-07-24 ENCOUNTER — Other Ambulatory Visit (HOSPITAL_BASED_OUTPATIENT_CLINIC_OR_DEPARTMENT_OTHER): Payer: Medicare Other | Admitting: Lab

## 2012-07-24 VITALS — BP 135/78 | HR 61 | Temp 96.8°F | Resp 20 | Ht 67.0 in | Wt 177.9 lb

## 2012-07-24 DIAGNOSIS — R799 Abnormal finding of blood chemistry, unspecified: Secondary | ICD-10-CM | POA: Diagnosis not present

## 2012-07-24 DIAGNOSIS — C3490 Malignant neoplasm of unspecified part of unspecified bronchus or lung: Secondary | ICD-10-CM

## 2012-07-24 DIAGNOSIS — C61 Malignant neoplasm of prostate: Secondary | ICD-10-CM

## 2012-07-24 LAB — IRON AND TIBC
Iron: 268 ug/dL — ABNORMAL HIGH (ref 42–165)
UIBC: <15 ug/dL — ABNORMAL LOW (ref 125–400)

## 2012-07-24 LAB — CBC WITH DIFFERENTIAL/PLATELET
BASO%: 0.5 % (ref 0.0–2.0)
HCT: 43.1 % (ref 38.4–49.9)
MCHC: 35.1 g/dL (ref 32.0–36.0)
MONO#: 0.5 10*3/uL (ref 0.1–0.9)
NEUT%: 55.6 % (ref 39.0–75.0)
WBC: 5 10*3/uL (ref 4.0–10.3)
lymph#: 1.6 10*3/uL (ref 0.9–3.3)

## 2012-07-24 LAB — COMPREHENSIVE METABOLIC PANEL (CC13)
AST: 19 U/L (ref 5–34)
Alkaline Phosphatase: 56 U/L (ref 40–150)
BUN: 20 mg/dL (ref 7.0–26.0)
Creatinine: 1 mg/dL (ref 0.7–1.3)
Total Bilirubin: 1.3 mg/dL — ABNORMAL HIGH (ref 0.20–1.20)

## 2012-07-24 NOTE — Progress Notes (Signed)
Hematology and Oncology Follow Up Visit  Daniel Vega [id number] 09-28-1941 71 y.o. 07/24/2012 9:11 AM   Principle Diagnosis: This is a 71 year old gentleman with an elevated ferritin level. Differential diagnosis including a history of alcohol use versus questionable hemochromatosis.  Current therapy: Observation and surveillance   Interim History:  Daniel Vega is a pleasant 71 year old gentleman who I saw for the first time back in November of 2012 for the  evaluation for possible elevated iron levels.  When I saw him last time in 11/2011, his hemoglobin was normal.  He had normal differential with a normal MCV. His iron levels were repeated. He had  iron of 195. His ferritin is elevated at 572 that has improved from 679. Since his last visit, he was hospitalized in 01/2012 for CAP and has recovered since that time. He is asymptomatic and he is not reporting any symptoms of headaches or blurry vision. He has not reported any major changes in his performance status or activity level.   Medications: I have reviewed the patient's current medications. Current outpatient prescriptions:ALPRAZolam (XANAX) 0.5 MG tablet, Take 0.5 mg by mouth 3 (three) times daily as needed. anxiety, Disp: , Rfl: ;  aspirin EC 81 MG tablet, Take 81 mg by mouth daily.  , Disp: , Rfl: ;  atorvastatin (LIPITOR) 40 MG tablet, Take 40 mg by mouth daily., Disp: , Rfl: ;  colchicine 0.6 MG tablet, Take 0.6 mg by mouth daily., Disp: , Rfl: ;  Cyanocobalamin (VITAMIN B 12 PO), Take 1,000 mcg by mouth daily.  , Disp: , Rfl:  glucosamine-chondroitin 500-400 MG tablet, Take 2 tablets by mouth daily. , Disp: , Rfl: ;  lisinopril (PRINIVIL,ZESTRIL) 10 MG tablet, Take 10 mg by mouth daily.  , Disp: , Rfl: ;  nitroGLYCERIN (NITROSTAT) 0.4 MG SL tablet, Place 0.4 mg under the tongue every 5 (five) minutes as needed.  , Disp: , Rfl: ;  Tamsulosin HCl (FLOMAX) 0.4 MG CAPS, Take 1 capsule (0.4 mg total) by mouth daily., Disp: 20 capsule, Rfl:  0 triamterene-hydrochlorothiazide (MAXZIDE-25) 37.5-25 MG per tablet, Take 1 tablet by mouth daily.  , Disp: , Rfl: ;  verapamil (CALAN-SR) 240 MG CR tablet, Take 240 mg by mouth 2 (two) times daily.  , Disp: , Rfl: ;  Vitamin Mixture (ESTER-C PO), Take 1 tablet by mouth daily., Disp: , Rfl:   Allergies: No Known Allergies  Past Medical History, Surgical history, Social history, and Family History were reviewed and updated.  Review of Systems: Constitutional:  Negative for fever, chills, night sweats, anorexia, weight loss, pain. Cardiovascular: no chest pain or dyspnea on exertion Respiratory: negative Neurological: no TIA or stroke symptoms Dermatological: negative ENT: negative Skin: Negative. Gastrointestinal: negative Genito-Urinary: negative Hematological and Lymphatic: negative Breast: negative Musculoskeletal: negative Remaining ROS negative. Physical Exam: Blood pressure 135/78, pulse 61, temperature 96.8 F (36 C), temperature source Oral, resp. rate 20, height 5\' 7"  (1.702 m), weight 177 lb 14.4 oz (80.695 kg). ECOG: 1 General appearance: alert Head: Normocephalic, without obvious abnormality, atraumatic Neck: no adenopathy, no carotid bruit, no JVD, supple, symmetrical, trachea midline and thyroid not enlarged, symmetric, no tenderness/mass/nodules Lymph nodes: Cervical, supraclavicular, and axillary nodes normal. Heart:regular rate and rhythm, S1, S2 normal, no murmur, click, rub or gallop Lung:chest clear, no wheezing, rales, normal symmetric air entry Abdomin: soft, non-tender, without masses or organomegaly EXT:no erythema, induration, or nodules   Lab Results: Lab Results  Component Value Date   WBC 5.0 07/24/2012   HGB 15.1  07/24/2012   HCT 43.1 07/24/2012   MCV 94.6 07/24/2012   PLT 111* 07/24/2012     Chemistry      Component Value Date/Time   NA 135 03/10/2012 1505   K 4.9 03/10/2012 1505   CL 103 03/10/2012 1505   CO2 21 03/10/2012 1505   BUN 45*  03/10/2012 1505   CREATININE 1.71* 03/10/2012 1505      Component Value Date/Time   CALCIUM 8.9 03/10/2012 1505   ALKPHOS 47 02/10/2012 0415   AST 20 02/10/2012 0415   ALT 14 02/10/2012 0415   BILITOT 0.5 02/10/2012 0415       Impression and Plan:  71 year old gentleman with the following issues.  1. Elevated ferritin level. The differential diagnosis at this time include possible elevation due to acute phase reactant. Ferritin  is known to have elevation in chronic inflammatory conditions. Also alcohol consumption can play a role as well. I doubt he has  genetic hemochromatosis. His liver function tests are fairly normal. However, I am repeating it today. At this time he does  not really need any hematological intervention.  2. Elevated mean corpuscular volume that has resolved at this time. His MCV is perfectly normal.  3. Followup. I will continue to monitor his blood counts. I am going to repeat his iron levels as well as a CBC in 6 months.   Ellesse Antenucci, MD 10/8/20139:11 AM

## 2012-07-24 NOTE — Telephone Encounter (Signed)
appts made and printed for pt aom °

## 2012-07-26 DIAGNOSIS — IMO0002 Reserved for concepts with insufficient information to code with codable children: Secondary | ICD-10-CM | POA: Diagnosis not present

## 2012-07-26 DIAGNOSIS — M545 Low back pain: Secondary | ICD-10-CM | POA: Diagnosis not present

## 2012-07-26 DIAGNOSIS — M5126 Other intervertebral disc displacement, lumbar region: Secondary | ICD-10-CM | POA: Diagnosis not present

## 2012-07-31 ENCOUNTER — Other Ambulatory Visit: Payer: Self-pay | Admitting: Neurosurgery

## 2012-08-02 ENCOUNTER — Encounter (HOSPITAL_COMMUNITY): Payer: Self-pay | Admitting: Pharmacy Technician

## 2012-08-06 DIAGNOSIS — Z79899 Other long term (current) drug therapy: Secondary | ICD-10-CM | POA: Diagnosis not present

## 2012-08-06 DIAGNOSIS — R7309 Other abnormal glucose: Secondary | ICD-10-CM | POA: Diagnosis not present

## 2012-08-06 DIAGNOSIS — E7800 Pure hypercholesterolemia, unspecified: Secondary | ICD-10-CM | POA: Diagnosis not present

## 2012-08-06 DIAGNOSIS — M5126 Other intervertebral disc displacement, lumbar region: Secondary | ICD-10-CM | POA: Diagnosis not present

## 2012-08-06 DIAGNOSIS — I2510 Atherosclerotic heart disease of native coronary artery without angina pectoris: Secondary | ICD-10-CM | POA: Diagnosis not present

## 2012-08-06 DIAGNOSIS — Z85118 Personal history of other malignant neoplasm of bronchus and lung: Secondary | ICD-10-CM | POA: Diagnosis not present

## 2012-08-06 DIAGNOSIS — I10 Essential (primary) hypertension: Secondary | ICD-10-CM | POA: Diagnosis not present

## 2012-08-06 DIAGNOSIS — M109 Gout, unspecified: Secondary | ICD-10-CM | POA: Diagnosis not present

## 2012-08-07 ENCOUNTER — Encounter (HOSPITAL_COMMUNITY)
Admission: RE | Admit: 2012-08-07 | Discharge: 2012-08-07 | Disposition: A | Discharge status: Home / Self Care | Payer: Medicare Other | Source: Ambulatory Visit | Attending: Neurosurgery | Admitting: Neurosurgery

## 2012-08-07 ENCOUNTER — Encounter (HOSPITAL_COMMUNITY): Payer: Self-pay

## 2012-08-07 DIAGNOSIS — Z01812 Encounter for preprocedural laboratory examination: Secondary | ICD-10-CM | POA: Diagnosis not present

## 2012-08-07 DIAGNOSIS — I10 Essential (primary) hypertension: Secondary | ICD-10-CM | POA: Diagnosis not present

## 2012-08-07 DIAGNOSIS — Z01810 Encounter for preprocedural cardiovascular examination: Secondary | ICD-10-CM | POA: Diagnosis not present

## 2012-08-07 DIAGNOSIS — M5126 Other intervertebral disc displacement, lumbar region: Secondary | ICD-10-CM | POA: Diagnosis not present

## 2012-08-07 LAB — CBC
HCT: 41.4 % (ref 39.0–52.0)
Hemoglobin: 14.4 g/dL (ref 13.0–17.0)
MCHC: 34.8 g/dL (ref 30.0–36.0)

## 2012-08-07 LAB — COMPREHENSIVE METABOLIC PANEL
ALT: 27 U/L (ref 0–53)
AST: 21 U/L (ref 0–37)
Albumin: 3.6 g/dL (ref 3.5–5.2)
Calcium: 9.4 mg/dL (ref 8.4–10.5)
GFR calc Af Amer: >90 mL/min (ref >90)
Glucose, Bld: 101 mg/dL — ABNORMAL HIGH (ref 70–99)
Sodium: 138 mEq/L (ref 135–145)
Total Protein: 6.5 g/dL (ref 6.0–8.3)

## 2012-08-07 LAB — SURGICAL PCR SCREEN
MRSA, PCR: NEGATIVE
Staphylococcus aureus: NEGATIVE

## 2012-08-07 NOTE — Pre-Procedure Instructions (Signed)
20 JACOBIE STAMEY  08/07/2012   Your procedure is scheduled on: October 25  Report to [facility] at 09:15 AM.  Call this number if you have problems the morning of surgery: [phone]   Remember:   Do not eat or drink:After Midnight.  Take these medicines the morning of surgery with A SIP OF WATER: Xanax (if needed), Colchicine, Oxycodone (if needed), Verapamil   STOP Glucosamine, Vitamin B12 today  Do not wear jewelry, make-up or nail polish.  Do not wear lotions, powders, or perfumes. You may wear deodorant.  Do not shave 48 hours prior to surgery. Men may shave face and neck.  Do not bring valuables to the hospital.  Contacts, dentures or bridgework may not be worn into surgery.  Leave suitcase in the car. After surgery it may be brought to your room.  For patients admitted to the hospital, checkout time is 11:00 AM the day of discharge.   Patients discharged the day of surgery will not be allowed to drive home.  Name and phone number of your driver: Family  Special Instructions: Shower using CHG 2 nights before surgery and the night before surgery.  If you shower the day of surgery use CHG.  Use special wash - you have one bottle of CHG for all showers.  You should use approximately 1/3 of the bottle for each shower.   Please read over the following fact sheets that you were given: Pain Booklet, Coughing and Deep Breathing and Surgical Site Infection Prevention

## 2012-08-08 NOTE — Consult Note (Signed)
Anesthesiology chart review:  Daniel Vega is a 71-year-old male scheduled L5-S1 discectomy: 08/10/2012 by Dr. Mikal Plane. He has a history of coronary artery disease.  Cardiac catheterization on 02/11/2012:  1. Single-vessel occlusive atherosclerotic coronary artery disease  involving the distal right coronary artery with  excellent collateral flow.  2. Good left ventricular systolic function.  He denies using nitroglycerin. He will be evaluated on the morning of surgery the anesthesiologist assigned to his case he appears to be an acceptable risk for surgery.  Kipp Brood, M.D.

## 2012-08-09 MED ORDER — CEFAZOLIN SODIUM-DEXTROSE 2-3 GM-% IV SOLR
2.0000 g | INTRAVENOUS | Status: AC
  Administered 2012-08-10: 2 g via INTRAVENOUS

## 2012-08-10 ENCOUNTER — Encounter (HOSPITAL_COMMUNITY): Payer: Self-pay | Admitting: Anesthesiology

## 2012-08-10 ENCOUNTER — Inpatient Hospital Stay (HOSPITAL_COMMUNITY)
Admission: RE | Admit: 2012-08-10 | Discharge: 2012-08-11 | DRG: 491 | Disposition: A | Discharge status: Home / Self Care | Payer: Medicare Other | Source: Ambulatory Visit | Attending: Neurosurgery | Admitting: Neurosurgery

## 2012-08-10 ENCOUNTER — Ambulatory Visit (HOSPITAL_COMMUNITY): Payer: Medicare Other | Admitting: Anesthesiology

## 2012-08-10 ENCOUNTER — Encounter (HOSPITAL_COMMUNITY)
Admission: RE | Disposition: A | Discharge status: Home / Self Care | Payer: Self-pay | Source: Ambulatory Visit | Attending: Neurosurgery

## 2012-08-10 ENCOUNTER — Ambulatory Visit (HOSPITAL_COMMUNITY): Payer: Medicare Other

## 2012-08-10 ENCOUNTER — Encounter (HOSPITAL_COMMUNITY): Payer: Self-pay | Admitting: *Deleted

## 2012-08-10 DIAGNOSIS — Z01812 Encounter for preprocedural laboratory examination: Secondary | ICD-10-CM

## 2012-08-10 DIAGNOSIS — Z01810 Encounter for preprocedural cardiovascular examination: Secondary | ICD-10-CM | POA: Diagnosis not present

## 2012-08-10 DIAGNOSIS — Z87891 Personal history of nicotine dependence: Secondary | ICD-10-CM

## 2012-08-10 DIAGNOSIS — E7800 Pure hypercholesterolemia, unspecified: Secondary | ICD-10-CM | POA: Diagnosis present

## 2012-08-10 DIAGNOSIS — M519 Unspecified thoracic, thoracolumbar and lumbosacral intervertebral disc disorder: Secondary | ICD-10-CM | POA: Diagnosis not present

## 2012-08-10 DIAGNOSIS — Z85118 Personal history of other malignant neoplasm of bronchus and lung: Secondary | ICD-10-CM

## 2012-08-10 DIAGNOSIS — M5126 Other intervertebral disc displacement, lumbar region: Principal | ICD-10-CM | POA: Diagnosis present

## 2012-08-10 DIAGNOSIS — IMO0002 Reserved for concepts with insufficient information to code with codable children: Secondary | ICD-10-CM | POA: Diagnosis not present

## 2012-08-10 DIAGNOSIS — M109 Gout, unspecified: Secondary | ICD-10-CM | POA: Diagnosis present

## 2012-08-10 DIAGNOSIS — I10 Essential (primary) hypertension: Secondary | ICD-10-CM | POA: Diagnosis present

## 2012-08-10 DIAGNOSIS — Z8546 Personal history of malignant neoplasm of prostate: Secondary | ICD-10-CM

## 2012-08-10 DIAGNOSIS — Z87442 Personal history of urinary calculi: Secondary | ICD-10-CM

## 2012-08-10 DIAGNOSIS — I252 Old myocardial infarction: Secondary | ICD-10-CM

## 2012-08-10 DIAGNOSIS — Z79899 Other long term (current) drug therapy: Secondary | ICD-10-CM

## 2012-08-10 HISTORY — PX: LUMBAR LAMINECTOMY/DECOMPRESSION MICRODISCECTOMY: SHX5026

## 2012-08-10 SURGERY — LUMBAR LAMINECTOMY/DECOMPRESSION MICRODISCECTOMY 1 LEVEL
Anesthesia: General | Site: Back | Laterality: Left | Wound class: Clean

## 2012-08-10 MED ORDER — LIDOCAINE HCL (CARDIAC) 20 MG/ML IV SOLN
INTRAVENOUS | Status: DC | PRN
  Administered 2012-08-10: 80 mg via INTRAVENOUS

## 2012-08-10 MED ORDER — THROMBIN 5000 UNITS EX KIT
PACK | CUTANEOUS | Status: DC | PRN
  Administered 2012-08-10 (×2): 5000 [IU] via TOPICAL

## 2012-08-10 MED ORDER — LACTATED RINGERS IV SOLN
INTRAVENOUS | Status: DC | PRN
  Administered 2012-08-10 (×3): via INTRAVENOUS

## 2012-08-10 MED ORDER — LIDOCAINE-EPINEPHRINE 0.5 %-1:200000 IJ SOLN
INTRAMUSCULAR | Status: DC | PRN
  Administered 2012-08-10: 10 mL

## 2012-08-10 MED ORDER — KETOROLAC TROMETHAMINE 30 MG/ML IJ SOLN
15.0000 mg | Freq: Four times a day (QID) | INTRAMUSCULAR | Status: DC
  Administered 2012-08-10 – 2012-08-11 (×3): 15 mg via INTRAVENOUS
  Filled 2012-08-10 (×8): qty 1

## 2012-08-10 MED ORDER — CEFAZOLIN SODIUM-DEXTROSE 2-3 GM-% IV SOLR
INTRAVENOUS | Status: AC
  Filled 2012-08-10: qty 50

## 2012-08-10 MED ORDER — ONDANSETRON HCL 4 MG/2ML IJ SOLN
INTRAMUSCULAR | Status: DC | PRN
  Administered 2012-08-10: 4 mg via INTRAVENOUS

## 2012-08-10 MED ORDER — MENTHOL 3 MG MT LOZG: 1.0000 | LOZENGE | OROMUCOSAL | Status: DC | PRN

## 2012-08-10 MED ORDER — 0.9 % SODIUM CHLORIDE (POUR BTL) OPTIME
TOPICAL | Status: DC | PRN
  Administered 2012-08-10: 1000 mL

## 2012-08-10 MED ORDER — GLYCOPYRROLATE 0.2 MG/ML IJ SOLN
INTRAMUSCULAR | Status: DC | PRN
  Administered 2012-08-10: .4 mg via INTRAVENOUS

## 2012-08-10 MED ORDER — HEMOSTATIC AGENTS (NO CHARGE) OPTIME
TOPICAL | Status: DC | PRN
  Administered 2012-08-10: 1 via TOPICAL

## 2012-08-10 MED ORDER — GLUCOSAMINE-CHONDROITIN 500-400 MG PO TABS
2.0000 | ORAL_TABLET | Freq: Every day | ORAL | Status: DC
Start: 2012-08-10 — End: 2012-08-10

## 2012-08-10 MED ORDER — PHENOL 1.4 % MT LIQD: 1.0000 | OROMUCOSAL | Status: DC | PRN

## 2012-08-10 MED ORDER — HYDROMORPHONE HCL PF 1 MG/ML IJ SOLN
INTRAMUSCULAR | Status: AC
  Filled 2012-08-10: qty 1

## 2012-08-10 MED ORDER — SODIUM CHLORIDE 0.9 % IJ SOLN: 3.0000 mL | INTRAMUSCULAR | Status: DC | PRN

## 2012-08-10 MED ORDER — BISACODYL 5 MG PO TBEC
5.0000 mg | DELAYED_RELEASE_TABLET | Freq: Every day | ORAL | Status: DC | PRN
  Filled 2012-08-10: qty 1

## 2012-08-10 MED ORDER — ROCURONIUM BROMIDE 100 MG/10ML IV SOLN
INTRAVENOUS | Status: DC | PRN
  Administered 2012-08-10: 50 mg via INTRAVENOUS

## 2012-08-10 MED ORDER — DOCUSATE SODIUM 100 MG PO CAPS
100.0000 mg | ORAL_CAPSULE | Freq: Two times a day (BID) | ORAL | Status: DC
  Administered 2012-08-10: 100 mg via ORAL
  Filled 2012-08-10: qty 1

## 2012-08-10 MED ORDER — POTASSIUM CHLORIDE IN NACL 20-0.9 MEQ/L-% IV SOLN
INTRAVENOUS | Status: DC
  Filled 2012-08-10 (×3): qty 1000

## 2012-08-10 MED ORDER — VERAPAMIL HCL ER 240 MG PO TBCR
240.0000 mg | EXTENDED_RELEASE_TABLET | Freq: Two times a day (BID) | ORAL | Status: DC
  Filled 2012-08-10 (×2): qty 1

## 2012-08-10 MED ORDER — SENNOSIDES-DOCUSATE SODIUM 8.6-50 MG PO TABS
1.0000 | ORAL_TABLET | Freq: Every evening | ORAL | Status: DC | PRN
  Filled 2012-08-10: qty 1

## 2012-08-10 MED ORDER — NEOSTIGMINE METHYLSULFATE 1 MG/ML IJ SOLN
INTRAMUSCULAR | Status: DC | PRN
  Administered 2012-08-10: 3 mg via INTRAVENOUS

## 2012-08-10 MED ORDER — ACETAMINOPHEN 10 MG/ML IV SOLN
1000.0000 mg | Freq: Four times a day (QID) | INTRAVENOUS | Status: DC
  Administered 2012-08-10 – 2012-08-11 (×2): 1000 mg via INTRAVENOUS
  Filled 2012-08-10 (×4): qty 100

## 2012-08-10 MED ORDER — ALPRAZOLAM 0.5 MG PO TABS: 0.5000 mg | ORAL_TABLET | Freq: Three times a day (TID) | ORAL | Status: DC | PRN

## 2012-08-10 MED ORDER — HYDROMORPHONE HCL PF 1 MG/ML IJ SOLN
0.2500 mg | INTRAMUSCULAR | Status: DC | PRN
  Administered 2012-08-10: 0.5 mg via INTRAVENOUS

## 2012-08-10 MED ORDER — ATORVASTATIN CALCIUM 40 MG PO TABS
40.0000 mg | ORAL_TABLET | Freq: Every day | ORAL | Status: DC
  Administered 2012-08-10: 40 mg via ORAL
  Filled 2012-08-10 (×2): qty 1

## 2012-08-10 MED ORDER — PROPOFOL 10 MG/ML IV BOLUS
INTRAVENOUS | Status: DC | PRN
  Administered 2012-08-10: 150 mg via INTRAVENOUS

## 2012-08-10 MED ORDER — COLCHICINE 0.6 MG PO TABS
0.6000 mg | ORAL_TABLET | Freq: Every day | ORAL | Status: DC
  Filled 2012-08-10: qty 1

## 2012-08-10 MED ORDER — NITROGLYCERIN 0.4 MG SL SUBL: 0.4000 mg | SUBLINGUAL_TABLET | SUBLINGUAL | Status: DC | PRN

## 2012-08-10 MED ORDER — KETOROLAC TROMETHAMINE 30 MG/ML IJ SOLN
INTRAMUSCULAR | Status: AC
  Filled 2012-08-10: qty 1

## 2012-08-10 MED ORDER — FENTANYL CITRATE 0.05 MG/ML IJ SOLN
INTRAMUSCULAR | Status: DC | PRN
  Administered 2012-08-10: 100 ug via INTRAVENOUS
  Administered 2012-08-10 (×2): 50 ug via INTRAVENOUS

## 2012-08-10 MED ORDER — SODIUM CHLORIDE 0.9 % IJ SOLN
3.0000 mL | Freq: Two times a day (BID) | INTRAMUSCULAR | Status: DC
  Administered 2012-08-10: 3 mL via INTRAVENOUS

## 2012-08-10 MED ORDER — SODIUM CHLORIDE 0.9 % IV SOLN: 250.0000 mL | INTRAVENOUS | Status: DC

## 2012-08-10 MED ORDER — ONDANSETRON HCL 4 MG/2ML IJ SOLN: 4.0000 mg | INTRAMUSCULAR | Status: DC | PRN

## 2012-08-10 MED ORDER — ALLOPURINOL 100 MG PO TABS
100.0000 mg | ORAL_TABLET | Freq: Every day | ORAL | Status: DC
  Administered 2012-08-10: 100 mg via ORAL
  Filled 2012-08-10 (×2): qty 1

## 2012-08-10 MED ORDER — PROMETHAZINE HCL 25 MG/ML IJ SOLN: 6.2500 mg | INTRAMUSCULAR | Status: DC | PRN

## 2012-08-10 SURGICAL SUPPLY — 60 items
ADH SKN CLS APL DERMABOND .7 (GAUZE/BANDAGES/DRESSINGS) ×1
ADH SKN CLS LQ APL DERMABOND (GAUZE/BANDAGES/DRESSINGS) ×1
APL SKNCLS STERI-STRIP NONHPOA (GAUZE/BANDAGES/DRESSINGS)
BAG DECANTER FOR FLEXI CONT (MISCELLANEOUS) ×1 IMPLANT
BENZOIN TINCTURE PRP APPL 2/3 (GAUZE/BANDAGES/DRESSINGS) IMPLANT
BLADE SURG ROTATE 9660 (MISCELLANEOUS) IMPLANT
BUR MATCHSTICK NEURO 3.0 LAGG (BURR) ×2 IMPLANT
CANISTER SUCTION 2500CC (MISCELLANEOUS) ×2 IMPLANT
CLOTH BEACON ORANGE TIMEOUT ST (SAFETY) ×2 IMPLANT
CONT SPEC 4OZ CLIKSEAL STRL BL (MISCELLANEOUS) ×2 IMPLANT
DECANTER SPIKE VIAL GLASS SM (MISCELLANEOUS) ×2 IMPLANT
DERMABOND ADHESIVE PROPEN (GAUZE/BANDAGES/DRESSINGS) ×1
DERMABOND ADVANCED (GAUZE/BANDAGES/DRESSINGS) ×1
DERMABOND ADVANCED .7 DNX12 (GAUZE/BANDAGES/DRESSINGS) ×1 IMPLANT
DERMABOND ADVANCED .7 DNX6 (GAUZE/BANDAGES/DRESSINGS) IMPLANT
DRAPE LAPAROTOMY 100X72X124 (DRAPES) ×2 IMPLANT
DRAPE MICROSCOPE LEICA (MISCELLANEOUS) ×1 IMPLANT
DRAPE MICROSCOPE ZEISS OPMI (DRAPES) ×1 IMPLANT
DRAPE POUCH INSTRU U-SHP 10X18 (DRAPES) ×2 IMPLANT
DRAPE SURG 17X23 STRL (DRAPES) ×2 IMPLANT
DURAPREP 26ML APPLICATOR (WOUND CARE) ×2 IMPLANT
ELECT REM PT RETURN 9FT ADLT (ELECTROSURGICAL) ×2
ELECTRODE REM PT RTRN 9FT ADLT (ELECTROSURGICAL) ×1 IMPLANT
GAUZE SPONGE 4X4 16PLY XRAY LF (GAUZE/BANDAGES/DRESSINGS) IMPLANT
GLOVE BIO SURGEON STRL SZ8.5 (GLOVE) ×1 IMPLANT
GLOVE BIOGEL PI IND STRL 7.5 (GLOVE) IMPLANT
GLOVE BIOGEL PI INDICATOR 7.5 (GLOVE) ×1
GLOVE ECLIPSE 6.5 STRL STRAW (GLOVE) ×2 IMPLANT
GLOVE ECLIPSE 7.5 STRL STRAW (GLOVE) ×2 IMPLANT
GLOVE EXAM NITRILE LRG STRL (GLOVE) IMPLANT
GLOVE EXAM NITRILE MD LF STRL (GLOVE) IMPLANT
GLOVE EXAM NITRILE XL STR (GLOVE) IMPLANT
GLOVE EXAM NITRILE XS STR PU (GLOVE) IMPLANT
GLOVE SS BIOGEL STRL SZ 8 (GLOVE) IMPLANT
GLOVE SUPERSENSE BIOGEL SZ 8 (GLOVE) ×1
GOWN BRE IMP SLV AUR LG STRL (GOWN DISPOSABLE) ×2 IMPLANT
GOWN BRE IMP SLV AUR XL STRL (GOWN DISPOSABLE) ×3 IMPLANT
GOWN STRL REIN 2XL LVL4 (GOWN DISPOSABLE) IMPLANT
KIT BASIN OR (CUSTOM PROCEDURE TRAY) ×2 IMPLANT
KIT ROOM TURNOVER OR (KITS) ×2 IMPLANT
NDL HYPO 25X1 1.5 SAFETY (NEEDLE) ×1 IMPLANT
NDL SPNL 18GX3.5 QUINCKE PK (NEEDLE) IMPLANT
NEEDLE HYPO 25X1 1.5 SAFETY (NEEDLE) ×2 IMPLANT
NEEDLE SPNL 18GX3.5 QUINCKE PK (NEEDLE) ×2 IMPLANT
NS IRRIG 1000ML POUR BTL (IV SOLUTION) ×2 IMPLANT
PACK LAMINECTOMY NEURO (CUSTOM PROCEDURE TRAY) ×2 IMPLANT
PAD ARMBOARD 7.5X6 YLW CONV (MISCELLANEOUS) ×6 IMPLANT
RUBBERBAND STERILE (MISCELLANEOUS) ×4 IMPLANT
SPONGE GAUZE 4X4 12PLY (GAUZE/BANDAGES/DRESSINGS) IMPLANT
SPONGE LAP 4X18 X RAY DECT (DISPOSABLE) IMPLANT
SPONGE SURGIFOAM ABS GEL SZ50 (HEMOSTASIS) ×2 IMPLANT
STRIP CLOSURE SKIN 1/2X4 (GAUZE/BANDAGES/DRESSINGS) IMPLANT
SUT VIC AB 0 CT1 18XCR BRD8 (SUTURE) ×1 IMPLANT
SUT VIC AB 0 CT1 8-18 (SUTURE) ×2
SUT VIC AB 2-0 CT1 18 (SUTURE) ×2 IMPLANT
SUT VIC AB 3-0 SH 8-18 (SUTURE) ×2 IMPLANT
SYR 20ML ECCENTRIC (SYRINGE) ×2 IMPLANT
TOWEL OR 17X24 6PK STRL BLUE (TOWEL DISPOSABLE) ×2 IMPLANT
TOWEL OR 17X26 10 PK STRL BLUE (TOWEL DISPOSABLE) ×2 IMPLANT
WATER STERILE IRR 1000ML POUR (IV SOLUTION) ×2 IMPLANT

## 2012-08-10 NOTE — Preoperative (Signed)
Beta Blockers   Reason not to administer Beta Blockers:Not Applicable 

## 2012-08-10 NOTE — Op Note (Signed)
08/10/2012  5:47 PM  PATIENT:  Daniel Vega  71 y.o. male with severe left lower extremity pain unrelieved by oral nsaids, narcotics, and time. Mri shows a large L5/S1 disc herniation.  PRE-OPERATIVE DIAGNOSIS:  lumbar herniated disc lumbar radiculopathy Left L5/S1  POST-OPERATIVE DIAGNOSIS:  lumbar herniated disc lumbar radiculopathy  PROCEDURE:  Procedure(s): LUMBAR LAMINECTOMY/DECOMPRESSION MICRODISCECTOMY 1 LEVEL L5/S1 microdissection  SURGEON:  Surgeon(s): Carmela Hurt, MD Cristi Loron, MD  ASSISTANTS:Jenkins, Tinnie Gens  ANESTHESIA:   general  EBL:  Total I/O In: 2350 [I.V.:2350] Out: -   BLOOD ADMINISTERED:none  CELL SAVER GIVEN:none  COUNT:per nursing  DRAINS: none   SPECIMEN:  No Specimen  DICTATION: [name] was brought to the operating room intubated and placed under a general anesthetic without difficulty. He was positioned prone on a Wilson frame. All pressure points were properly padded. His back was prepped and draped in sterile fashion. I infiltrated 10cc lidocaine into the planned incision in the lumbar region. I opened the skin with a 10 blade and divided the subcutaneous tissue with monopolar cautery. I exposed the lamina of L5 and S1 on the left side. I confirmed my location with an intraoperative xray. I opened the ligamentum flavum with a 15 blade, then removed it with Kerrison punches. I exposed the thecal sac, then retracted it medially. The microscoped was brought into the operative field to assist with microdissection. With Dr. Lovell Sheehan assistance we first removed what was a free fragment of disc under the thecal sac. I opened the disc space with a 15 blade then performed a discetomy with microscopic dissection. The disc was degenerated and was not tenacious. A great deal of disc and endplate was removed from the space and from the posterior annulus. I was satisfied with the decompression of the spinal canal and thecal sac and the S1 root.  I  irrigated then closed. Hemostasis was achieved. I approximated the thoracolumbar fascia, the subcutaneous and subcuticular tissue with vicryl suture. I used Dermabond for a sterile dressing.   PLAN OF CARE: Admit to inpatient   PATIENT DISPOSITION:  PACU - hemodynamically stable.   Delay start of Pharmacological VTE agent (>24hrs) due to surgical blood loss or risk of bleeding:  yes   L

## 2012-08-10 NOTE — Progress Notes (Signed)
Transported [atoemt pm stretcher on room air, assisted up to bathrm voided without difficulty pain remains a 2 in the back area and no pain in the left calf

## 2012-08-10 NOTE — Progress Notes (Signed)
Dr. Franky Macho at bedside pt stated pain at back of left calf . Pain is not as bad as pre surgery pain

## 2012-08-10 NOTE — Anesthesia Postprocedure Evaluation (Signed)
  Anesthesia Post-op Note  Patient: Daniel Vega  Procedure(s) Performed: Procedure(s) (LRB) with comments: LUMBAR LAMINECTOMY/DECOMPRESSION MICRODISCECTOMY 1 LEVEL (Left) - LEFT Lumbar five-sacral one microdiskectomy  Patient Location: PACU  Anesthesia Type: General  Level of Consciousness: awake, alert  and oriented  Airway and Oxygen Therapy: Patient Spontanous Breathing and Patient connected to nasal cannula oxygen  Post-op Pain: mild  Post-op Assessment: Post-op Vital signs reviewed and Patient's Cardiovascular Status Stable  Post-op Vital Signs: stable  Complications: No apparent anesthesia complications

## 2012-08-10 NOTE — Progress Notes (Signed)
Back pain is a 2 and the left calf pain is gone

## 2012-08-10 NOTE — Transfer of Care (Signed)
Immediate Anesthesia Transfer of Care Note  Patient: Daniel Vega  Procedure(s) Performed: Procedure(s) (LRB) with comments: LUMBAR LAMINECTOMY/DECOMPRESSION MICRODISCECTOMY 1 LEVEL (Left) - LEFT Lumbar five-sacral one microdiskectomy  Patient Location: PACU  Anesthesia Type: General  Level of Consciousness: awake, alert , oriented and patient cooperative  Airway & Oxygen Therapy: Patient Spontanous Breathing and Patient connected to face mask oxygen  Post-op Assessment: Report given to PACU RN, Post -op Vital signs reviewed and stable, Patient moving all extremities and Patient moving all extremities X 4  Post vital signs: Reviewed and stable  Complications: No apparent anesthesia complications

## 2012-08-10 NOTE — H&P (Signed)
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  He says the pain is some of the worst that he has ever experienced before.  He has worked but has not been able to work effectively.  He has had no bowel or bladder dysfunction.  He has significant weakness in his left lower extremity.    He has been taking Percocet and that has been helpful.  He said when he ran out of the Percocet just yesterday, he found himself again in quite severe pain.  PAST MEDICAL HISTORY:  Significant for myocardial infarction and lung cancer, nephrolithiasis.  He had an adenocarcinoma of the lung, prostate cancer, gout, hypercholesterolemia, and hypertension.  FAMILY HISTORY:    His mother and father are both deceased.  No history is provided.    SOCIAL HISTORY:    He stopped smoking in 1985.  He had a history of alcohol abuse but no longer drinks alcohol.  He is 172 cm. in height. He had a pulse of 84 and he weighs 171 pounds.    CURRENT MEDICATIONS:  Glucosamine, Voltaren, Nitrostat, Vitamin B12, Xanax, Allopurinol, Verapamil, Atorvastatin, Colcrys and Percocet.  He also had Flexeril which he said he had a very bad reaction to.  He did take prednisone but did tell me today that he had not taken any prednisone.  ALLERGIES:    TYLENOL #3.  REVIEW OF SYSTEMS:   Positive for eyeglasses, chest pain, swelling in the feet, leg pain with walking, arthritis, joint pain, back pain, leg weakness, prostate cancer, kidney stones, lung cancer and pneumonia.  He denies allergic, hematologic, endocrine, psychiatric, neurological, skin, constitutional, ENT and Mouth problems.     PHYSICAL EXAMINATION:  On exam, he has a markedly antalgic gait.  He favors his left lower extremity.  He has great difficulty with single toe raises on  the left side and    difficulty toe walking.  He can heel walk.  Reflexes are 2+ at the knees, not elicited at the left ankle, trace to not elicited at the right ankle.  He has 2+ reflexes in the upper extremities.  No clonus or Hoffmann's signs.  Romberg test is negative.  Muscle tone, bulk and coordination otherwise normal besides the limp.  PERRL.  Full EOM's.  Full visual fields.  Hearing is intact to finger rub bilaterally.  Uvula elevates in the midline.  Shoulder shrug is normal.  The tongue protrudes in the midline.    DIAGNOSTIC STUDIES:   MRI is reviewed.  What it shows is a very large disc herniation at L5-S1 eccentric to the left side.  He has obvious compromise of the left S1 nerve root.  The disc is degenerated there but it is degenerated at each and every level of the lumbar spine.  He has bone spurs at each and every level of the lumbar spine.  Alignment is otherwise normal.  Conus and Cauda are normal.  DIAGNOSES:      1. Displaced disc left L5-S1.  2. Left S1 radiculopathy.  RECOMMENDATIONS:   I spoke to Mr. Delprado at length.  Looking over his records, I gave him a Medrol Dosepak which I did not realized he had already taken but nevertheless will do that for a week.  I  will see how he does.  I think he should get an operation and told him so.  He is weak.  He has a great deal of pain and looks to be in absolute misery and is ineffective at his job all of which he admits to freely.  He is just scared which is a very reasonable proposition when someone speaks about doing an operation.  We will see how he does.  I did give him the information sheet with regards to surgery.  I told him to call me once the steroids are done to let me know how he is or is not doing.  I gave him a refill on the Percocet today #60 tablets, 1 p.o. q.5.h. of the 5/325 tablets.  He asked not to have the muscle relaxant so I did not give it to him.

## 2012-08-10 NOTE — Anesthesia Preprocedure Evaluation (Addendum)
Anesthesia Evaluation  Patient identified by MRN, date of birth, ID band Patient awake    Reviewed: Allergy & Precautions, H&P , NPO status , Patient's Chart, lab work & pertinent test results  History of Anesthesia Complications Negative for: history of anesthetic complications  Airway Mallampati: II TM Distance: >3 FB Neck ROM: Full    Dental  (+) Edentulous Upper, Edentulous Lower and Dental Advisory Given   Pulmonary pneumonia -, resolved,    Pulmonary exam normal       Cardiovascular hypertension, Pt. on medications + CAD and + Past MI     Neuro/Psych    GI/Hepatic negative GI ROS, Neg liver ROS,   Endo/Other  negative endocrine ROS  Renal/GU Renal diseaseHx of AKI     Musculoskeletal   Abdominal   Peds  Hematology   Anesthesia Other Findings   Reproductive/Obstetrics                        Anesthesia Physical Anesthesia Plan  ASA: III  Anesthesia Plan: General   Post-op Pain Management:    Induction: Intravenous  Airway Management Planned: Oral ETT  Additional Equipment:   Intra-op Plan:   Post-operative Plan: Extubation in OR  Informed Consent: I have reviewed the patients History and Physical, chart, labs and discussed the procedure including the risks, benefits and alternatives for the proposed anesthesia with the patient or authorized representative who has indicated his/her understanding and acceptance.   Dental advisory given  Plan Discussed with: CRNA, Anesthesiologist and Surgeon  Anesthesia Plan Comments:         Anesthesia Quick Evaluation

## 2012-08-11 NOTE — Discharge Summary (Signed)
Physician Discharge Summary  Patient ID: Daniel Vega MRN: [id number] DOB/AGE: [birth year] 71 y.o.  Admit date: 08/10/2012 Discharge date: 08/11/2012  Admission Diagnoses: L5-S1 herniated disc, lumbago, lumbar radiculopathy  Discharge Diagnoses: The same Principal Problem:  *HNP (herniated nucleus pulposus), lumbar   Discharged Condition: good  Hospital Course: Dr. Mikal Plane admitted the patient to [facility] on 08/10/2012. On that day performed at L5-S1 discectomy. The surgery went well.  Patient's postoperative course was unremarkable and by postop day #1 he was requesting discharge to home. The patient was given oral and written discharge instructions. All his questions were answered. He says he has pain medications at home and does not need new prescriptions.  Consults: None Significant Diagnostic Studies: None Treatments: L5-S1 discectomy using microdissection. Discharge Exam: Blood pressure 126/79, pulse 66, temperature 97.6 F (36.4 C), temperature source Oral, resp. rate 20, SpO2 92.00%. The patient is alert and pleasant. His wound is healing well without discharge. His strength is grossly normal his lower extremities. He looks well.  Disposition: Home  Discharge Orders    Future Appointments: Provider: Department: Dept Phone: Center:   01/24/2013 3:30 PM Krista Blue [facility] [phone] None   01/24/2013 4:00 PM Benjiman Core, MD [facility] [phone] None     Future Orders Please Complete By Expires   Diet - low sodium heart healthy      Increase activity slowly      Discharge instructions      Comments:   Call [phone] for a followup appointment.   No wound care      No dressing needed      Call MD for:  temperature >100.4      Call MD for:  persistant nausea and vomiting      Call MD for:  severe uncontrolled pain      Call MD for:  redness, tenderness, or signs of infection (pain, swelling, redness, odor or green/yellow  discharge around incision site)      Call MD for:  difficulty breathing, headache or visual disturbances      Call MD for:  hives      Call MD for:  persistant dizziness or light-headedness      Call MD for:  extreme fatigue          Medication List     As of 08/11/2012  9:20 AM    TAKE these medications         allopurinol 100 MG tablet   Commonly known as: ZYLOPRIM   Take 100 mg by mouth Daily.      ALPRAZolam 0.5 MG tablet   Commonly known as: XANAX   Take 0.5 mg by mouth 3 (three) times daily as needed. anxiety      atorvastatin 40 MG tablet   Commonly known as: LIPITOR   Take 40 mg by mouth daily.      colchicine 0.6 MG tablet   Take 0.6 mg by mouth daily.      diclofenac sodium 1 % Gel   Commonly known as: VOLTAREN   Apply 2 g topically every 6 (six) hours as needed. For pain      glucosamine-chondroitin 500-400 MG tablet   Take 2 tablets by mouth daily. Hold while in hospital      nitroGLYCERIN 0.4 MG SL tablet   Commonly known as: NITROSTAT   Place 0.4 mg under the tongue every 5 (five) minutes as needed. For chest pain      oxyCODONE-acetaminophen 5-325  MG per tablet   Commonly known as: PERCOCET/ROXICET   Take 1 tablet by mouth every 6 (six) hours as needed. For pain      verapamil 240 MG CR tablet   Commonly known as: CALAN-SR   Take 240 mg by mouth 2 (two) times daily.      VITAMIN B 12 PO   Take 1,000 mcg by mouth daily.         SignedTressie Stalker D 08/11/2012, 9:20 AM

## 2012-08-13 ENCOUNTER — Encounter (HOSPITAL_COMMUNITY): Payer: Self-pay | Admitting: Neurosurgery

## 2012-08-15 ENCOUNTER — Other Ambulatory Visit (HOSPITAL_COMMUNITY): Payer: Medicare Other

## 2012-09-20 DIAGNOSIS — H2510 Age-related nuclear cataract, unspecified eye: Secondary | ICD-10-CM | POA: Diagnosis not present

## 2012-09-20 DIAGNOSIS — H52229 Regular astigmatism, unspecified eye: Secondary | ICD-10-CM | POA: Diagnosis not present

## 2012-09-20 DIAGNOSIS — H5200 Hypermetropia, unspecified eye: Secondary | ICD-10-CM | POA: Diagnosis not present

## 2012-09-20 DIAGNOSIS — H524 Presbyopia: Secondary | ICD-10-CM | POA: Diagnosis not present

## 2012-12-04 DIAGNOSIS — M109 Gout, unspecified: Secondary | ICD-10-CM | POA: Diagnosis not present

## 2012-12-04 DIAGNOSIS — Z79899 Other long term (current) drug therapy: Secondary | ICD-10-CM | POA: Diagnosis not present

## 2012-12-04 DIAGNOSIS — M064 Inflammatory polyarthropathy: Secondary | ICD-10-CM | POA: Diagnosis not present

## 2012-12-04 DIAGNOSIS — M2550 Pain in unspecified joint: Secondary | ICD-10-CM | POA: Diagnosis not present

## 2013-01-11 DIAGNOSIS — Z79899 Other long term (current) drug therapy: Secondary | ICD-10-CM | POA: Diagnosis not present

## 2013-01-11 DIAGNOSIS — M109 Gout, unspecified: Secondary | ICD-10-CM | POA: Diagnosis not present

## 2013-01-11 DIAGNOSIS — M064 Inflammatory polyarthropathy: Secondary | ICD-10-CM | POA: Diagnosis not present

## 2013-01-11 DIAGNOSIS — M2550 Pain in unspecified joint: Secondary | ICD-10-CM | POA: Diagnosis not present

## 2013-01-24 ENCOUNTER — Ambulatory Visit (HOSPITAL_BASED_OUTPATIENT_CLINIC_OR_DEPARTMENT_OTHER): Payer: BLUE CROSS/BLUE SHIELD | Admitting: Oncology

## 2013-01-24 ENCOUNTER — Other Ambulatory Visit (HOSPITAL_BASED_OUTPATIENT_CLINIC_OR_DEPARTMENT_OTHER): Payer: Medicare Other | Admitting: Lab

## 2013-01-24 ENCOUNTER — Telehealth: Payer: Self-pay | Admitting: Oncology

## 2013-01-24 VITALS — BP 145/86 | HR 57 | Temp 97.2°F | Resp 20 | Ht 67.0 in | Wt 188.9 lb

## 2013-01-24 DIAGNOSIS — C61 Malignant neoplasm of prostate: Secondary | ICD-10-CM

## 2013-01-24 DIAGNOSIS — D751 Secondary polycythemia: Secondary | ICD-10-CM

## 2013-01-24 DIAGNOSIS — C3490 Malignant neoplasm of unspecified part of unspecified bronchus or lung: Secondary | ICD-10-CM

## 2013-01-24 LAB — COMPREHENSIVE METABOLIC PANEL (CC13)
ALT: 49 U/L (ref 0–55)
AST: 40 U/L — ABNORMAL HIGH (ref 5–34)
Albumin: 3.5 g/dL (ref 3.5–5.0)
Alkaline Phosphatase: 88 U/L (ref 40–150)
BUN: 20.3 mg/dL (ref 7.0–26.0)
Calcium: 8.6 mg/dL (ref 8.4–10.4)
Chloride: 108 mEq/L — ABNORMAL HIGH (ref 98–107)
Potassium: 3.9 mEq/L (ref 3.5–5.1)
Sodium: 139 mEq/L (ref 136–145)

## 2013-01-24 LAB — CBC WITH DIFFERENTIAL/PLATELET
BASO%: 1.2 % (ref 0.0–2.0)
Basophils Absolute: 0.1 10*3/uL (ref 0.0–0.1)
EOS%: 3.3 % (ref 0.0–7.0)
HGB: 13.5 g/dL (ref 13.0–17.1)
MCH: 33.2 pg (ref 27.2–33.4)
MCHC: 34.5 g/dL (ref 32.0–36.0)
MCV: 96.3 fL (ref 79.3–98.0)
MONO%: 12.2 % (ref 0.0–14.0)
RBC: 4.08 10*6/uL — ABNORMAL LOW (ref 4.20–5.82)
RDW: 14.3 % (ref 11.0–14.6)
lymph#: 1.7 10*3/uL (ref 0.9–3.3)

## 2013-01-24 NOTE — Progress Notes (Signed)
Hematology and Oncology Follow Up Visit  Daniel Vega [id number] 1941-04-28 72 y.o. 01/24/2013 3:55 PM   Principle Diagnosis: This is a 72 year old gentleman with an elevated ferritin level. Differential diagnosis including a history of alcohol use versus questionable hemochromatosis.  Current therapy: Observation and surveillance   Interim History:  [name] is a pleasant 71 year old gentleman who I saw for the first time back in November of 2012 for the evaluation for possible elevated iron levels.  When I saw him last time in 11/2011, his hemoglobin was normal.  Since his last visit, he was hospitalized in 01/2012 for CAP and has recovered since that time he also had a back operation and has been doing well since. He is asymptomatic and he is not reporting any symptoms of headaches or blurry vision. He has not reported any major changes in his performance status or activity level.   Medications: I have reviewed the patient's current medications. Current outpatient prescriptions:allopurinol (ZYLOPRIM) 100 MG tablet, Take 100 mg by mouth Daily., Disp: , Rfl: ;  ALPRAZolam (XANAX) 0.5 MG tablet, Take 0.5 mg by mouth 3 (three) times daily as needed. anxiety, Disp: , Rfl: ;  atorvastatin (LIPITOR) 40 MG tablet, Take 40 mg by mouth daily., Disp: , Rfl: ;  colchicine 0.6 MG tablet, Take 0.6 mg by mouth daily., Disp: , Rfl:  Cyanocobalamin (VITAMIN B 12 PO), Take 1,000 mcg by mouth daily.  , Disp: , Rfl: ;  diclofenac sodium (VOLTAREN) 1 % GEL, Apply 2 g topically every 6 (six) hours as needed. For pain, Disp: , Rfl: ;  glucosamine-chondroitin 500-400 MG tablet, Take 2 tablets by mouth daily. Hold while in hospital, Disp: , Rfl: ;  meloxicam (MOBIC) 15 MG tablet, Take 15 mg by mouth daily., Disp: , Rfl:  nitroGLYCERIN (NITROSTAT) 0.4 MG SL tablet, Place 0.4 mg under the tongue every 5 (five) minutes as needed. For chest pain, Disp: , Rfl: ;  oxyCODONE-acetaminophen (PERCOCET/ROXICET) 5-325 MG per  tablet, Take 1 tablet by mouth every 6 (six) hours as needed. For pain, Disp: , Rfl: ;  verapamil (CALAN-SR) 240 MG CR tablet, Take 240 mg by mouth 2 (two) times daily.  , Disp: , Rfl:   Allergies:  Allergies  Allergen Reactions  . Codeine     HA,     Past Medical History, Surgical history, Social history, and Family History were reviewed and updated.  Review of Systems: Constitutional:  Negative for fever, chills, night sweats, anorexia, weight loss, pain. Cardiovascular: no chest pain or dyspnea on exertion Respiratory: negative Neurological: no TIA or stroke symptoms Dermatological: negative ENT: negative Skin: Negative. Gastrointestinal: negative Genito-Urinary: negative Hematological and Lymphatic: negative Breast: negative Musculoskeletal: negative Remaining ROS negative. Physical Exam: Blood pressure 145/86, pulse 57, temperature 97.2 F (36.2 C), temperature source Oral, resp. rate 20, height 5\' 7"  (1.702 m), weight 188 lb 14.4 oz (85.684 kg), SpO2 98.00%. ECOG: 1 General appearance: alert Head: Normocephalic, without obvious abnormality, atraumatic Neck: no adenopathy, no carotid bruit, no JVD, supple, symmetrical, trachea midline and thyroid not enlarged, symmetric, no tenderness/mass/nodules Lymph nodes: Cervical, supraclavicular, and axillary nodes normal. Heart:regular rate and rhythm, S1, S2 normal, no murmur, click, rub or gallop Lung:chest clear, no wheezing, rales, normal symmetric air entry Abdomin: soft, non-tender, without masses or organomegaly EXT:no erythema, induration, or nodules   Lab Results: Lab Results  Component Value Date   WBC 4.8 01/24/2013   HGB 13.5 01/24/2013   HCT 39.3 01/24/2013   MCV 96.3 01/24/2013  PLT 135* 01/24/2013     Chemistry      Component Value Date/Time   NA 138 08/07/2012 1120   NA 138 07/24/2012 0833   K 3.7 08/07/2012 1120   K 3.6 07/24/2012 0833   CL 101 08/07/2012 1120   CL 103 07/24/2012 0833   CO2 29 08/07/2012  1120   CO2 23 07/24/2012 0833   BUN 15 08/07/2012 1120   BUN 20.0 07/24/2012 0833   CREATININE 0.99 08/07/2012 1120   CREATININE 1.0 07/24/2012 0833      Component Value Date/Time   CALCIUM 9.4 08/07/2012 1120   CALCIUM 8.9 07/24/2012 0833   ALKPHOS 49 08/07/2012 1120   ALKPHOS 56 07/24/2012 0833   AST 21 08/07/2012 1120   AST 19 07/24/2012 0833   ALT 27 08/07/2012 1120   ALT 23 07/24/2012 0833   BILITOT 0.6 08/07/2012 1120   BILITOT 1.30* 07/24/2012 0833       Impression and Plan:  72 year old gentleman with the following issues.  1. Elevated ferritin level. The differential diagnosis at this time include possible elevation due to acute phase reactant. Ferritin is known to have elevation in chronic inflammatory conditions. Also alcohol consumption can play a role as well. I doubt he has genetic hemochromatosis. His liver function tests are fairly normal. . At this time he does not really need any hematological intervention. I see no need for a phlebectomy. 2. Elevated mean corpuscular volume that has resolved at this time. His MCV is perfectly normal.  3. Followup. I will continue to monitor his blood counts. I am going to repeat his iron levels as well as a CBC in 9 months.   Eli Hose, MD 4/10/20143:55 PM

## 2013-01-24 NOTE — Telephone Encounter (Signed)
gve the pt his jan 2015 appt calendar

## 2013-04-05 DIAGNOSIS — M1990 Unspecified osteoarthritis, unspecified site: Secondary | ICD-10-CM | POA: Diagnosis not present

## 2013-04-05 DIAGNOSIS — M109 Gout, unspecified: Secondary | ICD-10-CM | POA: Diagnosis not present

## 2013-04-05 DIAGNOSIS — M064 Inflammatory polyarthropathy: Secondary | ICD-10-CM | POA: Diagnosis not present

## 2013-04-05 DIAGNOSIS — Z79899 Other long term (current) drug therapy: Secondary | ICD-10-CM | POA: Diagnosis not present

## 2013-04-26 DIAGNOSIS — R945 Abnormal results of liver function studies: Secondary | ICD-10-CM | POA: Diagnosis not present

## 2013-04-26 DIAGNOSIS — M109 Gout, unspecified: Secondary | ICD-10-CM | POA: Diagnosis not present

## 2013-05-03 DIAGNOSIS — C61 Malignant neoplasm of prostate: Secondary | ICD-10-CM | POA: Diagnosis not present

## 2013-05-15 DIAGNOSIS — F411 Generalized anxiety disorder: Secondary | ICD-10-CM | POA: Diagnosis not present

## 2013-05-15 DIAGNOSIS — R7309 Other abnormal glucose: Secondary | ICD-10-CM | POA: Diagnosis not present

## 2013-05-15 DIAGNOSIS — Z79899 Other long term (current) drug therapy: Secondary | ICD-10-CM | POA: Diagnosis not present

## 2013-05-15 DIAGNOSIS — I10 Essential (primary) hypertension: Secondary | ICD-10-CM | POA: Diagnosis not present

## 2013-05-15 DIAGNOSIS — M109 Gout, unspecified: Secondary | ICD-10-CM | POA: Diagnosis not present

## 2013-05-15 DIAGNOSIS — E785 Hyperlipidemia, unspecified: Secondary | ICD-10-CM | POA: Diagnosis not present

## 2013-07-20 DIAGNOSIS — Z23 Encounter for immunization: Secondary | ICD-10-CM | POA: Diagnosis not present

## 2013-08-21 DIAGNOSIS — L0201 Cutaneous abscess of face: Secondary | ICD-10-CM | POA: Diagnosis not present

## 2013-09-02 DIAGNOSIS — R319 Hematuria, unspecified: Secondary | ICD-10-CM | POA: Diagnosis not present

## 2013-09-02 DIAGNOSIS — M1990 Unspecified osteoarthritis, unspecified site: Secondary | ICD-10-CM | POA: Diagnosis not present

## 2013-09-06 DIAGNOSIS — R310 Gross hematuria: Secondary | ICD-10-CM | POA: Diagnosis not present

## 2013-09-09 DIAGNOSIS — C61 Malignant neoplasm of prostate: Secondary | ICD-10-CM | POA: Diagnosis not present

## 2013-09-09 DIAGNOSIS — R310 Gross hematuria: Secondary | ICD-10-CM | POA: Diagnosis not present

## 2013-09-20 DIAGNOSIS — H52229 Regular astigmatism, unspecified eye: Secondary | ICD-10-CM | POA: Diagnosis not present

## 2013-09-20 DIAGNOSIS — H5200 Hypermetropia, unspecified eye: Secondary | ICD-10-CM | POA: Diagnosis not present

## 2013-09-20 DIAGNOSIS — H2510 Age-related nuclear cataract, unspecified eye: Secondary | ICD-10-CM | POA: Diagnosis not present

## 2013-09-20 DIAGNOSIS — H524 Presbyopia: Secondary | ICD-10-CM | POA: Diagnosis not present

## 2013-09-26 ENCOUNTER — Encounter (HOSPITAL_COMMUNITY): Payer: Self-pay | Admitting: Emergency Medicine

## 2013-09-26 ENCOUNTER — Emergency Department (HOSPITAL_COMMUNITY)
Admission: EM | Admit: 2013-09-26 | Discharge: 2013-09-27 | Disposition: A | Discharge status: Home / Self Care | Payer: Medicare Other | Attending: Emergency Medicine | Admitting: Emergency Medicine

## 2013-09-26 DIAGNOSIS — E7800 Pure hypercholesterolemia, unspecified: Secondary | ICD-10-CM | POA: Insufficient documentation

## 2013-09-26 DIAGNOSIS — F10929 Alcohol use, unspecified with intoxication, unspecified: Secondary | ICD-10-CM

## 2013-09-26 DIAGNOSIS — G8929 Other chronic pain: Secondary | ICD-10-CM | POA: Insufficient documentation

## 2013-09-26 DIAGNOSIS — Z8546 Personal history of malignant neoplasm of prostate: Secondary | ICD-10-CM | POA: Insufficient documentation

## 2013-09-26 DIAGNOSIS — Z860100 Personal history of colon polyps, unspecified: Secondary | ICD-10-CM | POA: Insufficient documentation

## 2013-09-26 DIAGNOSIS — R319 Hematuria, unspecified: Secondary | ICD-10-CM | POA: Insufficient documentation

## 2013-09-26 DIAGNOSIS — Z23 Encounter for immunization: Secondary | ICD-10-CM | POA: Insufficient documentation

## 2013-09-26 DIAGNOSIS — W1809XA Striking against other object with subsequent fall, initial encounter: Secondary | ICD-10-CM | POA: Insufficient documentation

## 2013-09-26 DIAGNOSIS — F1010 Alcohol abuse, uncomplicated: Secondary | ICD-10-CM | POA: Insufficient documentation

## 2013-09-26 DIAGNOSIS — Z87891 Personal history of nicotine dependence: Secondary | ICD-10-CM | POA: Insufficient documentation

## 2013-09-26 DIAGNOSIS — Z95818 Presence of other cardiac implants and grafts: Secondary | ICD-10-CM | POA: Insufficient documentation

## 2013-09-26 DIAGNOSIS — T148XXA Other injury of unspecified body region, initial encounter: Secondary | ICD-10-CM | POA: Diagnosis not present

## 2013-09-26 DIAGNOSIS — I2510 Atherosclerotic heart disease of native coronary artery without angina pectoris: Secondary | ICD-10-CM | POA: Insufficient documentation

## 2013-09-26 DIAGNOSIS — S0100XA Unspecified open wound of scalp, initial encounter: Secondary | ICD-10-CM | POA: Insufficient documentation

## 2013-09-26 DIAGNOSIS — Z85118 Personal history of other malignant neoplasm of bronchus and lung: Secondary | ICD-10-CM | POA: Insufficient documentation

## 2013-09-26 DIAGNOSIS — S060X9A Concussion with loss of consciousness of unspecified duration, initial encounter: Secondary | ICD-10-CM | POA: Insufficient documentation

## 2013-09-26 DIAGNOSIS — Z87442 Personal history of urinary calculi: Secondary | ICD-10-CM | POA: Insufficient documentation

## 2013-09-26 DIAGNOSIS — I10 Essential (primary) hypertension: Secondary | ICD-10-CM | POA: Insufficient documentation

## 2013-09-26 DIAGNOSIS — S0990XA Unspecified injury of head, initial encounter: Secondary | ICD-10-CM

## 2013-09-26 DIAGNOSIS — Z79899 Other long term (current) drug therapy: Secondary | ICD-10-CM | POA: Insufficient documentation

## 2013-09-26 DIAGNOSIS — Y92009 Unspecified place in unspecified non-institutional (private) residence as the place of occurrence of the external cause: Secondary | ICD-10-CM | POA: Insufficient documentation

## 2013-09-26 DIAGNOSIS — R55 Syncope and collapse: Secondary | ICD-10-CM

## 2013-09-26 DIAGNOSIS — M25559 Pain in unspecified hip: Secondary | ICD-10-CM | POA: Insufficient documentation

## 2013-09-26 DIAGNOSIS — Y9389 Activity, other specified: Secondary | ICD-10-CM | POA: Insufficient documentation

## 2013-09-26 DIAGNOSIS — S0101XA Laceration without foreign body of scalp, initial encounter: Secondary | ICD-10-CM

## 2013-09-26 MED ORDER — TETANUS-DIPHTH-ACELL PERTUSSIS 5-2.5-18.5 LF-MCG/0.5 IM SUSP
0.5000 mL | Freq: Once | INTRAMUSCULAR | Status: AC
Start: 2013-09-27 — End: 2013-09-27
  Administered 2013-09-27: 0.5 mL via INTRAMUSCULAR
  Filled 2013-09-26: qty 0.5

## 2013-09-26 NOTE — ED Provider Notes (Signed)
CSN: [id number]     Arrival date & time 09/26/13  2347 History   First MD Initiated Contact with Patient 09/26/13 2348     Chief Complaint  Patient presents with  . Fall   (Consider location/radiation/quality/duration/timing/severity/associated sxs/prior Treatment) HPI Patient states he was drinking several whiskey drinks tonight at home and had a fall with loss of consciousness. EMS was called and  Pt was found outside. Patient had laceration to the scalp. He is placed in c-collar and long board. Patient moving all extremities but slurring her words. He is oriented x4. He doesn't remember the incident. He is unable to recall whether he fell and struck his head and passed out or passed out and then struck his head. He denies any chest pain or shortness of breath. He denies any neck pain or back pain. He has chronic right hip pain. He has no focal weakness or numbness. Past Medical History  Diagnosis Date  . Hypertension   . Hypercholesteremia   . Glucose intolerance (impaired glucose tolerance)   . Kidney stones, calcium oxalate   . CAD (coronary artery disease)   . 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 Adenomatous colon polyp     Dr. Madilyn Fireman  . Prostate cancer 2009    Dr. Hillis Range; tx w/ radium implants   Past Surgical History  Procedure Laterality Date  . Lobectomy  1999    left - Dr. Laneta Simmers  . Repair right testicular torsion  1962  . Cardiac catheterization  1997    Dr. [place]  . Prostate radium implants  2009  . Lung cancer surgery  2003  . Colonoscopy  ;11/12/99;04/29/03    12/27/09  . Lumbar laminectomy/decompression microdiscectomy  08/10/2012    Procedure: LUMBAR LAMINECTOMY/DECOMPRESSION MICRODISCECTOMY 1 LEVEL;  Surgeon: Carmela Hurt, MD;  Location: [facility];  Service: Neurosurgery;  Laterality: Left;  LEFT Lumbar five-sacral one microdiskectomy   Family History  Problem Relation Age of Onset  . Heart disease Mother   . Hypertension Mother   . Heart  disease Father   . Hypertension Father   . Stroke Father   . Other Brother     spinal meningitis  . Other Brother     MVA  . Cancer Sister     stomach   History  Substance Use Topics  . Smoking status: Former Smoker -- 2.00 packs/day for 35 years    Types: Cigarettes    Quit date: 10/17/1982  . Smokeless tobacco: Former Neurosurgeon    Types: Chew    Quit date: 03/15/2012  . Alcohol Use: 7.0 oz/week    14 drink(s) per week     Comment: 2 drinks a day before supper but has not had in 1 month    Review of Systems  Constitutional: Negative for fever and chills.  Respiratory: Negative for shortness of breath.   Cardiovascular: Negative for chest pain.  Gastrointestinal: Negative for nausea, vomiting and abdominal pain.  Genitourinary: Positive for hematuria. Negative for dysuria and frequency.  Musculoskeletal: Positive for arthralgias. Negative for back pain, neck pain and neck stiffness.  Skin: Positive for wound. Negative for rash.  Neurological: Positive for syncope. Negative for dizziness, weakness, light-headedness, numbness and headaches.  All other systems reviewed and are negative.    Allergies  Codeine  Home Medications   Current Outpatient Rx  Name  Route  Sig  Dispense  Refill  . allopurinol (ZYLOPRIM) 100 MG tablet   Oral   Take  100 mg by mouth Daily.         Marland Kitchen ALPRAZolam (XANAX) 0.5 MG tablet   Oral   Take 0.5 mg by mouth 3 (three) times daily as needed. anxiety         . atorvastatin (LIPITOR) 40 MG tablet   Oral   Take 40 mg by mouth daily.         . colchicine 0.6 MG tablet   Oral   Take 0.6 mg by mouth daily.         . Cyanocobalamin (VITAMIN B 12 PO)   Oral   Take 1,000 mcg by mouth daily.           . diclofenac sodium (VOLTAREN) 1 % GEL   Topical   Apply 2 g topically every 6 (six) hours as needed. For pain         . glucosamine-chondroitin 500-400 MG tablet   Oral   Take 2 tablets by mouth daily. Hold while in hospital          . meloxicam (MOBIC) 15 MG tablet   Oral   Take 15 mg by mouth daily.         . nitroGLYCERIN (NITROSTAT) 0.4 MG SL tablet   Sublingual   Place 0.4 mg under the tongue every 5 (five) minutes as needed. For chest pain         . oxyCODONE-acetaminophen (PERCOCET/ROXICET) 5-325 MG per tablet   Oral   Take 1 tablet by mouth every 6 (six) hours as needed. For pain         . verapamil (CALAN-SR) 240 MG CR tablet   Oral   Take 240 mg by mouth 2 (two) times daily.            There were no vitals taken for this visit. Physical Exam  Nursing note and vitals reviewed. Constitutional: He is oriented to person, place, and time. He appears well-developed and well-nourished. No distress.  HENT:  Head: Normocephalic.  Mouth/Throat: Oropharynx is clear and moist.  Eyes: EOM are normal. Pupils are equal, round, and reactive to light.  Neck:  Cervical collar in place.  Cardiovascular: Normal rate and regular rhythm.   Pulmonary/Chest: Effort normal and breath sounds normal. No respiratory distress. He has no wheezes. He has no rales. He exhibits no tenderness.  Abdominal: Soft. Bowel sounds are normal. He exhibits no distension and no mass. There is no tenderness. There is no rebound and no guarding.  Musculoskeletal: Normal range of motion. He exhibits no edema and no tenderness.  Mild pain with range of motion of his right hip. Distal pulses intact. No thoracic or lumbar spinal tenderness.  Neurological: He is alert and oriented to person, place, and time.  Slurring her words. 5/5 motor in all extremities. Sensation intact.  Skin: Skin is warm and dry. No rash noted. No erythema.  Psychiatric: He has a normal mood and affect. His behavior is normal.    ED Course  LACERATION REPAIR Date/Time: 09/27/2013 3:27 AM Performed by: Loren Racer Authorized by: Ranae Palms, Ericka Marcellus Body area: head/neck Location details: scalp Laceration length: 3 cm Foreign bodies: no foreign  bodies Amount of cleaning: standard Skin closure: staples Number of sutures: 3 Dressing: antibiotic ointment Patient tolerance: Patient tolerated the procedure well with no immediate complications. Comments: Hemostasis achieved.   (including critical care time) Labs Review Labs Reviewed  CBC WITH DIFFERENTIAL  COMPREHENSIVE METABOLIC PANEL  URINALYSIS, ROUTINE W REFLEX MICROSCOPIC  PROTIME-INR  ETHANOL  URINE RAPID DRUG SCREEN (HOSP PERFORMED)  APTT   Imaging Review No results found.  EKG Interpretation   None       MDM  Patient ambulating in the emergency department without difficulty. His speech is now clear. He has a normal neurologic exam. We'll discharge home with family with head injury precautions. Patient is to have his staples removed in one week.    Loren Racer, MD 09/27/13 [phone]

## 2013-09-26 NOTE — ED Notes (Signed)
Per EMS, pt. Was drinking with some friends and "next thing I know I was on the floor". Pt. Reports 1/5 of [place]. Pt slurring words on arrival. Alert and oriented x4. Does not remember incident. Laceration to head.

## 2013-09-27 ENCOUNTER — Emergency Department (HOSPITAL_COMMUNITY): Payer: Medicare Other

## 2013-09-27 ENCOUNTER — Encounter (HOSPITAL_COMMUNITY): Payer: Self-pay | Admitting: Radiology

## 2013-09-27 DIAGNOSIS — S79919A Unspecified injury of unspecified hip, initial encounter: Secondary | ICD-10-CM | POA: Diagnosis not present

## 2013-09-27 DIAGNOSIS — S0993XA Unspecified injury of face, initial encounter: Secondary | ICD-10-CM | POA: Diagnosis not present

## 2013-09-27 DIAGNOSIS — S0190XA Unspecified open wound of unspecified part of head, initial encounter: Secondary | ICD-10-CM | POA: Diagnosis not present

## 2013-09-27 LAB — APTT: aPTT: 27 seconds (ref 24–37)

## 2013-09-27 LAB — URINALYSIS, ROUTINE W REFLEX MICROSCOPIC
Nitrite: NEGATIVE
Specific Gravity, Urine: 1.006 (ref 1.005–1.030)
Urobilinogen, UA: 0.2 mg/dL (ref 0.0–1.0)

## 2013-09-27 LAB — COMPREHENSIVE METABOLIC PANEL
ALT: 48 U/L (ref 0–53)
Alkaline Phosphatase: 76 U/L (ref 39–117)
BUN: 16 mg/dL (ref 6–23)
Chloride: 110 mEq/L (ref 96–112)
Creatinine, Ser: 1.02 mg/dL (ref 0.50–1.35)
GFR calc Af Amer: 83 mL/min — ABNORMAL LOW (ref >90)
GFR calc non Af Amer: 72 mL/min — ABNORMAL LOW (ref >90)
Potassium: 3.4 mEq/L — ABNORMAL LOW (ref 3.5–5.1)
Total Bilirubin: 0.4 mg/dL (ref 0.3–1.2)
Total Protein: 6.4 g/dL (ref 6.0–8.3)

## 2013-09-27 LAB — CBC WITH DIFFERENTIAL/PLATELET
Basophils Absolute: 0 10*3/uL (ref 0.0–0.1)
Basophils Relative: 0 % (ref 0–1)
Eosinophils Absolute: 0.2 10*3/uL (ref 0.0–0.7)
Hemoglobin: 14.1 g/dL (ref 13.0–17.0)
MCH: 34.1 pg — ABNORMAL HIGH (ref 26.0–34.0)
MCHC: 35.6 g/dL (ref 30.0–36.0)
Monocytes Absolute: 0.3 10*3/uL (ref 0.1–1.0)
Monocytes Relative: 7 % (ref 3–12)
Neutro Abs: 2.5 10*3/uL (ref 1.7–7.7)
Neutrophils Relative %: 55 % (ref 43–77)
RDW: 13.2 % (ref 11.5–15.5)

## 2013-09-27 LAB — URINE MICROSCOPIC-ADD ON

## 2013-09-27 LAB — RAPID URINE DRUG SCREEN, HOSP PERFORMED: Barbiturates: NOT DETECTED

## 2013-09-27 LAB — PROTIME-INR
INR: 0.98 (ref 0.00–1.49)
Prothrombin Time: 12.8 seconds (ref 11.6–15.2)

## 2013-09-27 NOTE — ED Notes (Signed)
Pt transported back from radiology  

## 2013-09-27 NOTE — ED Notes (Signed)
Pt returned from radiology department

## 2013-09-27 NOTE — ED Notes (Signed)
Ambulated pt more then 40 feet. Pt tolerated well, no complaints of being dizzy. Pt was a little unsteady but walked well with one assisting.

## 2013-10-02 DIAGNOSIS — Z8546 Personal history of malignant neoplasm of prostate: Secondary | ICD-10-CM | POA: Diagnosis not present

## 2013-10-02 DIAGNOSIS — R310 Gross hematuria: Secondary | ICD-10-CM | POA: Diagnosis not present

## 2013-10-04 DIAGNOSIS — M1990 Unspecified osteoarthritis, unspecified site: Secondary | ICD-10-CM | POA: Diagnosis not present

## 2013-10-04 DIAGNOSIS — Z79899 Other long term (current) drug therapy: Secondary | ICD-10-CM | POA: Diagnosis not present

## 2013-10-04 DIAGNOSIS — M064 Inflammatory polyarthropathy: Secondary | ICD-10-CM | POA: Diagnosis not present

## 2013-10-04 DIAGNOSIS — M109 Gout, unspecified: Secondary | ICD-10-CM | POA: Diagnosis not present

## 2013-10-23 DIAGNOSIS — I10 Essential (primary) hypertension: Secondary | ICD-10-CM | POA: Diagnosis not present

## 2013-10-23 DIAGNOSIS — M109 Gout, unspecified: Secondary | ICD-10-CM | POA: Diagnosis not present

## 2013-10-23 DIAGNOSIS — E785 Hyperlipidemia, unspecified: Secondary | ICD-10-CM | POA: Diagnosis not present

## 2013-10-23 DIAGNOSIS — M542 Cervicalgia: Secondary | ICD-10-CM | POA: Diagnosis not present

## 2013-10-24 ENCOUNTER — Telehealth: Payer: Self-pay | Admitting: Oncology

## 2013-10-24 NOTE — Telephone Encounter (Signed)
s.w. pt and advised on 1.9.15 appt time change...pt ok and awaer

## 2013-10-25 ENCOUNTER — Ambulatory Visit (HOSPITAL_BASED_OUTPATIENT_CLINIC_OR_DEPARTMENT_OTHER): Payer: Medicare Other | Admitting: Oncology

## 2013-10-25 ENCOUNTER — Other Ambulatory Visit: Payer: Medicare Other

## 2013-10-25 ENCOUNTER — Other Ambulatory Visit (HOSPITAL_BASED_OUTPATIENT_CLINIC_OR_DEPARTMENT_OTHER): Payer: Medicare Other

## 2013-10-25 ENCOUNTER — Encounter: Payer: Self-pay | Admitting: Oncology

## 2013-10-25 DIAGNOSIS — D751 Secondary polycythemia: Secondary | ICD-10-CM

## 2013-10-25 DIAGNOSIS — R7989 Other specified abnormal findings of blood chemistry: Secondary | ICD-10-CM

## 2013-10-25 DIAGNOSIS — C61 Malignant neoplasm of prostate: Secondary | ICD-10-CM

## 2013-10-25 LAB — COMPREHENSIVE METABOLIC PANEL (CC13)
ALBUMIN: 3.8 g/dL (ref 3.5–5.0)
ALT: 21 U/L (ref 0–55)
ANION GAP: 8 meq/L (ref 3–11)
AST: 20 U/L (ref 5–34)
Alkaline Phosphatase: 75 U/L (ref 40–150)
BILIRUBIN TOTAL: 0.96 mg/dL (ref 0.20–1.20)
BUN: 19.7 mg/dL (ref 7.0–26.0)
CO2: 23 meq/L (ref 22–29)
Calcium: 9.3 mg/dL (ref 8.4–10.4)
Chloride: 109 mEq/L (ref 98–109)
Creatinine: 1 mg/dL (ref 0.7–1.3)
GLUCOSE: 112 mg/dL (ref 70–140)
Potassium: 4.5 mEq/L (ref 3.5–5.1)
SODIUM: 140 meq/L (ref 136–145)
TOTAL PROTEIN: 6.9 g/dL (ref 6.4–8.3)

## 2013-10-25 LAB — CBC WITH DIFFERENTIAL/PLATELET
BASO%: 0.9 % (ref 0.0–2.0)
BASOS ABS: 0 10*3/uL (ref 0.0–0.1)
EOS ABS: 0.2 10*3/uL (ref 0.0–0.5)
EOS%: 4.1 % (ref 0.0–7.0)
HEMATOCRIT: 44.5 % (ref 38.4–49.9)
HEMOGLOBIN: 15.2 g/dL (ref 13.0–17.1)
LYMPH%: 39.6 % (ref 14.0–49.0)
MCH: 33.5 pg — ABNORMAL HIGH (ref 27.2–33.4)
MCHC: 34.1 g/dL (ref 32.0–36.0)
MCV: 98.4 fL — AB (ref 79.3–98.0)
MONO#: 0.5 10*3/uL (ref 0.1–0.9)
MONO%: 9.6 % (ref 0.0–14.0)
NEUT%: 45.8 % (ref 39.0–75.0)
NEUTROS ABS: 2.3 10*3/uL (ref 1.5–6.5)
PLATELETS: 139 10*3/uL — AB (ref 140–400)
RBC: 4.52 10*6/uL (ref 4.20–5.82)
RDW: 14 % (ref 11.0–14.6)
WBC: 5.1 10*3/uL (ref 4.0–10.3)
lymph#: 2 10*3/uL (ref 0.9–3.3)

## 2013-10-25 LAB — IRON AND TIBC CHCC
%SAT: 96 % — ABNORMAL HIGH (ref 20–55)
Iron: 250 ug/dL — ABNORMAL HIGH (ref 42–163)
TIBC: 261 ug/dL (ref 202–409)
UIBC: 12 ug/dL — ABNORMAL LOW (ref 117–376)

## 2013-10-25 LAB — FERRITIN CHCC: Ferritin: 645 ng/ml — ABNORMAL HIGH (ref 22–316)

## 2013-10-25 NOTE — Progress Notes (Signed)
Hematology and Oncology Follow Up Visit  Daniel Vega [id number] 1940/12/25 73 y.o. 10/25/2013 10:26 AM   Principle Diagnosis: This is a 73 year old gentleman with an elevated ferritin level. Differential diagnosis including a history of alcohol use versus questionable hemochromatosis.  Current therapy: Observation and surveillance   Interim History:  Daniel Vega is a pleasant 73 year old gentleman who I saw for the first time back in November of 2012 for the evaluation for possible elevated iron levels.  When I saw him last time in 11/2011, his hemoglobin was normal.  Since his last visit, he has been doing well since. He is asymptomatic and he is not reporting any symptoms of headaches or blurry vision. He has not reported any major changes in his performance status or activity level. He does report occasional hematuria and follows up with Dr. Luberta Robertson for the management of his prostate cancer and kidney stones.   Medications: I have reviewed the patient's current medications.  Current Outpatient Prescriptions  Medication Sig Dispense Refill  . allopurinol (ZYLOPRIM) 100 MG tablet Take 100 mg by mouth Daily.      Marland Kitchen ALPRAZolam (XANAX) 0.5 MG tablet Take 0.5 mg by mouth 3 (three) times daily as needed. anxiety      . aspirin 81 MG tablet Take 81 mg by mouth daily.      Marland Kitchen atorvastatin (LIPITOR) 40 MG tablet Take 40 mg by mouth daily.      . colchicine 0.6 MG tablet Take 0.6 mg by mouth daily.      . Cyanocobalamin (VITAMIN B 12 PO) Take 1,000 mcg by mouth daily.        Marland Kitchen glucosamine-chondroitin 500-400 MG tablet Take 2 tablets by mouth daily. Hold while in hospital      . meloxicam (MOBIC) 15 MG tablet Take 15 mg by mouth daily.      . nitroGLYCERIN (NITROSTAT) 0.4 MG SL tablet Place 0.4 mg under the tongue every 5 (five) minutes as needed. For chest pain      . verapamil (CALAN-SR) 240 MG CR tablet Take 240 mg by mouth 2 (two) times daily.         No current facility-administered  medications for this visit.    Allergies:  Allergies  Allergen Reactions  . Codeine     HA,     Past Medical History, Surgical history, Social history, and Family History were reviewed and updated.  Review of Systems: Constitutional:  Negative for fever, chills, night sweats, anorexia, weight loss, pain.  Remaining ROS negative. Physical Exam: Blood pressure 153/81, pulse 59, temperature 97.2 F (36.2 C), temperature source Oral, resp. rate 18, height 5\' 7"  (1.702 m), weight 197 lb 1.6 oz (89.404 kg). ECOG: 1 General appearance: alert Head: Normocephalic, without obvious abnormality, atraumatic Neck: no adenopathy, no carotid bruit, no JVD, supple, symmetrical, trachea midline and thyroid not enlarged, symmetric, no tenderness/mass/nodules Lymph nodes: Cervical, supraclavicular, and axillary nodes normal. Heart:regular rate and rhythm, S1, S2 normal, no murmur, click, rub or gallop Lung:chest clear, no wheezing, rales, normal symmetric air entry Abdomin: soft, non-tender, without masses or organomegaly EXT:no erythema, induration, or nodules   Lab Results: Lab Results  Component Value Date   WBC 5.1 10/25/2013   HGB 15.2 10/25/2013   HCT 44.5 10/25/2013   MCV 98.4* 10/25/2013   PLT 139* 10/25/2013     Chemistry      Component Value Date/Time   NA 143 09/27/2013 0047   NA 139 01/24/2013 1518   K 3.4* 09/27/2013  0047   K 3.9 01/24/2013 1518   CL 110 09/27/2013 0047   CL 108* 01/24/2013 1518   CO2 22 09/27/2013 0047   CO2 24 01/24/2013 1518   BUN 16 09/27/2013 0047   BUN 20.3 01/24/2013 1518   CREATININE 1.02 09/27/2013 0047   CREATININE 1.2 01/24/2013 1518      Component Value Date/Time   CALCIUM 8.0* 09/27/2013 0047   CALCIUM 8.6 01/24/2013 1518   ALKPHOS 76 09/27/2013 0047   ALKPHOS 88 01/24/2013 1518   AST 39* 09/27/2013 0047   AST 40* 01/24/2013 1518   ALT 48 09/27/2013 0047   ALT 49 01/24/2013 1518   BILITOT 0.4 09/27/2013 0047   BILITOT 1.07 01/24/2013 1518        Impression and Plan:  73 year old gentleman with the following issues.  1. Elevated ferritin level. The differential diagnosis at this time include possible elevation due to acute phase reactant. Ferritin is known to have elevation in chronic inflammatory conditions. Also alcohol consumption can play a role as well. I doubt he has genetic hemochromatosis. His liver function tests are fairly normal. . At this time he does not really need any hematological intervention. I see no need for a phlebectomy. 2. Prostate cancer: He follows up with Dr. Luberta Robertson for the evaluation and management. But I will be happy to see him in the future for this issue as needed to 3. Followup. As needed.   [facility], MD 1/9/201510:26 AM

## 2014-01-01 ENCOUNTER — Encounter: Payer: Self-pay | Admitting: Cardiology

## 2014-01-03 DIAGNOSIS — R5381 Other malaise: Secondary | ICD-10-CM | POA: Diagnosis not present

## 2014-01-03 DIAGNOSIS — M109 Gout, unspecified: Secondary | ICD-10-CM | POA: Diagnosis not present

## 2014-01-03 DIAGNOSIS — M1990 Unspecified osteoarthritis, unspecified site: Secondary | ICD-10-CM | POA: Diagnosis not present

## 2014-01-03 DIAGNOSIS — M19079 Primary osteoarthritis, unspecified ankle and foot: Secondary | ICD-10-CM | POA: Diagnosis not present

## 2014-01-03 DIAGNOSIS — M79609 Pain in unspecified limb: Secondary | ICD-10-CM | POA: Diagnosis not present

## 2014-01-03 DIAGNOSIS — R5383 Other fatigue: Secondary | ICD-10-CM | POA: Diagnosis not present

## 2014-01-03 DIAGNOSIS — Z79899 Other long term (current) drug therapy: Secondary | ICD-10-CM | POA: Diagnosis not present

## 2014-01-03 DIAGNOSIS — M064 Inflammatory polyarthropathy: Secondary | ICD-10-CM | POA: Diagnosis not present

## 2014-01-31 DIAGNOSIS — M064 Inflammatory polyarthropathy: Secondary | ICD-10-CM | POA: Diagnosis not present

## 2014-03-07 DIAGNOSIS — Z79899 Other long term (current) drug therapy: Secondary | ICD-10-CM | POA: Diagnosis not present

## 2014-03-07 DIAGNOSIS — M064 Inflammatory polyarthropathy: Secondary | ICD-10-CM | POA: Diagnosis not present

## 2014-03-07 DIAGNOSIS — M109 Gout, unspecified: Secondary | ICD-10-CM | POA: Diagnosis not present

## 2014-03-07 DIAGNOSIS — M1990 Unspecified osteoarthritis, unspecified site: Secondary | ICD-10-CM | POA: Diagnosis not present

## 2014-05-15 ENCOUNTER — Ambulatory Visit (HOSPITAL_COMMUNITY)
Admission: RE | Admit: 2014-05-15 | Discharge: 2014-05-15 | Disposition: A | Discharge status: Home / Self Care | Payer: Medicare Other | Source: Ambulatory Visit | Attending: Family Medicine | Admitting: Family Medicine

## 2014-05-15 ENCOUNTER — Other Ambulatory Visit (HOSPITAL_COMMUNITY): Payer: Self-pay | Admitting: Family Medicine

## 2014-05-15 DIAGNOSIS — M25569 Pain in unspecified knee: Secondary | ICD-10-CM | POA: Insufficient documentation

## 2014-05-15 DIAGNOSIS — M7989 Other specified soft tissue disorders: Secondary | ICD-10-CM | POA: Diagnosis not present

## 2014-05-15 DIAGNOSIS — M79609 Pain in unspecified limb: Secondary | ICD-10-CM | POA: Diagnosis not present

## 2014-05-15 DIAGNOSIS — M25562 Pain in left knee: Secondary | ICD-10-CM

## 2014-05-15 DIAGNOSIS — I8000 Phlebitis and thrombophlebitis of superficial vessels of unspecified lower extremity: Secondary | ICD-10-CM | POA: Diagnosis not present

## 2014-05-15 DIAGNOSIS — I809 Phlebitis and thrombophlebitis of unspecified site: Secondary | ICD-10-CM | POA: Diagnosis not present

## 2014-05-15 NOTE — Progress Notes (Signed)
VASCULAR LAB PRELIMINARY  PRELIMINARY  PRELIMINARY  PRELIMINARY  Left lower extremity venous duplex completed.    Preliminary report:  Left:  Superficial thrombosis noted in the greater saphenous vein and varicosities.  No evidence of deep vein thrombosis.     Nya Monds, RVT 05/15/2014, 9:57 AM

## 2014-05-22 ENCOUNTER — Other Ambulatory Visit: Payer: Medicare Other

## 2014-05-22 ENCOUNTER — Other Ambulatory Visit: Payer: Self-pay | Admitting: Family Medicine

## 2014-05-22 ENCOUNTER — Ambulatory Visit
Admission: RE | Admit: 2014-05-22 | Discharge: 2014-05-22 | Disposition: A | Discharge status: Home / Self Care | Payer: Medicare Other | Source: Ambulatory Visit | Attending: Family Medicine | Admitting: Family Medicine

## 2014-05-22 DIAGNOSIS — R9389 Abnormal findings on diagnostic imaging of other specified body structures: Secondary | ICD-10-CM | POA: Diagnosis not present

## 2014-05-22 DIAGNOSIS — Z85118 Personal history of other malignant neoplasm of bronchus and lung: Secondary | ICD-10-CM

## 2014-05-30 DIAGNOSIS — Z8546 Personal history of malignant neoplasm of prostate: Secondary | ICD-10-CM | POA: Diagnosis not present

## 2014-05-30 DIAGNOSIS — R972 Elevated prostate specific antigen [PSA]: Secondary | ICD-10-CM | POA: Diagnosis not present

## 2014-06-13 DIAGNOSIS — M1990 Unspecified osteoarthritis, unspecified site: Secondary | ICD-10-CM | POA: Diagnosis not present

## 2014-06-13 DIAGNOSIS — Z79899 Other long term (current) drug therapy: Secondary | ICD-10-CM | POA: Diagnosis not present

## 2014-06-13 DIAGNOSIS — M064 Inflammatory polyarthropathy: Secondary | ICD-10-CM | POA: Diagnosis not present

## 2014-06-13 DIAGNOSIS — M109 Gout, unspecified: Secondary | ICD-10-CM | POA: Diagnosis not present

## 2014-06-19 DIAGNOSIS — E7800 Pure hypercholesterolemia, unspecified: Secondary | ICD-10-CM | POA: Diagnosis not present

## 2014-06-19 DIAGNOSIS — Z23 Encounter for immunization: Secondary | ICD-10-CM | POA: Diagnosis not present

## 2014-06-19 DIAGNOSIS — I10 Essential (primary) hypertension: Secondary | ICD-10-CM | POA: Diagnosis not present

## 2014-07-05 ENCOUNTER — Emergency Department (HOSPITAL_COMMUNITY)
Admission: EM | Admit: 2014-07-05 | Discharge: 2014-07-05 | Disposition: A | Discharge status: Home / Self Care | Payer: Medicare Other | Attending: Emergency Medicine | Admitting: Emergency Medicine

## 2014-07-05 ENCOUNTER — Encounter (HOSPITAL_COMMUNITY): Payer: Self-pay | Admitting: Emergency Medicine

## 2014-07-05 ENCOUNTER — Emergency Department (HOSPITAL_COMMUNITY): Payer: Medicare Other

## 2014-07-05 DIAGNOSIS — Z860100 Personal history of colon polyps, unspecified: Secondary | ICD-10-CM | POA: Insufficient documentation

## 2014-07-05 DIAGNOSIS — Z9889 Other specified postprocedural states: Secondary | ICD-10-CM | POA: Insufficient documentation

## 2014-07-05 DIAGNOSIS — IMO0002 Reserved for concepts with insufficient information to code with codable children: Secondary | ICD-10-CM | POA: Insufficient documentation

## 2014-07-05 DIAGNOSIS — Z87891 Personal history of nicotine dependence: Secondary | ICD-10-CM | POA: Insufficient documentation

## 2014-07-05 DIAGNOSIS — J189 Pneumonia, unspecified organism: Secondary | ICD-10-CM | POA: Diagnosis not present

## 2014-07-05 DIAGNOSIS — E7800 Pure hypercholesterolemia, unspecified: Secondary | ICD-10-CM | POA: Diagnosis not present

## 2014-07-05 DIAGNOSIS — J13 Pneumonia due to Streptococcus pneumoniae: Secondary | ICD-10-CM | POA: Insufficient documentation

## 2014-07-05 DIAGNOSIS — Z7982 Long term (current) use of aspirin: Secondary | ICD-10-CM | POA: Insufficient documentation

## 2014-07-05 DIAGNOSIS — D72829 Elevated white blood cell count, unspecified: Secondary | ICD-10-CM | POA: Diagnosis not present

## 2014-07-05 DIAGNOSIS — R0602 Shortness of breath: Secondary | ICD-10-CM | POA: Diagnosis not present

## 2014-07-05 DIAGNOSIS — R079 Chest pain, unspecified: Secondary | ICD-10-CM | POA: Insufficient documentation

## 2014-07-05 DIAGNOSIS — Z791 Long term (current) use of non-steroidal anti-inflammatories (NSAID): Secondary | ICD-10-CM | POA: Insufficient documentation

## 2014-07-05 DIAGNOSIS — R918 Other nonspecific abnormal finding of lung field: Secondary | ICD-10-CM | POA: Diagnosis not present

## 2014-07-05 DIAGNOSIS — I10 Essential (primary) hypertension: Secondary | ICD-10-CM | POA: Insufficient documentation

## 2014-07-05 DIAGNOSIS — I209 Angina pectoris, unspecified: Secondary | ICD-10-CM | POA: Insufficient documentation

## 2014-07-05 DIAGNOSIS — Z79899 Other long term (current) drug therapy: Secondary | ICD-10-CM | POA: Diagnosis not present

## 2014-07-05 DIAGNOSIS — I2510 Atherosclerotic heart disease of native coronary artery without angina pectoris: Secondary | ICD-10-CM | POA: Insufficient documentation

## 2014-07-05 DIAGNOSIS — Z85118 Personal history of other malignant neoplasm of bronchus and lung: Secondary | ICD-10-CM | POA: Insufficient documentation

## 2014-07-05 DIAGNOSIS — Z87442 Personal history of urinary calculi: Secondary | ICD-10-CM | POA: Insufficient documentation

## 2014-07-05 DIAGNOSIS — Z8546 Personal history of malignant neoplasm of prostate: Secondary | ICD-10-CM | POA: Insufficient documentation

## 2014-07-05 DIAGNOSIS — R1013 Epigastric pain: Secondary | ICD-10-CM | POA: Diagnosis not present

## 2014-07-05 DIAGNOSIS — J181 Lobar pneumonia, unspecified organism: Secondary | ICD-10-CM

## 2014-07-05 LAB — COMPREHENSIVE METABOLIC PANEL
ALBUMIN: 3.7 g/dL (ref 3.5–5.2)
ALT: 22 U/L (ref 0–53)
ANION GAP: 14 (ref 5–15)
AST: 19 U/L (ref 0–37)
Alkaline Phosphatase: 44 U/L (ref 39–117)
BUN: 18 mg/dL (ref 6–23)
CALCIUM: 9.1 mg/dL (ref 8.4–10.5)
CO2: 24 mEq/L (ref 19–32)
CREATININE: 1.08 mg/dL (ref 0.50–1.35)
Chloride: 101 mEq/L (ref 96–112)
GFR calc Af Amer: 77 mL/min — ABNORMAL LOW (ref >90)
GFR calc non Af Amer: 67 mL/min — ABNORMAL LOW (ref >90)
Glucose, Bld: 134 mg/dL — ABNORMAL HIGH (ref 70–99)
Potassium: 4.6 mEq/L (ref 3.7–5.3)
Sodium: 139 mEq/L (ref 137–147)
TOTAL PROTEIN: 7.1 g/dL (ref 6.0–8.3)
Total Bilirubin: 2 mg/dL — ABNORMAL HIGH (ref 0.3–1.2)

## 2014-07-05 LAB — PRO B NATRIURETIC PEPTIDE: Pro B Natriuretic peptide (BNP): 116.1 pg/mL (ref 0–125)

## 2014-07-05 LAB — CBC WITH DIFFERENTIAL/PLATELET
BASOS PCT: 0 % (ref 0–1)
Basophils Absolute: 0 10*3/uL (ref 0.0–0.1)
EOS PCT: 0 % (ref 0–5)
Eosinophils Absolute: 0 10*3/uL (ref 0.0–0.7)
HEMATOCRIT: 40.3 % (ref 39.0–52.0)
HEMOGLOBIN: 13.7 g/dL (ref 13.0–17.0)
Lymphocytes Relative: 16 % (ref 12–46)
Lymphs Abs: 1.8 10*3/uL (ref 0.7–4.0)
MCH: 36.8 pg — ABNORMAL HIGH (ref 26.0–34.0)
MCHC: 34 g/dL (ref 30.0–36.0)
MCV: 108.3 fL — ABNORMAL HIGH (ref 78.0–100.0)
MONO ABS: 1.3 10*3/uL — AB (ref 0.1–1.0)
MONOS PCT: 11 % (ref 3–12)
Neutro Abs: 8.3 10*3/uL — ABNORMAL HIGH (ref 1.7–7.7)
Neutrophils Relative %: 73 % (ref 43–77)
Platelets: 123 10*3/uL — ABNORMAL LOW (ref 150–400)
RBC: 3.72 MIL/uL — ABNORMAL LOW (ref 4.22–5.81)
RDW: 14.5 % (ref 11.5–15.5)
WBC: 11.5 10*3/uL — ABNORMAL HIGH (ref 4.0–10.5)

## 2014-07-05 LAB — I-STAT TROPONIN, ED: Troponin i, poc: 0.01 ng/mL (ref 0.00–0.08)

## 2014-07-05 MED ORDER — ASPIRIN 325 MG PO TABS
325.0000 mg | ORAL_TABLET | Freq: Once | ORAL | Status: AC
  Administered 2014-07-05: 325 mg via ORAL
  Filled 2014-07-05: qty 1

## 2014-07-05 MED ORDER — LEVOFLOXACIN 500 MG PO TABS: 500.0000 mg | ORAL_TABLET | Freq: Every day | ORAL | Status: DC

## 2014-07-05 MED ORDER — LEVOFLOXACIN IN D5W 750 MG/150ML IV SOLN
750.0000 mg | Freq: Once | INTRAVENOUS | Status: AC
  Administered 2014-07-05: 750 mg via INTRAVENOUS
  Filled 2014-07-05: qty 150

## 2014-07-05 MED ORDER — SODIUM CHLORIDE 0.9 % IV SOLN
Freq: Once | INTRAVENOUS | Status: AC
  Administered 2014-07-05: 07:00:00 via INTRAVENOUS

## 2014-07-05 NOTE — ED Provider Notes (Signed)
CSN: [id number]     Arrival date & time 07/05/14  7829 History   First MD Initiated Contact with Patient 07/05/14 0458     Chief Complaint  Patient presents with  . Shortness of Breath     (Consider location/radiation/quality/duration/timing/severity/associated sxs/prior Treatment) HPI Pt is a 73yo male with hx of HTN, hypercholesteremia, CAD, angina, adenosquamous carcinoma of lung LLL, with lobectomy in 1999, presenting to ED with c/o progressively worsening SOB with exertion, onset last night.  Pt states he has associated centralized chest pain that is sore and aching, 5/10, constant since onset last night, worse with inspiration. Pt states he is concerned he may have pneumonia but denies fever, chills, n/v/d, cough or congestion. Denies sick contacts or recent travel.  Pt states he does not have stents. Had a normal stress test last year.   Past Medical History  Diagnosis Date  . Hypertension   . Hypercholesteremia   . Glucose intolerance (impaired glucose tolerance)   . Kidney stones, calcium oxalate   . CAD (coronary artery disease)   . 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 Adenomatous colon polyp     Dr. Amedeo Plenty  . Prostate cancer 2009    Dr. Eulogio Ditch; tx w/ radium implants  . Lung cancer    Past Surgical History  Procedure Laterality Date  . Lobectomy  1999    left - Dr. Cyndia Bent  . Repair right testicular torsion  1962  . Cardiac catheterization  1997    Dr. Martinique  . Prostate radium implants  2009  . Lung cancer surgery  2003  . Colonoscopy  ;11/12/99;04/29/03    12/27/09  . Lumbar laminectomy/decompression microdiscectomy  08/10/2012    Procedure: LUMBAR LAMINECTOMY/DECOMPRESSION MICRODISCECTOMY 1 LEVEL;  Surgeon: Winfield Cunas, MD;  Location: [place] [facility];  Service: Neurosurgery;  Laterality: Left;  LEFT Lumbar five-sacral one microdiskectomy   Family History  Problem Relation Age of Onset  . Heart disease Mother   . Hypertension Mother   . Heart  disease Father   . Hypertension Father   . Stroke Father   . Other Brother     spinal meningitis  . Other Brother     MVA  . Cancer Sister     stomach   History  Substance Use Topics  . Smoking status: Former Smoker -- 2.00 packs/day for 35 years    Types: Cigarettes    Quit date: 10/17/1982  . Smokeless tobacco: Former Systems developer    Types: Chew    Quit date: 03/15/2012  . Alcohol Use: 7.0 oz/week    14 drink(s) per week     Comment: 2 drinks a day before supper but has not had in 1 month    Review of Systems  Constitutional: Negative for fever and chills.  Respiratory: Positive for shortness of breath. Negative for cough.   Cardiovascular: Positive for chest pain ( centralized). Negative for palpitations.  Gastrointestinal: Positive for abdominal pain (epigastric). Negative for nausea, vomiting and diarrhea.  All other systems reviewed and are negative.     Allergies  Codeine  Home Medications   Prior to Admission medications   Medication Sig Start Date End Date Taking? Authorizing Provider  allopurinol (ZYLOPRIM) 100 MG tablet Take 100 mg by mouth Daily. 07/23/12  Yes Historical Provider, MD  ALPRAZolam Duanne Moron) 0.5 MG tablet Take 0.5 mg by mouth 3 (three) times daily as needed for anxiety.    Yes Historical Provider, MD  aspirin 81 MG tablet  Take 81 mg by mouth daily.   Yes Historical Provider, MD  atorvastatin (LIPITOR) 40 MG tablet Take 40 mg by mouth daily.   Yes Historical Provider, MD  colchicine 0.6 MG tablet Take 0.6 mg by mouth daily.   Yes Historical Provider, MD  Cyanocobalamin (VITAMIN B 12 PO) Take 1,000 mcg by mouth daily.     Yes Historical Provider, MD  folic acid (FOLVITE) 1 MG tablet Take 1 mg by mouth daily.   Yes Historical Provider, MD  glucosamine-chondroitin 500-400 MG tablet Take 1 tablet by mouth 2 (two) times daily.    Yes Historical Provider, MD  lisinopril (PRINIVIL,ZESTRIL) 10 MG tablet Take 10 mg by mouth daily.   Yes Historical Provider, MD   meloxicam (MOBIC) 15 MG tablet Take 15 mg by mouth daily. 01/05/13  Yes Historical Provider, MD  methotrexate (RHEUMATREX) 2.5 MG tablet Take 15 mg by mouth once a week. Caution:Chemotherapy. Protect from light.   Yes Historical Provider, MD  nitroGLYCERIN (NITROSTAT) 0.4 MG SL tablet Place 0.4 mg under the tongue every 5 (five) minutes as needed. For chest pain   Yes Historical Provider, MD  predniSONE (DELTASONE) 5 MG tablet Take 5 mg by mouth daily with breakfast.   Yes Historical Provider, MD  verapamil (CALAN-SR) 240 MG CR tablet Take 240 mg by mouth 2 (two) times daily.     Yes Historical Provider, MD  levofloxacin (LEVAQUIN) 500 MG tablet Take 1 tablet (500 mg total) by mouth daily. 07/05/14   Noland Fordyce, PA-C   BP 127/72  Pulse 64  Temp(Src) 98.7 F (37.1 C) (Oral)  Resp 20  SpO2 98% Physical Exam  Nursing note and vitals reviewed. Constitutional: He appears well-developed and well-nourished.  Elderly male lying in exam bed, NAD  HENT:  Head: Normocephalic and atraumatic.  Eyes: Conjunctivae are normal. No scleral icterus.  Neck: Normal range of motion.  Cardiovascular: Normal rate, regular rhythm and normal heart sounds.   Regular rate and rhythm  Pulmonary/Chest: Breath sounds normal. He is in respiratory distress. He has no wheezes. He has no rales. He exhibits no tenderness.  Mild shortness of breath, pt appears winded between sentences.  Decreased breath sounds in lower lung fields.   Abdominal: Soft. Bowel sounds are normal. He exhibits no distension and no mass. There is no tenderness. There is no rebound and no guarding.  Musculoskeletal: Normal range of motion.  Neurological: He is alert.  Skin: Skin is warm and dry.    ED Course  Procedures (including critical care time) Labs Review Labs Reviewed  CBC WITH DIFFERENTIAL - Abnormal; Notable for the following:    WBC 11.5 (*)    RBC 3.72 (*)    MCV 108.3 (*)    MCH 36.8 (*)    Platelets 123 (*)    Neutro Abs  8.3 (*)    Monocytes Absolute 1.3 (*)    All other components within normal limits  COMPREHENSIVE METABOLIC PANEL - Abnormal; Notable for the following:    Glucose, Bld 134 (*)    Total Bilirubin 2.0 (*)    GFR calc non Af Amer 67 (*)    GFR calc Af Amer 77 (*)    All other components within normal limits  PRO B NATRIURETIC PEPTIDE  I-STAT TROPOININ, ED    Imaging Review Dg Chest 2 View  07/05/2014   CLINICAL DATA:  Shortness of breath, chest pain and bilateral shoulder pain.  EXAM: CHEST  2 VIEW  COMPARISON:  Chest radiograph  performed 05/22/2014  FINDINGS: The lungs are mildly hypoexpanded. Mild right basilar opacity may reflect atelectasis or possibly mild pneumonia. There is no evidence of pleural effusion or pneumothorax.  The heart is normal in size; the mediastinal contour is within normal limits. No acute osseous abnormalities are seen.  IMPRESSION: Mild right basilar opacity may reflect atelectasis or possibly mild pneumonia. Lungs mildly hypoexpanded.   Electronically Signed   By: Garald Balding M.D.   On: 07/05/2014 05:55     EKG Interpretation   Date/Time:  Saturday July 05 2014 04:54:44 EDT Ventricular Rate:  78 PR Interval:  178 QRS Duration: 98 QT Interval:  394 QTC Calculation: 449 R Axis:   -5 Text Interpretation:  Sinus rhythm with occasional Premature ventricular  complexes Otherwise normal ECG No new changes Confirmed by Kathrynn Humble, MD,  Thelma Comp [phone]) on 07/05/2014 5:20:18 AM      MDM   Final diagnoses:  Right lower lobe pneumonia   Pt is a 73yo male presenting to ED with c/o sudden onset SOB, centralized pleuritic chest pain. Denies cough, congestion, n/v/d, fever or chills but pt states he is concerned he has pneumonia.    Labs: mild leukocytosis, WBC-11.5, otherwise, unremarkable.  CXR: concerning for mild RLL pneumonia.   Discussed pt with Dr. Kathrynn Humble, will tx for CAP.  Pt may be discharged home based off CURB-65 criteria.  Will give 1st dose of  levoquin in ED. Return precautions provided. Pt verbalized understanding and agreement with tx plan.   Noland Fordyce, PA-C 07/05/14 [phone]

## 2014-07-05 NOTE — ED Notes (Signed)
Pt. reports progressing SOB with exertion onset last night , denies cough or congestion , no fever or chills.

## 2014-07-05 NOTE — ED Provider Notes (Signed)
Medical screening examination/treatment/procedure(s) were conducted as a shared visit with non-physician practitioner(s) and myself.  I personally evaluated the patient during the encounter.   EKG Interpretation   Date/Time:  Saturday July 05 2014 04:54:44 EDT Ventricular Rate:  78 PR Interval:  178 QRS Duration: 98 QT Interval:  394 QTC Calculation: 449 R Axis:   -5 Text Interpretation:  Sinus rhythm with occasional Premature ventricular  complexes Otherwise normal ECG No new changes Confirmed by Billie Intriago, MD,  Humberto Addo ([id number]) on 07/05/2014 5:20:18 AM      Pt comes in with cc of dib, central chest pain - pleuritic. No cough, no fevers - but has had PNA before w/o them. CXR shows infiltrate. Vitals WNL currently, no hypoxia. Return precautions discussed.  Varney Biles, MD 07/05/14 [phone]

## 2014-07-09 DIAGNOSIS — J189 Pneumonia, unspecified organism: Secondary | ICD-10-CM | POA: Diagnosis not present

## 2014-07-14 ENCOUNTER — Emergency Department (HOSPITAL_COMMUNITY): Payer: Medicare Other

## 2014-07-14 ENCOUNTER — Observation Stay (HOSPITAL_COMMUNITY)
Admission: EM | Admit: 2014-07-14 | Discharge: 2014-07-15 | Disposition: A | Discharge status: Home / Self Care | Payer: Medicare Other | Attending: Internal Medicine | Admitting: Internal Medicine

## 2014-07-14 ENCOUNTER — Observation Stay (HOSPITAL_COMMUNITY): Payer: Medicare Other

## 2014-07-14 ENCOUNTER — Encounter (HOSPITAL_COMMUNITY): Payer: Self-pay | Admitting: Emergency Medicine

## 2014-07-14 DIAGNOSIS — Z85118 Personal history of other malignant neoplasm of bronchus and lung: Secondary | ICD-10-CM | POA: Insufficient documentation

## 2014-07-14 DIAGNOSIS — E7800 Pure hypercholesterolemia, unspecified: Secondary | ICD-10-CM | POA: Insufficient documentation

## 2014-07-14 DIAGNOSIS — Z7982 Long term (current) use of aspirin: Secondary | ICD-10-CM | POA: Insufficient documentation

## 2014-07-14 DIAGNOSIS — Z79899 Other long term (current) drug therapy: Secondary | ICD-10-CM | POA: Insufficient documentation

## 2014-07-14 DIAGNOSIS — Z86718 Personal history of other venous thrombosis and embolism: Secondary | ICD-10-CM | POA: Insufficient documentation

## 2014-07-14 DIAGNOSIS — Z87891 Personal history of nicotine dependence: Secondary | ICD-10-CM | POA: Diagnosis not present

## 2014-07-14 DIAGNOSIS — M069 Rheumatoid arthritis, unspecified: Secondary | ICD-10-CM | POA: Diagnosis present

## 2014-07-14 DIAGNOSIS — R911 Solitary pulmonary nodule: Secondary | ICD-10-CM | POA: Diagnosis not present

## 2014-07-14 DIAGNOSIS — R0902 Hypoxemia: Secondary | ICD-10-CM | POA: Diagnosis not present

## 2014-07-14 DIAGNOSIS — I2699 Other pulmonary embolism without acute cor pulmonale: Principal | ICD-10-CM | POA: Diagnosis present

## 2014-07-14 DIAGNOSIS — Z86711 Personal history of pulmonary embolism: Secondary | ICD-10-CM

## 2014-07-14 DIAGNOSIS — I10 Essential (primary) hypertension: Secondary | ICD-10-CM | POA: Insufficient documentation

## 2014-07-14 DIAGNOSIS — I2510 Atherosclerotic heart disease of native coronary artery without angina pectoris: Secondary | ICD-10-CM | POA: Diagnosis not present

## 2014-07-14 DIAGNOSIS — R0609 Other forms of dyspnea: Secondary | ICD-10-CM | POA: Diagnosis present

## 2014-07-14 DIAGNOSIS — J189 Pneumonia, unspecified organism: Secondary | ICD-10-CM | POA: Diagnosis not present

## 2014-07-14 DIAGNOSIS — Z8709 Personal history of other diseases of the respiratory system: Secondary | ICD-10-CM

## 2014-07-14 DIAGNOSIS — C3490 Malignant neoplasm of unspecified part of unspecified bronchus or lung: Secondary | ICD-10-CM | POA: Diagnosis not present

## 2014-07-14 DIAGNOSIS — IMO0002 Reserved for concepts with insufficient information to code with codable children: Secondary | ICD-10-CM | POA: Insufficient documentation

## 2014-07-14 DIAGNOSIS — R0602 Shortness of breath: Secondary | ICD-10-CM | POA: Diagnosis not present

## 2014-07-14 DIAGNOSIS — J90 Pleural effusion, not elsewhere classified: Secondary | ICD-10-CM | POA: Diagnosis not present

## 2014-07-14 HISTORY — DX: Personal history of other diseases of the respiratory system: Z87.09

## 2014-07-14 HISTORY — DX: Personal history of pulmonary embolism: Z86.711

## 2014-07-14 LAB — BASIC METABOLIC PANEL WITH GFR
Anion gap: 12 (ref 5–15)
BUN: 25 mg/dL — ABNORMAL HIGH (ref 6–23)
CO2: 23 meq/L (ref 19–32)
Calcium: 9.5 mg/dL (ref 8.4–10.5)
Chloride: 101 meq/L (ref 96–112)
Creatinine, Ser: 1.1 mg/dL (ref 0.50–1.35)
GFR calc Af Amer: 75 mL/min — ABNORMAL LOW (ref >90)
GFR calc non Af Amer: 65 mL/min — ABNORMAL LOW (ref >90)
Glucose, Bld: 149 mg/dL — ABNORMAL HIGH (ref 70–99)
Potassium: 4.8 meq/L (ref 3.7–5.3)
Sodium: 136 meq/L — ABNORMAL LOW (ref 137–147)

## 2014-07-14 LAB — CBC
HCT: 36.4 % — ABNORMAL LOW (ref 39.0–52.0)
HEMATOCRIT: 36.4 % — AB (ref 39.0–52.0)
Hemoglobin: 12.2 g/dL — ABNORMAL LOW (ref 13.0–17.0)
Hemoglobin: 12.3 g/dL — ABNORMAL LOW (ref 13.0–17.0)
MCH: 36.6 pg — ABNORMAL HIGH (ref 26.0–34.0)
MCH: 36.7 pg — ABNORMAL HIGH (ref 26.0–34.0)
MCHC: 33.5 g/dL (ref 30.0–36.0)
MCHC: 33.8 g/dL (ref 30.0–36.0)
MCV: 109.3 fL — ABNORMAL HIGH (ref 78.0–100.0)
MCV: 108.7 fL — ABNORMAL HIGH (ref 78.0–100.0)
Platelets: 336 K/uL (ref 150–400)
Platelets: 324 10*3/uL (ref 150–400)
RBC: 3.33 MIL/uL — ABNORMAL LOW (ref 4.22–5.81)
RBC: 3.35 MIL/uL — ABNORMAL LOW (ref 4.22–5.81)
RDW: 14.7 % (ref 11.5–15.5)
RDW: 14.9 % (ref 11.5–15.5)
WBC: 7.1 K/uL (ref 4.0–10.5)
WBC: 7 10*3/uL (ref 4.0–10.5)

## 2014-07-14 LAB — STREP PNEUMONIAE URINARY ANTIGEN: Strep Pneumo Urinary Antigen: NEGATIVE

## 2014-07-14 LAB — D-DIMER, QUANTITATIVE: D-Dimer, Quant: 4.54 ug/mL-FEU — ABNORMAL HIGH (ref 0.00–0.48)

## 2014-07-14 LAB — CREATININE, SERUM
Creatinine, Ser: 1.24 mg/dL (ref 0.50–1.35)
GFR calc non Af Amer: 56 mL/min — ABNORMAL LOW (ref >90)
GFR, EST AFRICAN AMERICAN: 65 mL/min — AB (ref >90)

## 2014-07-14 MED ORDER — LISINOPRIL 10 MG PO TABS: 10.0000 mg | ORAL_TABLET | Freq: Every day | ORAL | Status: DC

## 2014-07-14 MED ORDER — FOLIC ACID 1 MG PO TABS
1.0000 mg | ORAL_TABLET | Freq: Every day | ORAL | Status: DC
  Administered 2014-07-15: 1 mg via ORAL
  Filled 2014-07-14 (×2): qty 1

## 2014-07-14 MED ORDER — SODIUM CHLORIDE 0.9 % IV SOLN
INTRAVENOUS | Status: DC
  Administered 2014-07-14: 21:00:00 via INTRAVENOUS

## 2014-07-14 MED ORDER — VITAMIN B 12 250 MCG PO LOZG: 1000.0000 ug | LOZENGE | Freq: Every day | ORAL | Status: DC

## 2014-07-14 MED ORDER — ACETAMINOPHEN 500 MG PO TABS: 500.0000 mg | ORAL_TABLET | Freq: Four times a day (QID) | ORAL | Status: DC | PRN

## 2014-07-14 MED ORDER — CETYLPYRIDINIUM CHLORIDE 0.05 % MT LIQD
7.0000 mL | Freq: Two times a day (BID) | OROMUCOSAL | Status: DC
  Administered 2014-07-14 – 2014-07-15 (×2): 7 mL via OROMUCOSAL

## 2014-07-14 MED ORDER — DEXTROSE 5 % IV SOLN
500.0000 mg | Freq: Once | INTRAVENOUS | Status: DC
  Filled 2014-07-14: qty 500

## 2014-07-14 MED ORDER — LISINOPRIL 10 MG PO TABS
10.0000 mg | ORAL_TABLET | Freq: Every day | ORAL | Status: DC
  Administered 2014-07-15: 10 mg via ORAL
  Filled 2014-07-14: qty 1

## 2014-07-14 MED ORDER — ALLOPURINOL 100 MG PO TABS
100.0000 mg | ORAL_TABLET | Freq: Every day | ORAL | Status: DC
  Administered 2014-07-15: 100 mg via ORAL
  Filled 2014-07-14: qty 1

## 2014-07-14 MED ORDER — DEXTROSE 5 % IV SOLN
1.0000 g | Freq: Once | INTRAVENOUS | Status: AC
  Administered 2014-07-14: 1 g via INTRAVENOUS
  Filled 2014-07-14: qty 1

## 2014-07-14 MED ORDER — HEPARIN SODIUM (PORCINE) 5000 UNIT/ML IJ SOLN: 5000.0000 [IU] | Freq: Three times a day (TID) | INTRAMUSCULAR | Status: DC

## 2014-07-14 MED ORDER — SODIUM CHLORIDE 0.9 % IV SOLN
Freq: Once | INTRAVENOUS | Status: AC
  Administered 2014-07-14: 18:00:00 via INTRAVENOUS

## 2014-07-14 MED ORDER — ALPRAZOLAM 0.5 MG PO TABS: 0.5000 mg | ORAL_TABLET | Freq: Three times a day (TID) | ORAL | Status: DC | PRN

## 2014-07-14 MED ORDER — VANCOMYCIN HCL IN DEXTROSE 1-5 GM/200ML-% IV SOLN: 1000.0000 mg | INTRAVENOUS | Status: DC

## 2014-07-14 MED ORDER — CEFTRIAXONE SODIUM 1 G IJ SOLR
1.0000 g | Freq: Once | INTRAMUSCULAR | Status: DC
  Administered 2014-07-14: 1 g via INTRAVENOUS
  Filled 2014-07-14: qty 10

## 2014-07-14 MED ORDER — ASPIRIN 81 MG PO TABS
81.0000 mg | ORAL_TABLET | Freq: Every day | ORAL | Status: DC
Start: 2014-07-14 — End: 2014-07-14

## 2014-07-14 MED ORDER — DEXTROSE 5 % IV SOLN: 1.0000 g | Freq: Three times a day (TID) | INTRAVENOUS | Status: DC

## 2014-07-14 MED ORDER — METHOTREXATE 2.5 MG PO TABS: 15.0000 mg | ORAL_TABLET | ORAL | Status: DC

## 2014-07-14 MED ORDER — DEXTROSE 5 % IV SOLN
1.0000 g | Freq: Three times a day (TID) | INTRAVENOUS | Status: DC
  Filled 2014-07-14: qty 1

## 2014-07-14 MED ORDER — HEPARIN BOLUS VIA INFUSION
4000.0000 [IU] | Freq: Once | INTRAVENOUS | Status: AC
  Administered 2014-07-14: 4000 [IU] via INTRAVENOUS
  Filled 2014-07-14: qty 4000

## 2014-07-14 MED ORDER — VERAPAMIL HCL ER 240 MG PO TBCR
240.0000 mg | EXTENDED_RELEASE_TABLET | Freq: Two times a day (BID) | ORAL | Status: DC
Start: 2014-07-14 — End: 2014-07-15
  Administered 2014-07-14 – 2014-07-15 (×2): 240 mg via ORAL
  Filled 2014-07-14 (×3): qty 1

## 2014-07-14 MED ORDER — NITROGLYCERIN 0.4 MG SL SUBL: 0.4000 mg | SUBLINGUAL_TABLET | SUBLINGUAL | Status: DC | PRN

## 2014-07-14 MED ORDER — ATORVASTATIN CALCIUM 40 MG PO TABS
40.0000 mg | ORAL_TABLET | Freq: Every morning | ORAL | Status: DC
  Administered 2014-07-15: 40 mg via ORAL
  Filled 2014-07-14: qty 1

## 2014-07-14 MED ORDER — VITAMIN B-12 1000 MCG PO TABS
1000.0000 ug | ORAL_TABLET | Freq: Every day | ORAL | Status: DC
  Administered 2014-07-15: 1000 ug via ORAL
  Filled 2014-07-14: qty 1

## 2014-07-14 MED ORDER — PREDNISONE 5 MG PO TABS
5.0000 mg | ORAL_TABLET | Freq: Every day | ORAL | Status: DC
  Administered 2014-07-15: 5 mg via ORAL
  Filled 2014-07-14 (×2): qty 1

## 2014-07-14 MED ORDER — VANCOMYCIN HCL IN DEXTROSE 1-5 GM/200ML-% IV SOLN
1000.0000 mg | Freq: Two times a day (BID) | INTRAVENOUS | Status: DC
  Filled 2014-07-14: qty 200

## 2014-07-14 MED ORDER — HEPARIN (PORCINE) IN NACL 100-0.45 UNIT/ML-% IJ SOLN
1350.0000 [IU]/h | INTRAMUSCULAR | Status: DC
  Administered 2014-07-14: 1350 [IU]/h via INTRAVENOUS
  Filled 2014-07-14 (×2): qty 250

## 2014-07-14 MED ORDER — ASPIRIN 81 MG PO CHEW
81.0000 mg | CHEWABLE_TABLET | Freq: Every day | ORAL | Status: DC
  Administered 2014-07-15: 81 mg via ORAL
  Filled 2014-07-14: qty 1

## 2014-07-14 MED ORDER — VERAPAMIL HCL ER 240 MG PO TBCR: 240.0000 mg | EXTENDED_RELEASE_TABLET | Freq: Two times a day (BID) | ORAL | Status: DC

## 2014-07-14 MED ORDER — IOHEXOL 350 MG/ML SOLN
100.0000 mL | Freq: Once | INTRAVENOUS | Status: AC | PRN
  Administered 2014-07-14: 100 mL via INTRAVENOUS

## 2014-07-14 NOTE — ED Notes (Signed)
Admitting MD in to assess pt for admission at this time

## 2014-07-14 NOTE — ED Notes (Signed)
Blood drawn by haley but not clicked off

## 2014-07-14 NOTE — H&P (Addendum)
Hospitalist Admission History and Physical  Patient name: Daniel Vega Medical record number: [id number] Date of birth: [birth year] Age: 73 y.o. Gender: male  Primary Care Provider: Marjorie Smolder, MD  Chief Complaint: dyspnea, HCAP  History of Present Illness:This is a 73 y.o. year old male with significant past medical history of LLL lung ca s/p lobectomy 1999, prostate ca s/p seeding 2009, hx/o DVT, CAD, HTN, rheumatoid arthritis  presenting with recurrent dyspnea, HCAP. Pt was seen on 9/19 for dyspnea.Was diagnosed with RLL CAP. Was placed on outpt rx af levaquin. Per pt, was seen by PCP 2-3 days later with mildly improved sxs. Was given an additional 7 days of abx. Pt states that sxs mildly improved over the course of the week, however progressively worsened for the past 2-3 days with worsening dyspnea and subjective chills. Pt denies any CP. No nausea, dysuria, diarrhea. Non smoker. No wheezing.  Presented to ER afebrile. Tmax 97.9, HR 90s-100s, 20s-30s, BP 100s-120s, Satting 97% on 2L-desatting to upper 80s with ambulation. WBC WNL @ 7.1. Bloodwork otherwise WNL. CXR shows unchanged RLL consolidation with R pleural effusion.   Assessment and Plan: Daniel Vega is a 73 y.o. year old male presenting with dyspnea, HCAP   Active Problems:   HCAP (healthcare-associated pneumonia)   1- Dyspnea, HCAP  -Will start on vanc and cefepime for HCAP coverage as pt has failed > 10 days of outpt po levaquin  -blood cultures, urine strep and legionella  -Given hx/o lung and prostate ca (both apparently in remission) with hx/o DVT, will check CT angio to assess for PE vs. New lesions.  -check d dimer-if positive, add on LE u/s  -2D ECHO   2-CAD/HTN -no active CP  -hemodynamically stable  -tele bed  -cont home regimen   3-hx/o RA -stable cont home regimen  -may need stress dose steroids if BP drops in setting of chronic prednisone use   FEN/GI: heart healthy diet  Prophylaxis: sub q  heparin  Disposition: pending further evaluation Code Status: Full Code   CLINICAL UPDATE:  + RLL PE ON CTANGIO W/ PULM INFARCT -START ON HEPARIN GTT -D/C VANC AND CEFEPIME -CHECK LE U/S   Patient Active Problem List   Diagnosis Date Noted  . HCAP (healthcare-associated pneumonia) 07/14/2014  . HNP (herniated nucleus pulposus), lumbar 08/10/2012  . Leukocytosis 02/10/2012  . Positive D dimer 02/10/2012  . Community acquired pneumonia- LUL with parapneumonic effusion 02/09/2012  . Dehydration with hyponatremia 02/09/2012  . Hypotension 02/09/2012  . AKI (acute kidney injury) with ATN 02/09/2012  . Hypertension   . Hypercholesteremia   . Glucose intolerance (impaired glucose tolerance)   . Kidney stones, calcium oxalate   . CAD (coronary artery disease)   . Adenosquamous carcinoma of lung   . Adenomatous colon polyp   . Prostate cancer    Past Medical History: Past Medical History  Diagnosis Date  . Hypertension   . Hypercholesteremia   . Glucose intolerance (impaired glucose tolerance)   . Kidney stones, calcium oxalate   . CAD (coronary artery disease)   . 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 Adenomatous colon polyp     Dr. Amedeo Plenty  . Prostate cancer 2009    Dr. Eulogio Ditch; tx w/ radium implants  . Lung cancer     Past Surgical History: Past Surgical History  Procedure Laterality Date  . Lobectomy  1999    left - Dr. Cyndia Bent  . Repair right testicular torsion  1962  .  Cardiac catheterization  1997    Dr. Martinique  . Prostate radium implants  2009  . Lung cancer surgery  2003  . Colonoscopy  ;11/12/99;04/29/03    12/27/09  . Lumbar laminectomy/decompression microdiscectomy  08/10/2012    Procedure: LUMBAR LAMINECTOMY/DECOMPRESSION MICRODISCECTOMY 1 LEVEL;  Surgeon: Winfield Cunas, MD;  Location: [place] [facility];  Service: Neurosurgery;  Laterality: Left;  LEFT Lumbar five-sacral one microdiskectomy    Social History: History   Social History  .  Marital Status: Married    Spouse Name: N/A    Number of Children: 75  . Years of Education: N/A   Occupational History  . carpenter     [place] History Main Topics  . Smoking status: Former Smoker -- 2.00 packs/day for 35 years    Types: Cigarettes    Quit date: 10/17/1982  . Smokeless tobacco: Former Systems developer    Types: Chew    Quit date: 03/15/2012  . Alcohol Use: 7.0 oz/week    14 drink(s) per week     Comment: 2 drinks a day before supper but has not had in 1 month  . Drug Use: No  . Sexual Activity: Yes    Birth Control/ Protection: None   Other Topics Concern  . None   Social History Narrative  . None    Family History: Family History  Problem Relation Age of Onset  . Heart disease Mother   . Hypertension Mother   . Heart disease Father   . Hypertension Father   . Stroke Father   . Other Brother     spinal meningitis  . Other Brother     MVA  . Cancer Sister     stomach    Allergies: Allergies  Allergen Reactions  . Codeine Palpitations    Made heart hurt    Current Facility-Administered Medications  Medication Dose Route Frequency Provider Last Rate Last Dose  . 0.9 %  sodium chloride infusion   Intravenous Continuous Shanda Howells, MD      . ceFEPIme (MAXIPIME) 1 g in dextrose 5 % 50 mL IVPB  1 g Intravenous Once Christopher W Lawyer, PA-C      . ceFEPIme (MAXIPIME) 1 g in dextrose 5 % 50 mL IVPB  1 g Intravenous 3 times per day Shanda Howells, MD      . heparin injection 5,000 Units  5,000 Units Subcutaneous 3 times per day Shanda Howells, MD      . vancomycin (VANCOCIN) IVPB 1000 mg/200 mL premix  1,000 mg Intravenous NOW Anh P Pham, RPH      . [START ON 07/15/2014] vancomycin (VANCOCIN) IVPB 1000 mg/200 mL premix  1,000 mg Intravenous Q12H Anh P Pham, RPH       Current Outpatient Prescriptions  Medication Sig Dispense Refill  . acetaminophen (TYLENOL) 500 MG tablet Take 500 mg by mouth every 6 (six) hours as needed for moderate pain.       Marland Kitchen allopurinol (ZYLOPRIM) 100 MG tablet Take 100 mg by mouth Daily.      Marland Kitchen ALPRAZolam (XANAX) 0.5 MG tablet Take 0.5 mg by mouth 3 (three) times daily as needed for anxiety.       Marland Kitchen aspirin 81 MG tablet Take 81 mg by mouth daily.      Marland Kitchen atorvastatin (LIPITOR) 40 MG tablet Take 40 mg by mouth every morning.       . Cyanocobalamin (VITAMIN B 12 PO) Take 1,000 mcg by mouth daily.        Marland Kitchen  folic acid (FOLVITE) 1 MG tablet Take 1 mg by mouth daily.      Marland Kitchen glucosamine-chondroitin 500-400 MG tablet Take 1 tablet by mouth 2 (two) times daily.       Marland Kitchen levofloxacin (LEVAQUIN) 500 MG tablet Take 1 tablet (500 mg total) by mouth daily.  7 tablet  0  . lisinopril (PRINIVIL,ZESTRIL) 10 MG tablet Take 10 mg by mouth daily.      . methotrexate (RHEUMATREX) 2.5 MG tablet Take 15 mg by mouth every Wednesday. Caution:Chemotherapy. Protect from light.      . predniSONE (DELTASONE) 5 MG tablet Take 5 mg by mouth daily with breakfast.      . verapamil (CALAN-SR) 240 MG CR tablet Take 240 mg by mouth 2 (two) times daily.        . nitroGLYCERIN (NITROSTAT) 0.4 MG SL tablet Place 0.4 mg under the tongue every 5 (five) minutes as needed. For chest pain       Review Of Systems: 12 point ROS negative except as noted above in HPI.  Physical Exam: Filed Vitals:   07/14/14 1715  BP: 116/72  Pulse: 91  Temp:   Resp: 28    General: alert and cooperative HEENT: PERRLA and extra ocular movement intact Heart: S1, S2 normal, no murmur, rub or gallop, regular rate and rhythm Lungs: no wheezes or rales and mildly labored breathing, decreased breath sounds in RLL  Abdomen: abdomen is soft without significant tenderness, masses, organomegaly or guarding Extremities: extremities normal, atraumatic, no cyanosis or edema Skin:no rashes, no ecchymoses Neurology: normal without focal findings  Labs and Imaging: Lab Results  Component Value Date/Time   NA 136* 07/14/2014  1:41 PM   NA 140 10/25/2013  9:55 AM   K 4.8 07/14/2014   1:41 PM   K 4.5 10/25/2013  9:55 AM   CL 101 07/14/2014  1:41 PM   CL 108* 01/24/2013  3:18 PM   CO2 23 07/14/2014  1:41 PM   CO2 23 10/25/2013  9:55 AM   BUN 25* 07/14/2014  1:41 PM   BUN 19.7 10/25/2013  9:55 AM   CREATININE 1.10 07/14/2014  1:41 PM   CREATININE 1.0 10/25/2013  9:55 AM   GLUCOSE 149* 07/14/2014  1:41 PM   GLUCOSE 112 10/25/2013  9:55 AM   GLUCOSE 109* 01/24/2013  3:18 PM   Lab Results  Component Value Date   WBC 7.1 07/14/2014   HGB 12.2* 07/14/2014   HCT 36.4* 07/14/2014   MCV 109.3* 07/14/2014   PLT 336 07/14/2014    Dg Chest 2 View  07/14/2014   CLINICAL DATA:  Low oxygen saturation.  EXAM: CHEST  2 VIEW  COMPARISON:  07/05/2014.  05/22/2014.  FINDINGS: Opacity at the right base is stable since the previous study but new since 05/22/2014. This may be related atelectasis although infection could have this appearance. Probable small associated right pleural effusion. Left lung is clear. The cardiopericardial silhouette is within normal limits for size. Imaged bony structures of the thorax are intact.  IMPRESSION: Stable. Right base atelectasis or pneumonia with small right pleural effusion. Overall imaging features are stable in the 9 days since the prior study.   Electronically Signed   By: Misty Stanley M.D.   On: 07/14/2014 15:17           Shanda Howells MD  Pager: [phone]

## 2014-07-14 NOTE — ED Notes (Signed)
Place pt on 2L [place] per MD order

## 2014-07-14 NOTE — Progress Notes (Signed)
ANTICOAGULATION CONSULT NOTE - Initial Consult  Pharmacy Consult for heparin Indication: pulmonary embolus  Allergies  Allergen Reactions  . Codeine Palpitations    Made heart hurt    Patient Measurements: Height: 5\' 6"  (167.6 cm) Weight: 192 lb (87.091 kg) IBW/kg (Calculated) : 63.8 Heparin Dosing Weight: 82kg  Vital Signs: Temp: 98.3 F (36.8 C) (09/28 2004) Temp src: Oral (09/28 2004) BP: 125/62 mmHg (09/28 2004) Pulse Rate: 90 (09/28 2004)  Labs:  Recent Labs  07/14/14 1341 07/14/14 1901  HGB 12.2* 12.3*  HCT 36.4* 36.4*  PLT 336 324  CREATININE 1.10 1.24    Estimated Creatinine Clearance: 55.7 ml/min (by C-G formula based on Cr of 1.24).   Medical History: Past Medical History  Diagnosis Date  . Hypertension   . Hypercholesteremia   . Glucose intolerance (impaired glucose tolerance)   . Kidney stones, calcium oxalate   . CAD (coronary artery disease)   . 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 Adenomatous colon polyp     Dr. Amedeo Plenty  . Prostate cancer 2009    Dr. Eulogio Ditch; tx w/ radium implants  . Lung cancer      Assessment: 37 YOM with history of LLL lung CA s/p lobectomy in 1999 and DVT who is presenting with dyspnea. Started on antibiotics for PNA with mild improvement. CXR shows RLL consolidation. CT angio performed d/t history which shows RLL PE without heart strain. Not on anticoagulation PTA. Baseline Hgb 12.3, plts 324. No bleeding noted  Goal of Therapy:  Heparin level 0.3-0.7 units/ml Monitor platelets by anticoagulation protocol: Yes   Plan:  1. Heparin bolus with 4000 units IV x1, then start drip at 1350units/hr 2. Heparin level in 8 hours after starting 3. Daily heparin level and CBC 4. Follow for s/s bleeding 5. Follow for start of long term anticoagulation  Markeda Narvaez D. Kalen Neidert, PharmD, BCPS Clinical Pharmacist Pager: [phone] 07/14/2014 8:52 PM

## 2014-07-14 NOTE — ED Notes (Signed)
Pt sent from MD office for low sats with walking, 87-88 % on RA, sats good when sitting, 90's. Was dx with PNA here on the 19th and also now states he has pain that comes around to his right lower back with breathing. Worse with exertion. Able to speak in complete sentences. Hx of lung CA. PNA on RLL.

## 2014-07-14 NOTE — ED Notes (Signed)
Pt denies having a cough.  Pt alert and oriented x's 3.  Skin warm and dry, color appropriate

## 2014-07-14 NOTE — ED Notes (Signed)
Pt to CT at this time.

## 2014-07-14 NOTE — ED Provider Notes (Signed)
CSN: [id number]     Arrival date & time 07/14/14  1325 History   First MD Initiated Contact with Patient 07/14/14 1647     Chief Complaint  Patient presents with  . Pneumonia    low sats with movement     (Consider location/radiation/quality/duration/timing/severity/associated sxs/prior Treatment) HPI Patient presents to the emergency department with continued cough and shortness of breath with a diagnosis of community-acquired pneumonia.  The patient was seen here in the emergency department about 10 days, ago, followed by his primary care doctor, who sent him in today for admission.  The patient, states, that he has increased, shortness of breath, with any activity.  The patient, states, that he was given Levaquin and he finished a course of treatment.  Patient denies nausea, vomiting, fever, weakness, dizziness, headache, blurred vision, neck pain, rash, sore throat, nasal congestion, dysuria, or syncope.  The patient, states, that he has had some discomfort in the right lower chest and into his back Past Medical History  Diagnosis Date  . Hypertension   . Hypercholesteremia   . Glucose intolerance (impaired glucose tolerance)   . Kidney stones, calcium oxalate   . CAD (coronary artery disease)   . 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 Adenomatous colon polyp     Dr. Amedeo Plenty  . Prostate cancer 2009    Dr. Eulogio Ditch; tx w/ radium implants  . Lung cancer    Past Surgical History  Procedure Laterality Date  . Lobectomy  1999    left - Dr. Cyndia Bent  . Repair right testicular torsion  1962  . Cardiac catheterization  1997    Dr. Martinique  . Prostate radium implants  2009  . Lung cancer surgery  2003  . Colonoscopy  ;11/12/99;04/29/03    12/27/09  . Lumbar laminectomy/decompression microdiscectomy  08/10/2012    Procedure: LUMBAR LAMINECTOMY/DECOMPRESSION MICRODISCECTOMY 1 LEVEL;  Surgeon: Winfield Cunas, MD;  Location: [facility];  Service: Neurosurgery;  Laterality: Left;   LEFT Lumbar five-sacral one microdiskectomy   Family History  Problem Relation Age of Onset  . Heart disease Mother   . Hypertension Mother   . Heart disease Father   . Hypertension Father   . Stroke Father   . Other Brother     spinal meningitis  . Other Brother     MVA  . Cancer Sister     stomach   History  Substance Use Topics  . Smoking status: Former Smoker -- 2.00 packs/day for 35 years    Types: Cigarettes    Quit date: 10/17/1982  . Smokeless tobacco: Former Systems developer    Types: Chew    Quit date: 03/15/2012  . Alcohol Use: 7.0 oz/week    14 drink(s) per week     Comment: 2 drinks a day before supper but has not had in 1 month    Review of Systems All other systems negative except as documented in the HPI. All pertinent positives and negatives as reviewed in the HPI.   Allergies  Codeine  Home Medications   Prior to Admission medications   Medication Sig Start Date End Date Taking? Authorizing Provider  acetaminophen (TYLENOL) 500 MG tablet Take 500 mg by mouth every 6 (six) hours as needed for moderate pain.   Yes Historical Provider, MD  allopurinol (ZYLOPRIM) 100 MG tablet Take 100 mg by mouth Daily. 07/23/12  Yes Historical Provider, MD  ALPRAZolam Duanne Moron) 0.5 MG tablet Take 0.5 mg by mouth 3 (three)  times daily as needed for anxiety.    Yes Historical Provider, MD  aspirin 81 MG tablet Take 81 mg by mouth daily.   Yes Historical Provider, MD  atorvastatin (LIPITOR) 40 MG tablet Take 40 mg by mouth every morning.    Yes Historical Provider, MD  Cyanocobalamin (VITAMIN B 12 PO) Take 1,000 mcg by mouth daily.     Yes Historical Provider, MD  folic acid (FOLVITE) 1 MG tablet Take 1 mg by mouth daily.   Yes Historical Provider, MD  glucosamine-chondroitin 500-400 MG tablet Take 1 tablet by mouth 2 (two) times daily.    Yes Historical Provider, MD  levofloxacin (LEVAQUIN) 500 MG tablet Take 1 tablet (500 mg total) by mouth daily. 07/05/14  Yes Noland Fordyce, PA-C   lisinopril (PRINIVIL,ZESTRIL) 10 MG tablet Take 10 mg by mouth daily.   Yes Historical Provider, MD  methotrexate (RHEUMATREX) 2.5 MG tablet Take 15 mg by mouth every Wednesday. Caution:Chemotherapy. Protect from light.   Yes Historical Provider, MD  predniSONE (DELTASONE) 5 MG tablet Take 5 mg by mouth daily with breakfast.   Yes Historical Provider, MD  verapamil (CALAN-SR) 240 MG CR tablet Take 240 mg by mouth 2 (two) times daily.     Yes Historical Provider, MD  nitroGLYCERIN (NITROSTAT) 0.4 MG SL tablet Place 0.4 mg under the tongue every 5 (five) minutes as needed. For chest pain    Historical Provider, MD   BP 116/72  Pulse 91  Temp(Src) 97.9 F (36.6 C) (Oral)  Resp 28  Ht 5\' 6"  (1.676 m)  Wt 192 lb (87.091 kg)  BMI 31.00 kg/m2  SpO2 99% Physical Exam  Nursing note and vitals reviewed. Constitutional: He is oriented to person, place, and time. He appears well-developed and well-nourished. No distress.  HENT:  Head: Normocephalic and atraumatic.  Mouth/Throat: Oropharynx is clear and moist.  Eyes: Pupils are equal, round, and reactive to light.  Neck: Normal range of motion. Neck supple.  Cardiovascular: Normal rate, regular rhythm and normal heart sounds.  Exam reveals no gallop and no friction rub.   No murmur heard. Pulmonary/Chest: Effort normal.  Patient has crackles and abnormal lung sounds in the right lower lung field  Neurological: He is alert and oriented to person, place, and time. He exhibits normal muscle tone. Coordination normal.  Skin: Skin is warm and dry. No rash noted. No erythema.    ED Course  Procedures (including critical care time) Labs Review Labs Reviewed  CBC - Abnormal; Notable for the following:    RBC 3.33 (*)    Hemoglobin 12.2 (*)    HCT 36.4 (*)    MCV 109.3 (*)    MCH 36.6 (*)    All other components within normal limits  BASIC METABOLIC PANEL - Abnormal; Notable for the following:    Sodium 136 (*)    Glucose, Bld 149 (*)    BUN  25 (*)    GFR calc non Af Amer 65 (*)    GFR calc Af Amer 75 (*)    All other components within normal limits  CULTURE, BLOOD (ROUTINE X 2)  CULTURE, BLOOD (ROUTINE X 2)    Imaging Review Dg Chest 2 View  07/14/2014   CLINICAL DATA:  Low oxygen saturation.  EXAM: CHEST  2 VIEW  COMPARISON:  07/05/2014.  05/22/2014.  FINDINGS: Opacity at the right base is stable since the previous study but new since 05/22/2014. This may be related atelectasis although infection could have this appearance.  Probable small associated right pleural effusion. Left lung is clear. The cardiopericardial silhouette is within normal limits for size. Imaged bony structures of the thorax are intact.  IMPRESSION: Stable. Right base atelectasis or pneumonia with small right pleural effusion. Overall imaging features are stable in the 9 days since the prior study.   Electronically Signed   By: Misty Stanley M.D.   On: 07/14/2014 15:17    Patient will be admitted to the hospital for further evaluation.  I spoke with the, [facility], who will admit the patient    Brent General, Hershal Coria 07/14/14 1905

## 2014-07-14 NOTE — Evaluation (Addendum)
Got call about pt from radiologist- + PE on CT Angiogram with pulmonary infarct Will start pt on heparin gtt  Check LE u/s as well

## 2014-07-14 NOTE — ED Notes (Signed)
Attempted report 

## 2014-07-14 NOTE — Progress Notes (Signed)
ANTIBIOTIC CONSULT NOTE - INITIAL  Pharmacy Consult for vancomycin Indication: pneumonia  Allergies  Allergen Reactions  . Codeine Palpitations    Made heart hurt    Patient Measurements: Height: 5\' 6"  (167.6 cm) Weight: 192 lb (87.091 kg) IBW/kg (Calculated) : 63.8   Vital Signs: Temp: 97.9 F (36.6 C) (09/28 1706) Temp src: Oral (09/28 1706) BP: 116/72 mmHg (09/28 1715) Pulse Rate: 91 (09/28 1715) Intake/Output from previous day:   Intake/Output from this shift:    Labs:  Recent Labs  07/14/14 1341  WBC 7.1  HGB 12.2*  PLT 336  CREATININE 1.10   Estimated Creatinine Clearance: 62.8 ml/min (by C-G formula based on Cr of 1.1). No results found for this basename: VANCOTROUGH, VANCOPEAK, VANCORANDOM, GENTTROUGH, GENTPEAK, GENTRANDOM, TOBRATROUGH, TOBRAPEAK, TOBRARND, AMIKACINPEAK, AMIKACINTROU, AMIKACIN,  in the last 72 hours   Microbiology: No results found for this or any previous visit (from the past 720 hour(s)).  Medical History: Past Medical History  Diagnosis Date  . Hypertension   . Hypercholesteremia   . Glucose intolerance (impaired glucose tolerance)   . Kidney stones, calcium oxalate   . CAD (coronary artery disease)   . 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 Adenomatous colon polyp     Dr. Amedeo Plenty  . Prostate cancer 2009    Dr. Eulogio Ditch; tx w/ radium implants  . Lung cancer     Medications:  See med rec   Assessment: Patient is a 73 y.o who was referred to the ED by his PCP for SOB .  He was on levaquin PTA for PNA.  Chest x-ray today with noted small right pleural effusion and "right base atelectasis or pneumonia." He received ceftriaxone 1gm IV x1 on the ED at 1814.  Goal of Therapy:  Vancomycin trough level 15-20 mcg/ml  Plan:  1) vancomycin 1gm IV q12h 2) will check level if/when appropriate  Miho Monda P 07/14/2014,6:31 PM

## 2014-07-15 DIAGNOSIS — I2699 Other pulmonary embolism without acute cor pulmonale: Principal | ICD-10-CM

## 2014-07-15 DIAGNOSIS — I10 Essential (primary) hypertension: Secondary | ICD-10-CM | POA: Diagnosis not present

## 2014-07-15 DIAGNOSIS — M069 Rheumatoid arthritis, unspecified: Secondary | ICD-10-CM | POA: Diagnosis present

## 2014-07-15 DIAGNOSIS — I369 Nonrheumatic tricuspid valve disorder, unspecified: Secondary | ICD-10-CM | POA: Diagnosis not present

## 2014-07-15 HISTORY — PX: TRANSTHORACIC ECHOCARDIOGRAM: SHX275

## 2014-07-15 LAB — LEGIONELLA ANTIGEN, URINE

## 2014-07-15 LAB — CBC WITH DIFFERENTIAL/PLATELET
Basophils Absolute: 0.1 K/uL (ref 0.0–0.1)
Basophils Relative: 1 % (ref 0–1)
Eosinophils Absolute: 0.1 K/uL (ref 0.0–0.7)
Eosinophils Relative: 3 % (ref 0–5)
HCT: 31.1 % — ABNORMAL LOW (ref 39.0–52.0)
Hemoglobin: 10.5 g/dL — ABNORMAL LOW (ref 13.0–17.0)
Lymphocytes Relative: 19 % (ref 12–46)
Lymphs Abs: 1 K/uL (ref 0.7–4.0)
MCH: 36.7 pg — ABNORMAL HIGH (ref 26.0–34.0)
MCHC: 33.8 g/dL (ref 30.0–36.0)
MCV: 108.7 fL — ABNORMAL HIGH (ref 78.0–100.0)
Monocytes Absolute: 0.6 K/uL (ref 0.1–1.0)
Monocytes Relative: 10 % (ref 3–12)
Neutro Abs: 3.7 K/uL (ref 1.7–7.7)
Neutrophils Relative %: 67 % (ref 43–77)
Platelets: 279 K/uL (ref 150–400)
RBC: 2.86 MIL/uL — ABNORMAL LOW (ref 4.22–5.81)
RDW: 15 % (ref 11.5–15.5)
WBC: 5.4 K/uL (ref 4.0–10.5)

## 2014-07-15 LAB — HEPARIN LEVEL (UNFRACTIONATED): Heparin Unfractionated: 0.51 IU/mL (ref 0.30–0.70)

## 2014-07-15 LAB — HIV ANTIBODY (ROUTINE TESTING W REFLEX): HIV 1&2 Ab, 4th Generation: NONREACTIVE

## 2014-07-15 MED ORDER — RIVAROXABAN 15 MG PO TABS
15.0000 mg | ORAL_TABLET | Freq: Two times a day (BID) | ORAL | Status: DC
  Administered 2014-07-15 (×2): 15 mg via ORAL
  Filled 2014-07-15 (×4): qty 1

## 2014-07-15 MED ORDER — RIVAROXABAN (XARELTO) EDUCATION KIT FOR DVT/PE PATIENTS
PACK | Freq: Once | Status: AC
  Administered 2014-07-15: 09:00:00
  Filled 2014-07-15: qty 1

## 2014-07-15 MED ORDER — RIVAROXABAN (XARELTO) VTE STARTER PACK (15 & 20 MG): ORAL_TABLET | ORAL | Status: DC

## 2014-07-15 NOTE — Progress Notes (Signed)
  Echocardiogram 2D Echocardiogram has been performed.  Bobbye Charleston 07/15/2014, 11:07 AM

## 2014-07-15 NOTE — Progress Notes (Signed)
SATURATION QUALIFICATIONS: (This note is used to comply with regulatory documentation for home oxygen)  Patient Saturations on Room Air at Rest 96  Patient Saturations on Room Air while Ambulating 87  Patient Saturations on 2 Liters of oxygen while Ambulating 96  Please briefly explain why patient needs home oxygen: lung ca

## 2014-07-15 NOTE — Discharge Summary (Addendum)
Physician Discharge Summary  Daniel Vega QQV:[id number] DOB: Dec 07, 1940 DOA: 07/14/2014  PCP: Marjorie Smolder, MD  Admit date: 07/14/2014 Discharge date: 07/15/2014  Time spent: >45 minutes  Recommendations for Outpatient Follow-up:  1. Check pulse ox as outpt   Discharge Condition: stable Diet recommendation: heart heatlhy  Discharge Diagnoses:  Principal Problem:   Embolism, pulmonary with infarction Active Problems:   CAD (coronary artery disease)   Rheumatoid arthritis   History of present illness:  This is a 73 y.o. year old male with significant past medical history of LLL lung ca s/p lobectomy 1999, prostate ca s/p seeding 2009, hx/o DVT, CAD, HTN, rheumatoid arthritis presenting with recurrent dyspnea The patient was recently treated for dyspnea as outpt- it was suspected he had a CAP and was given a course of antibiotics. He presented to the hospital yesterday with the same complaints. He never had a cough but was quite short of breath mainly with exertion. He was found to have a RLL infiltrate on CXR and admitted for treatment for pneumonia. However, a CT of the chest obtained after admission reveals a R lower lobe PE resulting in infarction of the R Lower lung.   Hospital Course:  PE with right pulmonary infarct - started on a Heparin infusion soon after admission. Switched to [facility] today. He does have hypoxia with pulse ox dropping to 87% on exertion and therefore, he will be discharged home with O2.  - of note he recently was treated for a superficial venous thrombosis of the left lower leg a few wks ago.  The patient feels that this has improved however, a repeat Duplex performed today reveals that the thrombosis is extending from the greater saphenous vein at the level of confluence with the common femoral vein down to the ankle - he is advised to watch closely for bleeding - he is advised to f/u with PCP in a few weeks to re-assess need for O2.   All other medical  issues have remained stable- he is to continue ASA 81 mg which he takes for CAD.   Procedures: ECHO:  - LVEF 60-65%, severe concentric LVH, normal wall motion, diastolic dysfunction, indeterminate LV filling pressure, borderline pulmonary hypertension.   Duplex LE  See preliminary report above  Consultations:  none  Discharge Exam: Filed Weights   07/14/14 1329 07/14/14 2004  Weight: 87.091 kg (192 lb) 87 kg (191 lb 12.8 oz)   Filed Vitals:   07/15/14 1450  BP: 104/63  Pulse: 95  Temp: 98 F (36.7 C)  Resp: 26    General: AAO x 3, no distress Cardiovascular: RRR, no murmurs  Respiratory: clear to auscultation bilaterally GI: soft, non-tender, non-distended, bowel sound positive  Discharge Instructions You were cared for by a hospitalist during your hospital stay. If you have any questions about your discharge medications or the care you received while you were in the hospital after you are discharged, you can call the unit and asked to speak with the hospitalist on call if the hospitalist that took care of you is not available. Once you are discharged, your primary care physician will handle any further medical issues. Please note that NO REFILLS for any discharge medications will be authorized once you are discharged, as it is imperative that you return to your primary care physician (or establish a relationship with a primary care physician if you do not have one) for your aftercare needs so that they can reassess your need for medications and monitor your lab  values.     Medication List         acetaminophen 500 MG tablet  Commonly known as:  TYLENOL  Take 500 mg by mouth every 6 (six) hours as needed for moderate pain.     allopurinol 100 MG tablet  Commonly known as:  ZYLOPRIM  Take 100 mg by mouth Daily.     ALPRAZolam 0.5 MG tablet  Commonly known as:  XANAX  Take 0.5 mg by mouth 3 (three) times daily as needed for anxiety.     aspirin 81 MG tablet   Take 81 mg by mouth daily.     atorvastatin 40 MG tablet  Commonly known as:  LIPITOR  Take 40 mg by mouth every morning.     folic acid 1 MG tablet  Commonly known as:  FOLVITE  Take 1 mg by mouth daily.     glucosamine-chondroitin 500-400 MG tablet  Take 1 tablet by mouth 2 (two) times daily.     levofloxacin 500 MG tablet  Commonly known as:  LEVAQUIN  Take 1 tablet (500 mg total) by mouth daily.     lisinopril 10 MG tablet  Commonly known as:  PRINIVIL,ZESTRIL  Take 10 mg by mouth daily.     methotrexate 2.5 MG tablet  Commonly known as:  RHEUMATREX  Take 15 mg by mouth every Wednesday. Caution:Chemotherapy. Protect from light.     nitroGLYCERIN 0.4 MG SL tablet  Commonly known as:  NITROSTAT  Place 0.4 mg under the tongue every 5 (five) minutes as needed. For chest pain     predniSONE 5 MG tablet  Commonly known as:  DELTASONE  Take 5 mg by mouth daily with breakfast.     Rivaroxaban 15 & 20 MG Tbpk  Commonly known as:  XARELTO STARTER PACK  Take as directed on package: Start with one 15mg  tablet by mouth twice a day with food. On Day 22, switch to one 20mg  tablet once a day with food.     verapamil 240 MG CR tablet  Commonly known as:  CALAN-SR  Take 240 mg by mouth 2 (two) times daily.     VITAMIN B 12 PO  Take 1,000 mcg by mouth daily.       Allergies  Allergen Reactions  . Codeine Palpitations    Made heart hurt      The results of significant diagnostics from this hospitalization (including imaging, microbiology, ancillary and laboratory) are listed below for reference.    Significant Diagnostic Studies: Dg Chest 2 View  07/14/2014   CLINICAL DATA:  Low oxygen saturation.  EXAM: CHEST  2 VIEW  COMPARISON:  07/05/2014.  05/22/2014.  FINDINGS: Opacity at the right base is stable since the previous study but new since 05/22/2014. This may be related atelectasis although infection could have this appearance. Probable small associated right pleural  effusion. Left lung is clear. The cardiopericardial silhouette is within normal limits for size. Imaged bony structures of the thorax are intact.  IMPRESSION: Stable. Right base atelectasis or pneumonia with small right pleural effusion. Overall imaging features are stable in the 9 days since the prior study.   Electronically Signed   By: Misty Stanley M.D.   On: 07/14/2014 15:17   Dg Chest 2 View  07/05/2014   CLINICAL DATA:  Shortness of breath, chest pain and bilateral shoulder pain.  EXAM: CHEST  2 VIEW  COMPARISON:  Chest radiograph performed 05/22/2014  FINDINGS: The lungs are mildly hypoexpanded. Mild right basilar  opacity may reflect atelectasis or possibly mild pneumonia. There is no evidence of pleural effusion or pneumothorax.  The heart is normal in size; the mediastinal contour is within normal limits. No acute osseous abnormalities are seen.  IMPRESSION: Mild right basilar opacity may reflect atelectasis or possibly mild pneumonia. Lungs mildly hypoexpanded.   Electronically Signed   By: Garald Balding M.D.   On: 07/05/2014 05:55   Ct Angio Chest Pe W/cm &/or Wo Cm  07/14/2014   CLINICAL DATA:  Recurrent dyspnea.  Short of breath.  Lung cancer.  EXAM: CT ANGIOGRAPHY CHEST WITH CONTRAST  TECHNIQUE: Multidetector CT imaging of the chest was performed using the standard protocol during bolus administration of intravenous contrast. Multiplanar CT image reconstructions and MIPs were obtained to evaluate the vascular anatomy.  CONTRAST:  161mL OMNIPAQUE IOHEXOL 350 MG/ML SOLN  COMPARISON:  Chest radiograph 07/10/2014.  FINDINGS: Bones: No aggressive osseous lesions. Thoracic spine DISH. Exaggerated thoracic kyphosis.  Cardiovascular: Acute pulmonary embolus is present in the RIGHT lower lobe. No evidence of RIGHT heart strain. Aortic atherosclerosis without an acute aortic abnormality. Coronary artery atherosclerosis is present. If office based assessment of coronary risk factors has not been  performed, it is now recommended.  Lungs: Airspace disease in the RIGHT lower lobe following the distribution of the pulmonary embolus compatible with pulmonary infarction. LEFT upper lobectomy. 9 mm solid and ground-glass attenuation nodule in the hyper expanded LEFT lower lobe (image 25 series 6).  Central airways: Patent.  Effusions: Small RIGHT pleural effusion layering dependently. Small pericardial effusion. No LEFT pleural effusion.  Lymphadenopathy: No axillary adenopathy. No mediastinal or hilar adenopathy.  Esophagus: Small hiatal hernia and patulous gastroesophageal junction.  Upper abdomen: Within normal limits.  Other: None.  Review of the MIP images confirms the above findings.  IMPRESSION: 1. Acute RIGHT lower lobe pulmonary embolus with RIGHT lower lobe pulmonary infarct. 2. 9 mm ground-glass attenuation nodule with smaller 5 mm solid component in the expanded LEFT lower lobe following LEFT upper lobectomy. This nodule will require followup. Three-month noncontrast chest CT recommended to assess for persistence. 3. Critical Value/emergent results were called by telephone at the time of interpretation on 07/14/2014 at 8:23 pm to Dr. Shanda Howells , who verbally acknowledged these results.   Electronically Signed   By: Dereck Ligas M.D.   On: 07/14/2014 20:24    Microbiology: No results found for this or any previous visit (from the past 240 hour(s)).   Labs: Basic Metabolic Panel:  Recent Labs Lab 07/14/14 1341 07/14/14 1901  NA 136*  --   K 4.8  --   CL 101  --   CO2 23  --   GLUCOSE 149*  --   BUN 25*  --   CREATININE 1.10 1.24  CALCIUM 9.5  --    Liver Function Tests: No results found for this basename: AST, ALT, ALKPHOS, BILITOT, PROT, ALBUMIN,  in the last 168 hours No results found for this basename: LIPASE, AMYLASE,  in the last 168 hours No results found for this basename: AMMONIA,  in the last 168 hours CBC:  Recent Labs Lab 07/14/14 1341 07/14/14 1901  07/15/14 0705  WBC 7.1 7.0 5.4  NEUTROABS  --   --  3.7  HGB 12.2* 12.3* 10.5*  HCT 36.4* 36.4* 31.1*  MCV 109.3* 108.7* 108.7*  PLT 336 324 279   Cardiac Enzymes: No results found for this basename: CKTOTAL, CKMB, CKMBINDEX, TROPONINI,  in the last 168 hours BNP: BNP (last  3 results)  Recent Labs  07/05/14 0506  PROBNP 116.1   CBG: No results found for this basename: GLUCAP,  in the last 168 hours     Signed:  Debbe Odea, MD [facility] 07/15/2014, 3:54 PM

## 2014-07-15 NOTE — Discharge Instructions (Signed)
Information on my medicine - XARELTO (rivaroxaban)  This medication education was reviewed with me or my healthcare representative as part of my discharge preparation.  The pharmacist that spoke with me during my hospital stay was:  [facility], Margot Chimes, [place]? Xarelto was prescribed to treat blood clots that may have been found in the veins of your legs (deep vein thrombosis) or in your lungs (pulmonary embolism) and to reduce the risk of them occurring again.  What do you need to know about Xarelto? The starting dose is one 15 mg tablet taken TWICE daily with food for the FIRST 21 DAYS then on 08/05/14  the dose is changed to one 20 mg tablet taken ONCE A DAY with your evening meal.  DO NOT stop taking Xarelto without talking to the health care provider who prescribed the medication.  Refill your prescription for 20 mg tablets before you run out.  After discharge, you should have regular check-up appointments with your healthcare provider that is prescribing your Xarelto.  In the future your dose may need to be changed if your kidney function changes by a significant amount.  What do you do if you miss a dose? If you are taking Xarelto TWICE DAILY and you miss a dose, take it as soon as you remember. You may take two 15 mg tablets (total 30 mg) at the same time then resume your regularly scheduled 15 mg twice daily the next day.  If you are taking Xarelto ONCE DAILY and you miss a dose, take it as soon as you remember on the same day then continue your regularly scheduled once daily regimen the next day. Do not take two doses of Xarelto at the same time.   Important Safety Information Xarelto is a blood thinner medicine that can cause bleeding. You should call your healthcare provider right away if you experience any of the following:   Bleeding from an injury or your nose that does not stop.   Unusual colored urine (red or dark brown) or  unusual colored stools (red or black).   Unusual bruising for unknown reasons.   A serious fall or if you hit your head (even if there is no bleeding).  Some medicines may interact with Xarelto and might increase your risk of bleeding while on Xarelto. To help avoid this, consult your healthcare provider or pharmacist prior to using any new prescription or non-prescription medications, including herbals, vitamins, non-steroidal anti-inflammatory drugs (NSAIDs) and supplements.  This website has more information on Xarelto: [email or web address]-benson.[email or web address]

## 2014-07-15 NOTE — Progress Notes (Signed)
VASCULAR LAB PRELIMINARY  PRELIMINARY  PRELIMINARY  PRELIMINARY  Bilateral lower extremity venous duplex  completed.    Preliminary report:  Bilateral:  No evidence of DVT.  Left:  Superficial thrombosis noted in the greater saphenous vein from the confluence with the common femoral vein to the ankle.  This is a progression of thrombosis.  The prior study of 05-15-14 noted superficial thrombosis in the GSV from the distal femoral vein to the ankle.       Raimi Guillermo, RVT 07/15/2014, 3:41 PM

## 2014-07-15 NOTE — Progress Notes (Signed)
Pt 02 SAT 96% on RA, pt ambulated hall 02 SAT range from 90 to 87% pt returned to 96% after sitting back down in room. No c/o sob or cp. Tolerated well.

## 2014-07-15 NOTE — Progress Notes (Signed)
Nutrition Brief Note  Patient identified on the Malnutrition Screening Tool (MST) Report  Wt Readings from Last 15 Encounters:  07/14/14 191 lb 12.8 oz (87 kg)  10/25/13 197 lb 1.6 oz (89.404 kg)  01/24/13 188 lb 14.4 oz (85.684 kg)  08/07/12 177 lb 8 oz (80.513 kg)  07/24/12 177 lb 14.4 oz (80.695 kg)  02/11/12 182 lb 15.7 oz (83 kg)  12/14/11 178 lb 9.6 oz (81.012 kg)  09/06/11 174 lb 1.6 oz (78.971 kg)  07/19/11 178 lb 6 oz (80.91 kg)  02/09/11 172 lb (78.019 kg)  01/31/11 168 lb 9.6 oz (76.476 kg)   This is a 73 y.o. year old male with significant past medical history of LLL lung ca s/p lobectomy 1999, prostate ca s/p seeding 2009, hx/o DVT, CAD, HTN, rheumatoid arthritis presenting with recurrent dyspnea, HCAP.  Pt reports poor appetite and weight loss PTA due to sickness. Wife reports portions were decreased and pt could only eat a small bowl of food at a time. Pt claims he lost 15# in the past 2 weeks, due to illness ("I think it was the pneumonia"). He reports UBW of 180#. Pt is currently above UBW.  He reports his appetite has come back and he now can "eat like a horse". He reports he is being discharged today. Discussed importance of good PO intake to support healing process. Pt and wife have no further nutrition wants or needs at this time. Very grateful for visit.   Body mass index is 30.97 kg/(m^2). Patient meets criteria for obesity, class I based on current BMI.   Current diet order is [employer], patient is consuming approximately 100% of meals at this time. Labs and medications reviewed.   No nutrition interventions warranted at this time. If nutrition issues arise, please consult RD.   Sharlot Sturkey A. Jimmye Norman, RD, LDN Pager: [phone] After hours Pager: [phone]

## 2014-07-15 NOTE — Progress Notes (Signed)
UR completed 

## 2014-07-15 NOTE — Progress Notes (Signed)
NURSING PROGRESS NOTE  Daniel Vega [id number] Discharge Data: 07/15/2014 6:03 PM Attending Provider: Debbe Odea, MD KDT:OIZTI,WPYKD RUTH, MD     [name] to be D/C'd Home per MD order.  Discussed with the patient the After Visit Summary and all questions fully answered. All IV's discontinued with no bleeding noted. All belongings returned to patient for patient to take home.   Last Vital Signs:  Blood pressure 104/63, pulse 95, temperature 98 F (36.7 C), temperature source Oral, resp. rate 26, height 5\' 6"  (1.676 m), weight 87 kg (191 lb 12.8 oz), SpO2 98.00%.  Discharge Medication List   Medication List         acetaminophen 500 MG tablet  Commonly known as:  TYLENOL  Take 500 mg by mouth every 6 (six) hours as needed for moderate pain.     allopurinol 100 MG tablet  Commonly known as:  ZYLOPRIM  Take 100 mg by mouth Daily.     ALPRAZolam 0.5 MG tablet  Commonly known as:  XANAX  Take 0.5 mg by mouth 3 (three) times daily as needed for anxiety.     aspirin 81 MG tablet  Take 81 mg by mouth daily.     atorvastatin 40 MG tablet  Commonly known as:  LIPITOR  Take 40 mg by mouth every morning.     folic acid 1 MG tablet  Commonly known as:  FOLVITE  Take 1 mg by mouth daily.     glucosamine-chondroitin 500-400 MG tablet  Take 1 tablet by mouth 2 (two) times daily.     levofloxacin 500 MG tablet  Commonly known as:  LEVAQUIN  Take 1 tablet (500 mg total) by mouth daily.     lisinopril 10 MG tablet  Commonly known as:  PRINIVIL,ZESTRIL  Take 10 mg by mouth daily.     methotrexate 2.5 MG tablet  Commonly known as:  RHEUMATREX  Take 15 mg by mouth every Wednesday. Caution:Chemotherapy. Protect from light.     nitroGLYCERIN 0.4 MG SL tablet  Commonly known as:  NITROSTAT  Place 0.4 mg under the tongue every 5 (five) minutes as needed. For chest pain     predniSONE 5 MG tablet  Commonly known as:  DELTASONE  Take 5 mg by mouth daily with breakfast.     Rivaroxaban 15 & 20 MG Tbpk  Commonly known as:  XARELTO STARTER PACK  Take as directed on package: Start with one 15mg  tablet by mouth twice a day with food. On Day 22, switch to one 20mg  tablet once a day with food.     verapamil 240 MG CR tablet  Commonly known as:  CALAN-SR  Take 240 mg by mouth 2 (two) times daily.     VITAMIN B 12 PO  Take 1,000 mcg by mouth daily.

## 2014-07-16 NOTE — Care Management Note (Signed)
    Page 1 of 1   07/16/2014     10:12:55 AM CARE MANAGEMENT NOTE 07/16/2014  Patient:  Daniel Vega, Daniel Vega   Account Number:  [id number]  Date Initiated:  07/15/2014  Documentation initiated by:  Tomi Bamberger  Subjective/Objective Assessment:   dx pna  admit- from home.     Action/Plan:   Anticipated DC Date:  07/15/2014   Anticipated DC Plan:  HOME/SELF CARE      DC Planning Services  CM consult      PAC Choice  DURABLE MEDICAL EQUIPMENT   Choice offered to / List presented to:  C-1 Patient   DME arranged  OXYGEN      DME agency  [place].        Status of service:  Completed, signed off Medicare Important Message given?  NO (If response is "NO", the following Medicare IM given date fields will be blank) Date Medicare IM given:   Medicare IM given by:   Date Additional Medicare IM given:   Additional Medicare IM given by:    Discharge Disposition:  HOME/SELF CARE  Per UR Regulation:  Reviewed for med. necessity/level of care/duration of stay  If discussed at [place] of Stay Meetings, dates discussed:    Comments:  07/14/14 [place], BSN [phone] NCM was notified that patient needs home oxygen, NCM spoke with patient and spouse, they would like to work with [facility] for home oxygen.  Referral given to [facility] with [facility], patient  received oxygen tank in room and [facility] wil set up at home.  Patient for dc.

## 2014-07-16 NOTE — ED Provider Notes (Signed)
Medical screening examination/treatment/procedure(s) were performed by non-physician practitioner and as supervising physician I was immediately available for consultation/collaboration.   EKG Interpretation   Date/Time:  Monday July 14 2014 13:29:26 EDT Ventricular Rate:  104 PR Interval:  154 QRS Duration: 94 QT Interval:  338 QTC Calculation: 444 R Axis:   -6 Text Interpretation:  Sinus tachycardia Otherwise normal ECG ED PHYSICIAN  INTERPRETATION AVAILABLE IN [facility] Confirmed by TEST, Record  ([id number]) on 07/16/2014 7:06:57 AM       Babette Relic, MD 07/16/14 1335

## 2014-07-17 DIAGNOSIS — Z8709 Personal history of other diseases of the respiratory system: Secondary | ICD-10-CM

## 2014-07-17 HISTORY — DX: Personal history of other diseases of the respiratory system: Z87.09

## 2014-07-18 ENCOUNTER — Emergency Department (HOSPITAL_COMMUNITY)
Admission: EM | Admit: 2014-07-18 | Discharge: 2014-07-18 | Disposition: A | Discharge status: Home / Self Care | Payer: Medicare Other | Attending: Emergency Medicine | Admitting: Emergency Medicine

## 2014-07-18 ENCOUNTER — Encounter (HOSPITAL_COMMUNITY): Payer: Self-pay | Admitting: Emergency Medicine

## 2014-07-18 DIAGNOSIS — I10 Essential (primary) hypertension: Secondary | ICD-10-CM | POA: Insufficient documentation

## 2014-07-18 DIAGNOSIS — N390 Urinary tract infection, site not specified: Secondary | ICD-10-CM | POA: Diagnosis not present

## 2014-07-18 DIAGNOSIS — Z85118 Personal history of other malignant neoplasm of bronchus and lung: Secondary | ICD-10-CM | POA: Diagnosis not present

## 2014-07-18 DIAGNOSIS — Z87891 Personal history of nicotine dependence: Secondary | ICD-10-CM | POA: Insufficient documentation

## 2014-07-18 DIAGNOSIS — E780 Pure hypercholesterolemia: Secondary | ICD-10-CM | POA: Insufficient documentation

## 2014-07-18 DIAGNOSIS — Z7901 Long term (current) use of anticoagulants: Secondary | ICD-10-CM | POA: Insufficient documentation

## 2014-07-18 DIAGNOSIS — Z7982 Long term (current) use of aspirin: Secondary | ICD-10-CM | POA: Diagnosis not present

## 2014-07-18 DIAGNOSIS — B9689 Other specified bacterial agents as the cause of diseases classified elsewhere: Secondary | ICD-10-CM | POA: Diagnosis not present

## 2014-07-18 DIAGNOSIS — I2510 Atherosclerotic heart disease of native coronary artery without angina pectoris: Secondary | ICD-10-CM | POA: Insufficient documentation

## 2014-07-18 DIAGNOSIS — Z79899 Other long term (current) drug therapy: Secondary | ICD-10-CM | POA: Diagnosis not present

## 2014-07-18 DIAGNOSIS — Z8546 Personal history of malignant neoplasm of prostate: Secondary | ICD-10-CM | POA: Diagnosis not present

## 2014-07-18 DIAGNOSIS — R319 Hematuria, unspecified: Secondary | ICD-10-CM | POA: Insufficient documentation

## 2014-07-18 DIAGNOSIS — Z87442 Personal history of urinary calculi: Secondary | ICD-10-CM | POA: Diagnosis not present

## 2014-07-18 DIAGNOSIS — Z9889 Other specified postprocedural states: Secondary | ICD-10-CM | POA: Diagnosis not present

## 2014-07-18 DIAGNOSIS — Z86010 Personal history of colonic polyps: Secondary | ICD-10-CM | POA: Insufficient documentation

## 2014-07-18 DIAGNOSIS — Z7952 Long term (current) use of systemic steroids: Secondary | ICD-10-CM | POA: Insufficient documentation

## 2014-07-18 LAB — URINALYSIS, ROUTINE W REFLEX MICROSCOPIC
GLUCOSE, UA: 100 mg/dL — AB
KETONES UR: 40 mg/dL — AB
Nitrite: POSITIVE — AB
PH: 5 (ref 5.0–8.0)
Protein, ur: >300 mg/dL — AB
Specific Gravity, Urine: 1.018 (ref 1.005–1.030)
Urobilinogen, UA: 4 mg/dL — ABNORMAL HIGH (ref 0.0–1.0)

## 2014-07-18 LAB — COMPREHENSIVE METABOLIC PANEL
ALK PHOS: 62 U/L (ref 39–117)
ALT: 25 U/L (ref 0–53)
AST: 24 U/L (ref 0–37)
Albumin: 2.9 g/dL — ABNORMAL LOW (ref 3.5–5.2)
Anion gap: 11 (ref 5–15)
BILIRUBIN TOTAL: 0.4 mg/dL (ref 0.3–1.2)
BUN: 16 mg/dL (ref 6–23)
CALCIUM: 8.8 mg/dL (ref 8.4–10.5)
CO2: 24 meq/L (ref 19–32)
CREATININE: 1.01 mg/dL (ref 0.50–1.35)
Chloride: 100 mEq/L (ref 96–112)
GFR calc Af Amer: 84 mL/min — ABNORMAL LOW (ref >90)
GFR calc non Af Amer: 72 mL/min — ABNORMAL LOW (ref >90)
GLUCOSE: 113 mg/dL — AB (ref 70–99)
Potassium: 4.3 mEq/L (ref 3.7–5.3)
SODIUM: 135 meq/L — AB (ref 137–147)
TOTAL PROTEIN: 7.3 g/dL (ref 6.0–8.3)

## 2014-07-18 LAB — CBC WITH DIFFERENTIAL/PLATELET
Basophils Absolute: 0 10*3/uL (ref 0.0–0.1)
Basophils Relative: 1 % (ref 0–1)
EOS ABS: 0.1 10*3/uL (ref 0.0–0.7)
EOS PCT: 2 % (ref 0–5)
HEMATOCRIT: 33.3 % — AB (ref 39.0–52.0)
Hemoglobin: 11.3 g/dL — ABNORMAL LOW (ref 13.0–17.0)
LYMPHS ABS: 1.7 10*3/uL (ref 0.7–4.0)
LYMPHS PCT: 22 % (ref 12–46)
MCH: 36.5 pg — AB (ref 26.0–34.0)
MCHC: 33.9 g/dL (ref 30.0–36.0)
MCV: 107.4 fL — AB (ref 78.0–100.0)
MONO ABS: 0.8 10*3/uL (ref 0.1–1.0)
MONOS PCT: 10 % (ref 3–12)
Neutro Abs: 5.2 10*3/uL (ref 1.7–7.7)
Neutrophils Relative %: 67 % (ref 43–77)
PLATELETS: 350 10*3/uL (ref 150–400)
RBC: 3.1 MIL/uL — AB (ref 4.22–5.81)
RDW: 14.7 % (ref 11.5–15.5)
WBC: 7.8 10*3/uL (ref 4.0–10.5)

## 2014-07-18 LAB — URINE MICROSCOPIC-ADD ON

## 2014-07-18 MED ORDER — SULFAMETHOXAZOLE-TMP DS 800-160 MG PO TABS
1.0000 | ORAL_TABLET | Freq: Once | ORAL | Status: AC
  Administered 2014-07-18: 1 via ORAL
  Filled 2014-07-18: qty 1

## 2014-07-18 MED ORDER — SULFAMETHOXAZOLE-TMP DS 800-160 MG PO TABS: 1.0000 | ORAL_TABLET | Freq: Two times a day (BID) | ORAL | Status: AC

## 2014-07-18 NOTE — ED Provider Notes (Signed)
CSN: [id number]     Arrival date & time 07/18/14  62 History   First MD Initiated Contact with Patient 07/18/14 1729     Chief Complaint  Patient presents with  . Hematuria   Patient is a 73 y.o. male presenting with hematuria. The history is provided by the patient.  Hematuria This is a new problem. The current episode started in the past 7 days. The problem occurs constantly. The problem has been unchanged. Pertinent negatives include no abdominal pain, chest pain, chills, coughing, fever, headaches, nausea, neck pain, rash, sore throat or vomiting. Nothing aggravates the symptoms. He has tried nothing for the symptoms.   Patient is a 73 year old male with a history of hypertension, hypercholesterolemia, kidney stones, CAD, prostate cancer status post seeding food is also on Zaroxolyn who presents with hematuria. Patient states he was recently admitted for pneumonia and also got to have a PE at that time. The patient was discharged on xerelto. On the day of discharge the patient noticed hematuria. He denies dysuria, abdominal pain, nausea, vomiting, fevers, chills, night sweats. The patient states that today his urine has been bright red each time he does to the bathroom. Patient has not had any recent instrumentation or trauma of his genitals. The patient's wife states that she spoke with the patient's primary provider today to instructed the patient to stop taking xerelto to today and will see the patient in clinic next week.  Past Medical History  Diagnosis Date  . Hypertension   . Hypercholesteremia   . Glucose intolerance (impaired glucose tolerance)   . Kidney stones, calcium oxalate   . CAD (coronary artery disease)   . 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 Adenomatous colon polyp     Dr. Amedeo Plenty  . Prostate cancer 2009    Dr. Eulogio Ditch; tx w/ radium implants  . Lung cancer    Past Surgical History  Procedure Laterality Date  . Lobectomy  1999    left - Dr. Cyndia Bent  .  Repair right testicular torsion  1962  . Cardiac catheterization  1997    Dr. Martinique  . Prostate radium implants  2009  . Lung cancer surgery  2003  . Colonoscopy  ;11/12/99;04/29/03    12/27/09  . Lumbar laminectomy/decompression microdiscectomy  08/10/2012    Procedure: LUMBAR LAMINECTOMY/DECOMPRESSION MICRODISCECTOMY 1 LEVEL;  Surgeon: Winfield Cunas, MD;  Location: [facility];  Service: Neurosurgery;  Laterality: Left;  LEFT Lumbar five-sacral one microdiskectomy   Family History  Problem Relation Age of Onset  . Heart disease Mother   . Hypertension Mother   . Heart disease Father   . Hypertension Father   . Stroke Father   . Other Brother     spinal meningitis  . Other Brother     MVA  . Cancer Sister     stomach   History  Substance Use Topics  . Smoking status: Former Smoker -- 2.00 packs/day for 35 years    Types: Cigarettes    Quit date: 10/17/1982  . Smokeless tobacco: Former Systems developer    Types: Chew    Quit date: 03/15/2012  . Alcohol Use: 7.0 oz/week    14 drink(s) per week     Comment: 2 drinks a day before supper but has not had in 1 month    Review of Systems  Constitutional: Negative for fever and chills.  HENT: Negative for rhinorrhea and sore throat.   Eyes: Negative for visual disturbance.  Respiratory: Negative for cough and shortness of breath.   Cardiovascular: Negative for chest pain.  Gastrointestinal: Negative for nausea, vomiting, abdominal pain, diarrhea and constipation.  Genitourinary: Positive for hematuria. Negative for dysuria, decreased urine volume, scrotal swelling, difficulty urinating and testicular pain.  Musculoskeletal: Negative for back pain and neck pain.  Skin: Negative for rash.  Neurological: Negative for syncope and headaches.  Psychiatric/Behavioral: Negative for confusion.  All other systems reviewed and are negative.  Allergies  Codeine  Home Medications   Prior to Admission medications   Medication Sig Start Date  End Date Taking? Authorizing Provider  acetaminophen (TYLENOL) 500 MG tablet Take 500 mg by mouth every 6 (six) hours as needed for moderate pain.   Yes Historical Provider, MD  allopurinol (ZYLOPRIM) 100 MG tablet Take 100 mg by mouth Daily. 07/23/12  Yes Historical Provider, MD  ALPRAZolam Duanne Moron) 0.5 MG tablet Take 0.5 mg by mouth 3 (three) times daily as needed for anxiety.    Yes Historical Provider, MD  aspirin 81 MG tablet Take 81 mg by mouth daily.   Yes Historical Provider, MD  atorvastatin (LIPITOR) 40 MG tablet Take 40 mg by mouth every morning.    Yes Historical Provider, MD  Cyanocobalamin (VITAMIN B 12 PO) Take 1,000 mcg by mouth daily.     Yes Historical Provider, MD  folic acid (FOLVITE) 1 MG tablet Take 1 mg by mouth daily.   Yes Historical Provider, MD  glucosamine-chondroitin 500-400 MG tablet Take 1 tablet by mouth 2 (two) times daily.    Yes Historical Provider, MD  lisinopril (PRINIVIL,ZESTRIL) 10 MG tablet Take 10 mg by mouth daily.   Yes Historical Provider, MD  nitroGLYCERIN (NITROSTAT) 0.4 MG SL tablet Place 0.4 mg under the tongue every 5 (five) minutes as needed. For chest pain   Yes Historical Provider, MD  predniSONE (DELTASONE) 5 MG tablet Take 5 mg by mouth daily with breakfast.   Yes Historical Provider, MD  Rivaroxaban (XARELTO STARTER PACK PO) Take by mouth.   Yes Historical Provider, MD  rivaroxaban (XARELTO) 20 MG TABS tablet Take 20 mg by mouth daily.    Yes Historical Provider, MD  verapamil (CALAN-SR) 240 MG CR tablet Take 240 mg by mouth 2 (two) times daily.     Yes Historical Provider, MD  levofloxacin (LEVAQUIN) 500 MG tablet Take 500 mg by mouth daily. x7 days    Historical Provider, MD  methotrexate (RHEUMATREX) 2.5 MG tablet Take 15 mg by mouth every Wednesday. Caution:Chemotherapy. Protect from light.    Historical Provider, MD  sulfamethoxazole-trimethoprim (BACTRIM DS) 800-160 MG per tablet Take 1 tablet by mouth 2 (two) times daily. 07/18/14 07/21/14   Gustavus Bryant, MD   BP 119/71  Pulse 76  Temp(Src) 97.7 F (36.5 C) (Oral)  Resp 20  SpO2 100% Physical Exam  Constitutional: He is oriented to person, place, and time. He appears well-developed and well-nourished. No distress.  HENT:  Head: Normocephalic and atraumatic.  Mouth/Throat: Oropharynx is clear and moist.  Eyes: EOM are normal.  Neck: Neck supple. No JVD present.  Cardiovascular: Normal rate, regular rhythm, normal heart sounds and intact distal pulses.   Pulmonary/Chest: Effort normal and breath sounds normal.  Abdominal: Soft. He exhibits no distension. There is no tenderness.  Musculoskeletal: Normal range of motion. He exhibits no edema.  Neurological: He is alert and oriented to person, place, and time. No cranial nerve deficit.  Skin: Skin is warm and dry.  Psychiatric: His behavior is normal.  ED Course  Procedures  None   Labs Review Labs Reviewed  CBC WITH DIFFERENTIAL - Abnormal; Notable for the following:    RBC 3.10 (*)    Hemoglobin 11.3 (*)    HCT 33.3 (*)    MCV 107.4 (*)    MCH 36.5 (*)    All other components within normal limits  COMPREHENSIVE METABOLIC PANEL - Abnormal; Notable for the following:    Sodium 135 (*)    Glucose, Bld 113 (*)    Albumin 2.9 (*)    GFR calc non Af Amer 72 (*)    GFR calc Af Amer 84 (*)    All other components within normal limits  URINALYSIS, ROUTINE W REFLEX MICROSCOPIC - Abnormal; Notable for the following:    Color, Urine RED (*)    APPearance TURBID (*)    Glucose, UA 100 (*)    Hgb urine dipstick LARGE (*)    Bilirubin Urine LARGE (*)    Ketones, ur 40 (*)    Protein, ur >300 (*)    Urobilinogen, UA 4.0 (*)    Nitrite POSITIVE (*)    Leukocytes, UA LARGE (*)    All other components within normal limits  URINE MICROSCOPIC-ADD ON - Abnormal; Notable for the following:    Squamous Epithelial / LPF FEW (*)    Bacteria, UA MANY (*)    All other components within normal limits    MDM   Final  diagnoses:  UTI (lower urinary tract infection)  Hematuria   73 year old man presents with hematuria while being on anticoagulation. The patient is well appearing on exam. He is afebrile, VSS. Abdomen is soft, nontender, nondistended. No CVA tenderness. Hemoglobin is 11.3. His urine has gross hematuria and also appears to be infected. Patient will be prescribed Bactrim for UTI. First dose given in the ED. Feel patient is stable for discharge home with close follow up with his PCP. The patient is agreeable and voices understanding of this plan.  Case discussed with Dr. Mingo Amber.  Gustavus Bryant, MD  Gustavus Bryant, MD 07/19/14 [phone]

## 2014-07-18 NOTE — ED Notes (Signed)
MD at bedside. 

## 2014-07-18 NOTE — ED Notes (Signed)
Family at bedside. 

## 2014-07-18 NOTE — ED Notes (Addendum)
Pt reports hematuria x 4 days, states "it looks just like pure blood". States he was recently admitted for PE's and started on Xarelto, discharged on 10/29 . PT concerned about blood loss, reports increase fatigue. Denies dysuria. Pt denies SOB or CP. NAD.

## 2014-07-19 NOTE — ED Provider Notes (Signed)
I saw and evaluated the patient, reviewed the resident's note and I agree with the findings and plan.   EKG Interpretation None      Patient here with hematuria. Recent admission for PE on xarelto. PCP recommended to cease Xarelto and will f/u early next week. UA here shows infection. Given antibiotics. Stable for discharge. No anemia.  Evelina Bucy, MD 07/19/14 [phone]

## 2014-07-21 LAB — CULTURE, BLOOD (ROUTINE X 2)
Culture: NO GROWTH
Culture: NO GROWTH

## 2014-07-24 DIAGNOSIS — I10 Essential (primary) hypertension: Secondary | ICD-10-CM | POA: Diagnosis not present

## 2014-07-24 DIAGNOSIS — I2699 Other pulmonary embolism without acute cor pulmonale: Secondary | ICD-10-CM | POA: Diagnosis not present

## 2014-07-24 DIAGNOSIS — I700 Atherosclerosis of aorta: Secondary | ICD-10-CM | POA: Diagnosis not present

## 2014-07-24 DIAGNOSIS — D649 Anemia, unspecified: Secondary | ICD-10-CM | POA: Diagnosis not present

## 2014-07-24 DIAGNOSIS — R319 Hematuria, unspecified: Secondary | ICD-10-CM | POA: Diagnosis not present

## 2014-07-24 DIAGNOSIS — R911 Solitary pulmonary nodule: Secondary | ICD-10-CM | POA: Diagnosis not present

## 2014-07-26 ENCOUNTER — Emergency Department (HOSPITAL_COMMUNITY): Payer: Medicare Other

## 2014-07-26 ENCOUNTER — Encounter (HOSPITAL_COMMUNITY): Payer: Self-pay | Admitting: Emergency Medicine

## 2014-07-26 ENCOUNTER — Inpatient Hospital Stay (HOSPITAL_COMMUNITY)
Admission: EM | Admit: 2014-07-26 | Discharge: 2014-08-05 | DRG: 802 | Disposition: A | Discharge status: Home Health | Payer: Medicare Other | Attending: Internal Medicine | Admitting: Internal Medicine

## 2014-07-26 DIAGNOSIS — R634 Abnormal weight loss: Secondary | ICD-10-CM | POA: Diagnosis present

## 2014-07-26 DIAGNOSIS — Z902 Acquired absence of lung [part of]: Secondary | ICD-10-CM | POA: Diagnosis present

## 2014-07-26 DIAGNOSIS — R109 Unspecified abdominal pain: Secondary | ICD-10-CM | POA: Diagnosis not present

## 2014-07-26 DIAGNOSIS — Z923 Personal history of irradiation: Secondary | ICD-10-CM | POA: Diagnosis not present

## 2014-07-26 DIAGNOSIS — N200 Calculus of kidney: Secondary | ICD-10-CM

## 2014-07-26 DIAGNOSIS — D62 Acute posthemorrhagic anemia: Secondary | ICD-10-CM | POA: Diagnosis present

## 2014-07-26 DIAGNOSIS — D126 Benign neoplasm of colon, unspecified: Secondary | ICD-10-CM

## 2014-07-26 DIAGNOSIS — R319 Hematuria, unspecified: Secondary | ICD-10-CM

## 2014-07-26 DIAGNOSIS — C3490 Malignant neoplasm of unspecified part of unspecified bronchus or lung: Secondary | ICD-10-CM | POA: Diagnosis not present

## 2014-07-26 DIAGNOSIS — J90 Pleural effusion, not elsewhere classified: Secondary | ICD-10-CM | POA: Diagnosis present

## 2014-07-26 DIAGNOSIS — Z86711 Personal history of pulmonary embolism: Secondary | ICD-10-CM

## 2014-07-26 DIAGNOSIS — I2510 Atherosclerotic heart disease of native coronary artery without angina pectoris: Secondary | ICD-10-CM | POA: Diagnosis present

## 2014-07-26 DIAGNOSIS — N029 Recurrent and persistent hematuria with unspecified morphologic changes: Secondary | ICD-10-CM | POA: Diagnosis present

## 2014-07-26 DIAGNOSIS — C61 Malignant neoplasm of prostate: Secondary | ICD-10-CM | POA: Diagnosis not present

## 2014-07-26 DIAGNOSIS — E43 Unspecified severe protein-calorie malnutrition: Secondary | ICD-10-CM | POA: Diagnosis present

## 2014-07-26 DIAGNOSIS — N3289 Other specified disorders of bladder: Secondary | ICD-10-CM | POA: Diagnosis not present

## 2014-07-26 DIAGNOSIS — Z87442 Personal history of urinary calculi: Secondary | ICD-10-CM

## 2014-07-26 DIAGNOSIS — N369 Urethral disorder, unspecified: Secondary | ICD-10-CM | POA: Diagnosis not present

## 2014-07-26 DIAGNOSIS — E7800 Pure hypercholesterolemia, unspecified: Secondary | ICD-10-CM

## 2014-07-26 DIAGNOSIS — J9811 Atelectasis: Secondary | ICD-10-CM | POA: Diagnosis not present

## 2014-07-26 DIAGNOSIS — M069 Rheumatoid arthritis, unspecified: Secondary | ICD-10-CM | POA: Diagnosis present

## 2014-07-26 DIAGNOSIS — Z6830 Body mass index (BMI) 30.0-30.9, adult: Secondary | ICD-10-CM | POA: Diagnosis not present

## 2014-07-26 DIAGNOSIS — N2889 Other specified disorders of kidney and ureter: Secondary | ICD-10-CM | POA: Diagnosis not present

## 2014-07-26 DIAGNOSIS — Z7952 Long term (current) use of systemic steroids: Secondary | ICD-10-CM | POA: Diagnosis not present

## 2014-07-26 DIAGNOSIS — Z7901 Long term (current) use of anticoagulants: Secondary | ICD-10-CM

## 2014-07-26 DIAGNOSIS — Z86718 Personal history of other venous thrombosis and embolism: Secondary | ICD-10-CM

## 2014-07-26 DIAGNOSIS — N3941 Urge incontinence: Secondary | ICD-10-CM | POA: Diagnosis present

## 2014-07-26 DIAGNOSIS — R1031 Right lower quadrant pain: Secondary | ICD-10-CM | POA: Diagnosis not present

## 2014-07-26 DIAGNOSIS — Z9889 Other specified postprocedural states: Secondary | ICD-10-CM

## 2014-07-26 DIAGNOSIS — N1339 Other hydronephrosis: Secondary | ICD-10-CM

## 2014-07-26 DIAGNOSIS — I10 Essential (primary) hypertension: Secondary | ICD-10-CM | POA: Diagnosis present

## 2014-07-26 DIAGNOSIS — M5126 Other intervertebral disc displacement, lumbar region: Secondary | ICD-10-CM

## 2014-07-26 DIAGNOSIS — N179 Acute kidney failure, unspecified: Secondary | ICD-10-CM | POA: Diagnosis present

## 2014-07-26 DIAGNOSIS — Z8546 Personal history of malignant neoplasm of prostate: Secondary | ICD-10-CM

## 2014-07-26 DIAGNOSIS — Z86010 Personal history of colonic polyps: Secondary | ICD-10-CM | POA: Diagnosis not present

## 2014-07-26 DIAGNOSIS — N131 Hydronephrosis with ureteral stricture, not elsewhere classified: Secondary | ICD-10-CM | POA: Diagnosis present

## 2014-07-26 DIAGNOSIS — E669 Obesity, unspecified: Secondary | ICD-10-CM | POA: Diagnosis present

## 2014-07-26 DIAGNOSIS — R7302 Impaired glucose tolerance (oral): Secondary | ICD-10-CM

## 2014-07-26 DIAGNOSIS — N1330 Unspecified hydronephrosis: Secondary | ICD-10-CM | POA: Diagnosis not present

## 2014-07-26 DIAGNOSIS — I82812 Embolism and thrombosis of superficial veins of left lower extremities: Secondary | ICD-10-CM | POA: Diagnosis present

## 2014-07-26 DIAGNOSIS — N289 Disorder of kidney and ureter, unspecified: Secondary | ICD-10-CM | POA: Diagnosis not present

## 2014-07-26 DIAGNOSIS — N421 Congestion and hemorrhage of prostate: Secondary | ICD-10-CM | POA: Diagnosis present

## 2014-07-26 DIAGNOSIS — N138 Other obstructive and reflux uropathy: Secondary | ICD-10-CM | POA: Diagnosis present

## 2014-07-26 DIAGNOSIS — J948 Other specified pleural conditions: Secondary | ICD-10-CM | POA: Diagnosis not present

## 2014-07-26 DIAGNOSIS — R918 Other nonspecific abnormal finding of lung field: Secondary | ICD-10-CM | POA: Diagnosis not present

## 2014-07-26 DIAGNOSIS — I2699 Other pulmonary embolism without acute cor pulmonale: Secondary | ICD-10-CM | POA: Diagnosis present

## 2014-07-26 DIAGNOSIS — Z7982 Long term (current) use of aspirin: Secondary | ICD-10-CM | POA: Diagnosis not present

## 2014-07-26 DIAGNOSIS — Z79899 Other long term (current) drug therapy: Secondary | ICD-10-CM

## 2014-07-26 DIAGNOSIS — Z85118 Personal history of other malignant neoplasm of bronchus and lung: Secondary | ICD-10-CM

## 2014-07-26 DIAGNOSIS — R310 Gross hematuria: Secondary | ICD-10-CM | POA: Diagnosis not present

## 2014-07-26 LAB — PROTIME-INR
INR: 1.16 (ref 0.00–1.49)
PROTHROMBIN TIME: 14.9 s (ref 11.6–15.2)

## 2014-07-26 LAB — URINALYSIS, ROUTINE W REFLEX MICROSCOPIC
BILIRUBIN URINE: NEGATIVE
Glucose, UA: NEGATIVE mg/dL
Ketones, ur: NEGATIVE mg/dL
LEUKOCYTES UA: NEGATIVE
NITRITE: NEGATIVE
Protein, ur: >300 mg/dL — AB
Specific Gravity, Urine: 1.016 (ref 1.005–1.030)
Urobilinogen, UA: 1 mg/dL (ref 0.0–1.0)
pH: 6 (ref 5.0–8.0)

## 2014-07-26 LAB — URINE MICROSCOPIC-ADD ON

## 2014-07-26 LAB — CBC
HCT: 30.2 % — ABNORMAL LOW (ref 39.0–52.0)
HEMOGLOBIN: 10.4 g/dL — AB (ref 13.0–17.0)
MCH: 36.1 pg — ABNORMAL HIGH (ref 26.0–34.0)
MCHC: 34.4 g/dL (ref 30.0–36.0)
MCV: 104.9 fL — ABNORMAL HIGH (ref 78.0–100.0)
Platelets: 208 10*3/uL (ref 150–400)
RBC: 2.88 MIL/uL — ABNORMAL LOW (ref 4.22–5.81)
RDW: 14.3 % (ref 11.5–15.5)
WBC: 7.7 10*3/uL (ref 4.0–10.5)

## 2014-07-26 LAB — BASIC METABOLIC PANEL
ANION GAP: 14 (ref 5–15)
BUN: 19 mg/dL (ref 6–23)
CHLORIDE: 97 meq/L (ref 96–112)
CO2: 20 meq/L (ref 19–32)
Calcium: 9.2 mg/dL (ref 8.4–10.5)
Creatinine, Ser: 1.64 mg/dL — ABNORMAL HIGH (ref 0.50–1.35)
GFR calc Af Amer: 47 mL/min — ABNORMAL LOW (ref >90)
GFR calc non Af Amer: 40 mL/min — ABNORMAL LOW (ref >90)
GLUCOSE: 135 mg/dL — AB (ref 70–99)
POTASSIUM: 4.5 meq/L (ref 3.7–5.3)
SODIUM: 131 meq/L — AB (ref 137–147)

## 2014-07-26 MED ORDER — ENOXAPARIN SODIUM 100 MG/ML ~~LOC~~ SOLN
1.0000 mg/kg | Freq: Two times a day (BID) | SUBCUTANEOUS | Status: DC
  Administered 2014-07-26 – 2014-07-27 (×2): 85 mg via SUBCUTANEOUS
  Filled 2014-07-26 (×4): qty 1

## 2014-07-26 MED ORDER — SODIUM CHLORIDE 0.9 % IV SOLN
INTRAVENOUS | Status: DC
  Administered 2014-07-27 – 2014-08-04 (×9): via INTRAVENOUS

## 2014-07-26 MED ORDER — ONDANSETRON HCL 4 MG/2ML IJ SOLN
4.0000 mg | Freq: Once | INTRAMUSCULAR | Status: AC
  Administered 2014-07-26: 4 mg via INTRAVENOUS
  Filled 2014-07-26: qty 2

## 2014-07-26 MED ORDER — ONDANSETRON HCL 4 MG PO TABS: 4.0000 mg | ORAL_TABLET | Freq: Four times a day (QID) | ORAL | Status: DC | PRN

## 2014-07-26 MED ORDER — ATORVASTATIN CALCIUM 40 MG PO TABS
40.0000 mg | ORAL_TABLET | Freq: Every day | ORAL | Status: DC
  Administered 2014-07-27 – 2014-08-04 (×9): 40 mg via ORAL
  Filled 2014-07-26 (×10): qty 1

## 2014-07-26 MED ORDER — SODIUM CHLORIDE 0.9 % IV SOLN
1000.0000 mL | Freq: Once | INTRAVENOUS | Status: AC
  Administered 2014-07-26: 1000 mL via INTRAVENOUS

## 2014-07-26 MED ORDER — SODIUM CHLORIDE 0.9 % IV SOLN
1000.0000 mL | INTRAVENOUS | Status: DC
  Administered 2014-07-26: 1000 mL via INTRAVENOUS

## 2014-07-26 MED ORDER — ONDANSETRON HCL 4 MG/2ML IJ SOLN
4.0000 mg | Freq: Four times a day (QID) | INTRAMUSCULAR | Status: DC | PRN
Start: 2014-07-26 — End: 2014-08-05

## 2014-07-26 MED ORDER — PREDNISONE 5 MG PO TABS: 5.0000 mg | ORAL_TABLET | Freq: Every day | ORAL | Status: DC

## 2014-07-26 MED ORDER — MORPHINE SULFATE 4 MG/ML IJ SOLN
4.0000 mg | Freq: Once | INTRAMUSCULAR | Status: AC
Start: 2014-07-26 — End: 2014-07-26
  Administered 2014-07-26: 4 mg via INTRAVENOUS
  Filled 2014-07-26: qty 1

## 2014-07-26 MED ORDER — ACETAMINOPHEN 500 MG PO TABS
500.0000 mg | ORAL_TABLET | Freq: Four times a day (QID) | ORAL | Status: DC | PRN
  Administered 2014-07-26 – 2014-08-01 (×5): 500 mg via ORAL
  Filled 2014-07-26 (×7): qty 1

## 2014-07-26 MED ORDER — VERAPAMIL HCL ER 240 MG PO TBCR
240.0000 mg | EXTENDED_RELEASE_TABLET | Freq: Two times a day (BID) | ORAL | Status: DC
  Administered 2014-07-26 – 2014-08-05 (×19): 240 mg via ORAL
  Filled 2014-07-26 (×21): qty 1

## 2014-07-26 MED ORDER — ALPRAZOLAM 0.5 MG PO TABS
0.5000 mg | ORAL_TABLET | Freq: Three times a day (TID) | ORAL | Status: DC | PRN
  Administered 2014-07-27 – 2014-08-03 (×8): 0.5 mg via ORAL
  Filled 2014-07-26 (×9): qty 1

## 2014-07-26 NOTE — ED Provider Notes (Signed)
CSN: [id number]     Arrival date & time 07/26/14  1008 History   First MD Initiated Contact with Patient 07/26/14 1010     Chief Complaint  Patient presents with  . Hematuria  . Flank Pain      HPI Patient presents with worsening hematuria the past 2 weeks since being initiated on anticoagulation for new diagnosis of pulmonary embolism.  His anticoagulation was stopped for several days and his hematuria resolved.  When his anticoagulation was reinitiated with Lovenox injection several days ago his hematuria returned.  He has had some clots noted.  He denies urethral obstruction and urinary retention.  He does report new and increasing right flank pain.  His had nausea without vomiting.  He denies diarrhea.  No fevers or chills.  No pain with urination or burning with urination.  He denies chest pain or shortness of breath at this time.  His initial anticoagulation was xarelto, then it was switched to Lovenox injections with the plan to initiate Coumadin in the coming days.  Currently he has not started any Coumadin.  No abdominal pain.  History of prostate cancer.   Past Medical History  Diagnosis Date  . Hypertension   . Hypercholesteremia   . Glucose intolerance (impaired glucose tolerance)   . Kidney stones, calcium oxalate   . CAD (coronary artery disease)   . 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 Adenomatous colon polyp     Dr. Amedeo Plenty  . Prostate cancer 2009    Dr. Eulogio Ditch; tx w/ radium implants  . Lung cancer    Past Surgical History  Procedure Laterality Date  . Lobectomy  1999    left - Dr. Cyndia Bent  . Repair right testicular torsion  1962  . Cardiac catheterization  1997    Dr. Martinique  . Prostate radium implants  2009  . Lung cancer surgery  2003  . Colonoscopy  ;11/12/99;04/29/03    12/27/09  . Lumbar laminectomy/decompression microdiscectomy  08/10/2012    Procedure: LUMBAR LAMINECTOMY/DECOMPRESSION MICRODISCECTOMY 1 LEVEL;  Surgeon: Winfield Cunas, MD;   Location: [facility];  Service: Neurosurgery;  Laterality: Left;  LEFT Lumbar five-sacral one microdiskectomy   Family History  Problem Relation Age of Onset  . Heart disease Mother   . Hypertension Mother   . Heart disease Father   . Hypertension Father   . Stroke Father   . Other Brother     spinal meningitis  . Other Brother     MVA  . Cancer Sister     stomach   History  Substance Use Topics  . Smoking status: Former Smoker -- 2.00 packs/day for 35 years    Types: Cigarettes    Quit date: 10/17/1982  . Smokeless tobacco: Former Systems developer    Types: Chew    Quit date: 03/15/2012  . Alcohol Use: 7.0 oz/week    14 drink(s) per week     Comment: 2 drinks a day before supper but has not had in 1 month    Review of Systems  All other systems reviewed and are negative.     Allergies  Codeine and Ibuprofen  Home Medications   Prior to Admission medications   Medication Sig Start Date End Date Taking? Authorizing Provider  acetaminophen (TYLENOL) 500 MG tablet Take 500 mg by mouth every 6 (six) hours as needed for moderate pain (pain).    Yes Historical Provider, MD  allopurinol (ZYLOPRIM) 100 MG tablet Take 100 mg  by mouth Daily. 07/23/12  Yes Historical Provider, MD  ALPRAZolam Duanne Moron) 0.5 MG tablet Take 0.5 mg by mouth 3 (three) times daily as needed for anxiety (anxiety).    Yes Historical Provider, MD  aspirin 81 MG tablet Take 81 mg by mouth daily.   Yes Historical Provider, MD  atorvastatin (LIPITOR) 40 MG tablet Take 40 mg by mouth every morning.    Yes Historical Provider, MD  Cyanocobalamin (VITAMIN B 12 PO) Take 1,000 mcg by mouth daily.     Yes Historical Provider, MD  enoxaparin (LOVENOX) 80 MG/0.8ML injection Inject 80 mg into the skin every 12 (twelve) hours. For 5 Days.   Yes Historical Provider, MD  folic acid (FOLVITE) 1 MG tablet Take 1 mg by mouth daily.   Yes Historical Provider, MD  glucosamine-chondroitin 500-400 MG tablet Take 1 tablet by mouth 2 (two)  times daily.    Yes Historical Provider, MD  lisinopril (PRINIVIL,ZESTRIL) 10 MG tablet Take 10 mg by mouth daily.   Yes Historical Provider, MD  methotrexate (RHEUMATREX) 2.5 MG tablet Take 15 mg by mouth every Wednesday. Caution:Chemotherapy. Protect from light.   Yes Historical Provider, MD  predniSONE (DELTASONE) 5 MG tablet Take 5 mg by mouth daily with breakfast.   Yes Historical Provider, MD  Rivaroxaban (XARELTO STARTER PACK PO) See Order Notes For Medication Instructions.   Yes Historical Provider, MD  verapamil (CALAN-SR) 240 MG CR tablet Take 240 mg by mouth 2 (two) times daily.     Yes Historical Provider, MD  nitroGLYCERIN (NITROSTAT) 0.4 MG SL tablet Place 0.4 mg under the tongue every 5 (five) minutes as needed. For chest pain    Historical Provider, MD   BP 155/90  Pulse 96  Temp(Src) 97.8 F (36.6 C) (Oral)  Resp 18  SpO2 100% Physical Exam  Nursing note and vitals reviewed. Constitutional: He is oriented to person, place, and time. He appears well-developed and well-nourished.  HENT:  Head: Normocephalic and atraumatic.  Eyes: EOM are normal.  Neck: Normal range of motion.  Cardiovascular: Normal rate, regular rhythm, normal heart sounds and intact distal pulses.   Pulmonary/Chest: Effort normal and breath sounds normal. No respiratory distress.  Abdominal: Soft. He exhibits no distension. There is no tenderness.  Musculoskeletal: Normal range of motion.  Mild right CVA tenderness  Neurological: He is alert and oriented to person, place, and time.  Skin: Skin is warm and dry.  Psychiatric: He has a normal mood and affect. Judgment normal.    ED Course  Procedures (including critical care time) Labs Review Labs Reviewed  URINALYSIS, ROUTINE W REFLEX MICROSCOPIC - Abnormal; Notable for the following:    Color, Urine RED (*)    APPearance TURBID (*)    Hgb urine dipstick LARGE (*)    Protein, ur >300 (*)    All other components within normal limits  CBC -  Abnormal; Notable for the following:    RBC 2.88 (*)    Hemoglobin 10.4 (*)    HCT 30.2 (*)    MCV 104.9 (*)    MCH 36.1 (*)    All other components within normal limits  BASIC METABOLIC PANEL - Abnormal; Notable for the following:    Sodium 131 (*)    Glucose, Bld 135 (*)    Creatinine, Ser 1.64 (*)    GFR calc non Af Amer 40 (*)    GFR calc Af Amer 47 (*)    All other components within normal limits  PROTIME-INR  URINE MICROSCOPIC-ADD ON    Imaging Review Ct Renal Stone Study  07/26/2014   CLINICAL DATA:  Initial encounter for the right flank pain and hematuria. Personal history of lung cancer. Recent history of pulmonary embolus. Personal history of prostate cancer.  EXAM: CT ABDOMEN AND PELVIS WITHOUT CONTRAST  TECHNIQUE: Multidetector CT imaging of the abdomen and pelvis was performed following the standard protocol without IV contrast.  COMPARISON:  CT abdomen and pelvis without and with contrast 09/09/2013. CTA chest 07/14/2014.  FINDINGS: A right pleural effusion is stable to slightly increased in size some prior study. Residual right lower lobe airspace disease remains with possible infarct lung tissue. There is pleural thickening about the effusion. Minimal atelectasis is present at the left base. The heart size is normal. Coronary artery calcifications are present. Small pericardial effusion is evident.  The liver and spleen are within normal limits. The stomach, duodenum, and pancreas are unremarkable. The common bile duct and gallbladder are within normal limits. The adrenal glands are normal bilaterally. There is marked inflammatory change surrounding the right kidney. Residual layering contrast is present within the collecting system. There is a transition point where the ureter crosses into the pelvis. No mass or stone is evident. The left kidney is unremarkable. There is some stranding without significant inflammation. The left ureter is within normal limits. Mild inferior wall  thickening is again seen along the urinary bladder. Prostate seeds are evident.  The rectosigmoid colon is mostly collapsed. The more proximal colon is within normal limits. The appendix is not discretely visualized and may be surgically absent. The small bowel is unremarkable. Prostate radiation seeds are noted.  The bone windows are unremarkable.  IMPRESSION: 1. Stable to slight increase in right pleural effusion with associated pleural thickening compatible with the chronicity of the fusion. 2. Persistent right lower lobe airspace disease, likely representing infarcted tissue. 3. Slight increase in small pericardial effusion. 4. Coronary artery calcifications. 5. Moderate right-sided hydronephrosis with layering contrast still visualized following CT angiogram study 2 weeks ago. 6. Focal transition of obstructed ureter at the pelvic inlet. No discrete mass or stone is evident. This may be secondary to surrounding inflammation. Urology consultation is recommended. 7. Prostate seeds.   Electronically Signed   By: Lawrence Santiago M.D.   On: 07/26/2014 11:45  I personally reviewed the imaging tests through PACS system I reviewed available ER/hospitalization records through the EMR    EKG Interpretation None      MDM   Final diagnoses:  Right flank pain  Hematuria  Other hydronephrosis    Patient with hematuria.  No urinary retention.  On anticoagulation.  He is too early to come off anticoagulation unless something like an IVC filter is considered.  CT stone study demonstrates right hydronephrosis with right hydroureter without a clear underlying cause of the obstruction itself.  There is delay of contrast from his prior contrasted studies.  Mild elevation in his creatinine therefore I do not think the patient needs a contrasted study at this time.  The patient is to be admitted the hospital and hydrated.  I discussed his case with urology Dr. Diona Fanti who will consult on the patient in the  hospital.  Given his age and his anticoagulation issues the patient will be admitted to the hospitalist.  Ongoing discussion whether or not the patient is a candidate for an IVC filter will need to be had.     Hoy Morn, MD 07/26/14 [phone]

## 2014-07-26 NOTE — Consult Note (Signed)
Urology Consult  Consulting [facility]  CC: Right flank pain, hematuria  HPI: This is a 73 year old male whom I have known from 2009, when he was diagnosed with and treated for adenocarcinoma prostate. He was biopsied in April, 2009 and completed combination radiotherapy in September of that year, receiving both external beam radiation] therapy. He has had an excellent PSA response since that time, and most recently his PSA was 0.02 in August, 2015.  He has had intermittent gross hematuria, either at the beginning or end of the stream. He underwent CT of the abdomen and pelvis/hematuria protocol and cystoscopy in November, 2014 for evaluation. It was felt that the small amount of bleeding that he was having was coming from his prostatic urethra, likely related to his radiation.  He is admitted at this time for gross hematuria and flank pain, with CT evidence of mid ureteral obstruction the right with proximal hydronephrosis. He's had a significant history recently. He presented to the emergency room with shortness of breath and chest pain on September 19. He was thought to have pneumonia, and treated with antibiotics at that time. Clinically, he did not improve, and he was admitted to the hospital 9 days later. At that time, he was found to have a right lower lobe pulmonary embolus. He was placed on  Xarelto that time. Prior to starting the Xarelto he did not have significant hematuria. Following that, his hematuria increased significantly. He subsequently returned to the emergency room with a hematuria. He was felt to have a UTI at that time, and placed on antibiotics. He did have pyuria. No culture was sent, however. He has had worsening hematuria and now new onset of flank pain. It is colicky in nature, not exacerbated or relieved by anything in particular. It is associated with nausea but no vomiting. He has had decreased appetite over the past 3 weeks or so. Oral intake has decreased. His gross  hematuria resolved when he went off of the Xarelto for 3 days, but restarted when he is back on anticoagulants. He is admitted today following a recent CT scan revealed right hydronephrosis with ureteral dilatation down to the mid right ureter. There is no evidence of renal mass or stone. Contrast has not been given. PMH: Past Medical History  Diagnosis Date  . Hypertension   . Hypercholesteremia   . Glucose intolerance (impaired glucose tolerance)   . Kidney stones, calcium oxalate   . CAD (coronary artery disease)   . 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 Adenomatous colon polyp     Dr. Amedeo Plenty  . Prostate cancer 2009    Dr. Eulogio Ditch; tx w/ radium implants  . Lung cancer     PSH: Past Surgical History  Procedure Laterality Date  . Lobectomy  1999    left - Dr. Cyndia Bent  . Repair right testicular torsion  1962  . Cardiac catheterization  1997    Dr. Martinique  . Prostate radium implants  2009  . Lung cancer surgery  2003  . Colonoscopy  ;11/12/99;04/29/03    12/27/09  . Lumbar laminectomy/decompression microdiscectomy  08/10/2012    Procedure: LUMBAR LAMINECTOMY/DECOMPRESSION MICRODISCECTOMY 1 LEVEL;  Surgeon: Winfield Cunas, MD;  Location: [facility];  Service: Neurosurgery;  Laterality: Left;  LEFT Lumbar five-sacral one microdiskectomy    Allergies: Allergies  Allergen Reactions  . Codeine Palpitations    Made heart hurt  . Ibuprofen Palpitations    Medications: Prescriptions prior to admission  Medication Sig Dispense Refill  . acetaminophen (TYLENOL) 500 MG tablet Take 500 mg by mouth every 6 (six) hours as needed for moderate pain (pain).       Marland Kitchen allopurinol (ZYLOPRIM) 100 MG tablet Take 100 mg by mouth Daily.      Marland Kitchen ALPRAZolam (XANAX) 0.5 MG tablet Take 0.5 mg by mouth 3 (three) times daily as needed for anxiety (anxiety).       Marland Kitchen aspirin 81 MG tablet Take 81 mg by mouth daily.      Marland Kitchen atorvastatin (LIPITOR) 40 MG tablet Take 40 mg by mouth every morning.        . Cyanocobalamin (VITAMIN B 12 PO) Take 1,000 mcg by mouth daily.        Marland Kitchen enoxaparin (LOVENOX) 80 MG/0.8ML injection Inject 80 mg into the skin every 12 (twelve) hours. For 5 Days.      . folic acid (FOLVITE) 1 MG tablet Take 1 mg by mouth daily.      Marland Kitchen glucosamine-chondroitin 500-400 MG tablet Take 1 tablet by mouth 2 (two) times daily.       Marland Kitchen lisinopril (PRINIVIL,ZESTRIL) 10 MG tablet Take 10 mg by mouth daily.      . methotrexate (RHEUMATREX) 2.5 MG tablet Take 15 mg by mouth every Wednesday. Caution:Chemotherapy. Protect from light.      . predniSONE (DELTASONE) 5 MG tablet Take 5 mg by mouth daily with breakfast.      . Rivaroxaban (XARELTO STARTER PACK PO) See Order Notes For Medication Instructions.      . verapamil (CALAN-SR) 240 MG CR tablet Take 240 mg by mouth 2 (two) times daily.        . nitroGLYCERIN (NITROSTAT) 0.4 MG SL tablet Place 0.4 mg under the tongue every 5 (five) minutes as needed. For chest pain         Social History: History   Social History  . Marital Status: Married    Spouse Name: N/A    Number of Children: 46  . Years of Education: N/A   Occupational History  . carpenter     [place] History Main Topics  . Smoking status: Former Smoker -- 2.00 packs/day for 35 years    Types: Cigarettes    Quit date: 10/17/1982  . Smokeless tobacco: Former Systems developer    Types: Chew    Quit date: 03/15/2012  . Alcohol Use: 7.0 oz/week    14 drink(s) per week     Comment: 2 drinks a day before supper but has not had in 1 month  . Drug Use: No  . Sexual Activity: Yes    Birth Control/ Protection: None   Other Topics Concern  . Not on file   Social History Narrative  . No narrative on file    Family History: Family History  Problem Relation Age of Onset  . Heart disease Mother   . Hypertension Mother   . Heart disease Father   . Hypertension Father   . Stroke Father   . Other Brother     spinal meningitis  . Other Brother     MVA  .  Cancer Sister     stomach    Review of Systems: Positive: Gross hematuria, colicky right flank pain, nausea, decreased appetite. Negative: No fever or chills.  A further 10 point review of systems was negative except what is listed in the HPI.  Physical Exam: @VITALS2 @ General: No acute distress.  Awake. Head:  Normocephalic.  Atraumatic. ENT:  EOMI.  Mucous membranes moist Neck:  Supple.  No lymphadenopathy. CV:  S1 present. S2 present. Regular rate. Pulmonary: Equal effort bilaterally.  Clear to auscultation bilaterally. Abdomen: Soft.  Right CVA tenderness present. There is were mild right upper lower quadrant tenderness. No rebound or guarding.. Skin:  Normal turgor.  No visible rash. Extremity: No gross deformity of bilateral upper extremities.  No gross deformity of    bilateral lower extremities. Neurologic: Alert. Appropriate mood.  Penis:  Uncircumcised.  No lesions. Urethra: No Foley catheter in place.  Orthotopic meatus. Scrotum: No lesions.  No ecchymosis.  No erythema. Testicles: Descended bilaterally.  No masses bilaterally. Testicles are somewhat atrophic Epididymis: Palpable bilaterally.  Non Tender to palpation. Rectal:  Normal anal sphincter tone. Gland flat, soft, nondistended nontender. No nodularity.  Studies:  Recent Labs     07/26/14  1051  HGB  10.4*  WBC  7.7  PLT  208    Recent Labs     07/26/14  1051  NA  131*  K  4.5  CL  97  CO2  20  BUN  19  CREATININE  1.64*  CALCIUM  9.2  GFRNONAA  40*  GFRAA  47*     Recent Labs     07/26/14  1051  INR  1.16     No components found with this basename: ABG,   I reviewed the patient's recent CT scan. There is right pyelocaliectasis. There is layering of material in his renal pelvis. This could be contrast administered 12 days ago or, perhaps, blood layering in the collecting system. I see no sign of kidney stone.  Assessment:  1. Right hydronephrosis, new onset. This is associated with gross  hematuria. This is unlikely to be from malignancy, but that must be ruled out. It would be interesting to see if his pain and hydronephrosis results with him coming off of anticoagulation. 2. Gross hematuria. This may be coming from the upper tract, or from bladder neck/prostatic bleeding. I don't think it's from an infection, as his urine no longer has nitrites or leukocytes. Unfortunately, this gross hematuria is present and worsens with him being on anticoagulation. Strong consideration should be given to placing an IVC filter in taking him off of anticoagulation. He will need further evaluation eventually, either a contrasted CT scan once his renal function improves or cystoscopy and right retrograde ureteropyelogram. I would feel most comfortable with these were done prior to his leaving the hospital. 3. Adenocarcinoma of the prostate, clinical stage [id number]. Initial Gleason score 4+3. Status post combination radiotherapy in September, 2009 with no evidence of recurrence to date  Plan: 1. Hydrate patient, treat his pain adequately 2. I would strongly consider eventual IVC filter in him. I think his hematuria will unfortunately continue with him being on anticoagulation 3. Consider hematuria CT if and when renal function is adequate. If that shows a normal upper tract and hydronephrosis resolving, no further diagnostic evaluation is necessary. There is questionable issues still based on that CT, would consider cystoscopy and right retrograde ureteropyelogram.  I'll continue to follow him during this hospitalization.    Pager:[phone]

## 2014-07-26 NOTE — ED Notes (Signed)
Patient transported to CT 

## 2014-07-26 NOTE — H&P (Signed)
PCP:   Marjorie Smolder, MD   Chief Complaint:  Hematuria  HPI: 73 year old male who   has a past medical history of Hypertension; Hypercholesteremia; Glucose intolerance (impaired glucose tolerance); Kidney stones, calcium oxalate; CAD (coronary artery disease); Adenosquamous carcinoma of lung (1999); Adenomatous colon polyp; Prostate cancer (2009); and Lung cancer. LLL lung ca s/p lobectomy 1999, prostate ca s/p seeding 2009, hx/o DVT, CAD, HTN, rheumatoid arthritis Today came  to the ED with chief complaint of flank pain and worsening hematuria for past 2 weeks. Patient was diagnosed with pulmonary embolism and discharged from the hospital on 07/15/2014 on anticoagulation with xarelto. As per patient he started having bloody urine the day after starting the xarelto, the hematuria stopped after he stopped taking the medication but when he called his PCP she asked him to start taking it again for the PE, and hematuria returned. He also noticed some clots in the urine. Denies fever or dysuria or urgency or frequency of urination. He also admits to having developed right flank pain with pain radiating to right abdominal wall. Last week the anteversion was stopped and patient was started on Lovenox injections. He continues to have hematuria, hemoglobin is 10.4. His last hemoglobin as of 07/06/2012 was 12.3  Allergies:   Allergies  Allergen Reactions  . Codeine Palpitations    Made heart hurt  . Ibuprofen Palpitations      Past Medical History  Diagnosis Date  . Hypertension   . Hypercholesteremia   . Glucose intolerance (impaired glucose tolerance)   . Kidney stones, calcium oxalate   . CAD (coronary artery disease)   . 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 Adenomatous colon polyp     Dr. Amedeo Plenty  . Prostate cancer 2009    Dr. Eulogio Ditch; tx w/ radium implants  . Lung cancer     Past Surgical History  Procedure Laterality Date  . Lobectomy  1999    left - Dr. Cyndia Bent    . Repair right testicular torsion  1962  . Cardiac catheterization  1997    Dr. Martinique  . Prostate radium implants  2009  . Lung cancer surgery  2003  . Colonoscopy  ;11/12/99;04/29/03    12/27/09  . Lumbar laminectomy/decompression microdiscectomy  08/10/2012    Procedure: LUMBAR LAMINECTOMY/DECOMPRESSION MICRODISCECTOMY 1 LEVEL;  Surgeon: Winfield Cunas, MD;  Location: [facility];  Service: Neurosurgery;  Laterality: Left;  LEFT Lumbar five-sacral one microdiskectomy    Prior to Admission medications   Medication Sig Start Date End Date Taking? Authorizing Provider  acetaminophen (TYLENOL) 500 MG tablet Take 500 mg by mouth every 6 (six) hours as needed for moderate pain (pain).    Yes Historical Provider, MD  allopurinol (ZYLOPRIM) 100 MG tablet Take 100 mg by mouth Daily. 07/23/12  Yes Historical Provider, MD  ALPRAZolam Duanne Moron) 0.5 MG tablet Take 0.5 mg by mouth 3 (three) times daily as needed for anxiety (anxiety).    Yes Historical Provider, MD  aspirin 81 MG tablet Take 81 mg by mouth daily.   Yes Historical Provider, MD  atorvastatin (LIPITOR) 40 MG tablet Take 40 mg by mouth every morning.    Yes Historical Provider, MD  Cyanocobalamin (VITAMIN B 12 PO) Take 1,000 mcg by mouth daily.     Yes Historical Provider, MD  enoxaparin (LOVENOX) 80 MG/0.8ML injection Inject 80 mg into the skin every 12 (twelve) hours. For 5 Days.   Yes Historical Provider, MD  folic acid (FOLVITE)  1 MG tablet Take 1 mg by mouth daily.   Yes Historical Provider, MD  glucosamine-chondroitin 500-400 MG tablet Take 1 tablet by mouth 2 (two) times daily.    Yes Historical Provider, MD  lisinopril (PRINIVIL,ZESTRIL) 10 MG tablet Take 10 mg by mouth daily.   Yes Historical Provider, MD  methotrexate (RHEUMATREX) 2.5 MG tablet Take 15 mg by mouth every Wednesday. Caution:Chemotherapy. Protect from light.   Yes Historical Provider, MD  predniSONE (DELTASONE) 5 MG tablet Take 5 mg by mouth daily with breakfast.    Yes Historical Provider, MD  Rivaroxaban (XARELTO STARTER PACK PO) See Order Notes For Medication Instructions.   Yes Historical Provider, MD  verapamil (CALAN-SR) 240 MG CR tablet Take 240 mg by mouth 2 (two) times daily.     Yes Historical Provider, MD  nitroGLYCERIN (NITROSTAT) 0.4 MG SL tablet Place 0.4 mg under the tongue every 5 (five) minutes as needed. For chest pain    Historical Provider, MD    Social History:  reports that he quit smoking about 31 years ago. His smoking use included Cigarettes. He has a 70 pack-year smoking history. He quit smokeless tobacco use about 2 years ago. His smokeless tobacco use included Chew. He reports that he drinks about 7 ounces of alcohol per week. He reports that he does not use illicit drugs.  Family History  Problem Relation Age of Onset  . Heart disease Mother   . Hypertension Mother   . Heart disease Father   . Hypertension Father   . Stroke Father   . Other Brother     spinal meningitis  . Other Brother     MVA  . Cancer Sister     stomach     All the positives are listed in BOLD  Review of Systems:  HEENT: Headache, blurred vision, runny nose, sore throat Neck: Hypothyroidism, hyperthyroidism,,lymphadenopathy Chest : Shortness of breath, history of COPD, Asthma Heart : Chest pain, history of coronary arterey disease GI:  Nausea, vomiting, diarrhea, constipation, GERD GU: Dysuria, urgency, frequency of urination, hematuria Neuro: Stroke, seizures, syncope Psych: Depression, anxiety, hallucinations   Physical Exam: Blood pressure 123/81, pulse 92, temperature 97.8 F (36.6 C), temperature source Oral, resp. rate 16, SpO2 98.00%. Constitutional:   Patient is a well-developed and well-nourished male* in no acute distress and cooperative with exam. Head: Normocephalic and atraumatic Mouth: Mucus membranes moist Eyes: PERRL, EOMI, conjunctivae normal Neck: Supple, No Thyromegaly Cardiovascular: RRR, S1 normal, S2  normal Pulmonary/Chest: CTAB, no wheezes, rales, or rhonchi Abdominal: Soft. Positive right CVA tenderness, non-distended, bowel sounds are normal, no masses, organomegaly, or guarding present.  Neurological: A&O x3, Strenght is normal and symmetric bilaterally, cranial nerve II-XII are grossly intact, no focal motor deficit, sensory intact to light touch bilaterally.  Extremities : No Cyanosis, Clubbing or Edema  Labs on Admission:  Basic Metabolic Panel:  Recent Labs Lab 07/26/14 1051  NA 131*  K 4.5  CL 97  CO2 20  GLUCOSE 135*  BUN 19  CREATININE 1.64*  CALCIUM 9.2   Liver Function Tests: No results found for this basename: AST, ALT, ALKPHOS, BILITOT, PROT, ALBUMIN,  in the last 168 hours No results found for this basename: LIPASE, AMYLASE,  in the last 168 hours No results found for this basename: AMMONIA,  in the last 168 hours CBC:  Recent Labs Lab 07/26/14 1051  WBC 7.7  HGB 10.4*  HCT 30.2*  MCV 104.9*  PLT 208   Cardiac Enzymes: No results  found for this basename: CKTOTAL, CKMB, CKMBINDEX, TROPONINI,  in the last 168 hours  BNP (last 3 results)  Recent Labs  07/05/14 0506  PROBNP 116.1   CBG: No results found for this basename: GLUCAP,  in the last 168 hours  Radiological Exams on Admission: Ct Renal Stone Study  07/26/2014   CLINICAL DATA:  Initial encounter for the right flank pain and hematuria. Personal history of lung cancer. Recent history of pulmonary embolus. Personal history of prostate cancer.  EXAM: CT ABDOMEN AND PELVIS WITHOUT CONTRAST  TECHNIQUE: Multidetector CT imaging of the abdomen and pelvis was performed following the standard protocol without IV contrast.  COMPARISON:  CT abdomen and pelvis without and with contrast 09/09/2013. CTA chest 07/14/2014.  FINDINGS: A right pleural effusion is stable to slightly increased in size some prior study. Residual right lower lobe airspace disease remains with possible infarct lung tissue. There is  pleural thickening about the effusion. Minimal atelectasis is present at the left base. The heart size is normal. Coronary artery calcifications are present. Small pericardial effusion is evident.  The liver and spleen are within normal limits. The stomach, duodenum, and pancreas are unremarkable. The common bile duct and gallbladder are within normal limits. The adrenal glands are normal bilaterally. There is marked inflammatory change surrounding the right kidney. Residual layering contrast is present within the collecting system. There is a transition point where the ureter crosses into the pelvis. No mass or stone is evident. The left kidney is unremarkable. There is some stranding without significant inflammation. The left ureter is within normal limits. Mild inferior wall thickening is again seen along the urinary bladder. Prostate seeds are evident.  The rectosigmoid colon is mostly collapsed. The more proximal colon is within normal limits. The appendix is not discretely visualized and may be surgically absent. The small bowel is unremarkable. Prostate radiation seeds are noted.  The bone windows are unremarkable.  IMPRESSION: 1. Stable to slight increase in right pleural effusion with associated pleural thickening compatible with the chronicity of the fusion. 2. Persistent right lower lobe airspace disease, likely representing infarcted tissue. 3. Slight increase in small pericardial effusion. 4. Coronary artery calcifications. 5. Moderate right-sided hydronephrosis with layering contrast still visualized following CT angiogram study 2 weeks ago. 6. Focal transition of obstructed ureter at the pelvic inlet. No discrete mass or stone is evident. This may be secondary to surrounding inflammation. Urology consultation is recommended. 7. Prostate seeds.   Electronically Signed   By: Lawrence Santiago M.D.   On: 07/26/2014 11:45       Assessment/Plan Principal Problem:   Hematuria Active Problems:    Hypertension   Kidney stones, calcium oxalate   Prostate cancer   Embolism, pulmonary with infarction   Rheumatoid arthritis  Hematuria CT scan of the abdomen in the ED revealed moderate right-sided hydronephrosis, urology Dr Dorina Hoyer was consulted by the ED physician, who will see the patient is consult. Patient has history of kidney stones in the past.  History of recent pulmonary embolism Patient was recently diagnosed with pulmonary embolism, we'll continue with anticoagulation with Lovenox per pharmacy.  Anemia Hemoglobin has dropped from 12.3-10.4, we'll continue to monitor the hemoglobin.   Hypertension Continue verapamil, will hold the lisinopril at this time due to the renal insufficiency  Acute kidney injury Today creatinine is 1.64, baseline creatinine is 1.01. Likely due to the hydronephrosis. Will hold ACE inhibitor and follow BMP in a.m.  Code status: Patient is full code  Family discussion:  Admission, patients condition and plan of care including tests being ordered have been discussed with the patient and *his wife and daughter at bedside* who indicate understanding and agree with the plan and Code Status.   Time Spent on Admission: 60 minutes  [facility] Pager: [phone] 07/26/2014, 2:07 PM  If 7PM-7AM, please contact night-coverage  [email or web address]  Password [email or web address]

## 2014-07-26 NOTE — ED Notes (Signed)
Bed: MR61 Expected date: 07/26/14 Expected time: 10:00 AM Means of arrival: Ambulance Comments: Blood in urine, history of kidney stone

## 2014-07-26 NOTE — ED Notes (Signed)
Pt unable to provide urine at this time, but has been provided with urinal

## 2014-07-26 NOTE — ED Notes (Addendum)
Pt from home via [facility]-Per EMS pt called for transport d/t hematuria R flank pain. Pt was dx with kidney stone recently. Pt is A&O and in NAD. Pt has hx of PE and Lung CA and is on 2L at home

## 2014-07-26 NOTE — ED Notes (Signed)
Pt from home c/o hematuria. Pt denies kidney stone. Pt sts that he has had blood in urine for over a month. Pt was given abx for UTI, but has not relieved hematuria. Pt has also been recently dx with PE and is taking Lovenox injections. Pt is A&O and in NAD

## 2014-07-27 DIAGNOSIS — I2699 Other pulmonary embolism without acute cor pulmonale: Secondary | ICD-10-CM

## 2014-07-27 DIAGNOSIS — N1330 Unspecified hydronephrosis: Secondary | ICD-10-CM | POA: Diagnosis present

## 2014-07-27 DIAGNOSIS — D62 Acute posthemorrhagic anemia: Secondary | ICD-10-CM | POA: Diagnosis present

## 2014-07-27 DIAGNOSIS — N179 Acute kidney failure, unspecified: Secondary | ICD-10-CM

## 2014-07-27 DIAGNOSIS — Z86718 Personal history of other venous thrombosis and embolism: Secondary | ICD-10-CM

## 2014-07-27 DIAGNOSIS — E43 Unspecified severe protein-calorie malnutrition: Secondary | ICD-10-CM | POA: Diagnosis present

## 2014-07-27 DIAGNOSIS — R634 Abnormal weight loss: Secondary | ICD-10-CM

## 2014-07-27 DIAGNOSIS — Z7901 Long term (current) use of anticoagulants: Secondary | ICD-10-CM

## 2014-07-27 DIAGNOSIS — E669 Obesity, unspecified: Secondary | ICD-10-CM | POA: Diagnosis present

## 2014-07-27 DIAGNOSIS — R319 Hematuria, unspecified: Secondary | ICD-10-CM

## 2014-07-27 LAB — HEMOGLOBIN AND HEMATOCRIT, BLOOD
HCT: 23 % — ABNORMAL LOW (ref 39.0–52.0)
HEMATOCRIT: 22.4 % — AB (ref 39.0–52.0)
HEMATOCRIT: 22.9 % — AB (ref 39.0–52.0)
HEMOGLOBIN: 7.7 g/dL — AB (ref 13.0–17.0)
Hemoglobin: 7.8 g/dL — ABNORMAL LOW (ref 13.0–17.0)
Hemoglobin: 7.8 g/dL — ABNORMAL LOW (ref 13.0–17.0)

## 2014-07-27 LAB — CBC
HCT: 21.9 % — ABNORMAL LOW (ref 39.0–52.0)
Hemoglobin: 7.5 g/dL — ABNORMAL LOW (ref 13.0–17.0)
MCH: 36.1 pg — ABNORMAL HIGH (ref 26.0–34.0)
MCHC: 34.2 g/dL (ref 30.0–36.0)
MCV: 105.3 fL — ABNORMAL HIGH (ref 78.0–100.0)
Platelets: 165 10*3/uL (ref 150–400)
RBC: 2.08 MIL/uL — ABNORMAL LOW (ref 4.22–5.81)
RDW: 14.7 % (ref 11.5–15.5)
WBC: 5 10*3/uL (ref 4.0–10.5)

## 2014-07-27 LAB — COMPREHENSIVE METABOLIC PANEL
ALT: 9 U/L (ref 0–53)
AST: 13 U/L (ref 0–37)
Albumin: 2.4 g/dL — ABNORMAL LOW (ref 3.5–5.2)
Alkaline Phosphatase: 47 U/L (ref 39–117)
Anion gap: 8 (ref 5–15)
BUN: 17 mg/dL (ref 6–23)
CO2: 23 mEq/L (ref 19–32)
Calcium: 8.2 mg/dL — ABNORMAL LOW (ref 8.4–10.5)
Chloride: 102 mEq/L (ref 96–112)
Creatinine, Ser: 1.8 mg/dL — ABNORMAL HIGH (ref 0.50–1.35)
GFR calc Af Amer: 42 mL/min — ABNORMAL LOW (ref >90)
GFR calc non Af Amer: 36 mL/min — ABNORMAL LOW (ref >90)
Glucose, Bld: 96 mg/dL (ref 70–99)
POTASSIUM: 4.6 meq/L (ref 3.7–5.3)
SODIUM: 133 meq/L — AB (ref 137–147)
TOTAL PROTEIN: 5.6 g/dL — AB (ref 6.0–8.3)
Total Bilirubin: 0.7 mg/dL (ref 0.3–1.2)

## 2014-07-27 LAB — TSH: TSH: 4.05 u[IU]/mL (ref 0.350–4.500)

## 2014-07-27 MED ORDER — ENSURE COMPLETE PO LIQD
237.0000 mL | Freq: Two times a day (BID) | ORAL | Status: DC
  Administered 2014-07-29 – 2014-08-05 (×14): 237 mL via ORAL

## 2014-07-27 MED ORDER — BELLADONNA ALKALOIDS-OPIUM 16.2-60 MG RE SUPP
1.0000 | Freq: Three times a day (TID) | RECTAL | Status: DC | PRN
  Administered 2014-07-27: 1 via RECTAL
  Filled 2014-07-27 (×2): qty 1

## 2014-07-27 MED ORDER — LIDOCAINE HCL 2 % EX GEL
1.0000 "application " | Freq: Once | CUTANEOUS | Status: AC
  Administered 2014-07-27: 1 via URETHRAL
  Filled 2014-07-27: qty 5

## 2014-07-27 NOTE — Progress Notes (Signed)
Foley not draining well.  Multiple large clots found in tubing.  Drainage bag tubing changed. Urine is a lighter red, no clots noticed, and is draining without obstruction.  Will continue to monitor.  Daniel Vega

## 2014-07-27 NOTE — Progress Notes (Signed)
INITIAL NUTRITION ASSESSMENT  DOCUMENTATION CODES Per approved criteria  -Obesity grade 1   INTERVENTION: Continue Heart Healthy Diet Add Ensure Complete po BID, each supplement provides 350 kcal and 13 grams of protein Encourage intake RD to follow  NUTRITION DIAGNOSIS: Inadequate oral intake related to poor appetite as evidenced by wife.   Goal: Intake of meals and supplements to meet >905 estimated needs.  Monitor:  Intake, labs, weight trend  Reason for Assessment: MST and consult to assess nutritional status and needs  73 y.o. male  Admitting Dx: Hematuria  ASSESSMENT: Patient admitted with hematuria/acute blood loss anemia/chronic anticoagulation.  Hx includes prostate cancer.  10/11: Decreased appetite on and off for a month. Variable intake.  Poor prior to admit but improving currently per wife.  Height: Ht Readings from Last 1 Encounters:  07/26/14 5\' 6"  (1.676 m)    Weight: Wt Readings from Last 1 Encounters:  07/26/14 187 lb 1.6 oz (84.868 kg)    Ideal Body Weight: 142 lbs  % Ideal Body Weight: 132  Wt Readings from Last 10 Encounters:  07/26/14 187 lb 1.6 oz (84.868 kg)  07/14/14 191 lb 12.8 oz (87 kg)  10/25/13 197 lb 1.6 oz (89.404 kg)  01/24/13 188 lb 14.4 oz (85.684 kg)  08/07/12 177 lb 8 oz (80.513 kg)  07/24/12 177 lb 14.4 oz (80.695 kg)  02/11/12 182 lb 15.7 oz (83 kg)  12/14/11 178 lb 9.6 oz (81.012 kg)  09/06/11 174 lb 1.6 oz (78.971 kg)  07/19/11 178 lb 6 oz (80.91 kg)    Usual Body Weight: 191  % Usual Body Weight: 98  BMI:  Body mass index is 30.21 kg/(m^2).  Estimated Nutritional Needs: Kcal: 1800-1900 Protein: 70-80 Fluid: >/=2L daily  Skin: intact  Diet Order: Cardiac  EDUCATION NEEDS: -No education needs identified at this time   Intake/Output Summary (Last 24 hours) at 07/27/14 1041 Last data filed at 07/27/14 0503  Gross per 24 hour  Intake   1900 ml  Output   1130 ml  Net    770 ml       Labs:   Recent Labs Lab 07/26/14 1051 07/27/14 0450  NA 131* 133*  K 4.5 4.6  CL 97 102  CO2 20 23  BUN 19 17  CREATININE 1.64* 1.80*  CALCIUM 9.2 8.2*  GLUCOSE 135* 96    CBG (last 3)  No results found for this basename: GLUCAP,  in the last 72 hours  Scheduled Meds: . atorvastatin  40 mg Oral q1800  . verapamil  240 mg Oral BID    Continuous Infusions: . sodium chloride 75 mL/hr at 07/27/14 0950    Past Medical History  Diagnosis Date  . Hypertension   . Hypercholesteremia   . Glucose intolerance (impaired glucose tolerance)   . Kidney stones, calcium oxalate   . CAD (coronary artery disease)   . 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 Adenomatous colon polyp     Dr. Amedeo Plenty  . Prostate cancer 2009    Dr. Eulogio Ditch; tx w/ radium implants  . Lung cancer     Past Surgical History  Procedure Laterality Date  . Lobectomy  1999    left - Dr. Cyndia Bent  . Repair right testicular torsion  1962  . Cardiac catheterization  1997    Dr. Martinique  . Prostate radium implants  2009  . Lung cancer surgery  2003  . Colonoscopy  ;11/12/99;04/29/03    12/27/09  .  Lumbar laminectomy/decompression microdiscectomy  08/10/2012    Procedure: LUMBAR LAMINECTOMY/DECOMPRESSION MICRODISCECTOMY 1 LEVEL;  Surgeon: Winfield Cunas, MD;  Location: [facility];  Service: Neurosurgery;  Laterality: Left;  LEFT Lumbar five-sacral one microdiskectomy    Antonieta Iba, RD, LDN Clinical Inpatient Dietitian Pager:  [phone] Weekend and after hours pager:  [phone]

## 2014-07-27 NOTE — Progress Notes (Signed)
3 small blood clots noted in CBI foley drain tubing. Draining freely without obstruction.Harlene Ramus

## 2014-07-27 NOTE — Progress Notes (Signed)
Subjective: Patient reports that he feels the urge to urinate but cannot. He has passed a few clots overnight. He is not having significant flank pain.  Objective: Vital signs in last 24 hours: Temp:  [97.7 F (36.5 C)-98.1 F (36.7 C)] 98.1 F (36.7 C) (10/11 0502) Pulse Rate:  [69-96] 69 (10/11 0502) Resp:  [16-20] 20 (10/11 0502) BP: (101-155)/(53-90) 101/53 mmHg (10/11 0502) SpO2:  [97 %-100 %] 99 % (10/11 0502) Weight:  [84.868 kg (187 lb 1.6 oz)] 84.868 kg (187 lb 1.6 oz) (10/10 1350)  Intake/Output from previous day: 10/10 0701 - 10/11 0700 In: 1900 [P.O.:1000; I.V.:900] Out: 1130 [Urine:1130] Intake/Output this shift: Total I/O In: 1660 [P.O.:760; I.V.:900] Out: 5 [Urine:930]  Physical Exam:  Constitutional: Vital signs reviewed. WD WN in NAD   Eyes: PERRL, No scleral icterus.   Cardiovascular: RRR Pulmonary/Chest: Normal effort Abdominal: Soft. Non-tender, non-distended, bowel sounds are normal, no masses, organomegaly, or guarding present.  Genitourinary: Extremities: No cyanosis or edema   Lab Results:  Recent Labs  07/26/14 1051 07/27/14 0450  HGB 10.4* 7.5*  HCT 30.2* 21.9*   BMET  Recent Labs  07/26/14 1051 07/27/14 0450  NA 131* 133*  K 4.5 4.6  CL 97 102  CO2 20 23  GLUCOSE 135* 96  BUN 19 17  CREATININE 1.64* 1.80*  CALCIUM 9.2 8.2*    Recent Labs  07/26/14 1051  INR 1.16   No results found for this basename: LABURIN,  in the last 72 hours Results for orders placed during the hospital encounter of 07/14/14  CULTURE, BLOOD (ROUTINE X 2)     Status: None   Collection Time    07/14/14  7:01 PM      Result Value Ref Range Status   Specimen Description BLOOD RIGHT ARM   Final   Special Requests BOTTLES DRAWN AEROBIC AND ANAEROBIC 10CC   Final   Culture  Setup Time     Final   Value: 07/15/2014 00:36     Performed at [facility]   Culture     Final   Value: NO GROWTH 5 DAYS     Note: Culture results may be  compromised due to an excessive volume of blood received in culture bottles.     Performed at [facility]   Report Status 07/21/2014 FINAL   Final  CULTURE, BLOOD (ROUTINE X 2)     Status: None   Collection Time    07/14/14  7:14 PM      Result Value Ref Range Status   Specimen Description BLOOD HAND RIGHT   Final   Special Requests BOTTLES DRAWN AEROBIC AND ANAEROBIC 5CC   Final   Culture  Setup Time     Final   Value: 07/15/2014 00:30     Performed at [facility]   Culture     Final   Value: NO GROWTH 5 DAYS     Performed at [facility]   Report Status 07/21/2014 FINAL   Final    Studies/Results: Ct Renal Stone Study  07/26/2014   CLINICAL DATA:  Initial encounter for the right flank pain and hematuria. Personal history of lung cancer. Recent history of pulmonary embolus. Personal history of prostate cancer.  EXAM: CT ABDOMEN AND PELVIS WITHOUT CONTRAST  TECHNIQUE: Multidetector CT imaging of the abdomen and pelvis was performed following the standard protocol without IV contrast.  COMPARISON:  CT abdomen and pelvis without and with contrast 09/09/2013. CTA chest 07/14/2014.  FINDINGS: A right pleural effusion is stable to slightly increased in size some prior study. Residual right lower lobe airspace disease remains with possible infarct lung tissue. There is pleural thickening about the effusion. Minimal atelectasis is present at the left base. The heart size is normal. Coronary artery calcifications are present. Small pericardial effusion is evident.  The liver and spleen are within normal limits. The stomach, duodenum, and pancreas are unremarkable. The common bile duct and gallbladder are within normal limits. The adrenal glands are normal bilaterally. There is marked inflammatory change surrounding the right kidney. Residual layering contrast is present within the collecting system. There is a transition point where the ureter crosses into the pelvis. No mass  or stone is evident. The left kidney is unremarkable. There is some stranding without significant inflammation. The left ureter is within normal limits. Mild inferior wall thickening is again seen along the urinary bladder. Prostate seeds are evident.  The rectosigmoid colon is mostly collapsed. The more proximal colon is within normal limits. The appendix is not discretely visualized and may be surgically absent. The small bowel is unremarkable. Prostate radiation seeds are noted.  The bone windows are unremarkable.  IMPRESSION: 1. Stable to slight increase in right pleural effusion with associated pleural thickening compatible with the chronicity of the fusion. 2. Persistent right lower lobe airspace disease, likely representing infarcted tissue. 3. Slight increase in small pericardial effusion. 4. Coronary artery calcifications. 5. Moderate right-sided hydronephrosis with layering contrast still visualized following CT angiogram study 2 weeks ago. 6. Focal transition of obstructed ureter at the pelvic inlet. No discrete mass or stone is evident. This may be secondary to surrounding inflammation. Urology consultation is recommended. 7. Prostate seeds.   Electronically Signed   By: Lawrence Santiago M.D.   On: 07/26/2014 11:45    Assessment/Plan:  1. Right hydronephrosis, new onset. This is associated with gross hematuria. This is unlikely to be from malignancy, but that must be ruled out. It would be interesting to see if his pain and hydronephrosis results with him coming off of anticoagulation.   2. Gross hematuria. This may be coming from the upper tract, or from bladder neck/prostatic bleeding. I don't think it's from an infection, as his urine no longer has nitrites or leukocytes. His hemoglobin is lower today, I think more likely due to hemodilution then more significant bleeding. I would strongly consider stopping the anticoagulants and possible transfusion. Consideration should also be given to  eventual IVC filter placement.  3. Adenocarcinoma of the prostate, status post combination radiotherapy in September 2009. No evidence of recurrence today. Some of the hematuria may be due to bleeding from the bladder, although there was no evidence of bladder clots seen on recent CT scan. Nonetheless, I'll have Foley catheter placed today and put on CBI.    LOS: 1 day   Franchot Gallo M 07/27/2014, 5:57 AM

## 2014-07-27 NOTE — Progress Notes (Signed)
3 way foley placed & continuous bladder irrigation begun. Difficulty placing foley past prostate. Return of amber/red fluid.Harlene Ramus

## 2014-07-27 NOTE — Progress Notes (Signed)
Utilization review completed.  

## 2014-07-27 NOTE — Progress Notes (Signed)
Agree.  Will set up for IVC filter placement tomorrow.

## 2014-07-27 NOTE — Progress Notes (Signed)
Urine remains very bloody but no clots noted and pt is voiding QS and denies pain or sense of fullness

## 2014-07-27 NOTE — Progress Notes (Signed)
*  Preliminary Results* Bilateral lower extremity venous duplex completed. Bilateral lower extremities are negative for deep vein thrombosis. There is evidence of superficial vein thrombosis involving the left greater saphenous vein from the thigh to the ankle. There is no evidence of Baker's cyst bilaterally.  07/27/2014  Maudry Mayhew, RVT, RDCS, RDMS

## 2014-07-27 NOTE — Progress Notes (Signed)
IR aware of request for IVC filter. Chart, hx, labs reviewed. Previous PE diagnosed 9/28, DVT at left Saphenofemoral junction noted on 9/29. Placed on Xarelto. Now with gross hematuria, ARF  Xarelto has been stopped. Reviewed with Dr. Kathlene Cote Agree with plan fo repeat Venous duplex to reassess for DVT. Plan for NPO p MN Given ARF, will need to use CO2.  IR team will see pt Mon to discuss procedure.  Ascencion Dike PA-C Interventional Radiology 07/27/2014 12:18 PM

## 2014-07-27 NOTE — Progress Notes (Addendum)
Progress Note   Daniel Vega PIR:[id number] DOB: [birth year] DOA: 07/26/2014 PCP: Marjorie Smolder, MD   Brief Narrative:   Daniel Vega is an 73 y.o. male with a PMH of prostate cancer diagnosed 2009 status post radiation therapy, history of intermittent gross hematuria thought to be a consequence of his prior radiation treatments, pulmonary embolism on Xarelto, who was admitted 07/26/14 with gross hematuria and flank pain. CT done on admission showed evidence of mid right ureteral obstruction and hydronephrosis.  Creatinine also found to be elevated at 1.8 (baseline creatinine 1-1.2). Urology has been consulted.  Assessment/Plan:   Principal Problem:   Hematuria / acute blood loss anemia / chronic anticoagulation  Status post evaluation by urology.  Complicated by chronic anticoagulation.  Urology considering cystoscopy with right retrograde ureteropyelogram. Hematuria CT when renal function stable.  3 g drop in hemoglobin noted. Check serial H&H and transfuse if needed.  Hold Lovenox. Check lower extremity Dopplers to evaluate for residual clot.  IR consult for placement of IVC filter.  Active Problems:   Cold intolerance  Check TSH.    Grade I obesity / weight loss  Dietician consulted.    Hypercholesterolemia  Continue Lipitor.    Hypertension  Continue verapamil. Lisinopril on hold secondary to acute renal failure.    Prostate cancer  Status post combination radiotherapy 06/2008 with no evidence of disease recurrence.    Embolism, pulmonary with infarction  Unfortunately, the patient has had a 3 g drop in his hemoglobin on blood thinners.  Check lower extremity Dopplers for residual clot.  Will need IVC placement.    Rheumatoid arthritis  Takes methotrexate weekly along with Folvite.    Hydronephrosis, right / acute renal failure  Hydrate and hold lisinopril.    DVT Prophylaxis  SCDs if dopplers negative for LE clots.  Code Status:  Full. Family Communication: Wife updated at the bedside. Disposition Plan: Home when stable.   IV Access:    Peripheral IV   Procedures and diagnostic studies:    None.   Medical Consultants:    Dr. Franchot Gallo, Urology.   Other Consultants:    None.   Anti-Infectives:    None.  Subjective:   Daniel Vega denies current dyspnea or chest discomfort.  Feels weak/fatigued.  Appetite better today than it has been in the past 4 weeks.  Says he lost 15 lbs in the past month.    Objective:    Filed Vitals:   07/26/14 1330 07/26/14 1350 07/26/14 2019 07/27/14 0502  BP: 123/81 141/77 134/62 101/53  Pulse: 92 85 70 69  Temp:  97.7 F (36.5 C) 98.1 F (36.7 C) 98.1 F (36.7 C)  TempSrc:  Oral Oral Oral  Resp:  18 16 20   Height:  5\' 6"  (1.676 m)    Weight:  84.868 kg (187 lb 1.6 oz)    SpO2: 98% 98% 100% 99%    Intake/Output Summary (Last 24 hours) at 07/27/14 1022 Last data filed at 07/27/14 0503  Gross per 24 hour  Intake   1900 ml  Output   1130 ml  Net    770 ml    Exam: Gen:  NAD Cardiovascular:  RRR, No M/R/G Respiratory:  Lungs CTAB Gastrointestinal:  Abdomen softly distended, + BS Extremities:  No C/E/C   Data Reviewed:    Labs: Basic Metabolic Panel:  Recent Labs Lab 07/26/14 1051 07/27/14 0450  NA 131* 133*  K 4.5 4.6  CL 97 102  CO2 20 23  GLUCOSE 135* 96  BUN 19 17  CREATININE 1.64* 1.80*  CALCIUM 9.2 8.2*   GFR Estimated Creatinine Clearance: 37.9 ml/min (by C-G formula based on Cr of 1.8). Liver Function Tests:  Recent Labs Lab 07/27/14 0450  AST 13  ALT 9  ALKPHOS 47  BILITOT 0.7  PROT 5.6*  ALBUMIN 2.4*   Coagulation profile  Recent Labs Lab 07/26/14 1051  INR 1.16    CBC:  Recent Labs Lab 07/26/14 1051 07/27/14 0450  WBC 7.7 5.0  HGB 10.4* 7.5*  HCT 30.2* 21.9*  MCV 104.9* 105.3*  PLT 208 165   BNP (last 3 results)  Recent Labs  07/05/14 0506  PROBNP 116.1    Microbiology No results found for this or any previous visit (from the past 240 hour(s)).   Medications:   . atorvastatin  40 mg Oral q1800  . verapamil  240 mg Oral BID   Continuous Infusions: . sodium chloride 75 mL/hr at 07/27/14 0950    Time spent: 35 minutes with > 50% of time discussing current diagnostic test results, clinical impression and plan of care.    LOS: 1 day   Reighan Hipolito  [facility] Pager [phone]. If unable to reach me by pager, please call my cell phone at [phone].  *Please refer to [email or web address], password [email or web address] to get updated schedule on who will round on this patient, as hospitalists switch teams weekly. If 7PM-7AM, please contact night-coverage at [email or web address], password [email or web address] for any overnight needs.  07/27/2014, 10:22 AM

## 2014-07-27 NOTE — Progress Notes (Signed)
20 F 3 way Foley not available from supply. 51 F available & will place for continuous bladder irrigation.Harlene Ramus ladder

## 2014-07-28 ENCOUNTER — Inpatient Hospital Stay (HOSPITAL_COMMUNITY): Payer: Medicare Other

## 2014-07-28 ENCOUNTER — Encounter (HOSPITAL_COMMUNITY): Payer: Self-pay | Admitting: Radiology

## 2014-07-28 DIAGNOSIS — R109 Unspecified abdominal pain: Secondary | ICD-10-CM

## 2014-07-28 DIAGNOSIS — C61 Malignant neoplasm of prostate: Secondary | ICD-10-CM

## 2014-07-28 DIAGNOSIS — I10 Essential (primary) hypertension: Secondary | ICD-10-CM

## 2014-07-28 HISTORY — PX: IVC FILTER INSERTION: CATH118245

## 2014-07-28 LAB — CBC
HCT: 23.9 % — ABNORMAL LOW (ref 39.0–52.0)
Hemoglobin: 8.1 g/dL — ABNORMAL LOW (ref 13.0–17.0)
MCH: 35.5 pg — ABNORMAL HIGH (ref 26.0–34.0)
MCHC: 33.9 g/dL (ref 30.0–36.0)
MCV: 104.8 fL — ABNORMAL HIGH (ref 78.0–100.0)
Platelets: 178 10*3/uL (ref 150–400)
RBC: 2.28 MIL/uL — ABNORMAL LOW (ref 4.22–5.81)
RDW: 14.5 % (ref 11.5–15.5)
WBC: 8.3 10*3/uL (ref 4.0–10.5)

## 2014-07-28 LAB — HEMOGLOBIN AND HEMATOCRIT, BLOOD
HCT: 21.6 % — ABNORMAL LOW (ref 39.0–52.0)
Hemoglobin: 7.3 g/dL — ABNORMAL LOW (ref 13.0–17.0)

## 2014-07-28 LAB — BASIC METABOLIC PANEL
ANION GAP: 13 (ref 5–15)
BUN: 19 mg/dL (ref 6–23)
CALCIUM: 8.3 mg/dL — AB (ref 8.4–10.5)
CO2: 20 meq/L (ref 19–32)
CREATININE: 1.88 mg/dL — AB (ref 0.50–1.35)
Chloride: 102 mEq/L (ref 96–112)
GFR calc non Af Amer: 34 mL/min — ABNORMAL LOW (ref >90)
GFR, EST AFRICAN AMERICAN: 39 mL/min — AB (ref >90)
Glucose, Bld: 190 mg/dL — ABNORMAL HIGH (ref 70–99)
Potassium: 4.5 mEq/L (ref 3.7–5.3)
Sodium: 135 mEq/L — ABNORMAL LOW (ref 137–147)

## 2014-07-28 MED ORDER — MIDAZOLAM HCL 2 MG/2ML IJ SOLN
INTRAMUSCULAR | Status: AC | PRN
  Administered 2014-07-28: 0.5 mg via INTRAVENOUS
  Administered 2014-07-28: 1 mg via INTRAVENOUS

## 2014-07-28 MED ORDER — LIDOCAINE HCL 1 % IJ SOLN
INTRAMUSCULAR | Status: AC
  Filled 2014-07-28: qty 20

## 2014-07-28 MED ORDER — MORPHINE SULFATE 2 MG/ML IJ SOLN
2.0000 mg | INTRAMUSCULAR | Status: DC | PRN
  Administered 2014-07-28 – 2014-08-01 (×8): 2 mg via INTRAVENOUS
  Filled 2014-07-28 (×7): qty 1

## 2014-07-28 MED ORDER — FENTANYL CITRATE 0.05 MG/ML IJ SOLN
INTRAMUSCULAR | Status: AC
  Filled 2014-07-28: qty 6

## 2014-07-28 MED ORDER — MIDAZOLAM HCL 2 MG/2ML IJ SOLN
INTRAMUSCULAR | Status: AC
  Filled 2014-07-28: qty 6

## 2014-07-28 MED ORDER — FENTANYL CITRATE 0.05 MG/ML IJ SOLN
INTRAMUSCULAR | Status: AC | PRN
  Administered 2014-07-28 (×2): 25 ug via INTRAVENOUS

## 2014-07-28 MED ORDER — MORPHINE SULFATE 2 MG/ML IJ SOLN
INTRAMUSCULAR | Status: AC
  Filled 2014-07-28: qty 1

## 2014-07-28 NOTE — Procedures (Signed)
Interventional Radiology Procedure Note  Procedure: CO2 cavogram. Placement of [place] IVC filter below the lowest renal vein  Complications: No immediate Recommendations:   - Routine dressing care   Signed,  Dulcy Fanny. Earleen Newport, DO

## 2014-07-28 NOTE — Progress Notes (Signed)
Foley irrigated.  120ccs in. 2 clots removed.  Pt states that he feels much better since previous foley was removed and was irrigated. Pt states that he was comfortable enough to sleep.  Will continue to monitor.   Roselind Rily

## 2014-07-28 NOTE — Progress Notes (Signed)
Progress Note   Daniel Vega QRF:[id number] DOB: 1941/04/15 DOA: 07/26/2014 PCP: Marjorie Smolder, MD   Brief Narrative:   Daniel Vega is an 73 y.o. male with a PMH of prostate cancer diagnosed 2009 status post radiation therapy, history of intermittent gross hematuria thought to be a consequence of his prior radiation treatments, pulmonary embolism on Xarelto, who was admitted 07/26/14 with gross hematuria and flank pain. CT done on admission showed evidence of mid right ureteral obstruction and hydronephrosis.  Creatinine also found to be elevated at 1.8 (baseline creatinine 1-1.2). Urology has been consulted.  Assessment/Plan:   Principal Problem:   Hematuria / acute blood loss anemia / chronic anticoagulation  Status post evaluation by urology.  Complicated by chronic anticoagulation.  Urology considering cystoscopy with right retrograde ureteropyelogram. Hematuria CT when renal function stable.  3 g drop in hemoglobin noted, but stabilized after initial drop.  Lovenox discontinued. Lower extremity Dopplers negative for DVT, but ongoing superficial vein thrombosis of the left greater saphenous vein from the thigh to the ankle.  IR consulted for placement of IVC filter.  Active Problems:    Cold intolerance  TSH WNL.    Grade I obesity / weight loss  Dietician consulted.    Hypercholesterolemia  Continue Lipitor.    Hypertension  Continue verapamil. Lisinopril on hold secondary to acute renal failure.    Prostate cancer  Status post combination radiotherapy 06/2008 with no evidence of disease recurrence.    Embolism, pulmonary with infarction  Unfortunately, the patient has had a 3 g drop in his hemoglobin on blood thinners.  No residual DVT noted status post Doppler studies of the lower extremities  For IVC filter placement.    Rheumatoid arthritis  Takes methotrexate weekly along with Folvite.    Hydronephrosis, right / acute renal  failure  No improvement in renal function despite hydration and holding lisinopril.    DVT Prophylaxis  SCDs if dopplers negative for LE clots.  Code Status: Full. Family Communication: Wife updated at the bedside 07/27/14. Disposition Plan: Home when stable.   IV Access:    Peripheral IV   Procedures and diagnostic studies:    None.   Medical Consultants:    Dr. Franchot Gallo, Urology.  IR   Other Consultants:    None.   Anti-Infectives:    None.  Subjective:   Daniel Vega says "I'm better than I was at 3:00 a.m. When my catheter clotted up".  No current complaints of nausea of vomiting.  Has some penile discomfort with burning pain at the meatus.      Objective:    Filed Vitals:   07/27/14 0502 07/27/14 1428 07/27/14 2043 07/28/14 0446  BP: 101/53 113/64 147/76 113/55  Pulse: 69 75 82 79  Temp: 98.1 F (36.7 C) 98.4 F (36.9 C) 99 F (37.2 C) 98.3 F (36.8 C)  TempSrc: Oral Oral Oral Oral  Resp: 20 18 16 16   Height:      Weight:      SpO2: 99% 100% 100% 100%    Intake/Output Summary (Last 24 hours) at 07/28/14 0744 Last data filed at 07/28/14 0600  Gross per 24 hour  Intake 9591.25 ml  Output  13700 ml  Net -4108.75 ml    Exam: Gen:  NAD Cardiovascular:  RRR, No M/R/G Respiratory:  Lungs CTAB Gastrointestinal:  Abdomen softly distended, + BS GU: Foley draining blood tinged urine Extremities:  No C/E/C   Data Reviewed:  Labs: Basic Metabolic Panel:  Recent Labs Lab 07/26/14 1051 07/27/14 0450 07/28/14 0438  NA 131* 133* 135*  K 4.5 4.6 4.5  CL 97 102 102  CO2 20 23 20   GLUCOSE 135* 96 190*  BUN 19 17 19   CREATININE 1.64* 1.80* 1.88*  CALCIUM 9.2 8.2* 8.3*   GFR Estimated Creatinine Clearance: 36.3 ml/min (by C-G formula based on Cr of 1.88). Liver Function Tests:  Recent Labs Lab 07/27/14 0450  AST 13  ALT 9  ALKPHOS 47  BILITOT 0.7  PROT 5.6*  ALBUMIN 2.4*   Coagulation profile  Recent  Labs Lab 07/26/14 1051  INR 1.16    CBC:  Recent Labs Lab 07/26/14 1051 07/27/14 0450 07/27/14 1006 07/27/14 1622 07/27/14 2200 07/28/14 0438  WBC 7.7 5.0  --   --   --  8.3  HGB 10.4* 7.5* 7.8* 7.7* 7.8* 8.1*  HCT 30.2* 21.9* 23.0* 22.4* 22.9* 23.9*  MCV 104.9* 105.3*  --   --   --  104.8*  PLT 208 165  --   --   --  178   BNP (last 3 results)  Recent Labs  07/05/14 0506  PROBNP 116.1   Microbiology No results found for this or any previous visit (from the past 240 hour(s)).   Medications:   . atorvastatin  40 mg Oral q1800  . feeding supplement (ENSURE COMPLETE)  237 mL Oral BID BM  . morphine      . verapamil  240 mg Oral BID   Continuous Infusions: . sodium chloride 75 mL/hr at 07/27/14 2216    Time spent: 25 mintues.    LOS: 2 days   Narcissa Melder  [facility] Pager [phone]. If unable to reach me by pager, please call my cell phone at [phone].  *Please refer to [email or web address], password [email or web address] to get updated schedule on who will round on this patient, as hospitalists switch teams weekly. If 7PM-7AM, please contact night-coverage at [email or web address], password [email or web address] for any overnight needs.  07/28/2014, 7:44 AM

## 2014-07-28 NOTE — Progress Notes (Signed)
Patient ID: Daniel Vega, male   DOB: 08-16-1941, 73 y.o.   MRN: [id number]  I was called to see patient for an obstructed foley.     I was unable to irrigate the current foley and couldn't manipulate it leading me to believe it was not fully in the bladder.  I initially attempted to place a 85fr ainsworth without success and then was able to place a 24fr coude after a betadine prep and urethral lubrication.    With the 11fr coude in place I was able to irrigate out numerous clots and several hundred cc of bloody urine.  I was not able to get the urine to clear completely but was no longer getting clots so the foley was placed to SD and orders were left for hand irrigation prn.   He is to go for a caval filter later today and if the foley doesn't continue to drain, he may need a trip to the OR for clot evacuation and fulguration.     Imp: Gross Hematuria with clot retention.  Plan: foley to SD with hand irrigation prn.

## 2014-07-28 NOTE — H&P (Signed)
Reason for Consult: IVC filter with history of PE and unable to be on anticoagulation.  Chief Complaint: Chief Complaint  Patient presents with  . Hematuria  . Flank Pain   Referring Physician(s): TRH  History of Present Illness: Daniel Vega is a 73 y.o. male with history of PE, CTA 07/14/14 he has been on xarelto that has been discontinued this admission secondary to acute blood loss from gross hematuria. IR received request for retrievable IVC filter placement with moderate sedation. He denies any active chest pain, shortness of breath or palpitations. He denies any recent fever or chills. The patient denies any history of sleep apnea or chronic oxygen use. He has previously tolerated sedation without complications.   Past Medical History  Diagnosis Date  . Hypertension   . Hypercholesteremia   . Glucose intolerance (impaired glucose tolerance)   . Kidney stones, calcium oxalate   . CAD (coronary artery disease)   . 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 Adenomatous colon polyp     Dr. Amedeo Plenty  . Prostate cancer 2009    Dr. Eulogio Ditch; tx w/ radium implants  . Lung cancer     Past Surgical History  Procedure Laterality Date  . Lobectomy  1999    left - Dr. Cyndia Bent  . Repair right testicular torsion  1962  . Cardiac catheterization  1997    Dr. Martinique  . Prostate radium implants  2009  . Lung cancer surgery  2003  . Colonoscopy  ;11/12/99;04/29/03    12/27/09  . Lumbar laminectomy/decompression microdiscectomy  08/10/2012    Procedure: LUMBAR LAMINECTOMY/DECOMPRESSION MICRODISCECTOMY 1 LEVEL;  Surgeon: Winfield Cunas, MD;  Location: [facility];  Service: Neurosurgery;  Laterality: Left;  LEFT Lumbar five-sacral one microdiskectomy    Allergies: Codeine and Ibuprofen  Medications: Prior to Admission medications   Medication Sig Start Date End Date Taking? Authorizing Provider  acetaminophen (TYLENOL) 500 MG tablet Take 500 mg by mouth every 6 (six) hours  as needed for moderate pain (pain).    Yes Historical Provider, MD  allopurinol (ZYLOPRIM) 100 MG tablet Take 100 mg by mouth Daily. 07/23/12  Yes Historical Provider, MD  ALPRAZolam Duanne Moron) 0.5 MG tablet Take 0.5 mg by mouth 3 (three) times daily as needed for anxiety (anxiety).    Yes Historical Provider, MD  aspirin 81 MG tablet Take 81 mg by mouth daily.   Yes Historical Provider, MD  atorvastatin (LIPITOR) 40 MG tablet Take 40 mg by mouth every morning.    Yes Historical Provider, MD  Cyanocobalamin (VITAMIN B 12 PO) Take 1,000 mcg by mouth daily.     Yes Historical Provider, MD  enoxaparin (LOVENOX) 80 MG/0.8ML injection Inject 80 mg into the skin every 12 (twelve) hours. For 5 Days.   Yes Historical Provider, MD  folic acid (FOLVITE) 1 MG tablet Take 1 mg by mouth daily.   Yes Historical Provider, MD  glucosamine-chondroitin 500-400 MG tablet Take 1 tablet by mouth 2 (two) times daily.    Yes Historical Provider, MD  lisinopril (PRINIVIL,ZESTRIL) 10 MG tablet Take 10 mg by mouth daily.   Yes Historical Provider, MD  methotrexate (RHEUMATREX) 2.5 MG tablet Take 15 mg by mouth every Wednesday. Caution:Chemotherapy. Protect from light.   Yes Historical Provider, MD  predniSONE (DELTASONE) 5 MG tablet Take 5 mg by mouth daily with breakfast.   Yes Historical Provider, MD  Rivaroxaban (XARELTO STARTER PACK PO) See Order Notes For Medication Instructions.  Yes Historical Provider, MD  verapamil (CALAN-SR) 240 MG CR tablet Take 240 mg by mouth 2 (two) times daily.     Yes Historical Provider, MD  nitroGLYCERIN (NITROSTAT) 0.4 MG SL tablet Place 0.4 mg under the tongue every 5 (five) minutes as needed. For chest pain    Historical Provider, MD    Family History  Problem Relation Age of Onset  . Heart disease Mother   . Hypertension Mother   . Heart disease Father   . Hypertension Father   . Stroke Father   . Other Brother     spinal meningitis  . Other Brother     MVA  . Cancer Sister      stomach    History   Social History  . Marital Status: Married    Spouse Name: N/A    Number of Children: 44  . Years of Education: N/A   Occupational History  . carpenter     [place] History Main Topics  . Smoking status: Former Smoker -- 2.00 packs/day for 35 years    Types: Cigarettes    Quit date: 10/17/1982  . Smokeless tobacco: Former Systems developer    Types: Chew    Quit date: 03/15/2012  . Alcohol Use: 7.0 oz/week    14 drink(s) per week     Comment: 2 drinks a day before supper but has not had in 1 month  . Drug Use: No  . Sexual Activity: Yes    Birth Control/ Protection: None   Other Topics Concern  . None   Social History Narrative  . None    Review of Systems: A 12 point ROS discussed and pertinent positives are indicated in the HPI above.  All other systems are negative.  Review of Systems  Vital Signs: BP 113/55  Pulse 79  Temp(Src) 98.3 F (36.8 C) (Oral)  Resp 16  Ht 5\' 6"  (1.676 m)  Wt 187 lb 1.6 oz (84.868 kg)  BMI 30.21 kg/m2  SpO2 100%  Physical Exam  Constitutional: He is oriented to person, place, and time.  HENT:  Head: Normocephalic and atraumatic.  Neck: No tracheal deviation present.  Cardiovascular: Normal rate and regular rhythm.  Exam reveals no gallop and no friction rub.   No murmur heard. Pulmonary/Chest: Breath sounds normal. No respiratory distress. He has no wheezes. He has no rales.  Abdominal: Soft. Bowel sounds are normal.  Neurological: He is alert and oriented to person, place, and time.  Skin: Skin is warm and dry.  Psychiatric: He has a normal mood and affect. His behavior is normal. Thought content normal.  Foley intact with dark bloody urine.   Imaging: Dg Chest 2 View  07/14/2014   CLINICAL DATA:  Low oxygen saturation.  EXAM: CHEST  2 VIEW  COMPARISON:  07/05/2014.  05/22/2014.  FINDINGS: Opacity at the right base is stable since the previous study but new since 05/22/2014. This may be related  atelectasis although infection could have this appearance. Probable small associated right pleural effusion. Left lung is clear. The cardiopericardial silhouette is within normal limits for size. Imaged bony structures of the thorax are intact.  IMPRESSION: Stable. Right base atelectasis or pneumonia with small right pleural effusion. Overall imaging features are stable in the 9 days since the prior study.   Electronically Signed   By: Misty Stanley M.D.   On: 07/14/2014 15:17   Dg Chest 2 View  07/05/2014   CLINICAL DATA:  Shortness of  breath, chest pain and bilateral shoulder pain.  EXAM: CHEST  2 VIEW  COMPARISON:  Chest radiograph performed 05/22/2014  FINDINGS: The lungs are mildly hypoexpanded. Mild right basilar opacity may reflect atelectasis or possibly mild pneumonia. There is no evidence of pleural effusion or pneumothorax.  The heart is normal in size; the mediastinal contour is within normal limits. No acute osseous abnormalities are seen.  IMPRESSION: Mild right basilar opacity may reflect atelectasis or possibly mild pneumonia. Lungs mildly hypoexpanded.   Electronically Signed   By: Garald Balding M.D.   On: 07/05/2014 05:55   Ct Angio Chest Pe W/cm &/or Wo Cm  07/14/2014   CLINICAL DATA:  Recurrent dyspnea.  Short of breath.  Lung cancer.  EXAM: CT ANGIOGRAPHY CHEST WITH CONTRAST  TECHNIQUE: Multidetector CT imaging of the chest was performed using the standard protocol during bolus administration of intravenous contrast. Multiplanar CT image reconstructions and MIPs were obtained to evaluate the vascular anatomy.  CONTRAST:  157mL OMNIPAQUE IOHEXOL 350 MG/ML SOLN  COMPARISON:  Chest radiograph 07/10/2014.  FINDINGS: Bones: No aggressive osseous lesions. Thoracic spine DISH. Exaggerated thoracic kyphosis.  Cardiovascular: Acute pulmonary embolus is present in the RIGHT lower lobe. No evidence of RIGHT heart strain. Aortic atherosclerosis without an acute aortic abnormality. Coronary artery  atherosclerosis is present. If office based assessment of coronary risk factors has not been performed, it is now recommended.  Lungs: Airspace disease in the RIGHT lower lobe following the distribution of the pulmonary embolus compatible with pulmonary infarction. LEFT upper lobectomy. 9 mm solid and ground-glass attenuation nodule in the hyper expanded LEFT lower lobe (image 25 series 6).  Central airways: Patent.  Effusions: Small RIGHT pleural effusion layering dependently. Small pericardial effusion. No LEFT pleural effusion.  Lymphadenopathy: No axillary adenopathy. No mediastinal or hilar adenopathy.  Esophagus: Small hiatal hernia and patulous gastroesophageal junction.  Upper abdomen: Within normal limits.  Other: None.  Review of the MIP images confirms the above findings.  IMPRESSION: 1. Acute RIGHT lower lobe pulmonary embolus with RIGHT lower lobe pulmonary infarct. 2. 9 mm ground-glass attenuation nodule with smaller 5 mm solid component in the expanded LEFT lower lobe following LEFT upper lobectomy. This nodule will require followup. Three-month noncontrast chest CT recommended to assess for persistence. 3. Critical Value/emergent results were called by telephone at the time of interpretation on 07/14/2014 at 8:23 pm to Dr. Shanda Howells , who verbally acknowledged these results.   Electronically Signed   By: Dereck Ligas M.D.   On: 07/14/2014 20:24   Ct Renal Stone Study  07/26/2014   CLINICAL DATA:  Initial encounter for the right flank pain and hematuria. Personal history of lung cancer. Recent history of pulmonary embolus. Personal history of prostate cancer.  EXAM: CT ABDOMEN AND PELVIS WITHOUT CONTRAST  TECHNIQUE: Multidetector CT imaging of the abdomen and pelvis was performed following the standard protocol without IV contrast.  COMPARISON:  CT abdomen and pelvis without and with contrast 09/09/2013. CTA chest 07/14/2014.  FINDINGS: A right pleural effusion is stable to slightly  increased in size some prior study. Residual right lower lobe airspace disease remains with possible infarct lung tissue. There is pleural thickening about the effusion. Minimal atelectasis is present at the left base. The heart size is normal. Coronary artery calcifications are present. Small pericardial effusion is evident.  The liver and spleen are within normal limits. The stomach, duodenum, and pancreas are unremarkable. The common bile duct and gallbladder are within normal limits. The adrenal glands  are normal bilaterally. There is marked inflammatory change surrounding the right kidney. Residual layering contrast is present within the collecting system. There is a transition point where the ureter crosses into the pelvis. No mass or stone is evident. The left kidney is unremarkable. There is some stranding without significant inflammation. The left ureter is within normal limits. Mild inferior wall thickening is again seen along the urinary bladder. Prostate seeds are evident.  The rectosigmoid colon is mostly collapsed. The more proximal colon is within normal limits. The appendix is not discretely visualized and may be surgically absent. The small bowel is unremarkable. Prostate radiation seeds are noted.  The bone windows are unremarkable.  IMPRESSION: 1. Stable to slight increase in right pleural effusion with associated pleural thickening compatible with the chronicity of the fusion. 2. Persistent right lower lobe airspace disease, likely representing infarcted tissue. 3. Slight increase in small pericardial effusion. 4. Coronary artery calcifications. 5. Moderate right-sided hydronephrosis with layering contrast still visualized following CT angiogram study 2 weeks ago. 6. Focal transition of obstructed ureter at the pelvic inlet. No discrete mass or stone is evident. This may be secondary to surrounding inflammation. Urology consultation is recommended. 7. Prostate seeds.   Electronically Signed   By:  Lawrence Santiago M.D.   On: 07/26/2014 11:45    Labs:  CBC:  Recent Labs  07/18/14 1541 07/26/14 1051 07/27/14 0450  07/27/14 1622 07/27/14 2200 07/28/14 0438 07/28/14 0925  WBC 7.8 7.7 5.0  --   --   --  8.3  --   HGB 11.3* 10.4* 7.5*  < > 7.7* 7.8* 8.1* 7.3*  HCT 33.3* 30.2* 21.9*  < > 22.4* 22.9* 23.9* 21.6*  PLT 350 208 165  --   --   --  178  --   < > = values in this interval not displayed.  COAGS:  Recent Labs  09/27/13 0047 07/26/14 1051  INR 0.98 1.16  APTT 27  --     BMP:  Recent Labs  07/18/14 1541 07/26/14 1051 07/27/14 0450 07/28/14 0438  NA 135* 131* 133* 135*  K 4.3 4.5 4.6 4.5  CL 100 97 102 102  CO2 24 20 23 20   GLUCOSE 113* 135* 96 190*  BUN 16 19 17 19   CALCIUM 8.8 9.2 8.2* 8.3*  CREATININE 1.01 1.64* 1.80* 1.88*  GFRNONAA 72* 40* 36* 34*  GFRAA 84* 47* 42* 39*    LIVER FUNCTION TESTS:  Recent Labs  10/25/13 0955 07/05/14 0506 07/18/14 1541 07/27/14 0450  BILITOT 0.96 2.0* 0.4 0.7  AST 20 19 24 13   ALT 21 22 25 9   ALKPHOS 75 44 62 47  PROT 6.9 7.1 7.3 5.6*  ALBUMIN 3.8 3.7 2.9* 2.4*   Assessment and Plan: History of PE, CTA 07/14/14 on xarelto that has been discontinued this admission.  Acute blood loss, gross hematuria with clots Request for retrievable IVC filter placement with moderate sedation Patient has been NPO, labs reviewed, Cr 1.8 will use CO2 for venogram Risks and Benefits discussed with the patient. All of the patient's questions were answered, patient is agreeable to proceed. Consent signed and in chart. Prostate cancer s/p combination radiotherapy 2009   Thank you for this interesting consult.  I greatly enjoyed meeting JUMAR GREENSTREET and look forward to participating in their care.   I spent a total of 20 minutes face to face in clinical consultation, greater than 50% of which was counseling/coordinating care   Signed: Hedy Jacob 07/28/2014,  2:13 PM

## 2014-07-29 DIAGNOSIS — E669 Obesity, unspecified: Secondary | ICD-10-CM

## 2014-07-29 LAB — BASIC METABOLIC PANEL
ANION GAP: 11 (ref 5–15)
BUN: 17 mg/dL (ref 6–23)
CALCIUM: 8.1 mg/dL — AB (ref 8.4–10.5)
CO2: 22 meq/L (ref 19–32)
Chloride: 106 mEq/L (ref 96–112)
Creatinine, Ser: 1.44 mg/dL — ABNORMAL HIGH (ref 0.50–1.35)
GFR calc non Af Amer: 47 mL/min — ABNORMAL LOW (ref >90)
GFR, EST AFRICAN AMERICAN: 55 mL/min — AB (ref >90)
Glucose, Bld: 110 mg/dL — ABNORMAL HIGH (ref 70–99)
Potassium: 4.1 mEq/L (ref 3.7–5.3)
SODIUM: 139 meq/L (ref 137–147)

## 2014-07-29 LAB — CBC
HCT: 20.5 % — ABNORMAL LOW (ref 39.0–52.0)
Hemoglobin: 6.9 g/dL — CL (ref 13.0–17.0)
MCH: 35.9 pg — AB (ref 26.0–34.0)
MCHC: 33.7 g/dL (ref 30.0–36.0)
MCV: 106.8 fL — ABNORMAL HIGH (ref 78.0–100.0)
Platelets: 162 10*3/uL (ref 150–400)
RBC: 1.92 MIL/uL — AB (ref 4.22–5.81)
RDW: 15 % (ref 11.5–15.5)
WBC: 5.6 10*3/uL (ref 4.0–10.5)

## 2014-07-29 LAB — PREPARE RBC (CROSSMATCH)

## 2014-07-29 LAB — ABO/RH: ABO/RH(D): A POS

## 2014-07-29 MED ORDER — SODIUM CHLORIDE 0.9 % IV SOLN: Freq: Once | INTRAVENOUS | Status: AC

## 2014-07-29 NOTE — Care Management Note (Signed)
CARE MANAGEMENT NOTE 07/29/2014  Patient:  Daniel Vega, Daniel Vega   Account Number:  [id number]  Date Initiated:  07/29/2014  Documentation initiated by:  Marney Doctor  Subjective/Objective Assessment:   73 yo male admitted with Hematuria     Action/Plan:   From home with spouse.   Anticipated DC Date:  07/31/2014   Anticipated DC Plan:  [place]  CM consult      Choice offered to / List presented to:             Status of service:  In process, will continue to follow Medicare Important Message given?   (If response is "NO", the following Medicare IM given date fields will be blank) Date Medicare IM given:   Medicare IM given by:   Date Additional Medicare IM given:   Additional Medicare IM given by:    Discharge Disposition:    Per UR Regulation:  Reviewed for med. necessity/level of care/duration of stay  If discussed at [place] of Stay Meetings, dates discussed:    Comments:  07/29/14 Marney Doctor RN,BSN,NCM [phone] For one unit of blood today for HGB of 6.9.  CM will follow for DC needs.

## 2014-07-29 NOTE — Progress Notes (Signed)
Subjective: Patient reports feelinng better since Dr. Jeffie Pollock changed/irrigated catheter. Not c/o flank pain.  Objective: Vital signs in last 24 hours: Temp:  [98 F (36.7 C)-98.7 F (37.1 C)] 98.6 F (37 C) (10/13 0512) Pulse Rate:  [61-77] 77 (10/13 0512) Resp:  [14-25] 16 (10/13 0512) BP: (90-118)/(48-68) 93/58 mmHg (10/13 0512) SpO2:  [98 %-100 %] 99 % (10/13 0512)  Intake/Output from previous day: 10/12 0701 - 10/13 0700 In: 551 [I.V.:551] Out: 825 [Urine:825] Intake/Output this shift: Total I/O In: -  Out: 425 [Urine:425]  Physical Exam:  Constitutional: Vital signs reviewed. WD WN in NAD   Eyes: PERRL, No scleral icterus.   Pulmonary/Chest: Normal effort Abdominal: Soft. Non-tender, non-distended, bowel sounds are normal, no masses, organomegaly, or guarding present.   Urine still bloody. Catheter irrigated w/ 500 cc NS @ am and pm visits  Lab Results:  Recent Labs  07/27/14 2200 07/28/14 0438 07/28/14 0925  HGB 7.8* 8.1* 7.3*  HCT 22.9* 23.9* 21.6*   BMET  Recent Labs  07/27/14 0450 07/28/14 0438  NA 133* 135*  K 4.6 4.5  CL 102 102  CO2 23 20  GLUCOSE 96 190*  BUN 17 19  CREATININE 1.80* 1.88*  CALCIUM 8.2* 8.3*    Recent Labs  07/26/14 1051  INR 1.16   No results found for this basename: LABURIN,  in the last 72 hours Results for orders placed during the hospital encounter of 07/14/14  CULTURE, BLOOD (ROUTINE X 2)     Status: None   Collection Time    07/14/14  7:01 PM      Result Value Ref Range Status   Specimen Description BLOOD RIGHT ARM   Final   Special Requests BOTTLES DRAWN AEROBIC AND ANAEROBIC 10CC   Final   Culture  Setup Time     Final   Value: 07/15/2014 00:36     Performed at [facility]   Culture     Final   Value: NO GROWTH 5 DAYS     Note: Culture results may be compromised due to an excessive volume of blood received in culture bottles.     Performed at [facility]   Report Status 07/21/2014  FINAL   Final  CULTURE, BLOOD (ROUTINE X 2)     Status: None   Collection Time    07/14/14  7:14 PM      Result Value Ref Range Status   Specimen Description BLOOD HAND RIGHT   Final   Special Requests BOTTLES DRAWN AEROBIC AND ANAEROBIC 5CC   Final   Culture  Setup Time     Final   Value: 07/15/2014 00:30     Performed at [facility]   Culture     Final   Value: NO GROWTH 5 DAYS     Performed at [facility]   Report Status 07/21/2014 FINAL   Final    Studies/Results: Ir Ivc Filter Plmt / S&i /img Guid/mod Sed  07/28/2014   CLINICAL DATA:  73 year old gentleman with a history of pulmonary embolism. He has been initiated on Xarelto, with subsequent hematuria.  He has been referred for IVC filter placement given that he is not a candidate at this time for anti coagulation.  EXAM: ULTRASOUND GUIDANCE FOR VASCULAR ACCESS  IVC CATHETERIZATION AND carbon dioxide VENOGRAM  IVC FILTER INSERTION  Date:  10/12/201510/09/2014 4:16 pm  FLUOROSCOPY TIME:  1 min, 6 seconds  : ANESTHESIA/SEDATION:  1.5 mg Versed, 50 mcg fentanyl  MEDICATIONS AND MEDICAL HISTORY: No additional medications.  CONTRAST:  CO2 contrast  COMPLICATIONS: None  PROCEDURE: Informed consent was obtained from the patient following explanation of the procedure, risks, benefits and alternatives. The patient understands, agrees and consents for the procedure. All questions were addressed. A time out was performed.  Maximal barrier sterile technique utilized including caps, mask, sterile gowns, sterile gloves, large sterile drape, hand hygiene, and betadine prep.  Under sterile condition and local anesthesia, right internal jugular venous access was performed with ultrasound. Over a guide wire, the IVC filter delivery sheath and inner dilator were advanced into the IVC just above the IVC bifurcation. Carbon dioxide injection was performed for CO2 IVC venogram.  IVC VENOGRAM: The IVC is patent. No evidence of thrombus, stenosis,  or occlusion. No variant venous anatomy. The renal veins are identified at the level of L1.  IVC FILTER INSERTION: Through the delivery sheath, the Bard Denali IVC filter was deployed in the infrarenal IVC at the L2 level just below the renal veins and above the IVC bifurcation. Further contrast injection was not performed.  The delivery sheath was removed and hemostasis was obtained with compression for 5 minutes. The patient tolerated the procedure well. No immediate complications.  IMPRESSION: Status post car monoxide IVC cavagram demonstrates renal vein at the level of L1.  Status post placement of IVC filter below the renal veins.  Signed,  Dulcy Fanny. Earleen Newport, DO  [facility]  [facility]   Electronically Signed   By: Corrie Mckusick D.O.   On: 07/28/2014 16:38    Assessment/Plan:   1. Gross hematuria--source may be from both bladder (post-radiation bleeding) and right kidney. On going while on antiplatelet rx recently stopped  2. Right hydro on recent CT. Elevated Cr. Will need f/u imaging. Will order repeat ct--prefer w/ contrast if Cr improves. May need cysto/right rgp   LOS: 3 days   Franchot Gallo M 07/29/2014, 6:18 AM

## 2014-07-29 NOTE — Progress Notes (Signed)
Progress Note   Daniel Vega:[id number] DOB: Sep 26, 1941 DOA: 07/26/2014 PCP: Marjorie Smolder, MD   Brief Narrative:   Daniel Vega is an 73 y.o. male with a PMH of prostate cancer diagnosed 2009 status post radiation therapy, history of intermittent gross hematuria thought to be a consequence of his prior radiation treatments, pulmonary embolism on Xarelto, who was admitted 07/26/14 with gross hematuria and flank pain. CT done on admission showed evidence of mid right ureteral obstruction and hydronephrosis.  Creatinine also found to be elevated at 1.8 (baseline creatinine 1-1.2). Urology is following.  S/P IVC filter 07/28/14.  Assessment/Plan:   Principal Problem:   Hematuria / acute blood loss anemia / chronic anticoagulation  Status post evaluation by urology.  Complicated by chronic anticoagulation.  Urology considering cystoscopy with right retrograde ureteropyelogram. Hematuria CT when renal function stable.  Hemoglobin down to 6.9 mg/dL so will give 1 unit of PRBCs today.  Lovenox discontinued. Lower extremity Dopplers negative for DVT, but ongoing superficial vein thrombosis of the left greater saphenous vein from the thigh to the ankle.  IVC filter placed 07/28/14.  Active Problems:    Cold intolerance  TSH WNL.    Grade I obesity / weight loss  Dietician consulted.    Hypercholesterolemia  Continue Lipitor.    Hypertension  Continue verapamil. Lisinopril on hold secondary to acute renal failure.    Prostate cancer  Status post combination radiotherapy 06/2008 with no evidence of disease recurrence.    Embolism, pulmonary with infarction  Unfortunately, the patient has had a 3 g drop in his hemoglobin on blood thinners.  No residual DVT noted status post Doppler studies of the lower extremities  For IVC filter placement.    Rheumatoid arthritis  Takes methotrexate weekly along with Folvite.    Hydronephrosis, right / acute renal  failure  Renal function beginning to improve.  Continue to hold Lisinopril.    DVT Prophylaxis  SCDs if dopplers negative for LE clots.  Code Status: Full. Family Communication: Wife updated at the bedside. Disposition Plan: Home when stable.   IV Access:    Peripheral IV   Procedures and diagnostic studies:   Ir Ivc Filter Plmt / S&i /img Guid/mod Sed 10/12/201: Status post car monoxide IVC cavagram demonstrates renal vein at the level of L1.  Status post placement of IVC filter below the renal veins.    Medical Consultants:    Dr. Franchot Gallo, Urology.  Dr. Corrie Mckusick, IR   Other Consultants:    None.   Anti-Infectives:    None.  Subjective:   Daniel Vega says his appetite is much better.  No dizziness, no dyspnea, chest discomfort.  He has some abdominal soreness.     Objective:    Filed Vitals:   07/28/14 1747 07/28/14 2042 07/28/14 2157 07/29/14 0512  BP: 102/49 90/48 102/50 93/58  Pulse: 71  69 77  Temp: 98.2 F (36.8 C)  98 F (36.7 C) 98.6 F (37 C)  TempSrc: Oral  Oral Oral  Resp:   16 16  Height:      Weight:      SpO2: 100%  99% 99%    Intake/Output Summary (Last 24 hours) at 07/29/14 1118 Last data filed at 07/29/14 0900  Gross per 24 hour  Intake 2212.25 ml  Output   1125 ml  Net 1087.25 ml    Exam: Gen:  NAD Cardiovascular:  RRR, No M/R/G Respiratory:  Lungs CTAB Gastrointestinal:  Abdomen softly distended, + BS GU: Foley draining blood tinged urine Extremities:  No C/E/C   Data Reviewed:    Labs: Basic Metabolic Panel:  Recent Labs Lab 07/26/14 1051 07/27/14 0450 07/28/14 0438 07/29/14 0802  NA 131* 133* 135* 139  K 4.5 4.6 4.5 4.1  CL 97 102 102 106  CO2 20 23 20 22   GLUCOSE 135* 96 190* 110*  BUN 19 17 19 17   CREATININE 1.64* 1.80* 1.88* 1.44*  CALCIUM 9.2 8.2* 8.3* 8.1*   GFR Estimated Creatinine Clearance: 47.4 ml/min (by C-G formula based on Cr of 1.44). Liver Function  Tests:  Recent Labs Lab 07/27/14 0450  AST 13  ALT 9  ALKPHOS 47  BILITOT 0.7  PROT 5.6*  ALBUMIN 2.4*   Coagulation profile  Recent Labs Lab 07/26/14 1051  INR 1.16    CBC:  Recent Labs Lab 07/26/14 1051 07/27/14 0450  07/27/14 1622 07/27/14 2200 07/28/14 0438 07/28/14 0925 07/29/14 0802  WBC 7.7 5.0  --   --   --  8.3  --  5.6  HGB 10.4* 7.5*  < > 7.7* 7.8* 8.1* 7.3* 6.9*  HCT 30.2* 21.9*  < > 22.4* 22.9* 23.9* 21.6* 20.5*  MCV 104.9* 105.3*  --   --   --  104.8*  --  106.8*  PLT 208 165  --   --   --  178  --  162  < > = values in this interval not displayed. BNP (last 3 results)  Recent Labs  07/05/14 0506  PROBNP 116.1   Microbiology No results found for this or any previous visit (from the past 240 hour(s)).   Medications:   . atorvastatin  40 mg Oral q1800  . feeding supplement (ENSURE COMPLETE)  237 mL Oral BID BM  . verapamil  240 mg Oral BID   Continuous Infusions: . sodium chloride 75 mL/hr at 07/29/14 0030    Time spent: 25 mintues.    LOS: 3 days   [facility] Pager [phone]. If unable to reach me by pager, please call my cell phone at [phone].  *Please refer to [email or web address], password [email or web address] to get updated schedule on who will round on this patient, as hospitalists switch teams weekly. If 7PM-7AM, please contact night-coverage at [email or web address], password [email or web address] for any overnight needs.  07/29/2014, 11:18 AM

## 2014-07-29 NOTE — Progress Notes (Addendum)
Patient ID: Daniel Vega, male   DOB: Oct 09, 1941, 73 y.o.   MRN: [id number]    Subjective: Daniel Vega is comfortable with the catheter.   The urine is still bloody but it appears consistent with old blood and not active bleeding.  His Hgb has drifted down to 7.3.  His last Cr was up to 1.88.   He was last irrigated at 2am with return of some clots.  ROS:  Review of Systems  Constitutional: Negative for fever.  Gastrointestinal: Negative for abdominal pain.    Anti-infectives: Anti-infectives   None      Current Facility-Administered Medications  Medication Dose Route Frequency Provider Last Rate Last Dose  . 0.9 %  sodium chloride infusion   Intravenous Continuous Oswald Hillock, MD 75 mL/hr at 07/27/14 2216    . acetaminophen (TYLENOL) tablet 500 mg  500 mg Oral Q6H PRN Oswald Hillock, MD   500 mg at 07/27/14 2029  . ALPRAZolam Duanne Moron) tablet 0.5 mg  0.5 mg Oral TID PRN Oswald Hillock, MD   0.5 mg at 07/27/14 2216  . atorvastatin (LIPITOR) tablet 40 mg  40 mg Oral q1800 Oswald Hillock, MD   40 mg at 07/28/14 1836  . feeding supplement (ENSURE COMPLETE) (ENSURE COMPLETE) liquid 237 mL  237 mL Oral BID BM Darrol Jump, RD      . morphine 2 MG/ML injection 2 mg  2 mg Intravenous Q2H PRN [name], MD   2 mg at 07/29/14 0024  . ondansetron (ZOFRAN) tablet 4 mg  4 mg Oral Q6H PRN Oswald Hillock, MD       Or  . ondansetron (ZOFRAN) injection 4 mg  4 mg Intravenous Q6H PRN Oswald Hillock, MD      . opium-belladonna (B&O SUPPRETTES) suppository 1 suppository  1 suppository Rectal Q8H PRN [name], MD   1 suppository at 07/27/14 2357  . verapamil (CALAN-SR) CR tablet 240 mg  240 mg Oral BID Oswald Hillock, MD   240 mg at 07/28/14 1113     Objective: Vital signs in last 24 hours: Temp:  [98 F (36.7 C)-98.7 F (37.1 C)] 98.6 F (37 C) (10/13 0512) Pulse Rate:  [61-77] 77 (10/13 0512) Resp:  [14-25] 16 (10/13 0512) BP: (90-118)/(48-68) 93/58 mmHg (10/13 0512) SpO2:  [98  %-100 %] 99 % (10/13 0512)  Intake/Output from previous day: 10/12 0701 - 10/13 0700 In: 551 [I.V.:551] Out: 1125 [Urine:1125] Intake/Output this shift:     Physical Exam  Constitutional: He is well-developed, well-nourished, and in no distress.  Genitourinary:  Foley draining dark urine consistent with old blood.     Lab Results:   Recent Labs  07/27/14 0450  07/28/14 0438 07/28/14 0925  WBC 5.0  --  8.3  --   HGB 7.5*  < > 8.1* 7.3*  HCT 21.9*  < > 23.9* 21.6*  PLT 165  --  178  --   < > = values in this interval not displayed. BMET  Recent Labs  07/27/14 0450 07/28/14 0438  NA 133* 135*  K 4.6 4.5  CL 102 102  CO2 23 20  GLUCOSE 96 190*  BUN 17 19  CREATININE 1.80* 1.88*  CALCIUM 8.2* 8.3*   PT/INR  Recent Labs  07/26/14 1051  LABPROT 14.9  INR 1.16   ABG No results found for this basename: PHART, PCO2, PO2, HCO3,  in the last 72 hours  Studies/Results: Ir Ivc Filter  Plmt / S&i /img Guid/mod Sed  07/28/2014   CLINICAL DATA:  73 year old gentleman with a history of pulmonary embolism. He has been initiated on Xarelto, with subsequent hematuria.  He has been referred for IVC filter placement given that he is not a candidate at this time for anti coagulation.  EXAM: ULTRASOUND GUIDANCE FOR VASCULAR ACCESS  IVC CATHETERIZATION AND carbon dioxide VENOGRAM  IVC FILTER INSERTION  Date:  10/12/201510/09/2014 4:16 pm  FLUOROSCOPY TIME:  1 min, 6 seconds  : ANESTHESIA/SEDATION:  1.5 mg Versed, 50 mcg fentanyl  MEDICATIONS AND MEDICAL HISTORY: No additional medications.  CONTRAST:  CO2 contrast  COMPLICATIONS: None  PROCEDURE: Informed consent was obtained from the patient following explanation of the procedure, risks, benefits and alternatives. The patient understands, agrees and consents for the procedure. All questions were addressed. A time out was performed.  Maximal barrier sterile technique utilized including caps, mask, sterile gowns, sterile gloves, large  sterile drape, hand hygiene, and betadine prep.  Under sterile condition and local anesthesia, right internal jugular venous access was performed with ultrasound. Over a guide wire, the IVC filter delivery sheath and inner dilator were advanced into the IVC just above the IVC bifurcation. Carbon dioxide injection was performed for CO2 IVC venogram.  IVC VENOGRAM: The IVC is patent. No evidence of thrombus, stenosis, or occlusion. No variant venous anatomy. The renal veins are identified at the level of L1.  IVC FILTER INSERTION: Through the delivery sheath, the Bard Denali IVC filter was deployed in the infrarenal IVC at the L2 level just below the renal veins and above the IVC bifurcation. Further contrast injection was not performed.  The delivery sheath was removed and hemostasis was obtained with compression for 5 minutes. The patient tolerated the procedure well. No immediate complications.  IMPRESSION: Status post car monoxide IVC cavagram demonstrates renal vein at the level of L1.  Status post placement of IVC filter below the renal veins.  Signed,  Dulcy Fanny. Earleen Newport, DO  [facility]  [facility]   Electronically Signed   By: Corrie Mckusick D.O.   On: 07/28/2014 16:38   Recent labs and progress notes reviewed.    Assessment: His urine is bloody but appears consistent with old blood and not active bleeding and this is probably from lysis of clots in the bladder.   He has progressive acute blood loss anemia and renal insufficiency.   Plan: Continue hand irrigation prn.   With the rising Cr it would probably be best to avoid IV contrast for a CT.   He is going to need cystoscopy to assess the source of bleeding and a retrograde pyelogram could be considered if he is not felt to be appropriate for a CT with contrast.   Dr. Diona Fanti will follow.   With the further decline in the Hgb it would be worthwhile to consider transfusion.   His Hgb this  morning is down to 6.9.  He will need transfusion.   I didn't realize he had a CT Abdomen/Pelvis on 10/10.   For some reason it didn't show up in the result review and I only found it in [place].   He has right hydro with hyperechoic material in the right collecting system that is most consistent with blood.   The bladder looked ok on the CT.    LOS: 3 days    Vernon Maish J 07/29/2014

## 2014-07-30 ENCOUNTER — Inpatient Hospital Stay (HOSPITAL_COMMUNITY): Payer: Medicare Other

## 2014-07-30 DIAGNOSIS — C3490 Malignant neoplasm of unspecified part of unspecified bronchus or lung: Secondary | ICD-10-CM

## 2014-07-30 LAB — CBC
HEMATOCRIT: 21.9 % — AB (ref 39.0–52.0)
HEMOGLOBIN: 7.4 g/dL — AB (ref 13.0–17.0)
MCH: 35.1 pg — AB (ref 26.0–34.0)
MCHC: 33.8 g/dL (ref 30.0–36.0)
MCV: 103.8 fL — AB (ref 78.0–100.0)
PLATELETS: 150 10*3/uL (ref 150–400)
RBC: 2.11 MIL/uL — AB (ref 4.22–5.81)
RDW: 16.5 % — ABNORMAL HIGH (ref 11.5–15.5)
WBC: 4.6 10*3/uL (ref 4.0–10.5)

## 2014-07-30 LAB — BASIC METABOLIC PANEL
Anion gap: 12 (ref 5–15)
BUN: 14 mg/dL (ref 6–23)
CALCIUM: 8.4 mg/dL (ref 8.4–10.5)
CHLORIDE: 105 meq/L (ref 96–112)
CO2: 23 mEq/L (ref 19–32)
CREATININE: 1.38 mg/dL — AB (ref 0.50–1.35)
GFR calc Af Amer: 57 mL/min — ABNORMAL LOW (ref >90)
GFR calc non Af Amer: 50 mL/min — ABNORMAL LOW (ref >90)
GLUCOSE: 92 mg/dL (ref 70–99)
Potassium: 4.3 mEq/L (ref 3.7–5.3)
Sodium: 140 mEq/L (ref 137–147)

## 2014-07-30 LAB — PREPARE RBC (CROSSMATCH)

## 2014-07-30 MED ORDER — CEFAZOLIN SODIUM-DEXTROSE 2-3 GM-% IV SOLR
2.0000 g | INTRAVENOUS | Status: AC
  Administered 2014-07-31: 2 g via INTRAVENOUS
  Filled 2014-07-30: qty 50

## 2014-07-30 MED ORDER — SODIUM CHLORIDE 0.9 % IV SOLN: Freq: Once | INTRAVENOUS | Status: AC

## 2014-07-30 MED ORDER — VITAMINS A & D EX OINT
TOPICAL_OINTMENT | CUTANEOUS | Status: AC
  Administered 2014-07-30: 5
  Filled 2014-07-30: qty 5

## 2014-07-30 MED ORDER — CEFAZOLIN (ANCEF) 1 G IV SOLR: 2.0000 g | INTRAVENOUS | Status: DC

## 2014-07-30 NOTE — Evaluation (Signed)
Physical Therapy Evaluation Patient Details Name: Daniel Vega MRN: [id number] DOB: 03/12/1941 Today's Date: 07/30/2014 SATURATION QUALIFICATIONS: (This note is used to comply with regulatory documentation for home oxygen)  Patient Saturations on Room Air at Rest = 93%  Patient Saturations on Room Air while Ambulating = 89%   History of Present Illness  73 yo male admitted with hematuria, PE-s/p IVC filter placement 07/28/14. Hx of PE, HTN, prostate cancer, laminectomy 07/2012  Clinical Impression  On eval, pt required Min assist for mobility-able to ambulate ~80 feet with rolling walker. Dyspnea 3/4 with ambulation.  Demonstrates general weakness and decreased activity tolerance. Recommend HHPT at discharge.     Follow Up Recommendations Home health PT    Equipment Recommendations   (pt has access to DME)    Recommendations for Other Services       Precautions / Restrictions Precautions Precautions: Fall Precaution Comments: SOB with activity Restrictions Weight Bearing Restrictions: No      Mobility  Bed Mobility Overal bed mobility: Needs Assistance Bed Mobility: Supine to Sit     Supine to sit: Min assist     General bed mobility comments: assist for trunk to upright. increased time.   Transfers Overall transfer level: Needs assistance Equipment used: Rolling walker (2 wheeled) Transfers: Sit to/from Stand Sit to Stand: Min guard         General transfer comment: close guard for safety. vcs safety, technique, hand placement.  Ambulation/Gait Ambulation/Gait assistance: Min guard Ambulation Distance (Feet): 80 Feet Assistive device: Rolling walker (2 wheeled) Gait Pattern/deviations: Step-through pattern;Decreased stride length     General Gait Details: slow gait speed. dyspnea 3/4 with ambulation. 1 brief standing rest break needed. tolerated fairly well.   Stairs            Wheelchair Mobility    Modified Rankin (Stroke Patients  Only)       Balance                                             Pertinent Vitals/Pain Pain Assessment: 0-10 Pain Score: 3  Pain Location: R upper chest area with deep breaths Pain Intervention(s): Monitored during session    [place] expects to be discharged to:: Private residence Living Arrangements: Spouse/significant other Available Help at Discharge: Family Type of Home: Mobile home Home Access: Stairs to enter Entrance Stairs-Rails: Psychiatric nurse of Steps: 3 Home Layout: One level Home Equipment: Environmental consultant - 2 wheels;Walker - 4 wheels      Prior Function Level of Independence: Independent               Hand Dominance        Extremity/Trunk Assessment   Upper Extremity Assessment: Generalized weakness           Lower Extremity Assessment: Generalized weakness      Cervical / Trunk Assessment: Normal  Communication   Communication: No difficulties  Cognition Arousal/Alertness: Awake/alert Behavior During Therapy: WFL for tasks assessed/performed Overall Cognitive Status: Within Functional Limits for tasks assessed                      General Comments      Exercises        Assessment/Plan    PT Assessment Patient needs continued PT services  PT Diagnosis Difficulty walking;Generalized weakness   PT Problem List Decreased  strength;Decreased activity tolerance;Decreased balance;Decreased mobility;Decreased knowledge of use of DME;Pain  PT Treatment Interventions DME instruction;Gait training;Functional mobility training;Therapeutic activities;Therapeutic exercise;Patient/family education;Balance training   PT Goals (Current goals can be found in the Care Plan section) Acute Rehab PT Goals Patient Stated Goal: to get stronger PT Goal Formulation: With patient/family Time For Goal Achievement: 08/13/14 Potential to Achieve Goals: Good    Frequency Min 3X/week   Barriers to  discharge        Co-evaluation               End of Session Equipment Utilized During Treatment: Gait belt Activity Tolerance: Patient limited by fatigue (limited by dyspnea) Patient left: in bed;with call bell/phone within reach;with family/visitor present           Time: 8832-5498 PT Time Calculation (min): 31 min   Charges:   PT Evaluation $Initial PT Evaluation Tier I: 1 Procedure PT Treatments $Gait Training: 8-22 mins   PT G Codes:          Weston Anna, MPT Pager: [phone]

## 2014-07-30 NOTE — Progress Notes (Addendum)
Patient ID: Daniel Vega, male   DOB: December 12, 1940, 73 y.o.   MRN: [id number] [facility] PROGRESS NOTE  Daniel Vega ION:[id number] DOB: [birth year] DOA: 07/26/2014 PCP: Marjorie Smolder, MD  Brief narrative: 73 y.o. male with a PMH of prostate cancer diagnosed 2009 status post radiation therapy, history of intermittent gross hematuria thought to be a consequence of his prior radiation treatments, pulmonary embolism on Xarelto who was admitted 07/26/14 with gross hematuria and flank pain. CT done on admission showed evidence of mid right ureteral obstruction and hydronephrosis. Creatinine also found to be elevated at 1.8 (baseline creatinine 1-1.2). Urology is following. S/P IVC filter 07/28/14.   Assessment/Plan:   Principal Problem:  Hematuria / acute blood loss anemia / chronic anticoagulation  Patient has been seen and evaluated by urology. Patient still has dark blood in Foley catheter which is consistent with old blood. Irrigation done this morning and urine seems to be clearing up. Hemoglobin was down to 6.9 and patient received 1 unit of PRBC transfusion on 07/29/2014. Per urology patient may need cystoscopy with right retrograde pyelogram to investigate the source of bleed. Xarelto stopped. IVC filter placed 07/29/2014. Please note that the lower extremity doppler negative for DVT, but ongoing superficial vein thrombosis of the left greater saphenous vein from the thigh to the ankle.  Active Problems:  Cold intolerance  Likely be the cause of acute blood loss anemia. TSH is within normal limits. Grade I obesity / weight loss  Dietician consulted. Hypercholesterolemia  Continue Lipitor 40 mg at bedtime. Hypertension  Continue verapamil. Lisinopril stopped because of acute renal failure. Prostate cancer  Status post combination radiotherapy 06/2008 with no evidence of disease recurrence. Embolism, pulmonary with infarction  Patient with acute blood loss anemia while on  blood thinners. Xarelto stopped and IVC filter placed 07/29/2014.  Lower extremity Doppler negative for DVT.  Rheumatoid arthritis  Takes methotrexate weekly along with Folvite. Hydronephrosis, right / acute renal failure  Acute renal failure likely secondary to combination of hydronephrosis, lisinopril. Lisinopril is on hold. Creatinine is trending down, 1.44 --> 1.38  DVT Prophylaxis  SCDs bilaterally. Patient also has IVC filter.   Code Status: Full.  Family Communication: Wife at the bedside.  Disposition Plan: Home when stable.    IV Access:   Peripheral IV Procedures and diagnostic studies:    Ir Ivc Filter Plmt / S&i /img Guid/mod Sed 10/12/201: Status post car monoxide IVC cavagram demonstrates renal vein at the level of L1. Status post placement of IVC filter below the renal veins.  Medical Consultants:   Dr. Franchot Gallo, Urology.  Dr. Corrie Mckusick, IR Other Consultants:   None. Anti-Infectives:   None.   Leisa Lenz, MD  [facility] Pager [phone]  If 7PM-7AM, please contact night-coverage [email or web address] Password [email or web address] 07/30/2014, 10:53 AM   LOS: 4 days    HPI/Subjective: No acute overnight events.  Objective: Filed Vitals:   07/29/14 2010 07/29/14 2040 07/29/14 2310 07/30/14 0550  BP: 112/50 110/69 122/62 135/87  Pulse: 74 74 77 78  Temp: 98.9 F (37.2 C) 99 F (37.2 C) 98.4 F (36.9 C) 98.1 F (36.7 C)  TempSrc: Oral Oral Oral Oral  Resp: 18 16 18 20   Height:      Weight:      SpO2:  97% 98% 96%    Intake/Output Summary (Last 24 hours) at 07/30/14 1053 Last data filed at 07/29/14 2113  Gross per 24 hour  Intake    150 ml  Output  1250 ml  Net  -1100 ml    Exam:   General:  Pt is alert, follows commands appropriately, not in acute distress  Cardiovascular: Regular rate and rhythm, S1/S2, no murmurs  Respiratory: Clear to auscultation bilaterally, no wheezing, no crackles, no rhonchi  Abdomen: Soft, non tender, non  distended, bowel sounds present; Foley catheter in place with dark blood noted in Foley.  Extremities: Trace pitting lower extremity edema, pulses DP and PT palpable bilaterally  Neuro: Grossly nonfocal  Data Reviewed: Basic Metabolic Panel:  Recent Labs Lab 07/26/14 1051 07/27/14 0450 07/28/14 0438 07/29/14 0802 07/30/14 0355  NA 131* 133* 135* 139 140  K 4.5 4.6 4.5 4.1 4.3  CL 97 102 102 106 105  CO2 20 23 20 22 23   GLUCOSE 135* 96 190* 110* 92  BUN 19 17 19 17 14   CREATININE 1.64* 1.80* 1.88* 1.44* 1.38*  CALCIUM 9.2 8.2* 8.3* 8.1* 8.4   Liver Function Tests:  Recent Labs Lab 07/27/14 0450  AST 13  ALT 9  ALKPHOS 47  BILITOT 0.7  PROT 5.6*  ALBUMIN 2.4*   No results found for this basename: LIPASE, AMYLASE,  in the last 168 hours No results found for this basename: AMMONIA,  in the last 168 hours CBC:  Recent Labs Lab 07/26/14 1051 07/27/14 0450  07/27/14 2200 07/28/14 0438 07/28/14 0925 07/29/14 0802 07/30/14 0355  WBC 7.7 5.0  --   --  8.3  --  5.6 4.6  HGB 10.4* 7.5*  < > 7.8* 8.1* 7.3* 6.9* 7.4*  HCT 30.2* 21.9*  < > 22.9* 23.9* 21.6* 20.5* 21.9*  MCV 104.9* 105.3*  --   --  104.8*  --  106.8* 103.8*  PLT 208 165  --   --  178  --  162 150  < > = values in this interval not displayed. Cardiac Enzymes: No results found for this basename: CKTOTAL, CKMB, CKMBINDEX, TROPONINI,  in the last 168 hours BNP: No components found with this basename: POCBNP,  CBG: No results found for this basename: GLUCAP,  in the last 168 hours  No results found for this or any previous visit (from the past 240 hour(s)).   Scheduled Meds: . atorvastatin  40 mg Oral q1800  . feeding supplement   237 mL Oral BID BM  . verapamil  240 mg Oral BID   Continuous Infusions: . sodium chloride 75 mL/hr at 07/30/14 [phone]

## 2014-07-30 NOTE — Progress Notes (Signed)
Subjective: Patient reports some clots that had to be irrigated last night.  Objective: Vital signs in last 24 hours: Temp:  [97.6 F (36.4 C)-99 F (37.2 C)] 97.6 F (36.4 C) (10/14 1534) Pulse Rate:  [72-88] 88 (10/14 1534) Resp:  [16-20] 20 (10/14 1534) BP: (110-135)/(50-87) 122/69 mmHg (10/14 1534) SpO2:  [96 %-100 %] 97 % (10/14 1534)  Intake/Output from previous day: 10/13 0701 - 10/14 0700 In: 630 [P.O.:600; Blood:30] Out: 1550 [Urine:1550] Intake/Output this shift: Total I/O In: 840 [P.O.:240; I.V.:600] Out: 1600 [Urine:1600]  Physical Exam:  Constitutional: Vital signs reviewed. WD WN in NAD   Eyes: PERRL, No scleral icterus.   Pulmonary/Chest: Normal effort: Extremities: No cyanosis or edema   Lab Results:  Recent Labs  07/28/14 0925 07/29/14 0802 07/30/14 0355  HGB 7.3* 6.9* 7.4*  HCT 21.6* 20.5* 21.9*   BMET  Recent Labs  07/29/14 0802 07/30/14 0355  NA 139 140  K 4.1 4.3  CL 106 105  CO2 22 23  GLUCOSE 110* 92  BUN 17 14  CREATININE 1.44* 1.38*  CALCIUM 8.1* 8.4   No results found for this basename: LABPT, INR,  in the last 72 hours No results found for this basename: LABURIN,  in the last 72 hours Results for orders placed during the hospital encounter of 07/14/14  CULTURE, BLOOD (ROUTINE X 2)     Status: None   Collection Time    07/14/14  7:01 PM      Result Value Ref Range Status   Specimen Description BLOOD RIGHT ARM   Final   Special Requests BOTTLES DRAWN AEROBIC AND ANAEROBIC 10CC   Final   Culture  Setup Time     Final   Value: 07/15/2014 00:36     Performed at [facility]   Culture     Final   Value: NO GROWTH 5 DAYS     Note: Culture results may be compromised due to an excessive volume of blood received in culture bottles.     Performed at [facility]   Report Status 07/21/2014 FINAL   Final  CULTURE, BLOOD (ROUTINE X 2)     Status: None   Collection Time    07/14/14  7:14 PM      Result Value Ref  Range Status   Specimen Description BLOOD HAND RIGHT   Final   Special Requests BOTTLES DRAWN AEROBIC AND ANAEROBIC 5CC   Final   Culture  Setup Time     Final   Value: 07/15/2014 00:30     Performed at [facility]   Culture     Final   Value: NO GROWTH 5 DAYS     Performed at [facility]   Report Status 07/21/2014 FINAL   Final    Studies/Results: Ct Renal Stone Study  07/30/2014   CLINICAL DATA:  Hydronephrosis of right kidney N13.30 (ICD-10-CM) and gross hematuria since 07/29/2014. Prostate cancer and left lung cancer.  EXAM: CT ABDOMEN AND PELVIS WITHOUT CONTRAST  TECHNIQUE: Multidetector CT imaging of the abdomen and pelvis was performed following the standard protocol without IV contrast.  COMPARISON:  07/26/2014 and CT chest 07/14/2014.  FINDINGS: Lower chest: Moderate-to-large right pleural effusion has increased slightly in the interval. Associated pleural thickening. Collapse/ consolidation in the right middle and right lower lobes is seen with areas of decreased attenuation (series 2, image 11), similar to the prior exam. Small left pleural effusion is new with associated compressive atelectasis in the left  lower lobe. Heart is mildly enlarged. There is decreased attenuation of the intravascular compartment, indicative of anemia. Small amount of pericardial fluid, stable. Three-vessel coronary artery calcification.  Hepatobiliary: Liver and gallbladder are unremarkable. No biliary ductal dilatation.  Pancreas: Negative.  Spleen: Negative.  Adrenals/Urinary Tract: Adrenal glands are unremarkable. Tiny punctate stones are seen in the right kidney. Persistent moderate right hydronephrosis with abrupt transition in the mid right ureter. Mild periureteric stranding is seen in association. Remainder of the right ureter is decompressed. Left kidney is unremarkable. Left ureter is decompressed. Foley catheter is seen in a decompressed bladder.  Stomach/Bowel: Stomach and small  bowel are unremarkable. Appendix is not readily visualized. Colon is unremarkable.  Vascular/Lymphatic: Atherosclerotic calcification of the arterial vasculature without abdominal aortic aneurysm. No pathologically enlarged lymph nodes.  Reproductive: Brachytherapy seeds are seen in the prostate.  Other: No free fluid.  Mesenteries and peritoneum are unremarkable.  Musculoskeletal: No worrisome lytic or sclerotic lesions. Degenerative changes are seen in the hips, right greater than left, as well as spine.  IMPRESSION: 1. Persistent moderate right hydronephrosis with abrupt transition in the mid right ureter, worrisome for an obstructing urothelial carcinoma. 2. Moderate to large right pleural effusion, slightly increased in the interval, with heterogeneous collapse/consolidation in the right middle and right lower lobes, indicative of evolving pulmonary infarction. Superimposed pneumonia and empyema cannot be excluded. 3. Small left pleural effusion is new. 4. Three-vessel coronary artery calcification. 5. Tiny punctate stones in the right kidney.   Electronically Signed   By: Lorin Picket M.D.   On: 07/30/2014 11:46    CT was performed today. This revealed persistent right hydronephrosis down to the right mid ureter. There is a significant right pleural effusion. He seems to be asymptomatic from this, however. Assessment/Plan:   Persistent right hydronephrosis-rule out ureteral abnormality    Gross hematuria, resolving.    At this point, as he has persistent right-sided hydro-, I would recommend cystoscopy, right retrograde ureteropyelogram, possible ureteroscopy, fulguration of any bladder bleeders. The procedure was discussed with the patient's wife. I will get that scheduled for the morning tomorrow.   LOS: 4 days   Franchot Gallo M 07/30/2014, 6:01 PM

## 2014-07-31 ENCOUNTER — Encounter (HOSPITAL_COMMUNITY)
Admission: EM | Disposition: A | Discharge status: Home Health | Payer: Self-pay | Source: Home / Self Care | Attending: Internal Medicine

## 2014-07-31 ENCOUNTER — Encounter (HOSPITAL_COMMUNITY): Payer: Medicare Other | Admitting: Anesthesiology

## 2014-07-31 ENCOUNTER — Encounter (HOSPITAL_COMMUNITY): Payer: Self-pay | Admitting: Certified Registered Nurse Anesthetist

## 2014-07-31 ENCOUNTER — Inpatient Hospital Stay (HOSPITAL_COMMUNITY): Payer: Medicare Other | Admitting: Anesthesiology

## 2014-07-31 HISTORY — PX: CYSTOSCOPY WITH RETROGRADE PYELOGRAM, URETEROSCOPY AND STENT PLACEMENT: SHX5789

## 2014-07-31 LAB — TYPE AND SCREEN
ABO/RH(D): A POS
Antibody Screen: NEGATIVE
UNIT DIVISION: 0
Unit division: 0

## 2014-07-31 LAB — CBC
HEMATOCRIT: 27.1 % — AB (ref 39.0–52.0)
Hemoglobin: 9.1 g/dL — ABNORMAL LOW (ref 13.0–17.0)
MCH: 33.8 pg (ref 26.0–34.0)
MCHC: 33.6 g/dL (ref 30.0–36.0)
MCV: 100.7 fL — ABNORMAL HIGH (ref 78.0–100.0)
Platelets: 161 10*3/uL (ref 150–400)
RBC: 2.69 MIL/uL — ABNORMAL LOW (ref 4.22–5.81)
RDW: 18.3 % — ABNORMAL HIGH (ref 11.5–15.5)
WBC: 5.2 10*3/uL (ref 4.0–10.5)

## 2014-07-31 SURGERY — CYSTOURETEROSCOPY, WITH RETROGRADE PYELOGRAM AND STENT INSERTION
Anesthesia: General | Laterality: Right

## 2014-07-31 MED ORDER — SODIUM CHLORIDE 0.9 % IV SOLN
10.0000 mg | INTRAVENOUS | Status: DC | PRN
  Administered 2014-07-31: 40 ug/min via INTRAVENOUS

## 2014-07-31 MED ORDER — LIDOCAINE HCL (CARDIAC) 20 MG/ML IV SOLN
INTRAVENOUS | Status: AC
  Filled 2014-07-31: qty 5

## 2014-07-31 MED ORDER — FENTANYL CITRATE 0.05 MG/ML IJ SOLN
INTRAMUSCULAR | Status: AC
  Filled 2014-07-31: qty 2

## 2014-07-31 MED ORDER — FENTANYL CITRATE 0.05 MG/ML IJ SOLN
INTRAMUSCULAR | Status: DC | PRN
  Administered 2014-07-31: 50 ug via INTRAVENOUS
  Administered 2014-07-31: 25 ug via INTRAVENOUS
  Administered 2014-07-31 (×2): 50 ug via INTRAVENOUS
  Administered 2014-07-31: 25 ug via INTRAVENOUS
  Administered 2014-07-31: 50 ug via INTRAVENOUS

## 2014-07-31 MED ORDER — LACTATED RINGERS IV SOLN
INTRAVENOUS | Status: DC | PRN
  Administered 2014-07-31: 07:00:00 via INTRAVENOUS

## 2014-07-31 MED ORDER — SODIUM CHLORIDE 0.9 % IV SOLN
INTRAVENOUS | Status: DC | PRN
  Administered 2014-07-31: 07:00:00 via INTRAVENOUS

## 2014-07-31 MED ORDER — DEXAMETHASONE SODIUM PHOSPHATE 10 MG/ML IJ SOLN
INTRAMUSCULAR | Status: AC
  Filled 2014-07-31: qty 1

## 2014-07-31 MED ORDER — LIDOCAINE HCL (CARDIAC) 20 MG/ML IV SOLN
INTRAVENOUS | Status: DC | PRN
  Administered 2014-07-31: 50 mg via INTRAVENOUS

## 2014-07-31 MED ORDER — PROPOFOL 10 MG/ML IV BOLUS
INTRAVENOUS | Status: DC | PRN
  Administered 2014-07-31: 115 mg via INTRAVENOUS
  Administered 2014-07-31: 20 mg via INTRAVENOUS

## 2014-07-31 MED ORDER — ONDANSETRON HCL 4 MG/2ML IJ SOLN
INTRAMUSCULAR | Status: AC
  Filled 2014-07-31: qty 2

## 2014-07-31 MED ORDER — MIDAZOLAM HCL 5 MG/5ML IJ SOLN
INTRAMUSCULAR | Status: DC | PRN
  Administered 2014-07-31: .125 mg via INTRAVENOUS

## 2014-07-31 MED ORDER — LACTATED RINGERS IV SOLN: INTRAVENOUS | Status: DC | PRN

## 2014-07-31 MED ORDER — MIDAZOLAM HCL 2 MG/2ML IJ SOLN
INTRAMUSCULAR | Status: AC
  Filled 2014-07-31: qty 2

## 2014-07-31 MED ORDER — SODIUM CHLORIDE 0.9 % IJ SOLN
INTRAMUSCULAR | Status: AC
  Filled 2014-07-31: qty 10

## 2014-07-31 MED ORDER — ONDANSETRON HCL 4 MG/2ML IJ SOLN
INTRAMUSCULAR | Status: DC | PRN
  Administered 2014-07-31 (×2): 2 mg via INTRAVENOUS

## 2014-07-31 MED ORDER — ONDANSETRON HCL 4 MG/2ML IJ SOLN: 4.0000 mg | Freq: Once | INTRAMUSCULAR | Status: AC | PRN

## 2014-07-31 MED ORDER — STERILE WATER FOR IRRIGATION IR SOLN
Status: DC | PRN
  Administered 2014-07-31: 4000 mL via INTRAVESICAL

## 2014-07-31 MED ORDER — ATROPINE SULFATE 0.4 MG/ML IJ SOLN
INTRAMUSCULAR | Status: AC
  Filled 2014-07-31: qty 1

## 2014-07-31 MED ORDER — EPHEDRINE SULFATE 50 MG/ML IJ SOLN
INTRAMUSCULAR | Status: AC
  Filled 2014-07-31: qty 1

## 2014-07-31 MED ORDER — CIPROFLOXACIN HCL 250 MG PO TABS
250.0000 mg | ORAL_TABLET | Freq: Two times a day (BID) | ORAL | Status: AC
  Administered 2014-07-31 – 2014-08-02 (×6): 250 mg via ORAL
  Filled 2014-07-31 (×7): qty 1

## 2014-07-31 MED ORDER — PROPOFOL 10 MG/ML IV BOLUS
INTRAVENOUS | Status: AC
  Filled 2014-07-31: qty 20

## 2014-07-31 MED ORDER — IOHEXOL 300 MG/ML  SOLN
INTRAMUSCULAR | Status: DC | PRN
  Administered 2014-07-31: 15 mL

## 2014-07-31 MED ORDER — DEXAMETHASONE SODIUM PHOSPHATE 10 MG/ML IJ SOLN
INTRAMUSCULAR | Status: DC | PRN
  Administered 2014-07-31: 10 mg via INTRAVENOUS

## 2014-07-31 MED ORDER — CEFAZOLIN SODIUM-DEXTROSE 2-3 GM-% IV SOLR
INTRAVENOUS | Status: AC
  Filled 2014-07-31: qty 50

## 2014-07-31 MED ORDER — FENTANYL CITRATE 0.05 MG/ML IJ SOLN
INTRAMUSCULAR | Status: AC
  Filled 2014-07-31: qty 5

## 2014-07-31 MED ORDER — FENTANYL CITRATE 0.05 MG/ML IJ SOLN
25.0000 ug | INTRAMUSCULAR | Status: DC | PRN
  Administered 2014-07-31 (×3): 25 ug via INTRAVENOUS

## 2014-07-31 MED ORDER — SODIUM CHLORIDE 0.9 % IR SOLN
Status: DC | PRN
  Administered 2014-07-31: 2000 mL via INTRAVESICAL

## 2014-07-31 SURGICAL SUPPLY — 22 items
BAG URINE LEG 500ML (DRAIN) ×2 IMPLANT
BAG URO CATCHER STRL LF (DRAPE) ×3 IMPLANT
BASKET STONE NCOMPASS (UROLOGICAL SUPPLIES) ×2 IMPLANT
BASKET STONE NITINOL 3FRX115MB (UROLOGICAL SUPPLIES) ×2 IMPLANT
CATH HEMA 3WAY 30CC 22FR COUDE (CATHETERS) ×2 IMPLANT
CATH INTERMIT  6FR 70CM (CATHETERS) ×3 IMPLANT
CLOTH BEACON ORANGE TIMEOUT ST (SAFETY) ×3 IMPLANT
DRAPE CAMERA CLOSED 9X96 (DRAPES) ×3 IMPLANT
GLOVE BIOGEL M 8.0 STRL (GLOVE) ×3 IMPLANT
GOWN STRL REUS W/TWL XL LVL3 (GOWN DISPOSABLE) ×3 IMPLANT
GUIDEWIRE STR DUAL SENSOR (WIRE) ×3 IMPLANT
LOOPS RESECTOSCOPE DISP (ELECTROSURGICAL) ×2 IMPLANT
MANIFOLD NEPTUNE II (INSTRUMENTS) ×3 IMPLANT
PACK CYSTO (CUSTOM PROCEDURE TRAY) ×3 IMPLANT
SET IRRIG Y TYPE TUR BLADDER L (SET/KITS/TRAYS/PACK) ×2 IMPLANT
SHEATH ACCESS URETERAL 24CM (SHEATH) ×2 IMPLANT
SHEATH ACCESS URETERAL 38CM (SHEATH) ×3 IMPLANT
STENT CONTOUR 7FRX24 (STENTS) ×2 IMPLANT
SYRINGE IRR TOOMEY STRL 70CC (SYRINGE) ×2 IMPLANT
TUBING CONNECTING 10 (TUBING) ×2 IMPLANT
TUBING CONNECTING 10' (TUBING) ×1
WIRE COONS/BENSON .038X145CM (WIRE) ×2 IMPLANT

## 2014-07-31 NOTE — Progress Notes (Signed)
PT Cancellation Note  Patient Details Name: Daniel Vega MRN: [id number] DOB: 1941/09/10   Cancelled Treatment:    Reason Eval/Treat Not Completed: Patient at procedure or test/unavailable this am. Pt declined participation with PT this pm-resting. Will check back on tomorrow.    Weston Anna, MPT Pager: [phone]

## 2014-07-31 NOTE — Anesthesia Procedure Notes (Signed)
Procedure Name: LMA Insertion Date/Time: 07/31/2014 8:17 AM Performed by: Ofilia Neas Pre-anesthesia Checklist: Patient identified, Emergency Drugs available, Suction available, Patient being monitored and Timeout performed Patient Re-evaluated:Patient Re-evaluated prior to inductionOxygen Delivery Method: Circle system utilized Preoxygenation: Pre-oxygenation with 100% oxygen Intubation Type: IV induction LMA: LMA inserted LMA Size: 4.0 Number of attempts: 1 Placement Confirmation: positive ETCO2 Tube secured with: Tape Dental Injury: Teeth and Oropharynx as per pre-operative assessment

## 2014-07-31 NOTE — Anesthesia Preprocedure Evaluation (Addendum)
Anesthesia Evaluation  Patient identified by MRN, date of birth, ID band Patient awake    Reviewed: Allergy & Precautions, H&P , NPO status , Patient's Chart, lab work & pertinent test results  History of Anesthesia Complications Negative for: history of anesthetic complications  Airway Mallampati: II TM Distance: >3 FB Neck ROM: Full    Dental  (+) Edentulous Upper, Edentulous Lower   Pulmonary former smoker, PE Hx of lung cancer s/p resection, hx of PE initially on xarelto but s/p IVC filter placement secondary to active bleed  breath sounds clear to auscultation  Pulmonary exam normal       Cardiovascular hypertension, Pt. on medications + CAD Rhythm:Regular Rate:Normal  Prior to PE end of September of this year, patient reports good exercise tolerance with METs >4, denies stent, denies any chest pain, SOB, palpitations, peripheral edema, EF 60%   Neuro/Psych negative neurological ROS  negative psych ROS   GI/Hepatic negative GI ROS, Neg liver ROS,   Endo/Other  negative endocrine ROS  Renal/GU Renal disease  negative genitourinary   Musculoskeletal  (+) Arthritis -, Rheumatoid disorders and on steriods ,    Abdominal   Peds negative pediatric ROS (+)  Hematology  (+) anemia ,   Anesthesia Other Findings Hx of prostate cancer  Reproductive/Obstetrics negative OB ROS                         Anesthesia Physical Anesthesia Plan  ASA: III  Anesthesia Plan: General   Post-op Pain Management:    Induction: Intravenous  Airway Management Planned: LMA  Additional Equipment:   Intra-op Plan:   Post-operative Plan: Extubation in OR and Possible Post-op intubation/ventilation  Informed Consent: I have reviewed the patients History and Physical, chart, labs and discussed the procedure including the risks, benefits and alternatives for the proposed anesthesia with the patient or authorized  representative who has indicated his/her understanding and acceptance.   Dental advisory given  Plan Discussed with: CRNA  Anesthesia Plan Comments:        Anesthesia Quick Evaluation

## 2014-07-31 NOTE — Progress Notes (Signed)
Day of Surgery Subjective: Patient reports that his urine has looked a bit more bloody. He is not having pain.  Objective: Vital signs in last 24 hours: Temp:  [97.6 F (36.4 C)-99 F (37.2 C)] 98.7 F (37.1 C) (10/15 0453) Pulse Rate:  [68-88] 78 (10/15 0453) Resp:  [18-20] 20 (10/15 0453) BP: (122-143)/(60-69) 129/69 mmHg (10/15 0453) SpO2:  [93 %-100 %] 93 % (10/15 0453)  Intake/Output from previous day: 10/14 0701 - 10/15 0700 In: 5684.5 [P.O.:665; I.V.:4349.5; Blood:670] Out: 3700 [Urine:3700] Intake/Output this shift:    Physical Exam:    Lab Results:  Recent Labs  07/29/14 0802 07/30/14 0355 07/31/14 0540  HGB 6.9* 7.4* 9.1*  HCT 20.5* 21.9* 27.1*   BMET  Recent Labs  07/29/14 0802 07/30/14 0355  NA 139 140  K 4.1 4.3  CL 106 105  CO2 22 23  GLUCOSE 110* 92  BUN 17 14  CREATININE 1.44* 1.38*  CALCIUM 8.1* 8.4   No results found for this basename: LABPT, INR,  in the last 72 hours No results found for this basename: LABURIN,  in the last 72 hours Results for orders placed during the hospital encounter of 07/14/14  CULTURE, BLOOD (ROUTINE X 2)     Status: None   Collection Time    07/14/14  7:01 PM      Result Value Ref Range Status   Specimen Description BLOOD RIGHT ARM   Final   Special Requests BOTTLES DRAWN AEROBIC AND ANAEROBIC 10CC   Final   Culture  Setup Time     Final   Value: 07/15/2014 00:36     Performed at [facility]   Culture     Final   Value: NO GROWTH 5 DAYS     Note: Culture results may be compromised due to an excessive volume of blood received in culture bottles.     Performed at [facility]   Report Status 07/21/2014 FINAL   Final  CULTURE, BLOOD (ROUTINE X 2)     Status: None   Collection Time    07/14/14  7:14 PM      Result Value Ref Range Status   Specimen Description BLOOD HAND RIGHT   Final   Special Requests BOTTLES DRAWN AEROBIC AND ANAEROBIC 5CC   Final   Culture  Setup Time     Final    Value: 07/15/2014 00:30     Performed at [facility]   Culture     Final   Value: NO GROWTH 5 DAYS     Performed at [facility]   Report Status 07/21/2014 FINAL   Final    Studies/Results: Ct Renal Stone Study  07/30/2014   CLINICAL DATA:  Hydronephrosis of right kidney N13.30 (ICD-10-CM) and gross hematuria since 07/29/2014. Prostate cancer and left lung cancer.  EXAM: CT ABDOMEN AND PELVIS WITHOUT CONTRAST  TECHNIQUE: Multidetector CT imaging of the abdomen and pelvis was performed following the standard protocol without IV contrast.  COMPARISON:  07/26/2014 and CT chest 07/14/2014.  FINDINGS: Lower chest: Moderate-to-large right pleural effusion has increased slightly in the interval. Associated pleural thickening. Collapse/ consolidation in the right middle and right lower lobes is seen with areas of decreased attenuation (series 2, image 11), similar to the prior exam. Small left pleural effusion is new with associated compressive atelectasis in the left lower lobe. Heart is mildly enlarged. There is decreased attenuation of the intravascular compartment, indicative of anemia. Small amount of pericardial fluid, stable.  Three-vessel coronary artery calcification.  Hepatobiliary: Liver and gallbladder are unremarkable. No biliary ductal dilatation.  Pancreas: Negative.  Spleen: Negative.  Adrenals/Urinary Tract: Adrenal glands are unremarkable. Tiny punctate stones are seen in the right kidney. Persistent moderate right hydronephrosis with abrupt transition in the mid right ureter. Mild periureteric stranding is seen in association. Remainder of the right ureter is decompressed. Left kidney is unremarkable. Left ureter is decompressed. Foley catheter is seen in a decompressed bladder.  Stomach/Bowel: Stomach and small bowel are unremarkable. Appendix is not readily visualized. Colon is unremarkable.  Vascular/Lymphatic: Atherosclerotic calcification of the arterial vasculature without  abdominal aortic aneurysm. No pathologically enlarged lymph nodes.  Reproductive: Brachytherapy seeds are seen in the prostate.  Other: No free fluid.  Mesenteries and peritoneum are unremarkable.  Musculoskeletal: No worrisome lytic or sclerotic lesions. Degenerative changes are seen in the hips, right greater than left, as well as spine.  IMPRESSION: 1. Persistent moderate right hydronephrosis with abrupt transition in the mid right ureter, worrisome for an obstructing urothelial carcinoma. 2. Moderate to large right pleural effusion, slightly increased in the interval, with heterogeneous collapse/consolidation in the right middle and right lower lobes, indicative of evolving pulmonary infarction. Superimposed pneumonia and empyema cannot be excluded. 3. Small left pleural effusion is new. 4. Three-vessel coronary artery calcification. 5. Tiny punctate stones in the right kidney.   Electronically Signed   By: Lorin Picket M.D.   On: 07/30/2014 11:46    Assessment/Plan:   Persistent gross hematuria, right hydronephrosis    We will proceed with cystoscopy, possible fulgurations, retrograde ureteropyelogram, possible ureteroscopy.   LOS: 5 days   Franchot Gallo M 07/31/2014, 7:58 AM

## 2014-07-31 NOTE — Progress Notes (Signed)
Patient was transfused 1 unit of PRBC last night. Patient tolerated same. Urine now very bloody.

## 2014-07-31 NOTE — Progress Notes (Addendum)
Patient ID: Daniel Vega, male   DOB: June 03, 1941, 73 y.o.   MRN: [id number] [facility] PROGRESS NOTE  LEVONE OTTEN FIE:[id number] DOB: 01-Sep-1941 DOA: 07/26/2014 PCP: Marjorie Smolder, MD  Brief narrative: 73 y.o. male with a PMH of prostate cancer diagnosed 2009 status post radiation therapy, history of intermittent gross hematuria thought to be a consequence of his prior radiation treatments, pulmonary embolism on Xarelto who was admitted 07/26/14 with gross hematuria and flank pain. CT done on admission showed evidence of mid right ureteral obstruction and hydronephrosis. Creatinine also found to be elevated at 1.8 (baseline creatinine 1-1.2). Throughout the hospital stay, patient continued to have hematuria. Xarelto was stopped and patient underwent IVC filter placement 07/28/2014. He also had 2 units PRBC transfusion throughout the hospital stay. Repeat CT scan 07/30/2014 shows ongoing right side hydronephrosis and per GU pt will undergo cystoscopy this am for further evaluation of bleeding.   Assessment/Plan:   Principal Problem:  Hematuria / acute blood loss anemia / chronic anticoagulation  Patient has been seen and evaluated by urology. Patient continues to have hematuria and hydronephrosis (hydronephrosis seen on CT scan 07/30/2014). Plan for cystoscopy this am. Patient has received so far 2 units of PRBC transfusion since admission. He has ongoing hematuria. Xarelto was stopped. IVC filter placed 07/28/2014.  Please note that the lower extremity doppler negative for DVT, but ongoing superficial vein thrombosis of the left greater saphenous vein from the thigh to the ankle.  Active Problems:  Cold intolerance  Resolved now but was likely from acute blood loss anemia. TSH is within normal limits. Grade I obesity / weight loss  Dietician consulted. Hypercholesterolemia  Continue Lipitor 40 mg at bedtime. Hypertension  Continue verapamil. Lisinopril stopped because of acute  renal failure. Prostate cancer  Status post combination radiotherapy 06/2008 with no evidence of disease recurrence. Embolism, pulmonary with infarction  Patient with acute blood loss anemia while on blood thinners. Xarelto stopped and IVC filter placed 07/28/2014.  Lower extremity Doppler negative for DVT.  Rheumatoid arthritis  Takes methotrexate weekly along with Folvite. Hydronephrosis, right / acute renal failure  Acute renal failure likely secondary to combination of hydronephrosis, lisinopril.  Lisinopril is on hold.  Creatinine is trending down, 1.44 --> 1.38  DVT Prophylaxis  SCDs bilaterally. Patient also has IVC filter.   Code Status: Full.  Family Communication: Wife at the bedside.  Disposition Plan: Home when stable.    IV Access:   Peripheral IV Procedures and diagnostic studies:    Ir Ivc Filter Plmt / S&i /img Guid/mod Sed 10/12/201: Status post car monoxide IVC cavagram demonstrates renal vein at the level of L1. Status post placement of IVC filter below the renal veins  Ct Renal Stone Study 07/30/2014  1. Persistent moderate right hydronephrosis with abrupt transition in the mid right ureter, worrisome for an obstructing urothelial carcinoma. 2. Moderate to large right pleural effusion, slightly increased in the interval, with heterogeneous collapse/consolidation in the right middle and right lower lobes, indicative of evolving pulmonary infarction. Superimposed pneumonia and empyema cannot be excluded. 3. Small left pleural effusion is new. 4. Three-vessel coronary artery calcification. 5. Tiny punctate stones in the right kidney.    Cystoscopy 07/31/2014 Medical Consultants:   Dr. Franchot Gallo, Urology.  Dr. Corrie Mckusick, IR Other Consultants:   None. Anti-Infectives:   None.   Leisa Lenz, MD  [facility] Pager [phone]  If 7PM-7AM, please contact night-coverage [email or web address] Password [email or web address] 07/31/2014, 1:22 PM   LOS: 5 days  HPI/Subjective: No acute overnight events.  Objective: Filed Vitals:   07/31/14 1000 07/31/14 1015 07/31/14 1030 07/31/14 1043  BP: 135/71 132/71 124/69 135/80  Pulse: 75 74 73 75  Temp:   98.4 F (36.9 C) 97.9 F (36.6 C)  TempSrc:      Resp: 20 19 18 15   Height:      Weight:      SpO2: 99% 96% 97% 97%    Intake/Output Summary (Last 24 hours) at 07/31/14 1322 Last data filed at 07/31/14 1030  Gross per 24 hour  Intake 6584.5 ml  Output   3200 ml  Net 3384.5 ml    Exam:   General:  Pt is alert,  not in acute distress  Cardiovascular: Regular rate and rhythm, S1/S2, no murmurs  Respiratory: no wheezing, no crackles, no rhonchi  Abdomen: non distended, bowel sounds present  Extremities: pulses DP and PT palpable bilaterally; trace pitting edema  Neuro: Grossly nonfocal  Data Reviewed: Basic Metabolic Panel:  Recent Labs Lab 07/26/14 1051 07/27/14 0450 07/28/14 0438 07/29/14 0802 07/30/14 0355  NA 131* 133* 135* 139 140  K 4.5 4.6 4.5 4.1 4.3  CL 97 102 102 106 105  CO2 20 23 20 22 23   GLUCOSE 135* 96 190* 110* 92  BUN 19 17 19 17 14   CREATININE 1.64* 1.80* 1.88* 1.44* 1.38*  CALCIUM 9.2 8.2* 8.3* 8.1* 8.4   Liver Function Tests:  Recent Labs Lab 07/27/14 0450  AST 13  ALT 9  ALKPHOS 47  BILITOT 0.7  PROT 5.6*  ALBUMIN 2.4*   No results found for this basename: LIPASE, AMYLASE,  in the last 168 hours No results found for this basename: AMMONIA,  in the last 168 hours CBC:  Recent Labs Lab 07/27/14 0450  07/28/14 0438 07/28/14 0925 07/29/14 0802 07/30/14 0355 07/31/14 0540  WBC 5.0  --  8.3  --  5.6 4.6 5.2  HGB 7.5*  < > 8.1* 7.3* 6.9* 7.4* 9.1*  HCT 21.9*  < > 23.9* 21.6* 20.5* 21.9* 27.1*  MCV 105.3*  --  104.8*  --  106.8* 103.8* 100.7*  PLT 165  --  178  --  162 150 161  < > = values in this interval not displayed. Cardiac Enzymes: No results found for this basename: CKTOTAL, CKMB, CKMBINDEX, TROPONINI,  in the last 168  hours BNP: No components found with this basename: POCBNP,  CBG: No results found for this basename: GLUCAP,  in the last 168 hours  No results found for this or any previous visit (from the past 240 hour(s)).   Scheduled Meds: . atorvastatin  40 mg Oral q1800  . ciprofloxacin  250 mg Oral BID  . feeding supplement (ENSURE COMPLETE)  237 mL Oral BID BM  . fentaNYL      . verapamil  240 mg Oral BID   Continuous Infusions: . sodium chloride 75 mL/hr at 07/31/14 0500

## 2014-07-31 NOTE — Transfer of Care (Signed)
Immediate Anesthesia Transfer of Care Note  Patient: Daniel Vega  Procedure(s) Performed: Procedure(s): CYSTOSCOPY WITH RETROGRADE PYELOGRAM, URETEROSCOPY WITH FULGURATION AND STENT PLACEMENT (Right)  Patient Location: PACU  Anesthesia Type:General  Level of Consciousness: awake, oriented, patient cooperative, lethargic and responds to stimulation  Airway & Oxygen Therapy: Patient Spontanous Breathing and Patient connected to face mask oxygen  Post-op Assessment: Report given to PACU RN, Post -op Vital signs reviewed and stable and Patient moving all extremities  Post vital signs: Reviewed and stable  Complications: No apparent anesthesia complications

## 2014-07-31 NOTE — Care Management Note (Signed)
CARE MANAGEMENT NOTE 07/31/2014  Patient:  Daniel Vega, Daniel Vega   Account Number:  [id number]  Date Initiated:  07/29/2014  Documentation initiated by:  Marney Doctor  Subjective/Objective Assessment:   73 yo male admitted with Hematuria     Action/Plan:   From home with spouse.   Anticipated DC Date:  07/31/2014   Anticipated DC Plan:  [place]  CM consult      [facility] Choice  HOME HEALTH   Choice offered to / List presented to:  C-3 Spouse           [place].   Status of service:  In process, will continue to follow Medicare Important Message given?   (If response is "NO", the following Medicare IM given date fields will be blank) Date Medicare IM given:   Medicare IM given by:   Date Additional Medicare IM given:   Additional Medicare IM given by:    Discharge Disposition:    Per UR Regulation:  Reviewed for med. necessity/level of care/duration of stay  If discussed at [place] of Stay Meetings, dates discussed:   07/31/2014    Comments:  07/31/14 Marney Doctor RN,BSN,NCM Met with pt and wife to assess for DC needs.  Pt already receives home 02 from [facility]. Pt has a walker at home.   PT recommended HHPT and pt declines at this time.  Wife asked that we check back with them as they would talk about HHPT and decide closer to DC.  CM will continue to follow.  07/29/14 Marney Doctor RN,BSN,NCM [phone] For one unit of blood today for HGB of 6.9.  CM will follow for DC needs.

## 2014-07-31 NOTE — Anesthesia Postprocedure Evaluation (Signed)
  Anesthesia Post-op Note  Patient: Daniel Vega  Procedure(s) Performed: Procedure(s) (LRB): CYSTOSCOPY WITH RETROGRADE PYELOGRAM, URETEROSCOPY WITH FULGURATION AND STENT PLACEMENT (Right)  Patient Location: PACU  Anesthesia Type: General  Level of Consciousness: awake and alert   Airway and Oxygen Therapy: Patient Spontanous Breathing  Post-op Pain: mild  Post-op Assessment: Post-op Vital signs reviewed, Patient's Cardiovascular Status Stable, Respiratory Function Stable, Patent Airway and No signs of Nausea or vomiting  Last Vitals:  Filed Vitals:   07/31/14 0947  BP: 133/77  Pulse: 80  Temp: 36.9 C  Resp: 19    Post-op Vital Signs: stable   Complications: No apparent anesthesia complications

## 2014-08-01 ENCOUNTER — Inpatient Hospital Stay (HOSPITAL_COMMUNITY): Payer: Medicare Other

## 2014-08-01 ENCOUNTER — Encounter (HOSPITAL_COMMUNITY): Payer: Self-pay | Admitting: Urology

## 2014-08-01 DIAGNOSIS — J948 Other specified pleural conditions: Secondary | ICD-10-CM

## 2014-08-01 LAB — CBC
HEMATOCRIT: 27 % — AB (ref 39.0–52.0)
Hemoglobin: 9.3 g/dL — ABNORMAL LOW (ref 13.0–17.0)
MCH: 33.9 pg (ref 26.0–34.0)
MCHC: 34.4 g/dL (ref 30.0–36.0)
MCV: 98.5 fL (ref 78.0–100.0)
Platelets: 181 10*3/uL (ref 150–400)
RBC: 2.74 MIL/uL — ABNORMAL LOW (ref 4.22–5.81)
RDW: 17.2 % — AB (ref 11.5–15.5)
WBC: 8.5 10*3/uL (ref 4.0–10.5)

## 2014-08-01 LAB — BASIC METABOLIC PANEL
Anion gap: 11 (ref 5–15)
BUN: 16 mg/dL (ref 6–23)
CALCIUM: 8.6 mg/dL (ref 8.4–10.5)
CHLORIDE: 99 meq/L (ref 96–112)
CO2: 25 mEq/L (ref 19–32)
CREATININE: 1.08 mg/dL (ref 0.50–1.35)
GFR calc Af Amer: 77 mL/min — ABNORMAL LOW (ref >90)
GFR calc non Af Amer: 67 mL/min — ABNORMAL LOW (ref >90)
Glucose, Bld: 185 mg/dL — ABNORMAL HIGH (ref 70–99)
Potassium: 4.1 mEq/L (ref 3.7–5.3)
Sodium: 135 mEq/L — ABNORMAL LOW (ref 137–147)

## 2014-08-01 MED ORDER — TRAMADOL HCL 50 MG PO TABS: 50.0000 mg | ORAL_TABLET | Freq: Four times a day (QID) | ORAL | Status: DC | PRN

## 2014-08-01 MED ORDER — CEPHALEXIN 500 MG PO CAPS
500.0000 mg | ORAL_CAPSULE | Freq: Two times a day (BID) | ORAL | Status: DC
  Administered 2014-08-01 – 2014-08-05 (×9): 500 mg via ORAL
  Filled 2014-08-01 (×10): qty 1

## 2014-08-01 NOTE — Progress Notes (Addendum)
Patient ID: Daniel Vega, male   DOB: September 19, 1941, 73 y.o.   MRN: [id number] [facility] PROGRESS NOTE  Daniel Vega MWU:[id number] DOB: [birth year] DOA: 07/26/2014 PCP: Marjorie Smolder, MD  Brief narrative: 73 y.o. male with a PMH of prostate cancer diagnosed 2009 status post radiation therapy, history of intermittent gross hematuria thought to be a consequence of his prior radiation treatments, pulmonary embolism on Xarelto who was admitted 07/26/14 with gross hematuria and flank pain. CT done on admission showed evidence of mid right ureteral obstruction and hydronephrosis. Creatinine also found to be elevated at 1.8 (baseline creatinine 1-1.2). Throughout the hospital stay, patient continued to have hematuria. Xarelto was stopped and patient underwent IVC filter placement 07/28/2014. He also had 2 units PRBC transfusion throughout the hospital stay. Repeat CT scan 07/30/2014 showed ongoing right side hydronephrosis and patient underwent cystoscopy with retrograde pyelogram with right stent placement on 07/31/2014.  Assessment/Plan:   Principal Problem:  Hematuria / acute blood loss anemia / chronic anticoagulation  Unclear etiology of bleeding. Patient continued to have hematuria and hydronephrosis (hydronephrosis seen on CT scan on admission and on repeat scan 07/30/2014). Per GU recommendations, patient underwent cystoscopy with retrograde pyelogram and right stent placement on 07/31/2014, no subsequent complications. Patient was started on cipro 07/31/2014 which he will continue for 3 days. Because of the hematuria xarelto was stopped. Patient required IVC filter placement 07/28/2014. He has also received total of 2 units of PRBC since admission (10/13 and 10/14). Follow up CBC this am. Please note that the lower extremity doppler negative for DVT, but ongoing superficial vein thrombosis of the left greater saphenous vein from the thigh to the ankle.  Active Problems:  Cold  intolerance  Resolved now but was likely from acute blood loss anemia. TSH is within normal limits. Right pleural effusion  Seen on CT scan for renal protocol. Noted stable to slight increase in right pleural effusion. We'll obtain chest x-ray for further evaluation. Grade I obesity / weight loss  Dietician consulted. Hypercholesterolemia  Continue Lipitor 40 mg at bedtime. Hypertension  Continue verapamil. Lisinopril stopped because of acute renal failure. BP this am 136/74. Prostate cancer  Status post combination radiotherapy 06/2008 with no evidence of disease recurrence. Embolism, pulmonary with infarction  Patient with acute blood loss anemia while on blood thinners. Xarelto stopped and IVC filter placed 07/28/2014.  Lower extremity Doppler negative for DVT.  Rheumatoid arthritis  Takes methotrexate weekly along with Folvite. Hydronephrosis, right / acute renal failure  Acute renal failure likely secondary to combination of hydronephrosis, lisinopril.  Lisinopril is on hold.  Creatinine is trending down, 1.44 --> 1.38. Check BMP today. DVT Prophylaxis  SCDs bilaterally. Patient also has IVC filter.   Code Status: Full.  Family Communication: Wife at the bedside.  Disposition Plan: Home when stable. Encouraged to participate with physical therapy.   IV Access:   Peripheral IV Procedures and diagnostic studies:   Ir Ivc Filter Plmt / S&i /img Guid/mod Sed 10/12/201: Status post car monoxide IVC cavagram demonstrates renal vein at the level of L1. Status post placement of IVC filter below the renal veins  Ct Renal Stone Study 07/30/2014 1. Persistent moderate right hydronephrosis with abrupt transition in the mid right ureter, worrisome for an obstructing urothelial carcinoma. 2. Moderate to large right pleural effusion, slightly increased in the interval, with heterogeneous collapse/consolidation in the right middle and right lower lobes, indicative of evolving pulmonary  infarction. Superimposed pneumonia and empyema cannot be excluded. 3. Small  left pleural effusion is new. 4. Three-vessel coronary artery calcification. 5. Tiny punctate stones in the right kidney.  Cystoscopy with retrograde pyelogram, ureteroscopy with fulguration and stent placement (Right) 07/31/2014  Medical Consultants:   Dr. Franchot Gallo, Urology.  Dr. Corrie Mckusick, IR Other Consultants:   None. Anti-Infectives:   Cipro 07/31/2014 --> for 3 days   Leisa Lenz, MD  [facility] Pager [phone]  If 7PM-7AM, please contact night-coverage [email or web address] Password [email or web address] 08/01/2014, 6:16 AM   LOS: 6 days    HPI/Subjective: No acute overnight events.  Objective: Filed Vitals:   07/31/14 1043 07/31/14 1417 07/31/14 2034 08/01/14 0512  BP: 135/80 101/61 100/56 136/74  Pulse: 75 73 71 98  Temp: 97.9 F (36.6 C) 98.3 F (36.8 C) 98.1 F (36.7 C) 98.4 F (36.9 C)  TempSrc:  Oral Oral Oral  Resp: 15 16 16 16   Height:      Weight:      SpO2: 97% 100% 100% 98%    Intake/Output Summary (Last 24 hours) at 08/01/14 0616 Last data filed at 08/01/14 2992  Gross per 24 hour  Intake 3926.74 ml  Output   4125 ml  Net -198.26 ml    Exam:   General:  Pt is alert, not in acute distress  Cardiovascular: Regular rate and rhythm, S1/S2, no murmurs  Respiratory: no wheezing, no crackles, no rhonchi  Abdomen: Soft, non tender, non distended, bowel sounds present  Extremities: No edema, pulses DP and PT palpable bilaterally  Neuro: Grossly nonfocal  Data Reviewed: Basic Metabolic Panel:  Recent Labs Lab 07/26/14 1051 07/27/14 0450 07/28/14 0438 07/29/14 0802 07/30/14 0355  NA 131* 133* 135* 139 140  K 4.5 4.6 4.5 4.1 4.3  CL 97 102 102 106 105  CO2 20 23 20 22 23   GLUCOSE 135* 96 190* 110* 92  BUN 19 17 19 17 14   CREATININE 1.64* 1.80* 1.88* 1.44* 1.38*  CALCIUM 9.2 8.2* 8.3* 8.1* 8.4   Liver Function Tests:  Recent Labs Lab 07/27/14 0450  AST  13  ALT 9  ALKPHOS 47  BILITOT 0.7  PROT 5.6*  ALBUMIN 2.4*   No results found for this basename: LIPASE, AMYLASE,  in the last 168 hours No results found for this basename: AMMONIA,  in the last 168 hours CBC:  Recent Labs Lab 07/27/14 0450  07/28/14 0438 07/28/14 0925 07/29/14 0802 07/30/14 0355 07/31/14 0540  WBC 5.0  --  8.3  --  5.6 4.6 5.2  HGB 7.5*  < > 8.1* 7.3* 6.9* 7.4* 9.1*  HCT 21.9*  < > 23.9* 21.6* 20.5* 21.9* 27.1*  MCV 105.3*  --  104.8*  --  106.8* 103.8* 100.7*  PLT 165  --  178  --  162 150 161  < > = values in this interval not displayed. Cardiac Enzymes: No results found for this basename: CKTOTAL, CKMB, CKMBINDEX, TROPONINI,  in the last 168 hours BNP: No components found with this basename: POCBNP,  CBG: No results found for this basename: GLUCAP,  in the last 168 hours  No results found for this or any previous visit (from the past 240 hour(s)).   Scheduled Meds: . atorvastatin  40 mg Oral q1800  . ciprofloxacin  250 mg Oral BID  . feeding supplement (ENSURE COMPLETE)  237 mL Oral BID BM  . verapamil  240 mg Oral BID   Continuous Infusions: . sodium chloride 75 mL/hr at 08/01/14 [phone]

## 2014-08-01 NOTE — Progress Notes (Signed)
Physical Therapy Treatment Patient Details Name: KYMERE FULLINGTON MRN: [id number] DOB: 05-31-1941 Today's Date: 08/01/2014    History of Present Illness 73 yo male admitted with hematuria, PE-s/p IVC filter placement 07/28/14. Hx of PE, HTN, prostate cancer, laminectomy 07/2012    PT Comments    Improved performance and tolerance this session.   Follow Up Recommendations  Home health PT     Equipment Recommendations  None recommended by PT    Recommendations for Other Services       Precautions / Restrictions Precautions Precautions: Fall Precaution Comments: SOB with activity Restrictions Weight Bearing Restrictions: No    Mobility  Bed Mobility Overal bed mobility: Needs Assistance Bed Mobility: Supine to Sit     Supine to sit: HOB elevated;Min guard     General bed mobility comments:  increased time.   Transfers Overall transfer level: Needs assistance Equipment used: Rolling walker (2 wheeled) Transfers: Sit to/from Stand Sit to Stand: Min guard;From elevated surface         General transfer comment: close guard for safety. vcs safety, technique, hand placement.  Ambulation/Gait Ambulation/Gait assistance: Min guard Ambulation Distance (Feet): 150 Feet Assistive device: Rolling walker (2 wheeled) Gait Pattern/deviations: Step-through pattern;Decreased stride length     General Gait Details: slow gait speed. dyspnea 2/4 with ambulation. tolerated fairly well. O2 sats 90% RA.    Stairs            Wheelchair Mobility    Modified Rankin (Stroke Patients Only)       Balance                                    Cognition Arousal/Alertness: Awake/alert Behavior During Therapy: WFL for tasks assessed/performed Overall Cognitive Status: Within Functional Limits for tasks assessed                      Exercises      General Comments        Pertinent Vitals/Pain Pain Assessment: No/denies pain    Home Living                       Prior Function            PT Goals (current goals can now be found in the care plan section) Progress towards PT goals: Progressing toward goals    Frequency  Min 3X/week    PT Plan Current plan remains appropriate    Co-evaluation             End of Session Equipment Utilized During Treatment: Gait belt Activity Tolerance: Patient tolerated treatment well Patient left: in chair;with call bell/phone within reach;with family/visitor present     Time: 6644-0347 PT Time Calculation (min): 24 min  Charges:  $Gait Training: 23-37 mins                    G Codes:      Weston Anna, MPT Pager: [phone]

## 2014-08-01 NOTE — Procedures (Signed)
Successful US guided right thoracentesis. Yielded 667ml of blood tinged fluid. Pt tolerated procedure well. No immediate complications.  Specimen was not sent for labs. CXR ordered.  Tsosie Billing D PA-C 08/01/2014 4:01 PM

## 2014-08-01 NOTE — Care Management Note (Signed)
CARE MANAGEMENT NOTE 08/01/2014  Patient:  Daniel Vega, Daniel Vega   Account Number:  [id number]  Date Initiated:  07/29/2014  Documentation initiated by:  Marney Doctor  Subjective/Objective Assessment:   73 yo male admitted with Hematuria     Action/Plan:   From home with spouse.   Anticipated DC Date:  07/31/2014   Anticipated DC Plan:  [place]  CM consult      [facility] Choice  HOME HEALTH   Choice offered to / List presented to:  C-3 Spouse           [place].   Status of service:  In process, will continue to follow Medicare Important Message given?  YES (If response is "NO", the following Medicare IM given date fields will be blank) Date Medicare IM given:  08/01/2014 Medicare IM given by:  Marney Doctor Date Additional Medicare IM given:   Additional Medicare IM given by:    Discharge Disposition:    Per UR Regulation:  Reviewed for med. necessity/level of care/duration of stay  If discussed at [place] of Stay Meetings, dates discussed:   07/31/2014    Comments:  08/01/14 Marney Doctor RN,BSN,NCM Potential DC over weekend.  [facility] rep called to keep eye out for pt if he decides to go forward with HHPT.  Will need HHPT order if pt agrees.  07/31/14 Marney Doctor RN,BSN,NCM Met with pt and wife to assess for DC needs.  Pt already receives home 02 from [facility] and would use them for [facility] if needed. Pt has a walker at home.   PT recommended HHPT and pt declines at this time.  Wife asked that we check back with them as they would talk about HHPT and decide closer to DC.  CM will continue to follow.  07/29/14 Marney Doctor RN,BSN,NCM [phone] For one unit of blood today for HGB of 6.9.  CM will follow for DC needs.

## 2014-08-01 NOTE — Progress Notes (Addendum)
Nutrition Follow Up  INTERVENTION: Continue Regular Diet Continue Ensure Complete po BID, each supplement provides 350 kcal and 13 grams of protein RD to follow  NUTRITION DIAGNOSIS: Inadequate oral intake related to poor appetite as evidenced by wife. -met  Goal: Intake of meals and supplements to meet >90% estimated needs.-met  Monitor:  Intake, labs, weight trend  Reason for Assessment: MST and consult to assess nutritional status and needs  73 y.o. male  Admitting Dx: Hematuria  ASSESSMENT: Patient admitted with hematuria/acute blood loss anemia/chronic anticoagulation.  Hx includes prostate cancer.  10/11: Decreased appetite on and off for a month. Variable intake.  Poor prior to admit but improving currently per wife.  10/16: Patient reports eating 2 very large meals daily plus the Ensure Complete bid.  Appetite is now very good.  "I eat too much."  Height: Ht Readings from Last 1 Encounters:  07/26/14 5' 6"  (1.676 m)    Weight: Wt Readings from Last 1 Encounters:  07/26/14 187 lb 1.6 oz (84.868 kg)    Ideal Body Weight: 142 lbs  % Ideal Body Weight: 132  Wt Readings from Last 10 Encounters:  07/26/14 187 lb 1.6 oz (84.868 kg)  07/26/14 187 lb 1.6 oz (84.868 kg)  07/14/14 191 lb 12.8 oz (87 kg)  10/25/13 197 lb 1.6 oz (89.404 kg)  01/24/13 188 lb 14.4 oz (85.684 kg)  08/07/12 177 lb 8 oz (80.513 kg)  07/24/12 177 lb 14.4 oz (80.695 kg)  02/11/12 182 lb 15.7 oz (83 kg)  12/14/11 178 lb 9.6 oz (81.012 kg)  09/06/11 174 lb 1.6 oz (78.971 kg)    Usual Body Weight: 191  % Usual Body Weight: 98  BMI:  Body mass index is 30.21 kg/(m^2).  Estimated Nutritional Needs: Kcal: 1800-1900 Protein: 70-80 Fluid: >/=2L daily  Skin: intact  Diet Order: General  EDUCATION NEEDS: -No education needs identified at this time   Intake/Output Summary (Last 24 hours) at 08/01/14 1132 Last data filed at 08/01/14 0512  Gross per 24 hour  Intake 2651.74  ml  Output   2625 ml  Net  26.74 ml      Labs:   Recent Labs Lab 07/29/14 0802 07/30/14 0355 08/01/14 0730  NA 139 140 135*  K 4.1 4.3 4.1  CL 106 105 99  CO2 22 23 25   BUN 17 14 16   CREATININE 1.44* 1.38* 1.08  CALCIUM 8.1* 8.4 8.6  GLUCOSE 110* 92 185*    CBG (last 3)  No results found for this basename: GLUCAP,  in the last 72 hours  Scheduled Meds: . sodium chloride   Intravenous Once  . atorvastatin  40 mg Oral q1800  . cephALEXin  500 mg Oral Q12H  . ciprofloxacin  250 mg Oral BID  . feeding supplement (ENSURE COMPLETE)  237 mL Oral BID BM  . verapamil  240 mg Oral BID    Continuous Infusions: . sodium chloride 30 mL (08/01/14 1010)    Past Medical History  Diagnosis Date  . Hypertension   . Hypercholesteremia   . Glucose intolerance (impaired glucose tolerance)   . Kidney stones, calcium oxalate   . CAD (coronary artery disease)   . 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 Adenomatous colon polyp     Dr. Amedeo Plenty  . Prostate cancer 2009    Dr. Eulogio Ditch; tx w/ radium implants  . Lung cancer     Past Surgical History  Procedure Laterality Date  . Lobectomy  1999    left - Dr. Cyndia Bent  . Repair right testicular torsion  1962  . Cardiac catheterization  1997    Dr. Martinique  . Prostate radium implants  2009  . Lung cancer surgery  2003  . Colonoscopy  ;11/12/99;04/29/03    12/27/09  . Lumbar laminectomy/decompression microdiscectomy  08/10/2012    Procedure: LUMBAR LAMINECTOMY/DECOMPRESSION MICRODISCECTOMY 1 LEVEL;  Surgeon: Winfield Cunas, MD;  Location: [place] [facility];  Service: Neurosurgery;  Laterality: Left;  LEFT Lumbar five-sacral one microdiskectomy    Antonieta Iba, RD, LDN Clinical Inpatient Dietitian Pager:  [phone] Weekend and after hours pager:  [phone]

## 2014-08-01 NOTE — Progress Notes (Signed)
1 Day Post-Op Subjective: Patient reports no pain. No clots in urine overnight  Objective: Vital signs in last 24 hours: Temp:  [97.9 F (36.6 C)-98.4 F (36.9 C)] 98.4 F (36.9 C) (10/16 0512) Pulse Rate:  [71-98] 98 (10/16 0512) Resp:  [15-20] 16 (10/16 0512) BP: (100-136)/(56-80) 136/74 mmHg (10/16 0512) SpO2:  [96 %-100 %] 98 % (10/16 0512)  Intake/Output from previous day: 10/15 0701 - 10/16 0700 In: 3926.7 [P.O.:969.3; I.V.:2957.4] Out: 3125 [Urine:3125] Intake/Output this shift: Total I/O In: 2231.7 [P.O.:849.3; I.V.:1382.4] Out: 1500 [Urine:1500]  Physical Exam:  Constitutional: Vital signs reviewed. WD WN in NAD     Lab Results:  Recent Labs  07/29/14 0802 07/30/14 0355 07/31/14 0540  HGB 6.9* 7.4* 9.1*  HCT 20.5* 21.9* 27.1*   BMET  Recent Labs  07/29/14 0802 07/30/14 0355  NA 139 140  K 4.1 4.3  CL 106 105  CO2 22 23  GLUCOSE 110* 92  BUN 17 14  CREATININE 1.44* 1.38*  CALCIUM 8.1* 8.4   No results found for this basename: LABPT, INR,  in the last 72 hours No results found for this basename: LABURIN,  in the last 72 hours Results for orders placed during the hospital encounter of 07/14/14  CULTURE, BLOOD (ROUTINE X 2)     Status: None   Collection Time    07/14/14  7:01 PM      Result Value Ref Range Status   Specimen Description BLOOD RIGHT ARM   Final   Special Requests BOTTLES DRAWN AEROBIC AND ANAEROBIC 10CC   Final   Culture  Setup Time     Final   Value: 07/15/2014 00:36     Performed at [facility]   Culture     Final   Value: NO GROWTH 5 DAYS     Note: Culture results may be compromised due to an excessive volume of blood received in culture bottles.     Performed at [facility]   Report Status 07/21/2014 FINAL   Final  CULTURE, BLOOD (ROUTINE X 2)     Status: None   Collection Time    07/14/14  7:14 PM      Result Value Ref Range Status   Specimen Description BLOOD HAND RIGHT   Final   Special Requests  BOTTLES DRAWN AEROBIC AND ANAEROBIC 5CC   Final   Culture  Setup Time     Final   Value: 07/15/2014 00:30     Performed at [facility]   Culture     Final   Value: NO GROWTH 5 DAYS     Performed at [facility]   Report Status 07/21/2014 FINAL   Final    Studies/Results: Ct Renal Stone Study  07/30/2014   CLINICAL DATA:  Hydronephrosis of right kidney N13.30 (ICD-10-CM) and gross hematuria since 07/29/2014. Prostate cancer and left lung cancer.  EXAM: CT ABDOMEN AND PELVIS WITHOUT CONTRAST  TECHNIQUE: Multidetector CT imaging of the abdomen and pelvis was performed following the standard protocol without IV contrast.  COMPARISON:  07/26/2014 and CT chest 07/14/2014.  FINDINGS: Lower chest: Moderate-to-large right pleural effusion has increased slightly in the interval. Associated pleural thickening. Collapse/ consolidation in the right middle and right lower lobes is seen with areas of decreased attenuation (series 2, image 11), similar to the prior exam. Small left pleural effusion is new with associated compressive atelectasis in the left lower lobe. Heart is mildly enlarged. There is decreased attenuation of the intravascular compartment,  indicative of anemia. Small amount of pericardial fluid, stable. Three-vessel coronary artery calcification.  Hepatobiliary: Liver and gallbladder are unremarkable. No biliary ductal dilatation.  Pancreas: Negative.  Spleen: Negative.  Adrenals/Urinary Tract: Adrenal glands are unremarkable. Tiny punctate stones are seen in the right kidney. Persistent moderate right hydronephrosis with abrupt transition in the mid right ureter. Mild periureteric stranding is seen in association. Remainder of the right ureter is decompressed. Left kidney is unremarkable. Left ureter is decompressed. Foley catheter is seen in a decompressed bladder.  Stomach/Bowel: Stomach and small bowel are unremarkable. Appendix is not readily visualized. Colon is unremarkable.   Vascular/Lymphatic: Atherosclerotic calcification of the arterial vasculature without abdominal aortic aneurysm. No pathologically enlarged lymph nodes.  Reproductive: Brachytherapy seeds are seen in the prostate.  Other: No free fluid.  Mesenteries and peritoneum are unremarkable.  Musculoskeletal: No worrisome lytic or sclerotic lesions. Degenerative changes are seen in the hips, right greater than left, as well as spine.  IMPRESSION: 1. Persistent moderate right hydronephrosis with abrupt transition in the mid right ureter, worrisome for an obstructing urothelial carcinoma. 2. Moderate to large right pleural effusion, slightly increased in the interval, with heterogeneous collapse/consolidation in the right middle and right lower lobes, indicative of evolving pulmonary infarction. Superimposed pneumonia and empyema cannot be excluded. 3. Small left pleural effusion is new. 4. Three-vessel coronary artery calcification. 5. Tiny punctate stones in the right kidney.   Electronically Signed   By: Lorin Picket M.D.   On: 07/30/2014 11:46    Assessment/Plan:   Hematuria-he had an upper ureteral bleed, still has a clot in his renal pelvis I could not remove with ureteroscopy last night. Some of the bleeding was from his bladder base/prostate, radiation affect. His urine is now clear. Catheter was removed    Hydronephrosis, likely from benign midureteral narrowing/stricture. Stent now in place    Significant right pulmonary effusion    I removed his catheter today. We'll give him a voiding trial. From my standpoint, if he urinates fine, is okay to go home from a urologic standpoint. Would leave him an extra day to assure ourselves that he does not have recurrent bleeding, however. I will arrange followup with him. Await cytologies of right renal washings.   LOS: 6 days   Franchot Gallo M 08/01/2014, 6:37 AM

## 2014-08-01 NOTE — Op Note (Addendum)
Preoperative diagnosis: Right hydronephrosis, grossly hematuria  Postoperative diagnosis: Right ureteral stricture/narrowing in mid ureter, blood clot in right renal pelvis, prostatic bleeding  Principal procedure: Cystoscopy, right retrograde ureteropyelogram with interpretive fluoroscopy, right ureteroscopy, right renal washings, fulguration of prostatic bleeders, placement of double-J stent and right ureter  Surgeon: Angelos Wasco  Complications: None  Anesthesia: Gen. LMA  Specimen: Right renal washings for cytology  Drains: 22 French three-way hematuria catheter, 24 cm x 7 French double-J stent without tether and right ureter  Indications: 73 year-old male with persistent gross hematuria and right hydronephrosis. He does have a history of prostate cancer and prior radiotherapy for this. He has had significant hematuria, but I don't think this could cause his hemoglobin to decrease is much as it has. He presents at this time for cystoscopy, possible fulguration, right retrograde, ureteroscopy.  Procedure: The patient was identified and properly marked in the holding area. He received preoperative IV antibiotics. He was taken the operating room where general anesthetic was administered with the LMA. He was placed in the dorsolithotomy position. Genitalia and perineum were prepped and draped. Proper timeout was then performed.  A 22 French panendoscope was advanced under direct vision through his urethra. There was mild trauma in the bulbous/prostatic urethra from prior catheter related trauma. This was easily traversed with the cystoscope. There were number of to telangiectatic c vessels or bleeding in his prostatic urethra and bladder neck. The entire bladder was inspected with both the 12 and 70 lenses. No other lesions were seen. Both ureteral orifices were normal in configuration and location. The right ureteral orifice was then cannulated with a 6 [place] open-ended catheter. Right retrograde  ureteropyelogram was performed.  Omnipaque was used for this. This revealed a normal distal ureter. The mid ureter, approximately the level of L4, had a stricture. There was no filling defect. Proximal to this, there was mild dilatation of the ureter. Pyelocalyceal system was somewhat dilated. There was a filling defect in the pelvis.  I then passed a sensor-tip guidewire through the open-ended catheter,   past the stricture into the right renal pelvis were curl was seen. The cystoscope was removed. I then navigated the inner core of a 12/14 ureteral access sheath up to the level of approximately L5. As dilated the ureter. I then passed a rigid ureteroscope up the ureter. No lesions were seen. There was minimal narrowing at the level or a thought there had been radiographic evidence of his stricture. I did not see any lesions, papillary or otherwise in this area. I was able to pass the scope more proximally passes area. Again, no lesions or filling defects were seen. Once I reached the extent of the rigid scope, I passed a ureteral access catheter to the level the proximal ureter. I then passed the flexible ureteroscope into this area. The proximal ureter and renal pelvis were examined. There was a clot the renal pelvis. I was able to inspect several of the lower pole calyces which all seem to be normal. I did not see any urothelial abnormalities of the pelvis, but this clot was unable to be removed, either with irrigation through the ureteroscope or using a couple of different baskets to snare the clot. I tried inspect the remaining calyces, but this was difficult due to the size of the clot. I took washings of this area and sent them for cytology.  At this point, I removed the ureteroscope, and over top of the guidewire and using cystoscope, passed a 24 cm  x 6 French double-J stent using fluoroscopic guidance. Good proximal and distal curls were seen. No string was left.  I then used the resectoscope to  cauterize the bladder neck area until hemostasis was achieved.  At this point, the procedure was terminated. The scope was removed and I placed a 22 French hematuria catheter. The patient was awakened and taken to PACU in stable condition.

## 2014-08-02 LAB — URINALYSIS, ROUTINE W REFLEX MICROSCOPIC
BILIRUBIN URINE: NEGATIVE
Glucose, UA: NEGATIVE mg/dL
KETONES UR: NEGATIVE mg/dL
Nitrite: NEGATIVE
PH: 7 (ref 5.0–8.0)
Protein, ur: 100 mg/dL — AB
Specific Gravity, Urine: 1.011 (ref 1.005–1.030)
Urobilinogen, UA: 1 mg/dL (ref 0.0–1.0)

## 2014-08-02 LAB — URINE MICROSCOPIC-ADD ON

## 2014-08-02 MED ORDER — PHENAZOPYRIDINE HCL 200 MG PO TABS
200.0000 mg | ORAL_TABLET | Freq: Three times a day (TID) | ORAL | Status: DC
  Administered 2014-08-02 – 2014-08-04 (×6): 200 mg via ORAL
  Filled 2014-08-02 (×9): qty 1

## 2014-08-02 MED ORDER — TAMSULOSIN HCL 0.4 MG PO CAPS
0.4000 mg | ORAL_CAPSULE | Freq: Every day | ORAL | Status: DC
  Administered 2014-08-02 – 2014-08-05 (×4): 0.4 mg via ORAL
  Filled 2014-08-02 (×4): qty 1

## 2014-08-02 NOTE — Progress Notes (Signed)
Patient ID: Daniel Vega, male   DOB: Nov 07, 1940, 73 y.o.   MRN: [id number] [facility] PROGRESS NOTE  Daniel Vega [id number] DOB: 1940-11-21 DOA: 07/26/2014 PCP: Daniel Smolder, MD  Brief narrative: 73 y.o. male with a PMH of prostate cancer diagnosed 2009 status post radiation therapy, history of intermittent gross hematuria thought to be a consequence of his prior radiation treatments, pulmonary embolism on Xarelto who was admitted 07/26/14 with gross hematuria and flank pain. CT done on admission showed evidence of mid right ureteral obstruction and hydronephrosis. Creatinine also found to be elevated at 1.8 (baseline creatinine 1-1.2). Throughout the hospital stay, patient continued to have hematuria. Xarelto was stopped and patient underwent IVC filter placement 07/28/2014. He also had 2 units PRBC transfusion throughout the hospital stay. Repeat CT scan 07/30/2014 showed ongoing right side hydronephrosis and patient underwent cystoscopy with retrograde pyelogram with right stent placement on 07/31/2014. In addition, patient was noted to have right pleural effusion noted incidentally on CT scan but confirmed with CXR. US thoracentesis done 08/01/2014 with 600 blood tinged fluid removed.  Assessment/Plan:   Principal Problem:  Hematuria / acute blood loss anemia / chronic anticoagulation   Unclear etiology of bleeding. Patient continued to have hematuria and hydronephrosis (hydronephrosis seen on CT scan on admission and on repeat CT scan 07/30/2014). Per GU recommendations, patient underwent cystoscopy with retrograde pyelogram and right stent placement on 07/31/2014. Overnight patient had intermittent catheterization with 750 cc urine drained. He then was able to void on his own.   GU started Pyridium 200 mg PO TID. Continue Flomax 0.4 mg daily.  Patient was started on cipro 07/31/2014 which he will continue through today and started on Keflex 08/01/2014.  Because of the  hematuria xarelto was stopped. Patient required IVC filter placement 07/28/2014. He has also received total of 2 units of PRBC since admission (10/13 and 10/14). Post transfusion hemoglobin is 9.3.  Please note that the lower extremity doppler negative for DVT, but ongoing superficial vein thrombosis of the left greater saphenous vein from the thigh to the ankle.   Obtain CBC in am. Active Problems:  Cold intolerance   Resolved now but was likely from acute blood loss anemia. TSH is within normal limits. Right pleural effusion   Seen on CT scan for renal protocol. CXR confirmed increased right pleural effusion.  US thoracentesis done 08/01/2014 with 600 cc fluids removed. Grade I obesity / weight loss   Dietician consulted. Hypercholesterolemia   Continue Lipitor 40 mg at bedtime. Hypertension   Continue verapamil. Lisinopril stopped because of acute renal failure.   Follow up BMP tomorrow am. Prostate cancer   Status post combination radiotherapy 06/2008 with no evidence of disease recurrence. Embolism, pulmonary with infarction   Patient with acute blood loss anemia while on blood thinners. Xarelto stopped and IVC filter placed 07/28/2014.   Lower extremity Doppler negative for DVT.  Rheumatoid arthritis   Takes methotrexate weekly along with Folvite. Hydronephrosis, right / acute renal failure   Acute renal failure likely secondary to combination of hydronephrosis, lisinopril.   Lisinopril is on hold. Follow up BMP in am.  Creatinine is trending down, 1.44 --> 1.38. Check BMP today. DVT Prophylaxis   SCDs bilaterally. Patient also has IVC filter.   Code Status: Full.  Family Communication: Wife at the bedside.  Disposition Plan: Home when stable. Encouraged to participate with physical therapy.    IV Access:   Peripheral IV Procedures and diagnostic studies:   Ir Ivc Filter  Plmt / S&i /img Guid/mod Sed 10/12/201: Status post car monoxide IVC cavagram  demonstrates renal vein at the level of L1. Status post placement of IVC filter below the renal veins  Ct Renal Stone Study 07/30/2014 1. Persistent moderate right hydronephrosis with abrupt transition in the mid right ureter, worrisome for an obstructing urothelial carcinoma. 2. Moderate to large right pleural effusion, slightly increased in the interval, with heterogeneous collapse/consolidation in the right middle and right lower lobes, indicative of evolving pulmonary infarction. Superimposed pneumonia and empyema cannot be excluded. 3. Small left pleural effusion is new. 4. Three-vessel coronary artery calcification. 5. Tiny punctate stones in the right kidney.  Cystoscopy with retrograde pyelogram, ureteroscopy with fulguration and stent placement (Right) 07/31/2014 Dg Chest 1 View 08/01/2014  1. Small right pleural effusion, decreased from prior. No pneumothorax identified. 2. Persistent right basilar consolidation. US thoracentesis: 08/01/2014 - 600 cc blood tinged fluid drained. Dg Chest Port 1 View 08/01/2014  Increased right pleural effusion with associated atelectasis.   Medical Consultants:   Daniel Vega, Urology.  Daniel Vega, IR Other Consultants:   None. Anti-Infectives:   Cipro 07/31/2014 --> for 3 days Keflex 08/01/2014 -->   Daniel Lenz, MD  [facility] Pager [phone]  If 7PM-7AM, please contact night-coverage [email or web address] Password [email or web address] 08/02/2014, 11:43 AM   LOS: 7 days    HPI/Subjective: No acute overnight events.  Objective: Filed Vitals:   08/01/14 1557 08/01/14 1604 08/01/14 2105 08/02/14 0639  BP: 134/61 146/69 127/66 121/71  Pulse:   62   Temp:   98 F (36.7 C) 97.9 F (36.6 C)  TempSrc:   Oral Oral  Resp:   16 16  Height:      Weight:      SpO2:   99% 99%    Intake/Output Summary (Last 24 hours) at 08/02/14 1143 Last data filed at 08/02/14 0930  Gross per 24 hour  Intake 1991.5 ml  Output   1550 ml  Net  441.5 ml     Exam:   General:  Pt not in acute distress  Cardiovascular: Regular rate and rhythm, S1/S2, no murmurs  Respiratory: better air entry this morning, no wheezing, no crackles, no rhonchi  Abdomen: Soft, non tender, non distended, bowel sounds present  Extremities: trace pedal pitting edema, pulses DP and PT palpable bilaterally  Neuro: Grossly nonfocal  Data Reviewed: Basic Metabolic Panel:  Recent Labs Lab 07/27/14 0450 07/28/14 0438 07/29/14 0802 07/30/14 0355 08/01/14 0730  NA 133* 135* 139 140 135*  K 4.6 4.5 4.1 4.3 4.1  CL 102 102 106 105 99  CO2 23 20 22 23 25   GLUCOSE 96 190* 110* 92 185*  BUN 17 19 17 14 16   CREATININE 1.80* 1.88* 1.44* 1.38* 1.08  CALCIUM 8.2* 8.3* 8.1* 8.4 8.6   Liver Function Tests:  Recent Labs Lab 07/27/14 0450  AST 13  ALT 9  ALKPHOS 47  BILITOT 0.7  PROT 5.6*  ALBUMIN 2.4*   No results found for this basename: LIPASE, AMYLASE,  in the last 168 hours No results found for this basename: AMMONIA,  in the last 168 hours CBC:  Recent Labs Lab 07/28/14 0438 07/28/14 0925 07/29/14 0802 07/30/14 0355 07/31/14 0540 08/01/14 0730  WBC 8.3  --  5.6 4.6 5.2 8.5  HGB 8.1* 7.3* 6.9* 7.4* 9.1* 9.3*  HCT 23.9* 21.6* 20.5* 21.9* 27.1* 27.0*  MCV 104.8*  --  106.8* 103.8* 100.7* 98.5  PLT 178  --  162 150  161 181   Cardiac Enzymes: No results found for this basename: CKTOTAL, CKMB, CKMBINDEX, TROPONINI,  in the last 168 hours BNP: No components found with this basename: POCBNP,  CBG: No results found for this basename: GLUCAP,  in the last 168 hours  No results found for this or any previous visit (from the past 240 hour(s)).   Scheduled Meds: . atorvastatin  40 mg Oral q1800  . cephALEXin  500 mg Oral Q12H  . ciprofloxacin  250 mg Oral BID  . feeding supplement (ENSURE COMPLETE)  237 mL Oral BID BM  . phenazopyridine  200 mg Oral TID WC  . tamsulosin  0.4 mg Oral Daily  . verapamil  240 mg Oral BID   Continuous  Infusions: . sodium chloride 30 mL/hr at 08/02/14 [phone]

## 2014-08-02 NOTE — Progress Notes (Signed)
Patient c/o difficulty voiding and feeling of pressure in bladder. Made several attempts to void but was unable to. Bladder scan done at approximately 0140 for 532cc. On call MD made aware and order for I & O cath received. I & O cath done at approx 0215 for 750cc of blood tinged  urine. Patient tolerated procedure.

## 2014-08-02 NOTE — Progress Notes (Signed)
Urology Progress Note  2 Days Post-Op cystoscopy, irrigation of Right renal pelvic blood clot, fulgeration of prostatic bleeders, and Right JJ stent.( 07/31/14). Dr. Dorina Hoyer  Subjective: Foley out. Pt hat episode of urinary retention last night, with intermittant cath for 750cc pink-tinged uring. He then had spontaneous void of 400cc clear urine. C/o burning at the end of his penis.    Note thoracentesis yesterday, and hx of lung Ca.      . Ambulation:   negative Flatus:    positive Bowel movement  positive  Pain: some relief  Objective:  Blood pressure 121/71, pulse 62, temperature 97.9 F (36.6 C), temperature source Oral, resp. rate 16, height 5\' 6"  (1.676 m), weight 84.868 kg (187 lb 1.6 oz), SpO2 99.00%.  Physical Exam:  General:  No acute distress, awake Extremities: Homans sign is negative, no sign of DVT Genitourinary:  Normal penis and scrotum Foley:  out    I/O last 3 completed shifts: In: 3983.2 [P.O.:1649.3; I.V.:2333.9] Out: 3050 [Urine:3050]  Recent Labs     07/31/14  0540  08/01/14  0730  HGB  9.1*  9.3*  WBC  5.2  8.5  PLT  161  181    Recent Labs     08/01/14  0730  NA  135*  K  4.1  CL  99  CO2  25  BUN  16  CREATININE  1.08  CALCIUM  8.6  GFRNONAA  67*  GFRAA  77*     No results found for this basename: PT, INR, APTT,  in the last 72 hours   No components found with this basename: ABG,   Assessment/Plan: Pt appears Urologically stable. May need Flomax to help him void, and should get to bathroom to void ( rather than trying to void while lying on his back). Burinig at end of his penis may be from his JJ stent, and recent procedure. Will ask for clean-catch u/a today, and start flomax and pyridium.  Continue any current medications.

## 2014-08-03 ENCOUNTER — Inpatient Hospital Stay (HOSPITAL_COMMUNITY): Payer: Medicare Other

## 2014-08-03 LAB — CBC
HCT: 28.9 % — ABNORMAL LOW (ref 39.0–52.0)
HEMOGLOBIN: 9.6 g/dL — AB (ref 13.0–17.0)
MCH: 33.7 pg (ref 26.0–34.0)
MCHC: 33.2 g/dL (ref 30.0–36.0)
MCV: 101.4 fL — ABNORMAL HIGH (ref 78.0–100.0)
Platelets: 196 10*3/uL (ref 150–400)
RBC: 2.85 MIL/uL — AB (ref 4.22–5.81)
RDW: 16.8 % — ABNORMAL HIGH (ref 11.5–15.5)
WBC: 7 10*3/uL (ref 4.0–10.5)

## 2014-08-03 LAB — BASIC METABOLIC PANEL
Anion gap: 10 (ref 5–15)
BUN: 19 mg/dL (ref 6–23)
CO2: 28 meq/L (ref 19–32)
Calcium: 8.7 mg/dL (ref 8.4–10.5)
Chloride: 104 mEq/L (ref 96–112)
Creatinine, Ser: 1.1 mg/dL (ref 0.50–1.35)
GFR calc Af Amer: 75 mL/min — ABNORMAL LOW (ref >90)
GFR, EST NON AFRICAN AMERICAN: 65 mL/min — AB (ref >90)
Glucose, Bld: 101 mg/dL — ABNORMAL HIGH (ref 70–99)
Potassium: 4.3 mEq/L (ref 3.7–5.3)
SODIUM: 142 meq/L (ref 137–147)

## 2014-08-03 NOTE — Progress Notes (Signed)
Patient complained of chest pain at 2150. He states it was in the center of his chest, but not spreading anywhere else. Vital sign were within normal limits, EKG performed which showed NSR. Hospitalist on call paged and received call back later. At that time patient reports almost complete resolution of chest pain, and states he feels like it may have been indigestion. Will monitor patient closely throughout night.

## 2014-08-03 NOTE — Progress Notes (Signed)
Urology Progress Note  3 Days Post-Op   Subjective:73 y.o. male with a PMH of prostate cancer diagnosed 2009 status post radiation therapy, history of intermittent gross hematuria thought to be a consequence of his prior radiation treatments, pulmonary embolism on Xarelto who was admitted 07/26/14 with gross hematuria and flank pain. CT done on admission showed evidence of mid right ureteral obstruction and hydronephrosis. Creatinine also found to be elevated at 1.8 (baseline creatinine 1-1.2). Throughout the hospital stay, patient continued to have hematuria. Xarelto was stopped and patient underwent IVC filter placement 07/28/2014. He also had 2 units PRBC transfusion throughout the hospital stay. Repeat CT scan 07/30/2014 showed ongoing right side hydronephrosis and patient underwent cystoscopy with retrograde pyelogram with right stent placement on 07/31/2014 ( Dr. Dorina Hoyer). In addition, patient was noted to have right pleural effusion noted incidentally on CT scan but confirmed with CXR. US thoracentesis done 08/01/2014 with 600 blood tinged fluid removed.   Pt had episode of acute urinary retention, Rx intermittent catheterization, and started on Flomax. He has been voiding freely since. He no c/o urge and urge incontinence.       No acute urologic events overnight. Ambulation:   positive ( sitting in chair) wears O2. Flatus:    positive Bowel movement  positive  Pain: some relief  Objective:  Blood pressure 136/79, pulse 73, temperature 98 F (36.7 C), temperature source Oral, resp. rate 16, height 5\' 6"  (1.676 m), weight 84.868 kg (187 lb 1.6 oz), SpO2 99.00%.  Physical Exam:  General:  No acute distress, awake Extremities: Homans sign is negative, no sign of DVT Genitourinary:  Normal penis Foley:  out    I/O last 3 completed shifts: In: 2946.5 [P.O.:1740; I.V.:1206.5] Out: 2350 [Urine:2350]  Recent Labs     08/01/14  0730  08/03/14  0517  HGB  9.3*  9.6*  WBC  8.5  7.0   PLT  181  196    Recent Labs     08/01/14  0730  08/03/14  0517  NA  135*  142  K  4.1  4.3  CL  99  104  CO2  25  28  BUN  16  19  CREATININE  1.08  1.10  CALCIUM  8.6  8.7  GFRNONAA  67*  65*  GFRAA  77*  75*     No results found for this basename: PT, INR, APTT,  in the last 72 hours   No components found with this basename: ABG,   Assessment/Plan: New onset urge incontinence: may stabilize when body fluiod shifts equilibrate. May need P.T. Would advise holding off on anticholinergics because of side effects of (1) confusion in elderly; (2) constipation; (3) dry mouth.   Follow up in per Dr. Dorina Hoyer

## 2014-08-03 NOTE — Progress Notes (Signed)
Patient ID: Daniel Vega, male   DOB: 09/24/1941, 73 y.o.   MRN: [id number] [facility] PROGRESS NOTE  TAREZ BOWNS HBZ:[id number] DOB: 22-Oct-1940 DOA: 07/26/2014 PCP: Marjorie Smolder, MD  Brief narrative: 73 y.o. male with a PMH of prostate cancer diagnosed 2009 status post radiation therapy, history of intermittent gross hematuria thought to be a consequence of his prior radiation treatments, pulmonary embolism on Xarelto who was admitted 07/26/14 with gross hematuria and flank pain. CT done on admission showed evidence of mid right ureteral obstruction and hydronephrosis. Creatinine also found to be elevated at 1.8 (baseline creatinine 1-1.2). Throughout the hospital stay, patient continued to have hematuria. Xarelto was stopped and patient underwent IVC filter placement 07/28/2014. He also had 2 units PRBC transfusion throughout the hospital stay. Repeat CT scan 07/30/2014 showed ongoing right side hydronephrosis and patient underwent cystoscopy with retrograde pyelogram with right stent placement on 07/31/2014. In addition, patient was noted to have right pleural effusion noted incidentally on CT scan but confirmed with CXR. US thoracentesis done 08/01/2014 with 600 blood tinged fluid removed.   Assessment/Plan:   Principal Problem:  Hematuria / acute blood loss anemia / chronic anticoagulation  Unclear etiology of bleeding. Because of persistent hematuria and hydronephrosis (hydronephrosis seen on CT scan on admission and on repeat CT scan 07/30/2014) GU recommended cystoscopy with retrograde pyelogram and right stent placement which pt underwent on 07/31/2014. Pt required intermittent catheterization on 08/02/14 but since then has been able to void on his own. Continue Pyridium 200 mg PO TID. Continue Flomax 0.4 mg daily.  Patient was started on cipro 07/31/2014 for 3 days. Started on Keflex 08/01/2014. Because of the hematuria xarelto was stopped. Patient required IVC filter  placement 07/28/2014. He has also received total of 2 units of PRBC since admission (10/13 and 10/14).  Post transfusion hemoglobin remains stable at 9.6. Please note that the lower extremity doppler negative for DVT, but ongoing superficial vein thrombosis of the left greater saphenous vein from the thigh to the ankle.   Active Problems:  Cold intolerance  Resolved now but was likely from acute blood loss anemia. TSH is within normal limits. Right pleural effusion  Seen on CT scan for renal protocol. CXR confirmed increased right pleural effusion.  US thoracentesis done 08/01/2014 with 600 cc fluids removed. Repeat CXR this morning showed improvement in pleural effusion. Breathing much better today.  Grade I obesity / weight loss  Dietician consulted. Hypercholesterolemia  Continue Lipitor 40 mg at bedtime. Hypertension  Continue verapamil. Lisinopril stopped because of acute renal failure.  Prostate cancer  Status post combination radiotherapy 06/2008 with no evidence of disease recurrence. Embolism, pulmonary with infarction  Patient with acute blood loss anemia while on blood thinners. Xarelto stopped and IVC filter placed 07/28/2014.  Lower extremity Doppler negative for DVT.  Rheumatoid arthritis  Takes methotrexate weekly along with Folvite. Hydronephrosis, right / acute renal failure  Acute renal failure likely secondary to combination of hydronephrosis, lisinopril.  Lisinopril is on hold.  Creatinine is trending down, 1.44 --> 1.38 --> WNL. DVT Prophylaxis  SCDs bilaterally. Patient also has IVC filter.   Code Status: Full.  Family Communication: Wife at the bedside.  Disposition Plan: Home when stable. Encouraged to participate with physical therapy.   IV Access:   Peripheral IV Procedures and diagnostic studies:   Ir Ivc Filter Plmt / S&i /img Guid/mod Sed 10/12/201: Status post car monoxide IVC cavagram demonstrates renal vein at the level of L1. Status post placement  of IVC filter below the renal veins  Ct Renal Stone Study 07/30/2014 1. Persistent moderate right hydronephrosis with abrupt transition in the mid right ureter, worrisome for an obstructing urothelial carcinoma. 2. Moderate to large right pleural effusion, slightly increased in the interval, with heterogeneous collapse/consolidation in the right middle and right lower lobes, indicative of evolving pulmonary infarction. Superimposed pneumonia and empyema cannot be excluded. 3. Small left pleural effusion is new. 4. Three-vessel coronary artery calcification. 5. Tiny punctate stones in the right kidney.  Cystoscopy with retrograde pyelogram, ureteroscopy with fulguration and stent placement (Right) 07/31/2014  Dg Chest 1 View 08/01/2014 1. Small right pleural effusion, decreased from prior. No pneumothorax identified. 2. Persistent right basilar consolidation.  US thoracentesis: 08/01/2014 - 600 cc blood tinged fluid drained.  Dg Chest Port 1 View 08/01/2014 Increased right pleural effusion with associated atelectasis.   Dg Chest 1 View 08/01/2014   1. Small right pleural effusion, decreased from prior. No pneumothorax identified. 2. Persistent right basilar consolidation.     Dg Chest Port 1 View 08/03/2014  Improve small bilateral pleural effusions and basilar atelectasis. No new abnormality.     Medical Consultants:   Dr. Franchot Gallo, Urology.  Dr. Corrie Mckusick, IR Other Consultants:   None. Anti-Infectives:   Cipro 07/31/2014 --> for 3 days  Keflex 08/01/2014 -->   Leisa Lenz, MD  [facility] Pager [phone]  If 7PM-7AM, please contact night-coverage [email or web address] Password [email or web address] 08/03/2014, 10:26 AM   LOS: 8 days    HPI/Subjective: No acute overnight events.  Objective: Filed Vitals:   08/02/14 0639 08/02/14 1353 08/02/14 2036 08/03/14 0529  BP: 121/71 133/75 119/66 136/79  Pulse:  71 67 73  Temp: 97.9 F (36.6 C) 97.9 F (36.6 C) 98.1 F (36.7 C) 98 F  (36.7 C)  TempSrc: Oral Oral Oral Oral  Resp: 16 16 16 16   Height:      Weight:      SpO2: 99% 96% 99% 99%    Intake/Output Summary (Last 24 hours) at 08/03/14 1026 Last data filed at 08/03/14 0712  Gross per 24 hour  Intake   1195 ml  Output   2000 ml  Net   -805 ml    Exam:   General:  Pt is not in acute distress  Cardiovascular: Regular rate and rhythm, S1/S2, no murmurs  Respiratory: bilateral air entry, no wheezing, no crackles, no rhonchi  Abdomen: non tender, non distended, bowel sounds present  Extremities: pulses palpable bilaterally  Neuro: Nonfocal  Data Reviewed: Basic Metabolic Panel:  Recent Labs Lab 07/28/14 0438 07/29/14 0802 07/30/14 0355 08/01/14 0730 08/03/14 0517  NA 135* 139 140 135* 142  K 4.5 4.1 4.3 4.1 4.3  CL 102 106 105 99 104  CO2 20 22 23 25 28   GLUCOSE 190* 110* 92 185* 101*  BUN 19 17 14 16 19   CREATININE 1.88* 1.44* 1.38* 1.08 1.10  CALCIUM 8.3* 8.1* 8.4 8.6 8.7   Liver Function Tests: No results found for this basename: AST, ALT, ALKPHOS, BILITOT, PROT, ALBUMIN,  in the last 168 hours No results found for this basename: LIPASE, AMYLASE,  in the last 168 hours No results found for this basename: AMMONIA,  in the last 168 hours CBC:  Recent Labs Lab 07/29/14 0802 07/30/14 0355 07/31/14 0540 08/01/14 0730 08/03/14 0517  WBC 5.6 4.6 5.2 8.5 7.0  HGB 6.9* 7.4* 9.1* 9.3* 9.6*  HCT 20.5* 21.9* 27.1* 27.0* 28.9*  MCV 106.8* 103.8* 100.7* 98.5 101.4*  PLT 162 150 161 181 196   Cardiac Enzymes: No results found for this basename: CKTOTAL, CKMB, CKMBINDEX, TROPONINI,  in the last 168 hours BNP: No components found with this basename: POCBNP,  CBG: No results found for this basename: GLUCAP,  in the last 168 hours  No results found for this or any previous visit (from the past 240 hour(s)).   Scheduled Meds: . atorvastatin  40 mg Oral q1800  . cephALEXin  500 mg Oral Q12H  . feeding supplement (ENSURE COMPLETE)   237 mL Oral BID BM  . phenazopyridine  200 mg Oral TID WC  . tamsulosin  0.4 mg Oral Daily  . verapamil  240 mg Oral BID   Continuous Infusions: . sodium chloride 30 mL/hr at 08/02/14 [id number]

## 2014-08-04 ENCOUNTER — Inpatient Hospital Stay (HOSPITAL_COMMUNITY): Payer: Medicare Other

## 2014-08-04 MED ORDER — OXYBUTYNIN CHLORIDE 5 MG PO TABS
5.0000 mg | ORAL_TABLET | Freq: Three times a day (TID) | ORAL | Status: DC | PRN
  Filled 2014-08-04: qty 1

## 2014-08-04 NOTE — Progress Notes (Signed)
Patient ID: Daniel Vega, male   DOB: 1941-05-14, 73 y.o.   MRN: [id number] [facility] PROGRESS NOTE  LEONIDAS BOATENG [id number]:[id number] DOB: [birth year] DOA: 07/26/2014 PCP: Marjorie Smolder, MD  Brief narrative: 73 y.o. male with a PMH of prostate cancer diagnosed 2009 status post radiation therapy, history of intermittent gross hematuria thought to be a consequence of his prior radiation treatments, pulmonary embolism on Xarelto who was admitted 07/26/14 with gross hematuria and flank pain. CT done on admission showed evidence of mid right ureteral obstruction and hydronephrosis. Creatinine also found to be elevated at 1.8 (baseline creatinine 1-1.2). Throughout the hospital stay, patient continued to have hematuria. Xarelto was stopped and patient underwent IVC filter placement 07/28/2014. He also had 2 units PRBC transfusion throughout the hospital stay. Repeat CT scan 07/30/2014 showed ongoing right side hydronephrosis and patient underwent cystoscopy with retrograde pyelogram with right stent placement on 07/31/2014. In addition, patient was noted to have right pleural effusion noted incidentally on CT scan but confirmed with CXR. US thoracentesis done 08/01/2014 with 600 blood tinged fluid removed.   Assessment/Plan:   Principal Problem:  Hematuria / acute blood loss anemia / chronic anticoagulation  Unclear etiology of bleeding. Because of persistent hematuria and hydronephrosis (hydronephrosis seen on CT scan on admission and on repeat CT scan 07/30/2014) GU recommended cystoscopy with retrograde pyelogram and right stent placement which pt underwent on 07/31/2014. Pt required intermittent catheterization on 08/02/14 but since then has been able to void on his own.  Continue Pyridium 200 mg PO TID. Continue Flomax 0.4 mg daily.  Patient was started on cipro 07/31/2014 for 3 days. Started on Keflex 08/01/2014.  Because of the hematuria xarelto was stopped. Patient required IVC filter  placement 07/28/2014. He has also received total of 2 units of PRBC since admission (10/13 and 10/14).  Post transfusion hemoglobin remains stable over the past 24 hours.  Please note that the lower extremity doppler negative for DVT, but ongoing superficial vein thrombosis of the left greater saphenous vein from the thigh to the ankle.  Active Problems:  Cold intolerance  Resolved now but was likely from acute blood loss anemia. TSH is within normal limits. Right pleural effusion  Seen on CT scan for renal protocol. CXR confirmed increased right pleural effusion.  US thoracentesis done 08/01/2014 with 600 cc fluids removed. Repeat CXR this morning showed improvement in pleural effusion. Breathing better today.  Grade I obesity / weight loss  Dietician consulted. Hypercholesterolemia  Continue Lipitor 40 mg at bedtime. Hypertension  Continue verapamil. Lisinopril stopped because of acute renal failure.  Prostate cancer  Status post combination radiotherapy 06/2008 with no evidence of disease recurrence. Embolism, pulmonary with infarction  Patient with acute blood loss anemia while on blood thinners. Xarelto stopped and IVC filter placed 07/28/2014.  Lower extremity Doppler negative for DVT.  Rheumatoid arthritis  Takes methotrexate weekly along with Folvite. Hydronephrosis, right / acute renal failure  Acute renal failure likely secondary to combination of hydronephrosis, lisinopril.  Lisinopril is on hold.  Creatinine is trending down, 1.44 --> 1.38 --> remains WNL   DVT Prophylaxis  SCDs bilaterally. Patient also has IVC filter.  Code Status: Full.  Family Communication: Wife at the bedside.  Disposition Plan: Home when stable. Encouraged to participate with physical therapy.   IV Access:   Peripheral IV Procedures and diagnostic studies:   Ir Ivc Filter Plmt / S&i /img Guid/mod Sed 10/12/201: Status post car monoxide IVC cavagram demonstrates renal vein at the  level of L1.  Status post placement of IVC filter below the renal veins  Ct Renal Stone Study 07/30/2014 1. Persistent moderate right hydronephrosis with abrupt transition in the mid right ureter, worrisome for an obstructing urothelial carcinoma. 2. Moderate to large right pleural effusion, slightly increased in the interval, with heterogeneous collapse/consolidation in the right middle and right lower lobes, indicative of evolving pulmonary infarction. Superimposed pneumonia and empyema cannot be excluded. 3. Small left pleural effusion is new. 4. Three-vessel coronary artery calcification. 5. Tiny punctate stones in the right kidney.  Cystoscopy with retrograde pyelogram, ureteroscopy with fulguration and stent placement (Right) 07/31/2014  Dg Chest 1 View 08/01/2014 1. Small right pleural effusion, decreased from prior. No pneumothorax identified. 2. Persistent right basilar consolidation.  US thoracentesis: 08/01/2014 - 600 cc blood tinged fluid drained.  Dg Chest Port 1 View 08/01/2014 Increased right pleural effusion with associated atelectasis.  Dg Chest 1 View 08/01/2014 1. Small right pleural effusion, decreased from prior. No pneumothorax identified. 2. Persistent right basilar consolidation.  Dg Chest Port 1 View 08/03/2014 Improve small bilateral pleural effusions and basilar atelectasis. No new abnormality.  Medical Consultants:   Dr. Franchot Gallo, Urology.  Dr. Corrie Mckusick, IR Other Consultants:   None. Anti-Infectives:   Cipro 07/31/2014 --> for 3 days  Keflex 08/01/2014 -->  Leisa Lenz, MD  [facility] Pager [phone]  If 7PM-7AM, please contact night-coverage [email or web address] Password [email or web address] 08/04/2014, 9:28 AM   LOS: 9 days    HPI/Subjective: No acute overnight events.  Objective: Filed Vitals:   08/03/14 1456 08/03/14 2053 08/03/14 2203 08/04/14 0615  BP: 105/56 139/63 167/72 127/66  Pulse: 71 66 75 71  Temp: 97.9 F (36.6 C) 98.4 F (36.9 C) 97.9 F (36.6 C)  98.6 F (37 C)  TempSrc: Oral Oral  Oral  Resp: 16 15 19 18   Height:      Weight:      SpO2: 99% 99% 100% 100%    Intake/Output Summary (Last 24 hours) at 08/04/14 0928 Last data filed at 08/03/14 2200  Gross per 24 hour  Intake    240 ml  Output   1100 ml  Net   -860 ml    Exam:   General:  Pt is alert, follows commands appropriately, not in acute distress  Cardiovascular: Regular rate and rhythm, S1/S2, no murmurs  Respiratory: Clear to auscultation bilaterally, no wheezing, no crackles, no rhonchi  Abdomen: Soft, non tender, non distended, bowel sounds present  Extremities: No edema, pulses DP and PT palpable bilaterally   Data Reviewed: Basic Metabolic Panel:  Recent Labs Lab 07/29/14 0802 07/30/14 0355 08/01/14 0730 08/03/14 0517  NA 139 140 135* 142  K 4.1 4.3 4.1 4.3  CL 106 105 99 104  CO2 22 23 25 28   GLUCOSE 110* 92 185* 101*  BUN 17 14 16 19   CREATININE 1.44* 1.38* 1.08 1.10  CALCIUM 8.1* 8.4 8.6 8.7   CBC:  Recent Labs Lab 07/29/14 0802 07/30/14 0355 07/31/14 0540 08/01/14 0730 08/03/14 0517  WBC 5.6 4.6 5.2 8.5 7.0  HGB 6.9* 7.4* 9.1* 9.3* 9.6*  HCT 20.5* 21.9* 27.1* 27.0* 28.9*  MCV 106.8* 103.8* 100.7* 98.5 101.4*  PLT 162 150 161 181 196   Scheduled Meds: . atorvastatin  40 mg Oral q1800  . cephALEXin  500 mg Oral Q12H  . phenazopyridine  200 mg Oral TID WC  . tamsulosin  0.4 mg Oral Daily  . verapamil  240 mg Oral BID  Continuous Infusions: . sodium chloride 30 mL/hr at 08/02/14 [id number]

## 2014-08-04 NOTE — Progress Notes (Signed)
Physical Therapy Treatment Patient Details Name: Daniel Vega MRN: [id number] DOB: 04/09/1941 Today's Date: 08/04/2014    History of Present Illness 73 yo male admitted with hematuria, PE-s/p IVC filter placement 07/28/14. Hx of PE, HTN, prostate cancer, laminectomy 07/2012    PT Comments    Progressing with mobility. Discussed HHPT with pt/family-explained that I feel it will be beneficial so that he can work on his strength and balance.   Follow Up Recommendations  Home health PT (discussed this with pt/family-feel it will be beneficial.)     Equipment Recommendations  None recommended by PT    Recommendations for Other Services       Precautions / Restrictions Precautions Precautions: Fall Precaution Comments: SOB with activity Restrictions Weight Bearing Restrictions: No    Mobility  Bed Mobility         Supine to sit: Modified independent (Device/Increase time);HOB elevated        Transfers Overall transfer level: Needs assistance   Transfers: Sit to/from Stand Sit to Stand: Supervision            Ambulation/Gait Ambulation/Gait assistance: Min guard Ambulation Distance (Feet): 250 Feet (250'x1; 150'x1) Assistive device:  (IV pole) Gait Pattern/deviations: Decreased stride length;Decreased step length - right;Decreased step length - left;Step-through pattern     General Gait Details: slow gait speed. dyspnea 2/4 with ambulation. tolerated well. O2 sats 96% on RA. 1 seated rest break needed between walks. VS for pt to increase step length bilaterally.    Stairs            Wheelchair Mobility    Modified Rankin (Stroke Patients Only)       Balance                                    Cognition Arousal/Alertness: Awake/alert Behavior During Therapy: WFL for tasks assessed/performed Overall Cognitive Status: Within Functional Limits for tasks assessed                      Exercises      General Comments         Pertinent Vitals/Pain Pain Assessment: No/denies pain    Home Living                      Prior Function            PT Goals (current goals can now be found in the care plan section) Progress towards PT goals: Progressing toward goals    Frequency  Min 3X/week    PT Plan Current plan remains appropriate    Co-evaluation             End of Session   Activity Tolerance: Patient tolerated treatment well Patient left: in bed;with call bell/phone within reach;with family/visitor present     Time: 7001-7494 PT Time Calculation (min): 30 min  Charges:  $Gait Training: 23-37 mins                    G Codes:      Weston Anna, MPT Pager: [phone]

## 2014-08-04 NOTE — Progress Notes (Signed)
4 Days Post-Op Subjective: Patient reports that he is voiding better now. No real gross hematuria but he has been taking phenazopyridine. He has urinary frequency.  Objective: Vital signs in last 24 hours: Temp:  [97.9 F (36.6 C)-98.6 F (37 C)] 98.6 F (37 C) (10/19 0615) Pulse Rate:  [66-75] 71 (10/19 0615) Resp:  [15-19] 18 (10/19 0615) BP: (105-167)/(56-72) 127/66 mmHg (10/19 0615) SpO2:  [99 %-100 %] 100 % (10/19 0615)  Intake/Output from previous day: 10/18 0701 - 10/19 0700 In: 480 [P.O.:480] Out: 1900 [Urine:1900] Intake/Output this shift:    Physical Exam:  Constitutional: Vital signs reviewed. WD WN in NAD   Eyes: PERRL, No scleral icterus.     Lab Results:  Recent Labs  08/03/14 0517  HGB 9.6*  HCT 28.9*   BMET  Recent Labs  08/03/14 0517  NA 142  K 4.3  CL 104  CO2 28  GLUCOSE 101*  BUN 19  CREATININE 1.10  CALCIUM 8.7   No results found for this basename: LABPT, INR,  in the last 72 hours No results found for this basename: LABURIN,  in the last 72 hours Results for orders placed during the hospital encounter of 07/14/14  CULTURE, BLOOD (ROUTINE X 2)     Status: None   Collection Time    07/14/14  7:01 PM      Result Value Ref Range Status   Specimen Description BLOOD RIGHT ARM   Final   Special Requests BOTTLES DRAWN AEROBIC AND ANAEROBIC 10CC   Final   Culture  Setup Time     Final   Value: 07/15/2014 00:36     Performed at [facility]   Culture     Final   Value: NO GROWTH 5 DAYS     Note: Culture results may be compromised due to an excessive volume of blood received in culture bottles.     Performed at [facility]   Report Status 07/21/2014 FINAL   Final  CULTURE, BLOOD (ROUTINE X 2)     Status: None   Collection Time    07/14/14  7:14 PM      Result Value Ref Range Status   Specimen Description BLOOD HAND RIGHT   Final   Special Requests BOTTLES DRAWN AEROBIC AND ANAEROBIC 5CC   Final   Culture  Setup Time      Final   Value: 07/15/2014 00:30     Performed at [facility]   Culture     Final   Value: NO GROWTH 5 DAYS     Performed at [facility]   Report Status 07/21/2014 FINAL   Final    Studies/Results: Dg Chest Port 1 View  08/03/2014   CLINICAL DATA:  Shortness of breath.  Pleural effusion.  EXAM: PORTABLE CHEST - 1 VIEW  COMPARISON:  Single view of the chest 08/01/2014. CT chest and PA and lateral chest 07/14/2014.  FINDINGS: Small right pleural effusion and basilar atelectasis appear improved since the most recent examination. Trace left pleural effusion and mild basilar atelectasis also show improvement. There is no pneumothorax. Heart size is upper normal. Chondroid lesion proximal right humerus is compatible with an enchondroma.  IMPRESSION: Improve small bilateral pleural effusions and basilar atelectasis. No new abnormality.   Electronically Signed   By: Inge Rise M.D.   On: 08/03/2014 10:14    Assessment/Plan:   1. Right hydronephrosis without evidence of pathology in the right mid ureter-status post ureteroscopy  2. Gross hematuria, resolving. Likely from both of her urinary tract and lower nares tract source-upper tract cytologies are normal. His bladder was cauterized, he is having no gross hematuria  I think it is okay to let him go at any point. I will put him on when necessary tolterodine for frequent urination, I don't think the phenazopyridine is much of a help. We will call to set up an appointment for him to see me in about a month. The stent will be removed, and if his renal function is fine, I will perform CT hematuria protocol to assess right renal pelvis.     LOS: 9 days   Franchot Gallo M 08/04/2014, 10:17 AM

## 2014-08-05 DIAGNOSIS — D126 Benign neoplasm of colon, unspecified: Secondary | ICD-10-CM

## 2014-08-05 LAB — BASIC METABOLIC PANEL
ANION GAP: 12 (ref 5–15)
BUN: 16 mg/dL (ref 6–23)
CHLORIDE: 103 meq/L (ref 96–112)
CO2: 27 meq/L (ref 19–32)
Calcium: 8.6 mg/dL (ref 8.4–10.5)
Creatinine, Ser: 1.13 mg/dL (ref 0.50–1.35)
GFR calc Af Amer: 73 mL/min — ABNORMAL LOW (ref >90)
GFR calc non Af Amer: 63 mL/min — ABNORMAL LOW (ref >90)
GLUCOSE: 114 mg/dL — AB (ref 70–99)
Potassium: 4.4 mEq/L (ref 3.7–5.3)
Sodium: 142 mEq/L (ref 137–147)

## 2014-08-05 LAB — CBC
HCT: 29.8 % — ABNORMAL LOW (ref 39.0–52.0)
HEMOGLOBIN: 9.7 g/dL — AB (ref 13.0–17.0)
MCH: 33.3 pg (ref 26.0–34.0)
MCHC: 32.6 g/dL (ref 30.0–36.0)
MCV: 102.4 fL — AB (ref 78.0–100.0)
PLATELETS: 187 10*3/uL (ref 150–400)
RBC: 2.91 MIL/uL — ABNORMAL LOW (ref 4.22–5.81)
RDW: 16.8 % — ABNORMAL HIGH (ref 11.5–15.5)
WBC: 5.3 10*3/uL (ref 4.0–10.5)

## 2014-08-05 MED ORDER — TRAMADOL HCL 50 MG PO TABS: 50.0000 mg | ORAL_TABLET | Freq: Four times a day (QID) | ORAL | Status: DC | PRN

## 2014-08-05 MED ORDER — TAMSULOSIN HCL 0.4 MG PO CAPS: 0.4000 mg | ORAL_CAPSULE | Freq: Every day | ORAL | Status: DC

## 2014-08-05 MED ORDER — OXYBUTYNIN CHLORIDE 5 MG PO TABS: 5.0000 mg | ORAL_TABLET | Freq: Three times a day (TID) | ORAL | Status: DC | PRN

## 2014-08-05 MED ORDER — CEPHALEXIN 500 MG PO CAPS: 500.0000 mg | ORAL_CAPSULE | Freq: Two times a day (BID) | ORAL | Status: DC

## 2014-08-05 MED ORDER — ENSURE COMPLETE PO LIQD: 237.0000 mL | Freq: Two times a day (BID) | ORAL | Status: DC

## 2014-08-05 NOTE — Discharge Summary (Signed)
Physician Discharge Summary  Daniel Vega ZOX:[id number] DOB: Oct 30, 1940 DOA: 07/26/2014  PCP: Marjorie Smolder, MD  Admit date: 07/26/2014 Discharge date: 08/05/2014  Recommendations for Outpatient Follow-up:  1. Please followup with urology per scheduled appointment. They will call you to schedule an appointment in your office. Continue ditropan every 8 hours as needed as prescribed for urinary frequency or urgency. 2. Continue Keflex for 3 more days on discharge. 3. Continue to hold aspirin until seen by GU. If no further bleeding then you may be able to restart aspirin later. 4. Blood pressure is on soft side so please hold lisinopril for now. May continue taking verapamil.  Discharge Diagnoses:  Principal Problem:   Hematuria Active Problems:   Hypertension   Hypercholesteremia   Prostate cancer   Embolism, pulmonary with infarction   Rheumatoid arthritis   Hydronephrosis, right   Chronic anticoagulation   Acute blood loss anemia   Acute renal failure   Loss of weight   Obesity (BMI 30-39.9)    Discharge Condition: stable   Diet recommendation: as tolerated   History of present illness:  73 y.o. male with a PMH of prostate cancer diagnosed 2009 status post radiation therapy, history of intermittent gross hematuria thought to be a consequence of his prior radiation treatments, pulmonary embolism on Xarelto who was admitted 07/26/14 with gross hematuria and flank pain. CT done on admission showed evidence of mid right ureteral obstruction and hydronephrosis. Creatinine also found to be elevated at 1.8 (baseline creatinine 1-1.2). Throughout the hospital stay, patient continued to have hematuria. Xarelto was stopped and patient underwent IVC filter placement 07/28/2014. He also had 2 units PRBC transfusion throughout the hospital stay. Repeat CT scan 07/30/2014 showed ongoing right side hydronephrosis and patient underwent cystoscopy with retrograde pyelogram with right  stent placement on 07/31/2014. In addition, patient was noted to have right pleural effusion noted incidentally on CT scan but confirmed with CXR. US thoracentesis done 08/01/2014 with 600 blood tinged fluid removed.   Assessment/Plan:   Principal Problem:  Hematuria / acute blood loss anemia / chronic anticoagulation  Unclear etiology of bleeding. Because of persistent hematuria and hydronephrosis (hydronephrosis seen on CT scan on admission and on repeat CT scan 07/30/2014) GU recommended cystoscopy with retrograde pyelogram and right stent placement which pt underwent on 07/31/2014. Pt required intermittent catheterization on 08/02/14 but since then has been able to void on his own.  Continue Flomax 0.4 mg daily. Continue ditropan every 8 hours as needed for urinary urgency or frequency.  Patient was started on cipro 07/31/2014 for 3 days. Started on Keflex 08/01/2014. Continue Keflex for 3 more days on discharge since pt has had recent GU procedure.  Because of the hematuria xarelto was stopped. Patient required IVC filter placement 07/28/2014. He has also received total of 2 units of PRBC since admission (10/13 and 10/14).  Post transfusion hemoglobin remains stable over the past 24 hours.  Please note that the lower extremity doppler negative for DVT, but ongoing superficial vein thrombosis of the left greater saphenous vein from the thigh to the ankle.  Active Problems:  Cold intolerance  Resolved now but was likely from acute blood loss anemia. TSH is within normal limits. Right pleural effusion  Seen on CT scan for renal protocol. CXR confirmed increased right pleural effusion.  US thoracentesis done 08/01/2014 with 600 cc fluids removed. Repeat CXR this morning showed improvement in pleural effusion. Respiratory status is table.  Grade I obesity / weight loss  Dietician consulted.  Hypercholesterolemia  Continue Lipitor 40 mg at bedtime. Hypertension  Continue verapamil. Lisinopril  stopped because of acute renal failure. Since BP on soft side on discahrge it is reasonable to continue to hold lisinopril.  Prostate cancer  Status post combination radiotherapy 06/2008 with no evidence of disease recurrence. Embolism, pulmonary with infarction  Patient with acute blood loss anemia while on blood thinners. Xarelto stopped and IVC filter placed 07/28/2014.  Lower extremity Doppler negative for DVT.  Rheumatoid arthritis  Takes methotrexate weekly along with Folvite. Hydronephrosis, right / acute renal failure  Acute renal failure likely secondary to combination of hydronephrosis, lisinopril.  Lisinopril is on hold.  Creatinine is trending down, 1.44 --> 1.38 --> remains WNL  DVT Prophylaxis  SCDs bilaterally. Patient also has IVC filter.   Code Status: Full.  Family Communication: Wife at the bedside.    IV Access:   Peripheral IV Procedures and diagnostic studies:   Ir Ivc Filter Plmt / S&i /img Guid/mod Sed 10/12/201: Status post car monoxide IVC cavagram demonstrates renal vein at the level of L1. Status post placement of IVC filter below the renal veins  Ct Renal Stone Study 07/30/2014 1. Persistent moderate right hydronephrosis with abrupt transition in the mid right ureter, worrisome for an obstructing urothelial carcinoma. 2. Moderate to large right pleural effusion, slightly increased in the interval, with heterogeneous collapse/consolidation in the right middle and right lower lobes, indicative of evolving pulmonary infarction. Superimposed pneumonia and empyema cannot be excluded. 3. Small left pleural effusion is new. 4. Three-vessel coronary artery calcification. 5. Tiny punctate stones in the right kidney.  Cystoscopy with retrograde pyelogram, ureteroscopy with fulguration and stent placement (Right) 07/31/2014  Dg Chest 1 View 08/01/2014 1. Small right pleural effusion, decreased from prior. No pneumothorax identified. 2. Persistent right basilar  consolidation.  US thoracentesis: 08/01/2014 - 600 cc blood tinged fluid drained.  Dg Chest Port 1 View 08/01/2014 Increased right pleural effusion with associated atelectasis.  Dg Chest 1 View 08/01/2014 1. Small right pleural effusion, decreased from prior. No pneumothorax identified. 2. Persistent right basilar consolidation.  Dg Chest Port 1 View 08/03/2014 Improve small bilateral pleural effusions and basilar atelectasis. No new abnormality.  Medical Consultants:   Dr. Franchot Gallo, Urology.  Dr. Corrie Mckusick, IR Other Consultants:   None. Anti-Infectives:   Cipro 07/31/2014 --> for 3 days  Keflex 08/01/2014 --> for 3 more days on discharge    Signed:  Leisa Lenz, MD  [facility] 08/05/2014, 11:17 AM  Pager #: [phone]   Discharge Exam: Filed Vitals:   08/04/14 2137  BP: 107/59  Pulse: 74  Temp: 98.6 F (37 C)  Resp: 16   Filed Vitals:   08/04/14 0615 08/04/14 1057 08/04/14 1340 08/04/14 2137  BP: 127/66 118/75 134/71 107/59  Pulse: 71  91 74  Temp: 98.6 F (37 C)  98.9 F (37.2 C) 98.6 F (37 C)  TempSrc: Oral  Oral Oral  Resp: 18  18 16   Height:      Weight:      SpO2: 100%  97% 98%    General: Pt is alert, follows commands appropriately, not in acute distress Cardiovascular: Regular rate and rhythm, S1/S2 +, no murmurs Respiratory: Clear to auscultation bilaterally, no wheezing, no crackles, no rhonchi Abdominal: Soft, non tender, non distended, bowel sounds +, no guarding Extremities: no edema, no cyanosis, pulses palpable bilaterally DP and PT Neuro: Grossly nonfocal  Discharge Instructions  Discharge Instructions   Call MD for:  difficulty breathing,  headache or visual disturbances    Complete by:  As directed      Call MD for:  persistant dizziness or light-headedness    Complete by:  As directed      Call MD for:  persistant nausea and vomiting    Complete by:  As directed      Call MD for:  severe uncontrolled pain     Complete by:  As directed      Diet - low sodium heart healthy    Complete by:  As directed      Discharge instructions    Complete by:  As directed   1. Please followup with urology per scheduled appointment. They will call you to schedule an appointment in your office. Continue ditropan every 8 hours as needed as prescribed for urinary frequency or urgency. 2. Continue Keflex for 3 more days on discharge. 3. Continue to hold aspirin until seen by GU. If no further bleeding then you may be able to restart aspirin later. 4. Blood pressure is on soft side so please hold lisinopril for now. May continue taking verapamil.     Increase activity slowly    Complete by:  As directed             Medication List    STOP taking these medications       aspirin 81 MG tablet     enoxaparin 80 MG/0.8ML injection  Commonly known as:  LOVENOX     lisinopril 10 MG tablet  Commonly known as:  PRINIVIL,ZESTRIL     predniSONE 5 MG tablet  Commonly known as:  DELTASONE     XARELTO STARTER PACK PO      TAKE these medications       acetaminophen 500 MG tablet  Commonly known as:  TYLENOL  Take 500 mg by mouth every 6 (six) hours as needed for moderate pain (pain).     allopurinol 100 MG tablet  Commonly known as:  ZYLOPRIM  Take 100 mg by mouth Daily.     ALPRAZolam 0.5 MG tablet  Commonly known as:  XANAX  Take 0.5 mg by mouth 3 (three) times daily as needed for anxiety (anxiety).     atorvastatin 40 MG tablet  Commonly known as:  LIPITOR  Take 40 mg by mouth every morning.     cephALEXin 500 MG capsule  Commonly known as:  KEFLEX  Take 1 capsule (500 mg total) by mouth every 12 (twelve) hours.     feeding supplement (ENSURE COMPLETE) Liqd  Take 237 mLs by mouth 2 (two) times daily between meals.     folic acid 1 MG tablet  Commonly known as:  FOLVITE  Take 1 mg by mouth daily.     glucosamine-chondroitin 500-400 MG tablet  Take 1 tablet by mouth 2 (two) times daily.      methotrexate 2.5 MG tablet  Commonly known as:  RHEUMATREX  Take 15 mg by mouth every Wednesday. Caution:Chemotherapy. Protect from light.     nitroGLYCERIN 0.4 MG SL tablet  Commonly known as:  NITROSTAT  Place 0.4 mg under the tongue every 5 (five) minutes as needed. For chest pain     oxybutynin 5 MG tablet  Commonly known as:  DITROPAN  Take 1 tablet (5 mg total) by mouth every 8 (eight) hours as needed (urinary frequency/urgency).     tamsulosin 0.4 MG Caps capsule  Commonly known as:  FLOMAX  Take 1 capsule (0.4 mg total) by  mouth daily.     traMADol 50 MG tablet  Commonly known as:  ULTRAM  Take 1 tablet (50 mg total) by mouth every 6 (six) hours as needed for moderate pain (if response to Tylenol insufficient).     verapamil 240 MG CR tablet  Commonly known as:  CALAN-SR  Take 240 mg by mouth 2 (two) times daily.     VITAMIN B 12 PO  Take 1,000 mcg by mouth daily.           Follow-up Information   Follow up with DAHLSTEDT, Lillette Boxer, MD. (we will call you)    Specialty:  Urology   Contact information:   [place] [address] [phone]       Follow up with Marjorie Smolder, MD. Schedule an appointment as soon as possible for a visit in 2 weeks. (Follow up appt after recent hospitalization)    Specialty:  Family Medicine   Contact information:   [address] [place] [address] [phone]        The results of significant diagnostics from this hospitalization (including imaging, microbiology, ancillary and laboratory) are listed below for reference.    Significant Diagnostic Studies: Dg Chest 1 View  08/01/2014   CLINICAL DATA:  Status post right thoracentesis.  EXAM: CHEST - 1 VIEW  COMPARISON:  08/01/2014 at 0947 hr  FINDINGS: The cardiac silhouette remains mildly enlarged, unchanged. Lung volumes are unchanged. There is a small right pleural effusion, decreased from prior. Fluid is again seen extending in the  right minor fissure. There is persistent right basilar airspace consolidation. Minimal patchy opacity in the left lung base is noted, likely atelectasis. No pneumothorax is identified. Well-marginated sclerotic lesion in the proximal right humerus is unchanged, likely benign.  IMPRESSION: 1. Small right pleural effusion, decreased from prior. No pneumothorax identified. 2. Persistent right basilar consolidation.   Electronically Signed   By: Logan Bores   On: 08/01/2014 16:28   Dg Chest 2 View  07/14/2014   CLINICAL DATA:  Low oxygen saturation.  EXAM: CHEST  2 VIEW  COMPARISON:  07/05/2014.  05/22/2014.  FINDINGS: Opacity at the right base is stable since the previous study but new since 05/22/2014. This may be related atelectasis although infection could have this appearance. Probable small associated right pleural effusion. Left lung is clear. The cardiopericardial silhouette is within normal limits for size. Imaged bony structures of the thorax are intact.  IMPRESSION: Stable. Right base atelectasis or pneumonia with small right pleural effusion. Overall imaging features are stable in the 9 days since the prior study.   Electronically Signed   By: Misty Stanley M.D.   On: 07/14/2014 15:17   Ct Angio Chest Pe W/cm &/or Wo Cm  07/14/2014   CLINICAL DATA:  Recurrent dyspnea.  Short of breath.  Lung cancer.  EXAM: CT ANGIOGRAPHY CHEST WITH CONTRAST  TECHNIQUE: Multidetector CT imaging of the chest was performed using the standard protocol during bolus administration of intravenous contrast. Multiplanar CT image reconstructions and MIPs were obtained to evaluate the vascular anatomy.  CONTRAST:  163mL OMNIPAQUE IOHEXOL 350 MG/ML SOLN  COMPARISON:  Chest radiograph 07/10/2014.  FINDINGS: Bones: No aggressive osseous lesions. Thoracic spine DISH. Exaggerated thoracic kyphosis.  Cardiovascular: Acute pulmonary embolus is present in the RIGHT lower lobe. No evidence of RIGHT heart strain. Aortic atherosclerosis  without an acute aortic abnormality. Coronary artery atherosclerosis is present. If office based assessment of coronary risk factors has not  been performed, it is now recommended.  Lungs: Airspace disease in the RIGHT lower lobe following the distribution of the pulmonary embolus compatible with pulmonary infarction. LEFT upper lobectomy. 9 mm solid and ground-glass attenuation nodule in the hyper expanded LEFT lower lobe (image 25 series 6).  Central airways: Patent.  Effusions: Small RIGHT pleural effusion layering dependently. Small pericardial effusion. No LEFT pleural effusion.  Lymphadenopathy: No axillary adenopathy. No mediastinal or hilar adenopathy.  Esophagus: Small hiatal hernia and patulous gastroesophageal junction.  Upper abdomen: Within normal limits.  Other: None.  Review of the MIP images confirms the above findings.  IMPRESSION: 1. Acute RIGHT lower lobe pulmonary embolus with RIGHT lower lobe pulmonary infarct. 2. 9 mm ground-glass attenuation nodule with smaller 5 mm solid component in the expanded LEFT lower lobe following LEFT upper lobectomy. This nodule will require followup. Three-month noncontrast chest CT recommended to assess for persistence. 3. Critical Value/emergent results were called by telephone at the time of interpretation on 07/14/2014 at 8:23 pm to Dr. Shanda Howells , who verbally acknowledged these results.   Electronically Signed   By: Dereck Ligas M.D.   On: 07/14/2014 20:24   Ir Ivc Filter Plmt / S&i /img Guid/mod Sed  07/28/2014   CLINICAL DATA:  73 year old gentleman with a history of pulmonary embolism. He has been initiated on Xarelto, with subsequent hematuria.  He has been referred for IVC filter placement given that he is not a candidate at this time for anti coagulation.  EXAM: ULTRASOUND GUIDANCE FOR VASCULAR ACCESS  IVC CATHETERIZATION AND carbon dioxide VENOGRAM  IVC FILTER INSERTION  Date:  10/12/201510/09/2014 4:16 pm  FLUOROSCOPY TIME:  1 min, 6 seconds   : ANESTHESIA/SEDATION:  1.5 mg Versed, 50 mcg fentanyl  MEDICATIONS AND MEDICAL HISTORY: No additional medications.  CONTRAST:  CO2 contrast  COMPLICATIONS: None  PROCEDURE: Informed consent was obtained from the patient following explanation of the procedure, risks, benefits and alternatives. The patient understands, agrees and consents for the procedure. All questions were addressed. A time out was performed.  Maximal barrier sterile technique utilized including caps, mask, sterile gowns, sterile gloves, large sterile drape, hand hygiene, and betadine prep.  Under sterile condition and local anesthesia, right internal jugular venous access was performed with ultrasound. Over a guide wire, the IVC filter delivery sheath and inner dilator were advanced into the IVC just above the IVC bifurcation. Carbon dioxide injection was performed for CO2 IVC venogram.  IVC VENOGRAM: The IVC is patent. No evidence of thrombus, stenosis, or occlusion. No variant venous anatomy. The renal veins are identified at the level of L1.  IVC FILTER INSERTION: Through the delivery sheath, the Bard Denali IVC filter was deployed in the infrarenal IVC at the L2 level just below the renal veins and above the IVC bifurcation. Further contrast injection was not performed.  The delivery sheath was removed and hemostasis was obtained with compression for 5 minutes. The patient tolerated the procedure well. No immediate complications.  IMPRESSION: Status post car monoxide IVC cavagram demonstrates renal vein at the level of L1.  Status post placement of IVC filter below the renal veins.  Signed,  Dulcy Fanny. Earleen Newport, DO  [facility]  [facility]   Electronically Signed   By: Corrie Mckusick D.O.   On: 07/28/2014 16:38   Dg Chest Port 1 View  08/04/2014   CLINICAL DATA:  Shortness of breath, cough, history of pleural effusion  EXAM: PORTABLE CHEST - 1 VIEW  COMPARISON:  08/03/2014  FINDINGS: Cardiac  shadow is stable. Mild persistent right basilar atelectasis is noted with associated effusion. The overall appearance is stable from the prior day. No pneumothorax is identified. No new focal infiltrate is seen. No acute bony abnormality is noted.  IMPRESSION: Stable changes in the right base.   Electronically Signed   By: Inez Catalina M.D.   On: 08/04/2014 10:27   Dg Chest Port 1 View  08/03/2014   CLINICAL DATA:  Shortness of breath.  Pleural effusion.  EXAM: PORTABLE CHEST - 1 VIEW  COMPARISON:  Single view of the chest 08/01/2014. CT chest and PA and lateral chest 07/14/2014.  FINDINGS: Small right pleural effusion and basilar atelectasis appear improved since the most recent examination. Trace left pleural effusion and mild basilar atelectasis also show improvement. There is no pneumothorax. Heart size is upper normal. Chondroid lesion proximal right humerus is compatible with an enchondroma.  IMPRESSION: Improve small bilateral pleural effusions and basilar atelectasis. No new abnormality.   Electronically Signed   By: Inge Rise M.D.   On: 08/03/2014 10:14   Dg Chest Port 1 View  08/01/2014   CLINICAL DATA:  Right pleural effusion.  EXAM: PORTABLE CHEST - 1 VIEW  COMPARISON:  July 14, 2014.  FINDINGS: Stable cardiomediastinal silhouette. Left lung is clear. Increased right basilar opacity is noted consistent with worsening pleural effusion and subsegmental atelectasis. No pneumothorax is noted. Bony thorax appears intact.  IMPRESSION: Increased right pleural effusion with associated atelectasis.   Electronically Signed   By: Sabino Dick M.D.   On: 08/01/2014 10:26   Ct Renal Stone Study  07/30/2014   CLINICAL DATA:  Hydronephrosis of right kidney N13.30 (ICD-10-CM) and gross hematuria since 07/29/2014. Prostate cancer and left lung cancer.  EXAM: CT ABDOMEN AND PELVIS WITHOUT CONTRAST  TECHNIQUE: Multidetector CT imaging of the abdomen and pelvis was performed following the standard  protocol without IV contrast.  COMPARISON:  07/26/2014 and CT chest 07/14/2014.  FINDINGS: Lower chest: Moderate-to-large right pleural effusion has increased slightly in the interval. Associated pleural thickening. Collapse/ consolidation in the right middle and right lower lobes is seen with areas of decreased attenuation (series 2, image 11), similar to the prior exam. Small left pleural effusion is new with associated compressive atelectasis in the left lower lobe. Heart is mildly enlarged. There is decreased attenuation of the intravascular compartment, indicative of anemia. Small amount of pericardial fluid, stable. Three-vessel coronary artery calcification.  Hepatobiliary: Liver and gallbladder are unremarkable. No biliary ductal dilatation.  Pancreas: Negative.  Spleen: Negative.  Adrenals/Urinary Tract: Adrenal glands are unremarkable. Tiny punctate stones are seen in the right kidney. Persistent moderate right hydronephrosis with abrupt transition in the mid right ureter. Mild periureteric stranding is seen in association. Remainder of the right ureter is decompressed. Left kidney is unremarkable. Left ureter is decompressed. Foley catheter is seen in a decompressed bladder.  Stomach/Bowel: Stomach and small bowel are unremarkable. Appendix is not readily visualized. Colon is unremarkable.  Vascular/Lymphatic: Atherosclerotic calcification of the arterial vasculature without abdominal aortic aneurysm. No pathologically enlarged lymph nodes.  Reproductive: Brachytherapy seeds are seen in the prostate.  Other: No free fluid.  Mesenteries and peritoneum are unremarkable.  Musculoskeletal: No worrisome lytic or sclerotic lesions. Degenerative changes are seen in the hips, right greater than left, as well as spine.  IMPRESSION: 1. Persistent moderate right hydronephrosis with abrupt transition in the mid right ureter, worrisome for an obstructing urothelial carcinoma. 2. Moderate to large right pleural  effusion, slightly increased in the interval, with heterogeneous collapse/consolidation in the right middle and right lower lobes, indicative of evolving pulmonary infarction. Superimposed pneumonia and empyema cannot be excluded. 3. Small left pleural effusion is new. 4. Three-vessel coronary artery calcification. 5. Tiny punctate stones in the right kidney.   Electronically Signed   By: Lorin Picket M.D.   On: 07/30/2014 11:46   Ct Renal Stone Study  07/26/2014   CLINICAL DATA:  Initial encounter for the right flank pain and hematuria. Personal history of lung cancer. Recent history of pulmonary embolus. Personal history of prostate cancer.  EXAM: CT ABDOMEN AND PELVIS WITHOUT CONTRAST  TECHNIQUE: Multidetector CT imaging of the abdomen and pelvis was performed following the standard protocol without IV contrast.  COMPARISON:  CT abdomen and pelvis without and with contrast 09/09/2013. CTA chest 07/14/2014.  FINDINGS: A right pleural effusion is stable to slightly increased in size some prior study. Residual right lower lobe airspace disease remains with possible infarct lung tissue. There is pleural thickening about the effusion. Minimal atelectasis is present at the left base. The heart size is normal. Coronary artery calcifications are present. Small pericardial effusion is evident.  The liver and spleen are within normal limits. The stomach, duodenum, and pancreas are unremarkable. The common bile duct and gallbladder are within normal limits. The adrenal glands are normal bilaterally. There is marked inflammatory change surrounding the right kidney. Residual layering contrast is present within the collecting system. There is a transition point where the ureter crosses into the pelvis. No mass or stone is evident. The left kidney is unremarkable. There is some stranding without significant inflammation. The left ureter is within normal limits. Mild inferior wall thickening is again seen along the  urinary bladder. Prostate seeds are evident.  The rectosigmoid colon is mostly collapsed. The more proximal colon is within normal limits. The appendix is not discretely visualized and may be surgically absent. The small bowel is unremarkable. Prostate radiation seeds are noted.  The bone windows are unremarkable.  IMPRESSION: 1. Stable to slight increase in right pleural effusion with associated pleural thickening compatible with the chronicity of the fusion. 2. Persistent right lower lobe airspace disease, likely representing infarcted tissue. 3. Slight increase in small pericardial effusion. 4. Coronary artery calcifications. 5. Moderate right-sided hydronephrosis with layering contrast still visualized following CT angiogram study 2 weeks ago. 6. Focal transition of obstructed ureter at the pelvic inlet. No discrete mass or stone is evident. This may be secondary to surrounding inflammation. Urology consultation is recommended. 7. Prostate seeds.   Electronically Signed   By: Lawrence Santiago M.D.   On: 07/26/2014 11:45   US Thoracentesis Asp Pleural Space W/img Guide  08/04/2014   INDICATION: Symptomatic right sided pleural effusion  EXAM: US THORACENTESIS ASP PLEURAL SPACE W/IMG GUIDE  COMPARISON:  None.  MEDICATIONS: None  COMPLICATIONS: None immediate  TECHNIQUE: Informed written consent was obtained from the patient after a discussion of the risks, benefits and alternatives to treatment. A timeout was performed prior to the initiation of the procedure.  Initial ultrasound scanning demonstrates a right pleural effusion. The lower chest was prepped and draped in the usual sterile fashion. 1% lidocaine was used for local anesthesia.  Under direct ultrasound guidance, a 19 gauge, 7-cm, Yueh catheter was introduced. An ultrasound image was saved for documentation purposes. The thoracentesis was performed. The catheter was removed and a dressing was applied. The patient tolerated the procedure well without  immediate post procedural complication. The  patient was escorted to have an upright chest radiograph.  FINDINGS: A total of approximately 600 ml of blood tinged fluid was removed.  IMPRESSION: Successful ultrasound-guided right sided thoracentesis yielding 600 ml of pleural fluid.  Read By:  Tsosie Billing PA-C   Electronically Signed   By: Arne Cleveland M.D.   On: 08/01/2014 16:31    Microbiology: No results found for this or any previous visit (from the past 240 hour(s)).   Labs: Basic Metabolic Panel:  Recent Labs Lab 07/30/14 0355 08/01/14 0730 08/03/14 0517 08/05/14 0415  NA 140 135* 142 142  K 4.3 4.1 4.3 4.4  CL 105 99 104 103  CO2 23 25 28 27   GLUCOSE 92 185* 101* 114*  BUN 14 16 19 16   CREATININE 1.38* 1.08 1.10 1.13  CALCIUM 8.4 8.6 8.7 8.6   Liver Function Tests: No results found for this basename: AST, ALT, ALKPHOS, BILITOT, PROT, ALBUMIN,  in the last 168 hours No results found for this basename: LIPASE, AMYLASE,  in the last 168 hours No results found for this basename: AMMONIA,  in the last 168 hours CBC:  Recent Labs Lab 07/30/14 0355 07/31/14 0540 08/01/14 0730 08/03/14 0517 08/05/14 0415  WBC 4.6 5.2 8.5 7.0 5.3  HGB 7.4* 9.1* 9.3* 9.6* 9.7*  HCT 21.9* 27.1* 27.0* 28.9* 29.8*  MCV 103.8* 100.7* 98.5 101.4* 102.4*  PLT 150 161 181 196 187   Cardiac Enzymes: No results found for this basename: CKTOTAL, CKMB, CKMBINDEX, TROPONINI,  in the last 168 hours BNP: BNP (last 3 results)  Recent Labs  07/05/14 0506  PROBNP 116.1   CBG: No results found for this basename: GLUCAP,  in the last 168 hours  Time coordinating discharge: Over 30 minutes

## 2014-08-05 NOTE — Care Management Note (Signed)
CARE MANAGEMENT NOTE 08/05/2014  Patient:  Daniel Vega, Daniel Vega   Account Number:  [id number]  Date Initiated:  07/29/2014  Documentation initiated by:  Marney Doctor  Subjective/Objective Assessment:   73 yo male admitted with Hematuria     Action/Plan:   From home with spouse.   Anticipated DC Date:  08/05/2014   Anticipated DC Plan:  [place]  CM consult      [facility] Choice  HOME HEALTH   Choice offered to / List presented to:  C-3 Spouse           [place].   Status of service:  In process, will continue to follow Medicare Important Message given?  YES (If response is "NO", the following Medicare IM given date fields will be blank) Date Medicare IM given:  08/01/2014 Medicare IM given by:  Marney Doctor Date Additional Medicare IM given:  08/05/2014 Additional Medicare IM given by:  [facility] Tanav Orsak  Discharge Disposition:    Per UR Regulation:  Reviewed for med. necessity/level of care/duration of stay  If discussed at [place] of Stay Meetings, dates discussed:   07/31/2014    Comments:  08/05/14 Marney Doctor RN,BSN,NCM Pt and wife agreed to [place].  They would like to use [facility]. [facility] rep informed of referral.  Will need MD order for HHPT.  08/01/14 Marney Doctor RN,BSN,NCM Potential DC over weekend.  [facility] rep called to keep eye out for pt if he decides to go forward with HHPT.  Will need HHPT order if pt agrees.  07/31/14 Marney Doctor RN,BSN,NCM Met with pt and wife to assess for DC needs.  Pt already receives home 02 from [facility] and would use them for [facility] if needed. Pt has a walker at home.   PT recommended HHPT and pt declines at this time.  Wife asked that we check back with them as they would talk about HHPT and decide closer to DC.  CM will continue to follow.  07/29/14 Marney Doctor RN,BSN,NCM [phone] For one unit of blood today for HGB of 6.9.  CM will follow for DC needs.

## 2014-08-05 NOTE — Discharge Instructions (Signed)
Inferior Vena Cava Filter Insertion Insertion of an inferior vena cava (IVC) filter is a procedure in which a filter is placed into the large vein in your abdomen that carries blood from the lower part of your body to your heart (inferior vena cava). Placement of the filter here helps prevent blood clots in the legs or pelvis from traveling to your lungs. A large blood clot in the lungs can cause death.  The filter is a small, metal device about an inch long. It is shaped like the spokes of an umbrella and is inserted through a pathway created in your neck or groin. The risks of this procedure are usually small and easily managed. Inferior vena cava filters are only used when blood thinners (anticoagulants) cannot be used to prevent blood clots from forming. This may occur because of:  You have severe platelet problems or shortage.  You have had recent or current major bleeding that cannot be treated.  You have bleeding associated with anticoagulants.  You have recurrence of blood clots while on anticoagulants.  You have a need for surgery in the near future.  You have bleeding in your head. EXPECTATIONS OF A FILTER  An IVC filter will reduce the risk of a large blood clot making its way to your lungs (pulmonary embolus, or PE). It cannot eliminate the risk completely, or prevent small PEs from occurring.  It does not stop blood clots from growing, recurring, or developing into postphlebitic syndrome. This is a condition that can occur after there is inflammation in a vein (phlebitis). LET [facility] CARE PROVIDER KNOW ABOUT:  Any allergies you have. This includes an allergy to iodine or contrast dye.  All medicines you are taking, including vitamins, herbs, eye drops, creams and over-the-counter medicines.  Previous problems you or members of your family have had with the use of anesthetics.  Any blood disorders you have.  Previous surgeries you had had.  Medical conditions you  have.  Possibility of pregnancy, if this applies. RISKS AND COMPLICATIONS Generally, this is a safe procedure. However, as with any procedure, problems can occur. Possible problems include:  A small bruise around the needle insertion site. A larger pooling of blood called a hematoma may form. This is usually of no concern.  The filter can block the vena cava. This can cause some swelling of the legs.  The filter may eventually fail and not work properly.  Continued bleeding or infections (uncommon).  Damage to the vein by the catheter (rare). BEFORE THE PROCEDURE  Your health care provider may want you to have blood tests. These tests can help tell how well your kidneys and liver are working. They can also show how well your blood clots.  If you take blood thinners, ask your health care provider if and when you should stop taking them.  Do not eat or drink for 4 hours before the procedure or as directed by your health care provider.  Make arrangements for someone to drive you home. Depending on the procedure, you may be able to go home the same day.  PROCEDURE   Insertion of a Vena Cava filter is performed by a vascular surgeon or cardiologist in a cardiac catheterization lab.  The procedure usually takes about 30 minutes to 1 hour. This can vary.  An IV needle will be inserted into one of your veins. Medicine will be able to flow directly into your body through this needle.  Medicines may given to help you relax  and relieve anxiety (sedative).  The procedure is done through a large vein either in your neck or groin. The skin around this area is cleaned and shaved, as necessary.  The skin and deeper tissues over the vein will be made numb with a local anesthetic. You are awake during the procedure and can let your health care providers know if you have discomfort.  A needle is then put into the vein. A guide wire is placed through the needle and into the vein. This is used to  help insert a catheter and the IVC filter into your vein.  Contrast dye may be injected into the inferior vena cava to help guide the catheter and verify precise placement of the IVC filter. X-ray equipment may also be used to verify that the catheter and the wire are in the correct position.  The wire is then withdrawn.  The IVC filter is then passed over the catheter into the vein, and inserted into the correct location in the vena cava.  The catheter is then removed. Pressure will be kept on the needle insertion point for several minutes or until it is unlikely to bleed. AFTER THE PROCEDURE  You will stay in a recovery area until any sedation medicine you were given has worn off. Your blood pressure and pulse will be checked.  If there are no problems, you should be able to go home after the procedure.  You may feel sore at the area of the needle insertion for a few days. Document Released: 11/23/2005 Document Revised: 10/08/2013 Document Reviewed: 06/10/2013 [facility] Patient Information 2015 [place], [place]. This information is not intended to replace advice given to you by your health care provider. Make sure you discuss any questions you have with your health care provider. Hydronephrosis Hydronephrosis is an abnormal enlargement of your kidney. It can affect one or both the kidneys. It results from the backward pressure of urine on the kidneys, when the flow of urine is blocked. Normally, the urine drains from the kidney through the urine tube (ureter), into a sac which holds the urine until urination (bladder). When the urinary flow is blocked, the urine collects above the block. This causes an increase in the pressure inside the kidney, which in turn leads to its enlargement. The block can occur at the point where the kidney joins the ureter. Treatment depends on the cause and location of the block.  CAUSES  The causes of this condition include:  Birth defect of the kidney or  ureter.  Kink at the point where the kidney joins the ureter.  Stones and blood clots in the kidney or ureter.  Cancer, injury, or infection of the ureter.  Scar tissue formation.  Backflow of urine (reflux).  Cancer of bladder or prostate gland.  Abnormality of the nerves or muscles of the kidney or ureter.  Lower part of the ureter protruding into the bladder (ureterocele).  Abnormal contractions of the bladder.  Both the kidneys can be affected during pregnancy. This is because the enlarging uterus presses on the ureters and blocks the flow of urine. SYMPTOMS  The symptoms depend on the location of the block. They also depend on how long the block has been present. You may feel pain on the affected side. Sometimes, you may not have any symptoms. There may be a dull ache or discomfort in the flank. The common symptoms are:  Flank pain.  Swelling of the abdomen.  Pain in the abdomen.  Nausea and vomiting.  Fever.  Pain while passing urine.  Urgency for urination.  Frequent or urgent urination.  Infection of the urinary tract. DIAGNOSIS  Your caregiver will examine you after asking about your symptoms. You may be asked to do blood and urine tests. Your caregiver may order a special X-ray, ultrasound, or CT scan. Sometimes a rigid or flexible telescope (cystoscope) is used to view the site of the blockage.  TREATMENT  Treatment depends on the site, cause, and duration of the block. The goal of treatment is to remove the blockage. Your caregiver will plan the treatment based on your condition. The different types of treatment are:   Putting in a soft plastic tube (ureteral stent) to connect the bladder with the kidney. This will help in draining the urine.  Putting in a soft tube (nephrostomy tube). This is placed through skin into the kidney. The trapped urine is drained out through the back. A plastic bag is attached to your skin to hold the urine that has drained  out.  Antibiotics to treat or prevent infection.  Breaking down of the stone (lithotripsy). HOME CARE INSTRUCTIONS   It may take some time for the hydronephrosis to go away (resolve). Drink fluids as directed by your caregiver , and get a lot of rest.  If you have a drain in, your caregiver will give you directions about how to care for it. Be sure you understand these directions completely before you go home.  Take any antibiotics, pain medications, or other prescriptions exactly as prescribed.  Follow-up with your caregivers as directed. SEEK MEDICAL CARE IF:   You continue to have flank pain, nausea, or difficulty with urination.  You have any problem with any type of drainage device.  Your urine becomes cloudy or bloody. SEEK IMMEDIATE MEDICAL CARE IF:   You have severe flank and/or abdominal pain.  You develop vomiting and are unable to hold down fluids.  You develop a fever above 100.5 F (38.1 C), or as per your caregiver. MAKE SURE YOU:   Understand these instructions.  Will watch your condition.  Will get help right away if you are not doing well or get worse. Document Released: 07/31/2007 Document Revised: 12/26/2011 Document Reviewed: 09/16/2010 [facility] Patient Information 2015 [place], [place]. This information is not intended to replace advice given to you by your health care provider. Make sure you discuss any questions you have with your health care provider.

## 2014-08-06 DIAGNOSIS — I10 Essential (primary) hypertension: Secondary | ICD-10-CM | POA: Diagnosis not present

## 2014-08-06 DIAGNOSIS — R339 Retention of urine, unspecified: Secondary | ICD-10-CM | POA: Diagnosis not present

## 2014-08-06 DIAGNOSIS — E785 Hyperlipidemia, unspecified: Secondary | ICD-10-CM | POA: Diagnosis not present

## 2014-08-06 DIAGNOSIS — M069 Rheumatoid arthritis, unspecified: Secondary | ICD-10-CM | POA: Diagnosis not present

## 2014-08-06 DIAGNOSIS — I2609 Other pulmonary embolism with acute cor pulmonale: Secondary | ICD-10-CM | POA: Diagnosis not present

## 2014-08-06 DIAGNOSIS — I2510 Atherosclerotic heart disease of native coronary artery without angina pectoris: Secondary | ICD-10-CM | POA: Diagnosis not present

## 2014-08-08 DIAGNOSIS — R339 Retention of urine, unspecified: Secondary | ICD-10-CM | POA: Diagnosis not present

## 2014-08-08 DIAGNOSIS — E785 Hyperlipidemia, unspecified: Secondary | ICD-10-CM | POA: Diagnosis not present

## 2014-08-08 DIAGNOSIS — I2609 Other pulmonary embolism with acute cor pulmonale: Secondary | ICD-10-CM | POA: Diagnosis not present

## 2014-08-08 DIAGNOSIS — I10 Essential (primary) hypertension: Secondary | ICD-10-CM | POA: Diagnosis not present

## 2014-08-08 DIAGNOSIS — M069 Rheumatoid arthritis, unspecified: Secondary | ICD-10-CM | POA: Diagnosis not present

## 2014-08-08 DIAGNOSIS — I2510 Atherosclerotic heart disease of native coronary artery without angina pectoris: Secondary | ICD-10-CM | POA: Diagnosis not present

## 2014-08-11 DIAGNOSIS — D509 Iron deficiency anemia, unspecified: Secondary | ICD-10-CM | POA: Diagnosis not present

## 2014-08-11 DIAGNOSIS — F419 Anxiety disorder, unspecified: Secondary | ICD-10-CM | POA: Diagnosis not present

## 2014-08-11 DIAGNOSIS — M069 Rheumatoid arthritis, unspecified: Secondary | ICD-10-CM | POA: Diagnosis not present

## 2014-08-11 DIAGNOSIS — E785 Hyperlipidemia, unspecified: Secondary | ICD-10-CM | POA: Diagnosis not present

## 2014-08-11 DIAGNOSIS — I2609 Other pulmonary embolism with acute cor pulmonale: Secondary | ICD-10-CM | POA: Diagnosis not present

## 2014-08-11 DIAGNOSIS — I10 Essential (primary) hypertension: Secondary | ICD-10-CM | POA: Diagnosis not present

## 2014-08-11 DIAGNOSIS — I2510 Atherosclerotic heart disease of native coronary artery without angina pectoris: Secondary | ICD-10-CM | POA: Diagnosis not present

## 2014-08-11 DIAGNOSIS — R339 Retention of urine, unspecified: Secondary | ICD-10-CM | POA: Diagnosis not present

## 2014-08-12 ENCOUNTER — Encounter (HOSPITAL_COMMUNITY): Payer: Self-pay | Admitting: Emergency Medicine

## 2014-08-12 ENCOUNTER — Emergency Department (HOSPITAL_COMMUNITY)
Admission: EM | Admit: 2014-08-12 | Discharge: 2014-08-12 | Disposition: A | Discharge status: Home / Self Care | Payer: Medicare Other | Source: Home / Self Care | Attending: Emergency Medicine | Admitting: Emergency Medicine

## 2014-08-12 ENCOUNTER — Inpatient Hospital Stay (HOSPITAL_COMMUNITY)
Admission: EM | Admit: 2014-08-12 | Discharge: 2014-08-17 | DRG: 812 | Disposition: A | Discharge status: Home Health | Payer: Medicare Other | Attending: Internal Medicine | Admitting: Internal Medicine

## 2014-08-12 DIAGNOSIS — R310 Gross hematuria: Secondary | ICD-10-CM | POA: Diagnosis present

## 2014-08-12 DIAGNOSIS — D500 Iron deficiency anemia secondary to blood loss (chronic): Secondary | ICD-10-CM | POA: Diagnosis present

## 2014-08-12 DIAGNOSIS — Z9889 Other specified postprocedural states: Secondary | ICD-10-CM | POA: Insufficient documentation

## 2014-08-12 DIAGNOSIS — Z8546 Personal history of malignant neoplasm of prostate: Secondary | ICD-10-CM | POA: Insufficient documentation

## 2014-08-12 DIAGNOSIS — Z6830 Body mass index (BMI) 30.0-30.9, adult: Secondary | ICD-10-CM | POA: Diagnosis not present

## 2014-08-12 DIAGNOSIS — Z86711 Personal history of pulmonary embolism: Secondary | ICD-10-CM | POA: Diagnosis not present

## 2014-08-12 DIAGNOSIS — Z85118 Personal history of other malignant neoplasm of bronchus and lung: Secondary | ICD-10-CM | POA: Insufficient documentation

## 2014-08-12 DIAGNOSIS — D62 Acute posthemorrhagic anemia: Principal | ICD-10-CM | POA: Diagnosis present

## 2014-08-12 DIAGNOSIS — Z79899 Other long term (current) drug therapy: Secondary | ICD-10-CM

## 2014-08-12 DIAGNOSIS — R319 Hematuria, unspecified: Secondary | ICD-10-CM | POA: Diagnosis not present

## 2014-08-12 DIAGNOSIS — I10 Essential (primary) hypertension: Secondary | ICD-10-CM | POA: Diagnosis present

## 2014-08-12 DIAGNOSIS — I2510 Atherosclerotic heart disease of native coronary artery without angina pectoris: Secondary | ICD-10-CM | POA: Insufficient documentation

## 2014-08-12 DIAGNOSIS — E785 Hyperlipidemia, unspecified: Secondary | ICD-10-CM | POA: Diagnosis present

## 2014-08-12 DIAGNOSIS — Z86010 Personal history of colonic polyps: Secondary | ICD-10-CM | POA: Insufficient documentation

## 2014-08-12 DIAGNOSIS — Z95828 Presence of other vascular implants and grafts: Secondary | ICD-10-CM

## 2014-08-12 DIAGNOSIS — I2609 Other pulmonary embolism with acute cor pulmonale: Secondary | ICD-10-CM | POA: Diagnosis not present

## 2014-08-12 DIAGNOSIS — Z792 Long term (current) use of antibiotics: Secondary | ICD-10-CM

## 2014-08-12 DIAGNOSIS — Z87442 Personal history of urinary calculi: Secondary | ICD-10-CM

## 2014-08-12 DIAGNOSIS — E780 Pure hypercholesterolemia: Secondary | ICD-10-CM | POA: Insufficient documentation

## 2014-08-12 DIAGNOSIS — R339 Retention of urine, unspecified: Secondary | ICD-10-CM | POA: Diagnosis present

## 2014-08-12 DIAGNOSIS — F17220 Nicotine dependence, chewing tobacco, uncomplicated: Secondary | ICD-10-CM | POA: Diagnosis present

## 2014-08-12 DIAGNOSIS — N3040 Irradiation cystitis without hematuria: Secondary | ICD-10-CM | POA: Diagnosis not present

## 2014-08-12 DIAGNOSIS — N3289 Other specified disorders of bladder: Secondary | ICD-10-CM | POA: Diagnosis present

## 2014-08-12 DIAGNOSIS — Z87891 Personal history of nicotine dependence: Secondary | ICD-10-CM | POA: Insufficient documentation

## 2014-08-12 DIAGNOSIS — I2699 Other pulmonary embolism without acute cor pulmonale: Secondary | ICD-10-CM

## 2014-08-12 DIAGNOSIS — Z923 Personal history of irradiation: Secondary | ICD-10-CM

## 2014-08-12 DIAGNOSIS — Z8551 Personal history of malignant neoplasm of bladder: Secondary | ICD-10-CM | POA: Diagnosis not present

## 2014-08-12 DIAGNOSIS — T45515A Adverse effect of anticoagulants, initial encounter: Secondary | ICD-10-CM

## 2014-08-12 DIAGNOSIS — C61 Malignant neoplasm of prostate: Secondary | ICD-10-CM | POA: Diagnosis present

## 2014-08-12 DIAGNOSIS — M069 Rheumatoid arthritis, unspecified: Secondary | ICD-10-CM | POA: Diagnosis not present

## 2014-08-12 DIAGNOSIS — N1330 Unspecified hydronephrosis: Secondary | ICD-10-CM

## 2014-08-12 LAB — CBC WITH DIFFERENTIAL/PLATELET
BASOS ABS: 0 10*3/uL (ref 0.0–0.1)
BASOS PCT: 0 % (ref 0–1)
BASOS PCT: 0 % (ref 0–1)
Basophils Absolute: 0 10*3/uL (ref 0.0–0.1)
EOS ABS: 0.2 10*3/uL (ref 0.0–0.7)
EOS ABS: 0.3 10*3/uL (ref 0.0–0.7)
Eosinophils Relative: 3 % (ref 0–5)
Eosinophils Relative: 4 % (ref 0–5)
HCT: 30.9 % — ABNORMAL LOW (ref 39.0–52.0)
HCT: 32.3 % — ABNORMAL LOW (ref 39.0–52.0)
HEMOGLOBIN: 10.5 g/dL — AB (ref 13.0–17.0)
HEMOGLOBIN: 10.2 g/dL — AB (ref 13.0–17.0)
Lymphocytes Relative: 16 % (ref 12–46)
Lymphocytes Relative: 17 % (ref 12–46)
Lymphs Abs: 1.1 10*3/uL (ref 0.7–4.0)
Lymphs Abs: 1.5 10*3/uL (ref 0.7–4.0)
MCH: 33.2 pg (ref 26.0–34.0)
MCH: 34.1 pg — AB (ref 26.0–34.0)
MCHC: 32.5 g/dL (ref 30.0–36.0)
MCHC: 33 g/dL (ref 30.0–36.0)
MCV: 102.2 fL — ABNORMAL HIGH (ref 78.0–100.0)
MCV: 103.3 fL — ABNORMAL HIGH (ref 78.0–100.0)
MONOS PCT: 8 % (ref 3–12)
Monocytes Absolute: 0.6 10*3/uL (ref 0.1–1.0)
Monocytes Absolute: 0.7 10*3/uL (ref 0.1–1.0)
Monocytes Relative: 8 % (ref 3–12)
NEUTROS ABS: 6.6 10*3/uL (ref 1.7–7.7)
Neutro Abs: 5 10*3/uL (ref 1.7–7.7)
Neutrophils Relative %: 72 % (ref 43–77)
Neutrophils Relative %: 72 % (ref 43–77)
PLATELETS: 181 10*3/uL (ref 150–400)
Platelets: 171 10*3/uL (ref 150–400)
RBC: 2.99 MIL/uL — ABNORMAL LOW (ref 4.22–5.81)
RBC: 3.16 MIL/uL — ABNORMAL LOW (ref 4.22–5.81)
RDW: 15.8 % — AB (ref 11.5–15.5)
RDW: 16.2 % — AB (ref 11.5–15.5)
WBC: 6.9 10*3/uL (ref 4.0–10.5)
WBC: 9.1 10*3/uL (ref 4.0–10.5)

## 2014-08-12 LAB — URINALYSIS, ROUTINE W REFLEX MICROSCOPIC
Bilirubin Urine: NEGATIVE
Glucose, UA: NEGATIVE mg/dL
KETONES UR: NEGATIVE mg/dL
NITRITE: NEGATIVE
PH: 6 (ref 5.0–8.0)
Protein, ur: 100 mg/dL — AB
Specific Gravity, Urine: 1.013 (ref 1.005–1.030)
UROBILINOGEN UA: 0.2 mg/dL (ref 0.0–1.0)

## 2014-08-12 LAB — BASIC METABOLIC PANEL
Anion gap: 13 (ref 5–15)
BUN: 26 mg/dL — AB (ref 6–23)
CALCIUM: 9.1 mg/dL (ref 8.4–10.5)
CHLORIDE: 103 meq/L (ref 96–112)
CO2: 22 mEq/L (ref 19–32)
CREATININE: 1.27 mg/dL (ref 0.50–1.35)
GFR calc non Af Amer: 55 mL/min — ABNORMAL LOW (ref >90)
GFR, EST AFRICAN AMERICAN: 63 mL/min — AB (ref >90)
Glucose, Bld: 138 mg/dL — ABNORMAL HIGH (ref 70–99)
Potassium: 4.3 mEq/L (ref 3.7–5.3)
Sodium: 138 mEq/L (ref 137–147)

## 2014-08-12 LAB — PROTIME-INR
INR: 1.08 (ref 0.00–1.49)
Prothrombin Time: 14.1 seconds (ref 11.6–15.2)

## 2014-08-12 LAB — URINE MICROSCOPIC-ADD ON

## 2014-08-12 LAB — APTT: aPTT: 28 seconds (ref 24–37)

## 2014-08-12 MED ORDER — LIDOCAINE HCL 2 % EX GEL
1.0000 "application " | Freq: Once | CUTANEOUS | Status: AC
  Administered 2014-08-12: 1 via URETHRAL
  Filled 2014-08-12: qty 10

## 2014-08-12 NOTE — ED Provider Notes (Signed)
CSN: [id number]     Arrival date & time 08/12/14  0736 History   First MD Initiated Contact with Patient 08/12/14 [phone]     Chief Complaint  Patient presents with  . Urinary Retention     (Consider location/radiation/quality/duration/timing/severity/associated sxs/prior Treatment) HPI Comments: Patient is a 73 year old male with history of pulmonary embolism. He was recently treated with xarelto, then began to bleed into his urine.  He had a ureteral stent placed by Dr.Dahlstadt. He was discharged 1 week ago and was doing well until yesterday evening. He noticed a small clot in his urine, then today developed suprapubic discomfort and an inability to urinate. He denies any fevers or chills.  He is currently not taking any anticoagulation, but did have a IVC filter placed due to his bleeding and inability to be anticoagulated.  Patient is a 73 y.o. male presenting with hematuria. The history is provided by the patient.  Hematuria This is a recurrent problem. The current episode started yesterday. The problem occurs constantly. The problem has been rapidly worsening. Associated symptoms include abdominal pain. Pertinent negatives include no chest pain. Nothing aggravates the symptoms. Nothing relieves the symptoms. He has tried nothing for the symptoms. The treatment provided no relief.    Past Medical History  Diagnosis Date  . Hypertension   . Hypercholesteremia   . Glucose intolerance (impaired glucose tolerance)   . Kidney stones, calcium oxalate   . CAD (coronary artery disease)   . 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 Adenomatous colon polyp     Dr. Amedeo Plenty  . Prostate cancer 2009    Dr. Eulogio Ditch; tx w/ radium implants  . Lung cancer    Past Surgical History  Procedure Laterality Date  . Lobectomy  1999    left - Dr. Cyndia Bent  . Repair right testicular torsion  1962  . Cardiac catheterization  1997    Dr. Martinique  . Prostate radium implants  2009  . Lung cancer  surgery  2003  . Colonoscopy  ;11/12/99;04/29/03    12/27/09  . Lumbar laminectomy/decompression microdiscectomy  08/10/2012    Procedure: LUMBAR LAMINECTOMY/DECOMPRESSION MICRODISCECTOMY 1 LEVEL;  Surgeon: Winfield Cunas, MD;  Location: [facility];  Service: Neurosurgery;  Laterality: Left;  LEFT Lumbar five-sacral one microdiskectomy  . Cystoscopy with retrograde pyelogram, ureteroscopy and stent placement Right 07/31/2014    Procedure: CYSTOSCOPY WITH RETROGRADE PYELOGRAM, URETEROSCOPY WITH FULGURATION AND STENT PLACEMENT;  Surgeon: [name], MD;  Location: [facility];  Service: Urology;  Laterality: Right;   Family History  Problem Relation Age of Onset  . Heart disease Mother   . Hypertension Mother   . Heart disease Father   . Hypertension Father   . Stroke Father   . Other Brother     spinal meningitis  . Other Brother     MVA  . Cancer Sister     stomach   History  Substance Use Topics  . Smoking status: Former Smoker -- 2.00 packs/day for 35 years    Types: Cigarettes    Quit date: 10/17/1982  . Smokeless tobacco: Former Systems developer    Types: Chew    Quit date: 03/15/2012  . Alcohol Use: 7.0 oz/week    14 drink(s) per week     Comment: 2 drinks a day before supper but has not had in 1 month    Review of Systems  Cardiovascular: Negative for chest pain.  Gastrointestinal: Positive for abdominal pain.  Genitourinary: Positive  for hematuria.  All other systems reviewed and are negative.     Allergies  Codeine and Ibuprofen  Home Medications   Prior to Admission medications   Medication Sig Start Date End Date Taking? Authorizing Provider  acetaminophen (TYLENOL) 500 MG tablet Take 500 mg by mouth every 6 (six) hours as needed for moderate pain (pain).     Historical Provider, MD  allopurinol (ZYLOPRIM) 100 MG tablet Take 100 mg by mouth Daily. 07/23/12   Historical Provider, MD  ALPRAZolam Duanne Moron) 0.5 MG tablet Take 0.5 mg by mouth 3 (three) times daily as  needed for anxiety (anxiety).     Historical Provider, MD  atorvastatin (LIPITOR) 40 MG tablet Take 40 mg by mouth every morning.     Historical Provider, MD  cephALEXin (KEFLEX) 500 MG capsule Take 1 capsule (500 mg total) by mouth every 12 (twelve) hours. 08/05/14   Robbie Lis, MD  Cyanocobalamin (VITAMIN B 12 PO) Take 1,000 mcg by mouth daily.      Historical Provider, MD  feeding supplement, ENSURE COMPLETE, (ENSURE COMPLETE) LIQD Take 237 mLs by mouth 2 (two) times daily between meals. 08/05/14   Robbie Lis, MD  folic acid (FOLVITE) 1 MG tablet Take 1 mg by mouth daily.    Historical Provider, MD  glucosamine-chondroitin 500-400 MG tablet Take 1 tablet by mouth 2 (two) times daily.     Historical Provider, MD  methotrexate (RHEUMATREX) 2.5 MG tablet Take 15 mg by mouth every Wednesday. Caution:Chemotherapy. Protect from light.    Historical Provider, MD  nitroGLYCERIN (NITROSTAT) 0.4 MG SL tablet Place 0.4 mg under the tongue every 5 (five) minutes as needed. For chest pain    Historical Provider, MD  oxybutynin (DITROPAN) 5 MG tablet Take 1 tablet (5 mg total) by mouth every 8 (eight) hours as needed (urinary frequency/urgency). 08/05/14   Robbie Lis, MD  tamsulosin (FLOMAX) 0.4 MG CAPS capsule Take 1 capsule (0.4 mg total) by mouth daily. 08/05/14   Robbie Lis, MD  traMADol (ULTRAM) 50 MG tablet Take 1 tablet (50 mg total) by mouth every 6 (six) hours as needed for moderate pain (if response to Tylenol insufficient). 08/05/14   Robbie Lis, MD  verapamil (CALAN-SR) 240 MG CR tablet Take 240 mg by mouth 2 (two) times daily.      Historical Provider, MD   BP 117/68  Pulse 115  Temp(Src) 97.8 F (36.6 C) (Oral)  Resp 20  Ht 5\' 6"  (1.676 m)  Wt 187 lb (84.823 kg)  BMI 30.20 kg/m2  SpO2 97% Physical Exam  Nursing note reviewed. Constitutional: He is oriented to person, place, and time. He appears well-developed and well-nourished. No distress.  HENT:  Head: Normocephalic  and atraumatic.  Neck: Normal range of motion. Neck supple.  Cardiovascular: Normal rate, regular rhythm and normal heart sounds.   No murmur heard. Pulmonary/Chest: Effort normal and breath sounds normal. No respiratory distress. He has no wheezes.  Abdominal: Soft. Bowel sounds are normal. He exhibits distension. There is tenderness.  There is suprapubic fullness and tenderness to palpation.  Genitourinary: Penis normal. No penile tenderness.  Musculoskeletal: Normal range of motion. He exhibits no edema.  Neurological: He is alert and oriented to person, place, and time.  Skin: Skin is warm and dry. He is not diaphoretic.    ED Course  Procedures (including critical care time) Labs Review Labs Reviewed - No data to display  Imaging Review No results found.   EKG  Interpretation None      MDM   Final diagnoses:  None    Foley catheter was inserted and 600 mL of red urine with small clots was obtained. Laboratory studies showed creatinine of 1.2 and hemoglobin of 10.4. I discussed these results  And situation with Dr.Dahlstedt from urology who is recommending leaving the Foley in place connected to a leg bag and follow-up in his office this week. Patient is comfortable with this treatment plan and disposition and will be discharged to home. To return as needed for any problems.    Veryl Speak, MD 08/12/14 [phone]

## 2014-08-12 NOTE — ED Notes (Signed)
Per pt, here recently with PE.  Pt has had hematuria for 4 weeks and had his thinners stopped x 2 weeks.    Had cath removed last Wednesday.  Pt urine was clear.  Yesterday pt had blood clot in urine.  Pt c/o today having the inability to urinate.  Last void at midnight.  Pt having slight blood discharge only.

## 2014-08-12 NOTE — Discharge Instructions (Signed)
Dr. Alan Ripper office will call you to arrange a follow-up appointment for this week.  Return to the emergency department if you develop a worsening of your symptoms.   Hematuria Hematuria is blood in your urine. It can be caused by a bladder infection, kidney infection, prostate infection, kidney stone, or cancer of your urinary tract. Infections can usually be treated with medicine, and a kidney stone usually will pass through your urine. If neither of these is the cause of your hematuria, further workup to find out the reason may be needed. It is very important that you tell your health care provider about any blood you see in your urine, even if the blood stops without treatment or happens without causing pain. Blood in your urine that happens and then stops and then happens again can be a symptom of a very serious condition. Also, pain is not a symptom in the initial stages of many urinary cancers. HOME CARE INSTRUCTIONS   Drink lots of fluid, 3-4 quarts a day. If you have been diagnosed with an infection, cranberry juice is especially recommended, in addition to large amounts of water.  Avoid caffeine, tea, and carbonated beverages because they tend to irritate the bladder.  Avoid alcohol because it may irritate the prostate.  Take all medicines as directed by your health care provider.  If you were prescribed an antibiotic medicine, finish it all even if you start to feel better.  If you have been diagnosed with a kidney stone, follow your health care provider's instructions regarding straining your urine to catch the stone.  Empty your bladder often. Avoid holding urine for long periods of time.  After a bowel movement, women should cleanse front to back. Use each tissue only once.  Empty your bladder before and after sexual intercourse if you are a male. SEEK MEDICAL CARE IF:  You develop back pain.  You have a fever.  You have a feeling of sickness in your stomach  (nausea) or vomiting.  Your symptoms are not better in 3 days. Return sooner if you are getting worse. SEEK IMMEDIATE MEDICAL CARE IF:   You develop severe vomiting and are unable to keep the medicine down.  You develop severe back or abdominal pain despite taking your medicines.  You begin passing a large amount of blood or clots in your urine.  You feel extremely weak or faint, or you pass out. MAKE SURE YOU:   Understand these instructions.  Will watch your condition.  Will get help right away if you are not doing well or get worse. Document Released: 10/03/2005 Document Revised: 02/17/2014 Document Reviewed: 06/03/2013 [facility] Patient Information 2015 [place], [place]. This information is not intended to replace advice given to you by your health care provider. Make sure you discuss any questions you have with your health care provider.

## 2014-08-12 NOTE — ED Notes (Signed)
MD at bedside. 

## 2014-08-12 NOTE — ED Notes (Addendum)
Patient reports being recently hospitalized for pulmonary embolism. Yesterday noticed he passed a small, sanguineous clot in his urine yesterday. Seen here at 0600 this morning and put out approximately 600 cc per urethral catheter after being placed this morning.  Urologist (placed kidney stents recently) told MD today to send patient home with catheter and he would set up appointment for him to see him this week. Dumped output x3. Since 1800 has had no urinary output. Was given Flomax prescription this morning. Took Flomax this afternoon around 1700. Feels intermittent urinary urge. Denies back pain. Having slight leakage around catheter. No other problems/concerns. Awaiting MD/PA.

## 2014-08-12 NOTE — ED Provider Notes (Addendum)
CSN: [id number]     Arrival date & time 08/12/14  2200 History   First MD Initiated Contact with Patient 08/12/14 2250     Chief Complaint  Patient presents with  . Urethral Catheter Issues      (Consider location/radiation/quality/duration/timing/severity/associated sxs/prior Treatment) HPI Patient has been having ongoing problems with urinary retention and hematuria since starting Xarelto. He did have to have a ureteral stent placed and a Foley catheter placed. The patient is also required CBI for hematuria with clots. The patient just had a catheter changed this morning and initially was emptying urinalysis until 6 PM tonight. He reports that it stopped emptying and there is only some very rusty small amount of material in the bag. His abdomen has become distended and he is experiencing a lot of pressure. The patient is on anticoagulants and has a venous filter placed for recurrent PE. The patient has no complaints of chest pain or shortness of breath. Past Medical History  Diagnosis Date  . Hypertension   . Hypercholesteremia   . Glucose intolerance (impaired glucose tolerance)   . Kidney stones, calcium oxalate   . CAD (coronary artery disease)   . 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 Adenomatous colon polyp     Dr. Amedeo Plenty  . Prostate cancer 2009    Dr. Eulogio Ditch; tx w/ radium implants  . Lung cancer    Past Surgical History  Procedure Laterality Date  . Lobectomy  1999    left - Dr. Cyndia Bent  . Repair right testicular torsion  1962  . Cardiac catheterization  1997    Dr. Martinique  . Prostate radium implants  2009  . Lung cancer surgery  2003  . Colonoscopy  ;11/12/99;04/29/03    12/27/09  . Lumbar laminectomy/decompression microdiscectomy  08/10/2012    Procedure: LUMBAR LAMINECTOMY/DECOMPRESSION MICRODISCECTOMY 1 LEVEL;  Surgeon: Winfield Cunas, MD;  Location: [place] [facility];  Service: Neurosurgery;  Laterality: Left;  LEFT Lumbar five-sacral one microdiskectomy  .  Cystoscopy with retrograde pyelogram, ureteroscopy and stent placement Right 07/31/2014    Procedure: CYSTOSCOPY WITH RETROGRADE PYELOGRAM, URETEROSCOPY WITH FULGURATION AND STENT PLACEMENT;  Surgeon: [name], MD;  Location: [facility];  Service: Urology;  Laterality: Right;   Family History  Problem Relation Age of Onset  . Heart disease Mother   . Hypertension Mother   . Heart disease Father   . Hypertension Father   . Stroke Father   . Other Brother     spinal meningitis  . Other Brother     MVA  . Cancer Sister     stomach   History  Substance Use Topics  . Smoking status: Former Smoker -- 2.00 packs/day for 35 years    Types: Cigarettes    Quit date: 10/17/1982  . Smokeless tobacco: Former Systems developer    Types: Chew    Quit date: 03/15/2012  . Alcohol Use: 7.0 oz/week    14 drink(s) per week     Comment: 2 drinks a day before supper but has not had in 1 month    Review of Systems  10 Systems reviewed and are negative for acute change except as noted in the HPI.   Allergies  Codeine and Ibuprofen  Home Medications   Prior to Admission medications   Medication Sig Start Date End Date Taking? Authorizing Provider  acetaminophen (TYLENOL) 500 MG tablet Take 500 mg by mouth every 6 (six) hours as needed for moderate pain (pain).  Yes Historical Provider, MD  allopurinol (ZYLOPRIM) 100 MG tablet Take 100 mg by mouth Daily. 07/23/12  Yes Historical Provider, MD  ALPRAZolam Duanne Moron) 0.5 MG tablet Take 0.5 mg by mouth 3 (three) times daily as needed for anxiety (anxiety).    Yes Historical Provider, MD  atorvastatin (LIPITOR) 40 MG tablet Take 40 mg by mouth every morning.    Yes Historical Provider, MD  cephALEXin (KEFLEX) 500 MG capsule Take 1 capsule (500 mg total) by mouth every 12 (twelve) hours. 08/05/14  Yes Robbie Lis, MD  Cyanocobalamin (VITAMIN B 12 PO) Take 1,000 mcg by mouth daily.     Yes Historical Provider, MD  feeding supplement, ENSURE COMPLETE,  (ENSURE COMPLETE) LIQD Take 237 mLs by mouth daily.   Yes Historical Provider, MD  folic acid (FOLVITE) 1 MG tablet Take 1 mg by mouth daily.   Yes Historical Provider, MD  glucosamine-chondroitin 500-400 MG tablet Take 1 tablet by mouth 2 (two) times daily.    Yes Historical Provider, MD  oxybutynin (DITROPAN) 5 MG tablet Take 1 tablet (5 mg total) by mouth every 8 (eight) hours as needed (urinary frequency/urgency). 08/05/14  Yes Robbie Lis, MD  tamsulosin (FLOMAX) 0.4 MG CAPS capsule Take 1 capsule (0.4 mg total) by mouth daily. 08/05/14  Yes Robbie Lis, MD  traMADol (ULTRAM) 50 MG tablet Take 1 tablet (50 mg total) by mouth every 6 (six) hours as needed for moderate pain (if response to Tylenol insufficient). 08/05/14  Yes Robbie Lis, MD  verapamil (CALAN-SR) 240 MG CR tablet Take 240 mg by mouth 2 (two) times daily.     Yes Historical Provider, MD  nitroGLYCERIN (NITROSTAT) 0.4 MG SL tablet Place 0.4 mg under the tongue every 5 (five) minutes as needed. For chest pain    Historical Provider, MD   BP 131/59  Pulse 102  Temp(Src) 97.9 F (36.6 C) (Oral)  Resp 18  SpO2 97% Physical Exam  Constitutional: He is oriented to person, place, and time.  This is an obese but well-nourished gentleman. He is alert and interactive. He shows no respiratory distress. He is calm and appropriate.  HENT:  Head: Normocephalic and atraumatic.  Eyes: EOM are normal. Pupils are equal, round, and reactive to light.  Cardiovascular: Normal rate, regular rhythm and normal heart sounds.   Pulmonary/Chest: Effort normal and breath sounds normal.  Abdominal: He exhibits distension. There is tenderness.  Lower abdomen is distended and firm. He is moderate and diffusely tender.  Genitourinary:  The patient has a Foley catheter in place. There is a small amount of red blood surrounding it. There is no active bleeding present. His Foley catheter bag has a small amount of rusty urine and it with some  flocculent material only about a few cc.  Musculoskeletal: He exhibits edema. He exhibits no tenderness.  Neurological: He is alert and oriented to person, place, and time.  Skin: Skin is warm and dry.  Psychiatric: He has a normal mood and affect.    ED Course  Procedures (including critical care time) Labs Review Labs Reviewed  BASIC METABOLIC PANEL - Abnormal; Notable for the following:    Glucose, Bld 135 (*)    BUN 27 (*)    GFR calc non Af Amer 53 (*)    GFR calc Af Amer 62 (*)    All other components within normal limits  CBC WITH DIFFERENTIAL - Abnormal; Notable for the following:    RBC 2.99 (*)  Hemoglobin 10.2 (*)    HCT 30.9 (*)    MCV 103.3 (*)    MCH 34.1 (*)    RDW 16.2 (*)    All other components within normal limits  URINE CULTURE  APTT  PROTIME-INR  URINALYSIS, ROUTINE W REFLEX MICROSCOPIC    Imaging Review No results found.   EKG Interpretation None     Consult: Dr. Burman Nieves, discussed management and patient will be admitted to hospitalist with CBI overnight for reassessment in the morning. MDM   Final diagnoses:  Urinary retention  Anticoagulant-induced hematuria   Patient has had recurrent urinary retention due to clot formation. He has had a stent placed for ureteral stent stricture and clot within the right collecting system. The patient is on Xarelto and has had recurrent instances of clot-induced obstruction. Upon arrival he showed signs of retention without any urine output from his catheter. Three-way catheter has been placed and CBI initiated in emergency department. He does not have any acute complaints related to his known history of PE and no shortness of breath no fever no chills no chest pain.    Charlesetta Shanks, MD 08/13/14 [place], MD 08/13/14 [phone]

## 2014-08-12 NOTE — ED Notes (Signed)
Bed: OF12 Expected date:  Expected time:  Means of arrival:  Comments: Triage 1

## 2014-08-13 ENCOUNTER — Encounter (HOSPITAL_COMMUNITY): Payer: Self-pay | Admitting: *Deleted

## 2014-08-13 DIAGNOSIS — R339 Retention of urine, unspecified: Secondary | ICD-10-CM

## 2014-08-13 DIAGNOSIS — I2699 Other pulmonary embolism without acute cor pulmonale: Secondary | ICD-10-CM | POA: Diagnosis not present

## 2014-08-13 DIAGNOSIS — N1330 Unspecified hydronephrosis: Secondary | ICD-10-CM

## 2014-08-13 DIAGNOSIS — N135 Crossing vessel and stricture of ureter without hydronephrosis: Secondary | ICD-10-CM | POA: Diagnosis not present

## 2014-08-13 DIAGNOSIS — I10 Essential (primary) hypertension: Secondary | ICD-10-CM

## 2014-08-13 DIAGNOSIS — Z8551 Personal history of malignant neoplasm of bladder: Secondary | ICD-10-CM | POA: Diagnosis not present

## 2014-08-13 DIAGNOSIS — C61 Malignant neoplasm of prostate: Secondary | ICD-10-CM

## 2014-08-13 DIAGNOSIS — R319 Hematuria, unspecified: Secondary | ICD-10-CM

## 2014-08-13 DIAGNOSIS — Z95828 Presence of other vascular implants and grafts: Secondary | ICD-10-CM

## 2014-08-13 DIAGNOSIS — N3040 Irradiation cystitis without hematuria: Secondary | ICD-10-CM | POA: Diagnosis not present

## 2014-08-13 DIAGNOSIS — R310 Gross hematuria: Secondary | ICD-10-CM | POA: Diagnosis not present

## 2014-08-13 LAB — CBC
HCT: 27.7 % — ABNORMAL LOW (ref 39.0–52.0)
HEMOGLOBIN: 9.1 g/dL — AB (ref 13.0–17.0)
MCH: 33.5 pg (ref 26.0–34.0)
MCHC: 32.9 g/dL (ref 30.0–36.0)
MCV: 101.8 fL — AB (ref 78.0–100.0)
Platelets: 164 10*3/uL (ref 150–400)
RBC: 2.72 MIL/uL — AB (ref 4.22–5.81)
RDW: 16.1 % — ABNORMAL HIGH (ref 11.5–15.5)
WBC: 7.3 10*3/uL (ref 4.0–10.5)

## 2014-08-13 LAB — BASIC METABOLIC PANEL
ANION GAP: 15 (ref 5–15)
Anion gap: 12 (ref 5–15)
BUN: 25 mg/dL — AB (ref 6–23)
BUN: 27 mg/dL — AB (ref 6–23)
CALCIUM: 8.6 mg/dL (ref 8.4–10.5)
CO2: 21 mEq/L (ref 19–32)
CO2: 23 mEq/L (ref 19–32)
CREATININE: 1.21 mg/dL (ref 0.50–1.35)
CREATININE: 1.3 mg/dL (ref 0.50–1.35)
Calcium: 8.6 mg/dL (ref 8.4–10.5)
Chloride: 102 mEq/L (ref 96–112)
Chloride: 105 mEq/L (ref 96–112)
GFR calc Af Amer: 62 mL/min — ABNORMAL LOW (ref >90)
GFR calc non Af Amer: 53 mL/min — ABNORMAL LOW (ref >90)
GFR calc non Af Amer: 58 mL/min — ABNORMAL LOW (ref >90)
GFR, EST AFRICAN AMERICAN: 67 mL/min — AB (ref >90)
Glucose, Bld: 119 mg/dL — ABNORMAL HIGH (ref 70–99)
Glucose, Bld: 135 mg/dL — ABNORMAL HIGH (ref 70–99)
Potassium: 4.3 mEq/L (ref 3.7–5.3)
Potassium: 4.7 mEq/L (ref 3.7–5.3)
Sodium: 138 mEq/L (ref 137–147)
Sodium: 140 mEq/L (ref 137–147)

## 2014-08-13 LAB — URINALYSIS, ROUTINE W REFLEX MICROSCOPIC
Glucose, UA: NEGATIVE mg/dL
KETONES UR: NEGATIVE mg/dL
Nitrite: NEGATIVE
PH: 5.5 (ref 5.0–8.0)
Protein, ur: >300 mg/dL — AB
SPECIFIC GRAVITY, URINE: 1.023 (ref 1.005–1.030)
Urobilinogen, UA: 1 mg/dL (ref 0.0–1.0)

## 2014-08-13 LAB — URINE MICROSCOPIC-ADD ON

## 2014-08-13 MED ORDER — ATORVASTATIN CALCIUM 40 MG PO TABS
40.0000 mg | ORAL_TABLET | Freq: Every morning | ORAL | Status: DC
  Administered 2014-08-13 – 2014-08-16 (×4): 40 mg via ORAL
  Filled 2014-08-13 (×5): qty 1

## 2014-08-13 MED ORDER — TAMSULOSIN HCL 0.4 MG PO CAPS
0.4000 mg | ORAL_CAPSULE | Freq: Every day | ORAL | Status: DC
  Administered 2014-08-13 – 2014-08-17 (×5): 0.4 mg via ORAL
  Filled 2014-08-13 (×5): qty 1

## 2014-08-13 MED ORDER — ONDANSETRON HCL 4 MG/2ML IJ SOLN
4.0000 mg | INTRAMUSCULAR | Status: AC
  Administered 2014-08-13: 4 mg via INTRAVENOUS
  Filled 2014-08-13: qty 2

## 2014-08-13 MED ORDER — VERAPAMIL HCL ER 240 MG PO TBCR
240.0000 mg | EXTENDED_RELEASE_TABLET | Freq: Two times a day (BID) | ORAL | Status: DC
  Administered 2014-08-13 – 2014-08-17 (×7): 240 mg via ORAL
  Filled 2014-08-13 (×10): qty 1

## 2014-08-13 MED ORDER — ONDANSETRON HCL 4 MG/2ML IJ SOLN: 4.0000 mg | Freq: Four times a day (QID) | INTRAMUSCULAR | Status: DC | PRN

## 2014-08-13 MED ORDER — OXYBUTYNIN CHLORIDE 5 MG PO TABS
5.0000 mg | ORAL_TABLET | Freq: Three times a day (TID) | ORAL | Status: DC | PRN
  Administered 2014-08-13 – 2014-08-15 (×5): 5 mg via ORAL
  Filled 2014-08-13 (×4): qty 1

## 2014-08-13 MED ORDER — SODIUM CHLORIDE 0.9 % IJ SOLN
3.0000 mL | Freq: Two times a day (BID) | INTRAMUSCULAR | Status: DC
  Administered 2014-08-13 – 2014-08-17 (×9): 3 mL via INTRAVENOUS

## 2014-08-13 MED ORDER — FOLIC ACID 1 MG PO TABS
1.0000 mg | ORAL_TABLET | Freq: Every day | ORAL | Status: DC
Start: 2014-08-13 — End: 2014-08-17
  Administered 2014-08-13 – 2014-08-17 (×5): 1 mg via ORAL
  Filled 2014-08-13 (×5): qty 1

## 2014-08-13 MED ORDER — MORPHINE SULFATE 4 MG/ML IJ SOLN
4.0000 mg | INTRAMUSCULAR | Status: AC
  Administered 2014-08-13: 4 mg via INTRAVENOUS
  Filled 2014-08-13: qty 1

## 2014-08-13 MED ORDER — ENSURE COMPLETE PO LIQD
237.0000 mL | Freq: Every day | ORAL | Status: DC
  Administered 2014-08-14 – 2014-08-17 (×4): 237 mL via ORAL

## 2014-08-13 MED ORDER — SODIUM CHLORIDE 0.9 % IJ SOLN: 3.0000 mL | INTRAMUSCULAR | Status: DC | PRN

## 2014-08-13 MED ORDER — CIPROFLOXACIN HCL 500 MG PO TABS
500.0000 mg | ORAL_TABLET | Freq: Two times a day (BID) | ORAL | Status: AC
Start: 2014-08-13 — End: 2014-08-13
  Administered 2014-08-13 (×2): 500 mg via ORAL
  Filled 2014-08-13 (×2): qty 1

## 2014-08-13 MED ORDER — SODIUM CHLORIDE 0.9 % IR SOLN
3000.0000 mL | Status: DC
  Administered 2014-08-14: 3000 mL via INTRAVESICAL
  Administered 2014-08-14: 1000 mL via INTRAVESICAL
  Administered 2014-08-15: 3000 mL via INTRAVESICAL

## 2014-08-13 MED ORDER — ACETAMINOPHEN 500 MG PO TABS
500.0000 mg | ORAL_TABLET | Freq: Four times a day (QID) | ORAL | Status: DC | PRN
  Administered 2014-08-13 – 2014-08-14 (×4): 500 mg via ORAL
  Filled 2014-08-13 (×4): qty 1

## 2014-08-13 MED ORDER — SODIUM CHLORIDE 0.9 % IR SOLN
Status: DC
  Administered 2014-08-13 – 2014-08-14 (×2): via INTRAVESICAL
  Filled 2014-08-13 (×3): qty 40

## 2014-08-13 MED ORDER — ALPRAZOLAM 0.5 MG PO TABS
0.5000 mg | ORAL_TABLET | Freq: Three times a day (TID) | ORAL | Status: DC | PRN
  Administered 2014-08-13 – 2014-08-16 (×3): 0.5 mg via ORAL
  Filled 2014-08-13 (×3): qty 1

## 2014-08-13 MED ORDER — SODIUM CHLORIDE 0.9 % IV SOLN: 250.0000 mL | INTRAVENOUS | Status: DC | PRN

## 2014-08-13 MED ORDER — SODIUM CHLORIDE 0.9 % IJ SOLN: 500.0000 ug | Freq: Four times a day (QID) | INTRAMUSCULAR | Status: DC

## 2014-08-13 NOTE — Progress Notes (Signed)
UR completed 

## 2014-08-13 NOTE — Consult Note (Signed)
I have been asked to see the patient by Dr. Marvia Pickles, MD, for evaluation and management of gross hematuria.  History of present illness: Patient well known to urology, has radiation cystitis and recently found to have ureteral stricture with subsequent stent ureteral stent placement. The patient has a history of prostate cancer, and underwent radiation therapy in September 2009.  Was discharged 1 week ago when he was hospitalized with gross hematuria. Ultimately, the patient received several units of packed red blood cells, IVC filter was placed and Xarelto was held. Completed abx for empiric UTI coverage 4 days ago. Was seen in ED yesterday AM for hematuria and small catheter placed. Returned again last evening with clot retention. The patient relates that his urine had been pink but he did pass a long stringy clot the evening prior to his presentation to the emergency department yesterday morning. He awoke yesterday morning unable to urinate with severe abdominal/suprapubic pressure. At the time of the catheter placement in the emergency department in the morning he had blood-tinged urine which had been draining quite well.  Yesterday evening again he noted no urine in his catheter bag and more suprapubic pressure. He presented again to the emergency department where the smaller catheter was removed and a 24 French three-way Foley catheter was placed. At the time of placement of the catheter darker urine was noted. He was hand irrigated in the emergency department and then transferred to the floor where he began continuous bladder irrigation. Prior to his clot retention, the patient denies any dysuria or worsening voiding symptoms. He denies any fevers or chills. He does state that he had some constipation one day prior to this episode.  Review of systems: A 12 point comprehensive review of systems was obtained and is negative unless otherwise stated in the history of present illness.  Patient Active  Problem List   Diagnosis Date Noted  . Urinary retention 08/13/2014  . S/P IVC filter 08/13/2014  . Hydronephrosis, right 07/27/2014  . Chronic anticoagulation 07/27/2014  . Acute blood loss anemia 07/27/2014  . Acute renal failure 07/27/2014  . Loss of weight 07/27/2014  . Obesity (BMI 30-39.9) 07/27/2014  . Hematuria 07/26/2014  . Embolism, pulmonary with infarction 07/15/2014  . Rheumatoid arthritis 07/15/2014  . HNP (herniated nucleus pulposus), lumbar 08/10/2012  . AKI (acute kidney injury) with ATN 02/09/2012  . Hypertension   . Hypercholesteremia   . Glucose intolerance (impaired glucose tolerance)   . Kidney stones, calcium oxalate   . CAD (coronary artery disease)   . Adenosquamous carcinoma of lung   . Adenomatous colon polyp   . Prostate cancer     No current facility-administered medications on file prior to encounter.   Current Outpatient Prescriptions on File Prior to Encounter  Medication Sig Dispense Refill  . acetaminophen (TYLENOL) 500 MG tablet Take 500 mg by mouth every 6 (six) hours as needed for moderate pain (pain).       Marland Kitchen allopurinol (ZYLOPRIM) 100 MG tablet Take 100 mg by mouth Daily.      Marland Kitchen ALPRAZolam (XANAX) 0.5 MG tablet Take 0.5 mg by mouth 3 (three) times daily as needed for anxiety (anxiety).       Marland Kitchen atorvastatin (LIPITOR) 40 MG tablet Take 40 mg by mouth every morning.       . cephALEXin (KEFLEX) 500 MG capsule Take 1 capsule (500 mg total) by mouth every 12 (twelve) hours.  6 capsule  0  . Cyanocobalamin (VITAMIN B 12 PO) Take  1,000 mcg by mouth daily.        . feeding supplement, ENSURE COMPLETE, (ENSURE COMPLETE) LIQD Take 237 mLs by mouth daily.      . folic acid (FOLVITE) 1 MG tablet Take 1 mg by mouth daily.      Marland Kitchen glucosamine-chondroitin 500-400 MG tablet Take 1 tablet by mouth 2 (two) times daily.       Marland Kitchen oxybutynin (DITROPAN) 5 MG tablet Take 1 tablet (5 mg total) by mouth every 8 (eight) hours as needed (urinary frequency/urgency).  30  tablet  0  . tamsulosin (FLOMAX) 0.4 MG CAPS capsule Take 1 capsule (0.4 mg total) by mouth daily.  30 capsule  0  . traMADol (ULTRAM) 50 MG tablet Take 1 tablet (50 mg total) by mouth every 6 (six) hours as needed for moderate pain (if response to Tylenol insufficient).  15 tablet  0  . verapamil (CALAN-SR) 240 MG CR tablet Take 240 mg by mouth 2 (two) times daily.        . nitroGLYCERIN (NITROSTAT) 0.4 MG SL tablet Place 0.4 mg under the tongue every 5 (five) minutes as needed. For chest pain        Past Medical History  Diagnosis Date  . Hypertension   . Hypercholesteremia   . Glucose intolerance (impaired glucose tolerance)   . Kidney stones, calcium oxalate   . CAD (coronary artery disease)   . 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 Adenomatous colon polyp     Dr. Amedeo Plenty  . Prostate cancer 2009    Dr. Eulogio Ditch; tx w/ radium implants  . Lung cancer     Past Surgical History  Procedure Laterality Date  . Lobectomy  1999    left - Dr. Cyndia Bent  . Repair right testicular torsion  1962  . Cardiac catheterization  1997    Dr. Martinique  . Prostate radium implants  2009  . Lung cancer surgery  2003  . Colonoscopy  ;11/12/99;04/29/03    12/27/09  . Lumbar laminectomy/decompression microdiscectomy  08/10/2012    Procedure: LUMBAR LAMINECTOMY/DECOMPRESSION MICRODISCECTOMY 1 LEVEL;  Surgeon: Winfield Cunas, MD;  Location: [place] [facility];  Service: Neurosurgery;  Laterality: Left;  LEFT Lumbar five-sacral one microdiskectomy  . Cystoscopy with retrograde pyelogram, ureteroscopy and stent placement Right 07/31/2014    Procedure: CYSTOSCOPY WITH RETROGRADE PYELOGRAM, URETEROSCOPY WITH FULGURATION AND STENT PLACEMENT;  Surgeon: [name], MD;  Location: [facility];  Service: Urology;  Laterality: Right;    History  Substance Use Topics  . Smoking status: Former Smoker -- 2.00 packs/day for 35 years    Types: Cigarettes    Quit date: 10/17/1982  . Smokeless tobacco: Former  Systems developer    Types: Chew    Quit date: 03/15/2012  . Alcohol Use: 7.0 oz/week    14 drink(s) per week     Comment: 2 drinks a day before supper but has not had in 1 month    Family History  Problem Relation Age of Onset  . Heart disease Mother   . Hypertension Mother   . Heart disease Father   . Hypertension Father   . Stroke Father   . Other Brother     spinal meningitis  . Other Brother     MVA  . Cancer Sister     stomach    PE: Filed Vitals:   08/12/14 2204 08/13/14 0106 08/13/14 0141 08/13/14 0534  BP: 131/59 103/59 123/66 99/52  Pulse: 102 74  84 76  Temp: 97.9 F (36.6 C) 98.1 F (36.7 C) 98.2 F (36.8 C) 98.4 F (36.9 C)  TempSrc: Oral Oral Oral Oral  Resp: 18 16 20 20   Height:   5\' 6"  (1.676 m)   Weight:   84.82 kg (186 lb 15.9 oz)   SpO2: 97% 95% 97% 94%   Patient appears to be in no acute distress  patient is alert and oriented x3 Atraumatic normocephalic head No cervical or supraclavicular lymphadenopathy appreciated No increased work of breathing, no audible wheezes/rhonchi Regular sinus rhythm/rate Abdomen is soft, nontender, nondistended, no CVA or suprapubic tenderness Lower extremities are symmetric without appreciable edema Grossly neurologically intact No identifiable skin lesions   Recent Labs  08/12/14 0843 08/12/14 2333 08/13/14 0332  WBC 6.9 9.1 7.3  HGB 10.5* 10.2* 9.1*  HCT 32.3* 30.9* 27.7*    Recent Labs  08/12/14 0843 08/12/14 2333 08/13/14 0332  NA 138 138 140  K 4.3 4.7 4.3  CL 103 102 105  CO2 22 21 23   GLUCOSE 138* 135* 119*  BUN 26* 27* 25*  CREATININE 1.27 1.30 1.21  CALCIUM 9.1 8.6 8.6    Recent Labs  08/12/14 2333  INR 1.08   No results found for this basename: LABURIN,  in the last 72 hours Results for orders placed during the hospital encounter of 07/14/14  CULTURE, BLOOD (ROUTINE X 2)     Status: None   Collection Time    07/14/14  7:01 PM      Result Value Ref Range Status   Specimen Description  BLOOD RIGHT ARM   Final   Special Requests BOTTLES DRAWN AEROBIC AND ANAEROBIC 10CC   Final   Culture  Setup Time     Final   Value: 07/15/2014 00:36     Performed at [facility]   Culture     Final   Value: NO GROWTH 5 DAYS     Note: Culture results may be compromised due to an excessive volume of blood received in culture bottles.     Performed at [facility]   Report Status 07/21/2014 FINAL   Final  CULTURE, BLOOD (ROUTINE X 2)     Status: None   Collection Time    07/14/14  7:14 PM      Result Value Ref Range Status   Specimen Description BLOOD HAND RIGHT   Final   Special Requests BOTTLES DRAWN AEROBIC AND ANAEROBIC 5CC   Final   Culture  Setup Time     Final   Value: 07/15/2014 00:30     Performed at [facility]   Culture     Final   Value: NO GROWTH 5 DAYS     Performed at [facility]   Report Status 07/21/2014 FINAL   Final    Imaging: none  Imp: Recurrent gross hematuria likely secondary to radiation cystitis in combination with irritation from his ureteral stent. According to the patient his hematuria did not seem particularly worrisome initially compared to previous episodes. He may have just had some residual/oh clot that caused him to develop clot retention initially. He may also have a concomitant urinary tract infection and a urine culture is pending. Currently, the patient's hematuria is improving, his urine is a light pink on a slow continuous bladder irrigation drip. Recommendations: Would continue to hold his anticoagulation at this time. Would recommend placing the patient on 24 hours of antibiotics given manipulation of his bladder/urethra and then following  up his urine cultures continuing antibiotics if indicated. Currently, there is no indication for cystoscopy and clot evacuation/fulguration in the operating room. Would wean his continuous bladder irrigation to off over the course of the next 24 hours if able. Depending on  how the patient's urine appears off CBI we may be able to remove his catheter prior to him being discharged. I will defer this to Dr Diona Fanti.   Louis Meckel W

## 2014-08-13 NOTE — Progress Notes (Signed)
CARE MANAGEMENT NOTE 08/13/2014  Patient:  Daniel Vega, Daniel Vega   Account Number:  [id number]  Date Initiated:  08/13/2014  Documentation initiated by:  [facility]  Subjective/Objective Assessment:   73 Y/O M ADMITTED W/HEMATURIA.     Action/Plan:   FROM HOME W/SPOUSE.HAS CANE, RW,3N1.ACTIVE W/HAC-HHPT.   Anticipated DC Date:  08/14/2014   Anticipated DC Plan:  [place]  CM consult      [facility] Choice  Resumption Of Svcs/PTA Provider   Choice offered to / List presented to:  C-1 Patient           Status of service:  In process, will continue to follow Medicare Important Message given?   (If response is "NO", the following Medicare IM given date fields will be blank) Date Medicare IM given:   Medicare IM given by:   Date Additional Medicare IM given:   Additional Medicare IM given by:    Discharge Disposition:    Per UR Regulation:  Reviewed for med. necessity/level of care/duration of stay  If discussed at [place] of Stay Meetings, dates discussed:    Comments:  08/13/14 [name] RN,BSN NCM [place] W/HHPT.AWAIT FINAL HHC ORDER, & FACE TO FACE.

## 2014-08-13 NOTE — ED Notes (Signed)
Hospitalist at bedside 

## 2014-08-13 NOTE — Progress Notes (Signed)
Daniel Vega has recurrent hematuria. This may be coming from the right renal pelvis or, more likely, his bladder neck from post radiation hemorrhage. I will order a repeat CT of his abdomen and pelvis to followup his right renal pelvis with the previous noted filling defect. Additionally, I will start him on a prostaglandin drip for his bladder.

## 2014-08-13 NOTE — Progress Notes (Signed)
Chaplain provided support at bedside due to rehospitalization.  Pt and wife present.  Engaged in narrative review and emotional support.  Presented advance directive.  Pt states he has one at home, but he needs to update.  Will follow up to complete advance directive with pt and spouse while admitted.   [place], [place]

## 2014-08-13 NOTE — Progress Notes (Addendum)
Pt admitted after midnight. Please see earlier admission note by Dr. Shanon Brow. PT clinically stable. Presented with persistent hematuria, has been on Xarelto for recent PE. Urology following. CBC and BMP in AM.  Faye Ramsay, MD  [facility] Pager [phone] Cell [phone]  If 7PM-7AM, please contact night-coverage [email or web address] Password [email or web address]

## 2014-08-13 NOTE — H&P (Signed)
PCP:   Marjorie Smolder, MD   Chief Complaint:  Blood in urine  HPI: 73 yo male recent h/o PE end of sept 2015 was placed on xaralto (prior to this he had some very mild hematuria), and within couple of weeks he developed gross hematuria and urinary retention.  He was rehospitalized beginning of October at which time he had extensive w/u by urology including rt stent placed for right hydronephrosis.  Was tx for uti and had ivc filter placed.  He went home (has been OFF xaralto AND aspirin, EDP reported his pcp had restarted his xaralto last week but this is NOT the case) and was doing well.  His urine was clear but then in the last 2 days he started having some dark urine again, progressed to gross blood then he could not urinate.  He denies any abd pain, no fevers.  No n/v/d.  No swelling in his legs.  No sob or chest pain.    Review of Systems:  Positive and negative as per HPI otherwise all other systems are negative  Past Medical History: Past Medical History  Diagnosis Date  . Hypertension   . Hypercholesteremia   . Glucose intolerance (impaired glucose tolerance)   . Kidney stones, calcium oxalate   . CAD (coronary artery disease)   . 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 Adenomatous colon polyp     Dr. Amedeo Plenty  . Prostate cancer 2009    Dr. Eulogio Ditch; tx w/ radium implants  . Lung cancer    Past Surgical History  Procedure Laterality Date  . Lobectomy  1999    left - Dr. Cyndia Bent  . Repair right testicular torsion  1962  . Cardiac catheterization  1997    Dr. Martinique  . Prostate radium implants  2009  . Lung cancer surgery  2003  . Colonoscopy  ;11/12/99;04/29/03    12/27/09  . Lumbar laminectomy/decompression microdiscectomy  08/10/2012    Procedure: LUMBAR LAMINECTOMY/DECOMPRESSION MICRODISCECTOMY 1 LEVEL;  Surgeon: Winfield Cunas, MD;  Location: [facility];  Service: Neurosurgery;  Laterality: Left;  LEFT Lumbar five-sacral one microdiskectomy  . Cystoscopy  with retrograde pyelogram, ureteroscopy and stent placement Right 07/31/2014    Procedure: CYSTOSCOPY WITH RETROGRADE PYELOGRAM, URETEROSCOPY WITH FULGURATION AND STENT PLACEMENT;  Surgeon: [name], MD;  Location: [facility];  Service: Urology;  Laterality: Right;    Medications: Prior to Admission medications   Medication Sig Start Date End Date Taking? Authorizing Provider  acetaminophen (TYLENOL) 500 MG tablet Take 500 mg by mouth every 6 (six) hours as needed for moderate pain (pain).    Yes Historical Provider, MD  allopurinol (ZYLOPRIM) 100 MG tablet Take 100 mg by mouth Daily. 07/23/12  Yes Historical Provider, MD  ALPRAZolam Duanne Moron) 0.5 MG tablet Take 0.5 mg by mouth 3 (three) times daily as needed for anxiety (anxiety).    Yes Historical Provider, MD  atorvastatin (LIPITOR) 40 MG tablet Take 40 mg by mouth every morning.    Yes Historical Provider, MD  cephALEXin (KEFLEX) 500 MG capsule Take 1 capsule (500 mg total) by mouth every 12 (twelve) hours. 08/05/14  Yes Robbie Lis, MD  Cyanocobalamin (VITAMIN B 12 PO) Take 1,000 mcg by mouth daily.     Yes Historical Provider, MD  feeding supplement, ENSURE COMPLETE, (ENSURE COMPLETE) LIQD Take 237 mLs by mouth daily.   Yes Historical Provider, MD  folic acid (FOLVITE) 1 MG tablet Take 1 mg by mouth daily.  Yes Historical Provider, MD  glucosamine-chondroitin 500-400 MG tablet Take 1 tablet by mouth 2 (two) times daily.    Yes Historical Provider, MD  oxybutynin (DITROPAN) 5 MG tablet Take 1 tablet (5 mg total) by mouth every 8 (eight) hours as needed (urinary frequency/urgency). 08/05/14  Yes Robbie Lis, MD  tamsulosin (FLOMAX) 0.4 MG CAPS capsule Take 1 capsule (0.4 mg total) by mouth daily. 08/05/14  Yes Robbie Lis, MD  traMADol (ULTRAM) 50 MG tablet Take 1 tablet (50 mg total) by mouth every 6 (six) hours as needed for moderate pain (if response to Tylenol insufficient). 08/05/14  Yes Robbie Lis, MD  verapamil (CALAN-SR)  240 MG CR tablet Take 240 mg by mouth 2 (two) times daily.     Yes Historical Provider, MD  nitroGLYCERIN (NITROSTAT) 0.4 MG SL tablet Place 0.4 mg under the tongue every 5 (five) minutes as needed. For chest pain    Historical Provider, MD    Allergies:   Allergies  Allergen Reactions  . Codeine Palpitations    Made heart hurt  . Ibuprofen Palpitations    Social History:  reports that he quit smoking about 31 years ago. His smoking use included Cigarettes. He has a 70 pack-year smoking history. He quit smokeless tobacco use about 2 years ago. His smokeless tobacco use included Chew. He reports that he drinks about 7 ounces of alcohol per week. He reports that he does not use illicit drugs.  Family History: Family History  Problem Relation Age of Onset  . Heart disease Mother   . Hypertension Mother   . Heart disease Father   . Hypertension Father   . Stroke Father   . Other Brother     spinal meningitis  . Other Brother     MVA  . Cancer Sister     stomach    Physical Exam: Filed Vitals:   08/12/14 2204 08/13/14 0106  BP: 131/59 103/59  Pulse: 102 74  Temp: 97.9 F (36.6 C) 98.1 F (36.7 C)  TempSrc: Oral Oral  Resp: 18 16  SpO2: 97% 95%   General appearance: alert, cooperative and no distress Head: Normocephalic, without obvious abnormality, atraumatic Eyes: negative Nose: Nares normal. Septum midline. Mucosa normal. No drainage or sinus tenderness. Neck: no JVD and supple, symmetrical, trachea midline Lungs: clear to auscultation bilaterally Heart: regular rate and rhythm, S1, S2 normal, no murmur, click, rub or gallop Abdomen: soft, non-tender; bowel sounds normal; no masses,  no organomegaly  Foley with gross blood Extremities: extremities normal, atraumatic, no cyanosis or edema Pulses: 2+ and symmetric Skin: Skin color, texture, turgor normal. No rashes or lesions Neurologic: Grossly normal    Labs on Admission:   Recent Labs  08/12/14 0843  08/12/14 2333  NA 138 138  K 4.3 4.7  CL 103 102  CO2 22 21  GLUCOSE 138* 135*  BUN 26* 27*  CREATININE 1.27 1.30  CALCIUM 9.1 8.6    Recent Labs  08/12/14 0843 08/12/14 2333  WBC 6.9 9.1  NEUTROABS 5.0 6.6  HGB 10.5* 10.2*  HCT 32.3* 30.9*  MCV 102.2* 103.3*  PLT 181 171   Radiological Exams on Admission:   Assessment/Plan  73 yo male with recurrent gross hematuria and urinary retention  Principal Problem:   Hematuria-  Urology has been called and will see in am.  Bladder irrigation being started in the ED.  ua does not look particularly infected.  W/u last hospitalization not clear etiology of why  he had hematuria then, from both right ureteral upper bleed and prostate/bladder neck from prior radiation possibly.  Will keep npo x midnight until seen by urology in case they wish repeat cystoscopy in the am.  Await further recs from urology team.  Active Problems:  Stable unless o /w noted   CAD (coronary artery disease)   Prostate cancer   Embolism, pulmonary with infarction   Urinary retention   S/P IVC filter    Azaryah Heathcock A 08/13/2014, 1:44 AM

## 2014-08-13 NOTE — Consult Note (Signed)
Patient well known to urology, has radiation cystitis and recently found to have ureteral stricture with subsequent stent ureteral stent placement.  Was discharged 1 week ago when he was hospitalized with gross hematuria.  IVC filter was placed and Xarelto was held.  Completed abx for empiric UTI coverage 4 days ago.  Was seen in ED this AM for hematuria and small catheter placed.  Returned again this evening with clot retention.  Per ED staff, 40F 3-way catheter was placed and bladder irrigated.  Catheter now draining. Plan to admit to hospitalist service.  Recommend continuous bladder irrigation - titrate drip rate to light pink.  Will plan to see patient early in AM.   I will also alert Dr. Diona Fanti to his readmission.

## 2014-08-13 NOTE — ED Notes (Addendum)
Report given to 4 th floor

## 2014-08-13 NOTE — Progress Notes (Signed)
Nutrition Brief Note  Patient identified on the Malnutrition Screening Tool (MST) Report  Wt Readings from Last 15 Encounters:  08/13/14 186 lb 15.9 oz (84.82 kg)  08/12/14 187 lb (84.823 kg)  07/26/14 187 lb 1.6 oz (84.868 kg)  07/26/14 187 lb 1.6 oz (84.868 kg)  07/14/14 191 lb 12.8 oz (87 kg)  10/25/13 197 lb 1.6 oz (89.404 kg)  01/24/13 188 lb 14.4 oz (85.684 kg)  08/07/12 177 lb 8 oz (80.513 kg)  07/24/12 177 lb 14.4 oz (80.695 kg)  02/11/12 182 lb 15.7 oz (83 kg)  12/14/11 178 lb 9.6 oz (81.012 kg)  09/06/11 174 lb 1.6 oz (78.971 kg)  07/19/11 178 lb 6 oz (80.91 kg)  02/09/11 172 lb (78.019 kg)  01/31/11 168 lb 9.6 oz (76.476 kg)    Body mass index is 30.2 kg/(m^2). Patient meets criteria for Obesit based on current BMI.   Current diet order is NPO. Labs and medications reviewed.   Pt reported hx of unintentional weight loss d/t prostate cancer; however has been able to regain back to his usual body weight. Typically consumes two meals daily (10am/3pm) and drinks Ensure once daily at nighttime. Will order as diet advancement tolerated.  No nutrition interventions warranted at this time. If nutrition issues arise, please consult RD.   Daniel Abide MS RD LDN Clinical Dietitian [phone]

## 2014-08-14 ENCOUNTER — Observation Stay (HOSPITAL_COMMUNITY): Payer: Medicare Other

## 2014-08-14 ENCOUNTER — Encounter (HOSPITAL_COMMUNITY): Payer: Self-pay | Admitting: Radiology

## 2014-08-14 DIAGNOSIS — F17220 Nicotine dependence, chewing tobacco, uncomplicated: Secondary | ICD-10-CM | POA: Diagnosis present

## 2014-08-14 DIAGNOSIS — I2510 Atherosclerotic heart disease of native coronary artery without angina pectoris: Secondary | ICD-10-CM | POA: Diagnosis present

## 2014-08-14 DIAGNOSIS — R310 Gross hematuria: Secondary | ICD-10-CM | POA: Diagnosis not present

## 2014-08-14 DIAGNOSIS — Z86711 Personal history of pulmonary embolism: Secondary | ICD-10-CM | POA: Diagnosis not present

## 2014-08-14 DIAGNOSIS — R319 Hematuria, unspecified: Secondary | ICD-10-CM | POA: Diagnosis not present

## 2014-08-14 DIAGNOSIS — N3040 Irradiation cystitis without hematuria: Secondary | ICD-10-CM | POA: Diagnosis not present

## 2014-08-14 DIAGNOSIS — D62 Acute posthemorrhagic anemia: Secondary | ICD-10-CM | POA: Diagnosis present

## 2014-08-14 DIAGNOSIS — E785 Hyperlipidemia, unspecified: Secondary | ICD-10-CM | POA: Diagnosis present

## 2014-08-14 DIAGNOSIS — C61 Malignant neoplasm of prostate: Secondary | ICD-10-CM | POA: Diagnosis present

## 2014-08-14 DIAGNOSIS — Z6830 Body mass index (BMI) 30.0-30.9, adult: Secondary | ICD-10-CM | POA: Diagnosis not present

## 2014-08-14 DIAGNOSIS — I10 Essential (primary) hypertension: Secondary | ICD-10-CM | POA: Diagnosis present

## 2014-08-14 DIAGNOSIS — Z8551 Personal history of malignant neoplasm of bladder: Secondary | ICD-10-CM | POA: Diagnosis not present

## 2014-08-14 DIAGNOSIS — Z85118 Personal history of other malignant neoplasm of bronchus and lung: Secondary | ICD-10-CM | POA: Diagnosis not present

## 2014-08-14 DIAGNOSIS — Z87442 Personal history of urinary calculi: Secondary | ICD-10-CM | POA: Diagnosis not present

## 2014-08-14 DIAGNOSIS — Z923 Personal history of irradiation: Secondary | ICD-10-CM | POA: Diagnosis not present

## 2014-08-14 DIAGNOSIS — D500 Iron deficiency anemia secondary to blood loss (chronic): Secondary | ICD-10-CM | POA: Diagnosis present

## 2014-08-14 DIAGNOSIS — R339 Retention of urine, unspecified: Secondary | ICD-10-CM | POA: Diagnosis not present

## 2014-08-14 DIAGNOSIS — N3289 Other specified disorders of bladder: Secondary | ICD-10-CM | POA: Diagnosis not present

## 2014-08-14 LAB — URINE CULTURE
CULTURE: NO GROWTH
Colony Count: NO GROWTH

## 2014-08-14 LAB — BASIC METABOLIC PANEL
Anion gap: 10 (ref 5–15)
BUN: 18 mg/dL (ref 6–23)
CHLORIDE: 103 meq/L (ref 96–112)
CO2: 25 mEq/L (ref 19–32)
CREATININE: 1.14 mg/dL (ref 0.50–1.35)
Calcium: 8.9 mg/dL (ref 8.4–10.5)
GFR calc non Af Amer: 62 mL/min — ABNORMAL LOW (ref >90)
GFR, EST AFRICAN AMERICAN: 72 mL/min — AB (ref >90)
GLUCOSE: 98 mg/dL (ref 70–99)
Potassium: 4.2 mEq/L (ref 3.7–5.3)
Sodium: 138 mEq/L (ref 137–147)

## 2014-08-14 LAB — CBC
HEMATOCRIT: 29.1 % — AB (ref 39.0–52.0)
HEMOGLOBIN: 9.1 g/dL — AB (ref 13.0–17.0)
MCH: 32.2 pg (ref 26.0–34.0)
MCHC: 31.3 g/dL (ref 30.0–36.0)
MCV: 102.8 fL — AB (ref 78.0–100.0)
Platelets: 164 10*3/uL (ref 150–400)
RBC: 2.83 MIL/uL — ABNORMAL LOW (ref 4.22–5.81)
RDW: 15.6 % — AB (ref 11.5–15.5)
WBC: 6.3 10*3/uL (ref 4.0–10.5)

## 2014-08-14 MED ORDER — BACITRACIN-NEOMYCIN-POLYMYXIN 400-5-5000 EX OINT
1.0000 "application " | TOPICAL_OINTMENT | Freq: Three times a day (TID) | CUTANEOUS | Status: DC | PRN
  Administered 2014-08-14: 1 via TOPICAL

## 2014-08-14 MED ORDER — ZOLPIDEM TARTRATE 5 MG PO TABS
5.0000 mg | ORAL_TABLET | Freq: Every evening | ORAL | Status: DC | PRN
  Administered 2014-08-14 – 2014-08-16 (×2): 5 mg via ORAL
  Filled 2014-08-14 (×2): qty 1

## 2014-08-14 MED ORDER — IOHEXOL 300 MG/ML  SOLN
125.0000 mL | Freq: Once | INTRAMUSCULAR | Status: AC | PRN
  Administered 2014-08-14: 125 mL via INTRAVENOUS

## 2014-08-14 MED ORDER — HYDROCODONE-ACETAMINOPHEN 5-325 MG PO TABS
1.0000 | ORAL_TABLET | ORAL | Status: DC | PRN
  Administered 2014-08-14 – 2014-08-16 (×7): 2 via ORAL
  Filled 2014-08-14 (×7): qty 2

## 2014-08-14 NOTE — Progress Notes (Signed)
Patient ID: Daniel Vega, male   DOB: April 09, 1941, 73 y.o.   MRN: [id number]  [facility] PROGRESS NOTE  Daniel Vega:[id number] DOB: 09-16-1941 DOA: 08/12/2014 PCP: Marjorie Smolder, MD  Brief narrative: 73 yo male recent h/o PE end of sept 2015 was placed on xaralto (prior to this he had some very mild hematuria), and within couple of weeks he developed gross hematuria and urinary retention. He was rehospitalized beginning of October at which time he had extensive w/u by urology including rt stent placed for right hydronephrosis. Was tx for uti and had ivc filter placed. He went home (has been OFF xaralto AND aspirin. His urine was clear but then in the last 2 days prior to this admission he started having some dark urine again, progressed to gross blood and he could not urinate. He denies any abd pain, no fevers. No n/v/d. No swelling in his legs. No sob or chest pain.  Assessment and Plan:    Principal Problem:   Hematuria with urinary retention  - likely source the lower urinary tract - improving somewhat with the carboprost irrigation - continue CBI and the carboprost - appreciate urology following  Active Problems:   Acute on chronic blood loss anemia, macrocytic  - secondary to hematuria noted above - Hg remains stable over the past 24 hours - repeat CBC in AM   CAD (coronary artery disease) - clinically stable    Prostate cancer - follows with urology team    Embolism, pulmonary with infarction - has IVC filter placed    HLD - continue statin   DVT prophylaxis  SCD's  Code Status: Full Family Communication: Pt and wife at bedside Disposition Plan: Home when medically stable  IV Access:   Peripheral IV Procedures and diagnostic studies:   CT Abd/Pelvis W Wo Contrast  08/14/2014   No explanation for hematuria identified. Right ureteral stent appears satisfactorily positioned. There is probable blood clot within the urinary bladder lumen. No obvious  ureteral lesion identified, although there is incomplete opacification of the right ureteral lumen. Small subcapsular fluid collection adjacent to the upper pole of the right kidney. Improved pleural effusions and bibasilar atelectasis.    Medical Consultants:   Urology  Other Consultants:   None   Anti-Infectives:   None   Faye Ramsay, MD  [facility] Pager [phone]  If 7PM-7AM, please contact night-coverage [email or web address] Password [email or web address] 08/14/2014, 2:23 PM   LOS: 2 days   HPI/Subjective: No events overnight.   Objective: Filed Vitals:   08/13/14 1357 08/13/14 2209 08/14/14 0509 08/14/14 1354  BP: 112/57 117/64 108/63 139/69  Pulse: 82  73 73  Temp: 98.1 F (36.7 C) 98.7 F (37.1 C) 98.4 F (36.9 C) 98.4 F (36.9 C)  TempSrc: Oral Oral Oral Oral  Resp: 18 18 20 18   Height:      Weight:      SpO2: 93% 96% 93% 98%    Intake/Output Summary (Last 24 hours) at 08/14/14 1423 Last data filed at 08/14/14 1220  Gross per 24 hour  Intake   1620 ml  Output   4575 ml  Net  -2955 ml    Exam:   General:  Pt is alert, follows commands appropriately, not in acute distress  Cardiovascular: Regular rate and rhythm, no rubs, no gallops  Respiratory: Clear to auscultation bilaterally, no wheezing, no crackles, no rhonchi  Abdomen: Soft, non tender, non distended, bowel sounds present, no guarding  Data Reviewed: Basic Metabolic Panel:  Recent  Labs Lab 08/12/14 0843 08/12/14 2333 08/13/14 0332 08/14/14 0350  NA 138 138 140 138  K 4.3 4.7 4.3 4.2  CL 103 102 105 103  CO2 22 21 23 25   GLUCOSE 138* 135* 119* 98  BUN 26* 27* 25* 18  CREATININE 1.27 1.30 1.21 1.14  CALCIUM 9.1 8.6 8.6 8.9   CBC:  Recent Labs Lab 08/12/14 0843 08/12/14 2333 08/13/14 0332 08/14/14 0350  WBC 6.9 9.1 7.3 6.3  NEUTROABS 5.0 6.6  --   --   HGB 10.5* 10.2* 9.1* 9.1*  HCT 32.3* 30.9* 27.7* 29.1*  MCV 102.2* 103.3* 101.8* 102.8*  PLT 181 171 164 164   URINE CULTURE     Status:  None    08/13/14 12:16 AM   Value: NO GROWTH    Scheduled Meds: . atorvastatin  40 mg Oral q morning - 16I  . folic acid  1 mg Oral Daily  . tamsulosin  0.4 mg Oral Daily  . verapamil  240 mg Oral BID

## 2014-08-14 NOTE — Progress Notes (Signed)
  Subjective: Patient reports some bladder spasms since   the [place] process was started. Objective: Vital signs in last 24 hours: Temp:  [98.1 F (36.7 C)-98.7 F (37.1 C)] 98.4 F (36.9 C) (10/29 0509) Pulse Rate:  [73-82] 73 (10/29 0509) Resp:  [18-20] 20 (10/29 0509) BP: (108-117)/(57-64) 108/63 mmHg (10/29 0509) SpO2:  [93 %-96 %] 93 % (10/29 0509)  Intake/Output from previous day: 10/28 0701 - 10/29 0700 In: 1300  Out: 5800 [Urine:5800] Intake/Output this shift:    Physical Exam:  Constitutional: Vital signs reviewed. WD WN in NAD   Eyes: PERRL, No scleral icterus.   Pulmonary/Chest: Normal effort   Lab Results:  Recent Labs  08/12/14 2333 08/13/14 0332 08/14/14 0350  HGB 10.2* 9.1* 9.1*  HCT 30.9* 27.7* 29.1*   BMET  Recent Labs  08/13/14 0332 08/14/14 0350  NA 140 138  K 4.3 4.2  CL 105 103  CO2 23 25  GLUCOSE 119* 98  BUN 25* 18  CREATININE 1.21 1.14  CALCIUM 8.6 8.9    Recent Labs  08/12/14 2333  INR 1.08   No results found for this basename: LABURIN,  in the last 72 hours Results for orders placed during the hospital encounter of 08/12/14  URINE CULTURE     Status: None   Collection Time    08/13/14 12:16 AM      Result Value Ref Range Status   Specimen Description URINE, RANDOM   Final   Special Requests NONE   Final   Culture  Setup Time     Final   Value: 08/13/2014 04:33     Performed at [place]     Final   Value: NO GROWTH     Performed at [facility]   Culture     Final   Value: NO GROWTH     Performed at [facility]   Report Status 08/14/2014 FINAL   Final    Studies/Results: No results found.  Assessment/Plan:   Gross hematuria, likely source the lower urinary tract. This is improving somewhat with the carboprost irrigation. CT is pending.    I will continue CBI and the carboprost. Hopefully, we can avoid a second intervention for the hematuria.   LOS: 2 days    Franchot Gallo M 08/14/2014, 8:46 AM

## 2014-08-14 NOTE — Progress Notes (Signed)
[facility]  Patient Status: Active (receiving services up to time of hospitalization)  [facility] is providing the following services: PT  If patient discharges after hours, please call [phone].   Lurlean Leyden 08/14/2014, 4:24 PM

## 2014-08-14 NOTE — Progress Notes (Signed)
UR completed 

## 2014-08-15 LAB — BASIC METABOLIC PANEL
Anion gap: 11 (ref 5–15)
BUN: 16 mg/dL (ref 6–23)
CALCIUM: 9.2 mg/dL (ref 8.4–10.5)
CO2: 26 mEq/L (ref 19–32)
CREATININE: 1.1 mg/dL (ref 0.50–1.35)
Chloride: 103 mEq/L (ref 96–112)
GFR calc Af Amer: 75 mL/min — ABNORMAL LOW (ref >90)
GFR calc non Af Amer: 65 mL/min — ABNORMAL LOW (ref >90)
GLUCOSE: 108 mg/dL — AB (ref 70–99)
Potassium: 4.4 mEq/L (ref 3.7–5.3)
SODIUM: 140 meq/L (ref 137–147)

## 2014-08-15 LAB — CBC
HCT: 30 % — ABNORMAL LOW (ref 39.0–52.0)
HEMOGLOBIN: 9.8 g/dL — AB (ref 13.0–17.0)
MCH: 33.3 pg (ref 26.0–34.0)
MCHC: 32.7 g/dL (ref 30.0–36.0)
MCV: 102 fL — ABNORMAL HIGH (ref 78.0–100.0)
Platelets: 184 10*3/uL (ref 150–400)
RBC: 2.94 MIL/uL — AB (ref 4.22–5.81)
RDW: 15.6 % — ABNORMAL HIGH (ref 11.5–15.5)
WBC: 5.2 10*3/uL (ref 4.0–10.5)

## 2014-08-15 NOTE — Progress Notes (Signed)
Patient ID: Daniel Vega, male   DOB: Mar 08, 1941, 73 y.o.   MRN: [id number]  [facility] PROGRESS NOTE  Daniel Vega OJJ:[id number] DOB: [birth year] DOA: 08/12/2014 PCP: Marjorie Smolder, MD  Brief narrative:  73 yo male recent h/o PE end of sept 2015 was placed on xaralto (prior to this he had some very mild hematuria), and within couple of weeks he developed gross hematuria and urinary retention. He was rehospitalized beginning of October at which time he had extensive w/u by urology including rt stent placed for right hydronephrosis. Was tx for uti and had ivc filter placed. He went home (has been OFF xaralto AND aspirin. His urine was clear but then in the last 2 days prior to this admission he started having some dark urine again, progressed to gross blood and he could not urinate. He denies any abd pain, no fevers. No n/v/d. No swelling in his legs. No sob or chest pain.   Assessment and Plan:   Principal Problem:  Hematuria with urinary retention  - likely source the lower urinary tract  - improving with the carboprost irrigation  - continue CBI and the carboprost  - appreciate urology following  Active Problems:  Acute on chronic blood loss anemia, macrocytic  - secondary to hematuria noted above  - Hg remains stable over the past 24 hours  - repeat CBC in AM  CAD (coronary artery disease)  - clinically stable  Prostate cancer  - follows with urology team  Embolism, pulmonary with infarction  - has IVC filter placed  HLD  - continue statin   DVT prophylaxis  SCD's  Code Status: Full  Family Communication: Pt and wife at bedside  Disposition Plan: Home when medically stable   IV Access:   Peripheral IV Procedures and diagnostic studies:   CT Abd/Pelvis W Wo Contrast 08/14/2014 No explanation for hematuria identified. Right ureteral stent appears satisfactorily positioned. There is probable blood clot within the urinary bladder lumen. No obvious ureteral lesion  identified, although there is incomplete opacification of the right ureteral lumen. Small subcapsular fluid collection adjacent to the upper pole of the right kidney. Improved pleural effusions and bibasilar atelectasis.  Medical Consultants:   Urology  Other Consultants:   None  Anti-Infectives:   None   Faye Ramsay, MD  [facility] Pager [phone]  If 7PM-7AM, please contact night-coverage [email or web address] Password [email or web address] 08/15/2014, 12:53 PM   LOS: 3 days   HPI/Subjective: No events overnight.   Objective: Filed Vitals:   08/14/14 1354 08/14/14 2158 08/15/14 0507 08/15/14 1020  BP: 139/69 104/64 104/55 106/56  Pulse: 73 80 71 68  Temp: 98.4 F (36.9 C) 98.2 F (36.8 C) 98.1 F (36.7 C) 98 F (36.7 C)  TempSrc: Oral Oral Oral Oral  Resp: 18 18 18 18   Height:      Weight:      SpO2: 98% 93% 94% 94%    Intake/Output Summary (Last 24 hours) at 08/15/14 1253 Last data filed at 08/15/14 0900  Gross per 24 hour  Intake   2840 ml  Output   2975 ml  Net   -135 ml    Exam:   General:  Pt is alert, follows commands appropriately, not in acute distress  Cardiovascular: Regular rate and rhythm, no rubs, no gallops  Respiratory: Clear to auscultation bilaterally, no wheezing, no crackles, no rhonchi  Abdomen: Soft, non tender, non distended, bowel sounds present, no guarding   Data Reviewed: Basic Metabolic Panel:  Recent Labs  Lab 08/12/14 0843 08/12/14 2333 08/13/14 0332 08/14/14 0350 08/15/14 0444  NA 138 138 140 138 140  K 4.3 4.7 4.3 4.2 4.4  CL 103 102 105 103 103  CO2 22 21 23 25 26   GLUCOSE 138* 135* 119* 98 108*  BUN 26* 27* 25* 18 16  CREATININE 1.27 1.30 1.21 1.14 1.10  CALCIUM 9.1 8.6 8.6 8.9 9.2   CBC:  Recent Labs Lab 08/12/14 0843 08/12/14 2333 08/13/14 0332 08/14/14 0350 08/15/14 0444  WBC 6.9 9.1 7.3 6.3 5.2  NEUTROABS 5.0 6.6  --   --   --   HGB 10.5* 10.2* 9.1* 9.1* 9.8*  HCT 32.3* 30.9* 27.7* 29.1* 30.0*  MCV 102.2* 103.3*  101.8* 102.8* 102.0*  PLT 181 171 164 164 184   Recent Results (from the past 240 hour(s))  URINE CULTURE     Status: None   Collection Time    08/13/14 12:16 AM      Result Value Ref Range Status   Specimen Description URINE, RANDOM   Final   Special Requests NONE   Final   Culture  Setup Time     Final   Value: 08/13/2014 04:33     Performed at [place]     Final   Value: NO GROWTH     Performed at [facility]   Culture     Final   Value: NO GROWTH     Performed at [facility]   Report Status 08/14/2014 FINAL   Final    Scheduled Meds: . atorvastatin  40 mg Oral q morning - 10a  . carboprost (HEMABATE) bladder irrigation - continuous   Bladder Irrigation Q24H   And  . sodium chloride irrigation  3,000 mL Bladder Irrigation Q24H  . feeding supplement (ENSURE COMPLETE)  237 mL Oral Daily  . folic acid  1 mg Oral Daily  . sodium chloride  3 mL Intravenous Q12H  . tamsulosin  0.4 mg Oral Daily  . verapamil  240 mg Oral BID   Continuous Infusions:

## 2014-08-15 NOTE — Progress Notes (Signed)
Subjective: Patient reports that he is having less pain.  Still some spasms when the carboprost runs  Objective: Vital signs in last 24 hours: Temp:  [98.1 F (36.7 C)-98.4 F (36.9 C)] 98.1 F (36.7 C) (10/30 0507) Pulse Rate:  [71-80] 71 (10/30 0507) Resp:  [18] 18 (10/30 0507) BP: (104-139)/(55-69) 104/55 mmHg (10/30 0507) SpO2:  [93 %-98 %] 94 % (10/30 0507)  Intake/Output from previous day: 10/29 0701 - 10/30 0700 In: 2920 [P.O.:840] Out: 4825 [Urine:4825] Intake/Output this shift:    Physical Exam:  Constitutional: Vital signs reviewed. WD WN in NAD    His urine is much clearer, now running with just saline.  Lab Results:  Recent Labs  08/13/14 0332 08/14/14 0350 08/15/14 0444  HGB 9.1* 9.1* 9.8*  HCT 27.7* 29.1* 30.0*   BMET  Recent Labs  08/14/14 0350 08/15/14 0444  NA 138 140  K 4.2 4.4  CL 103 103  CO2 25 26  GLUCOSE 98 108*  BUN 18 16  CREATININE 1.14 1.10  CALCIUM 8.9 9.2    Recent Labs  08/12/14 2333  INR 1.08   No results found for this basename: LABURIN,  in the last 72 hours Results for orders placed during the hospital encounter of 08/12/14  URINE CULTURE     Status: None   Collection Time    08/13/14 12:16 AM      Result Value Ref Range Status   Specimen Description URINE, RANDOM   Final   Special Requests NONE   Final   Culture  Setup Time     Final   Value: 08/13/2014 04:33     Performed at [place]     Final   Value: NO GROWTH     Performed at [facility]   Culture     Final   Value: NO GROWTH     Performed at [facility]   Report Status 08/14/2014 FINAL   Final    Studies/Results: Ct Abdomen Pelvis W Wo Contrast  08/14/2014   CLINICAL DATA:  Recurrent hematuria. History of long and prostate cancer with right ureteral stenting. Subsequent encounter.  EXAM: CT ABDOMEN AND PELVIS WITHOUT AND WITH CONTRAST  TECHNIQUE: Multidetector CT imaging of the abdomen and pelvis was  performed following the standard protocol before and following the bolus administration of intravenous contrast.  CONTRAST:  128mL OMNIPAQUE IOHEXOL 300 MG/ML  SOLN  COMPARISON:  Prior CT 07/30/2014 and 07/26/2014.  FINDINGS: Lower chest: Interval improvement in the right-greater-than-left pleural effusions following thoracentesis. There is improved aeration of both lung bases with residual right lower lobe atelectasis. No pneumothorax.  Hepatobiliary: The liver is normal in density without suspicious focal lesion. There are probable tiny hepatic cysts. No evidence of gallstones, gallbladder wall thickening or biliary dilatation.  Pancreas: Unremarkable. No pancreatic ductal dilatation or surrounding inflammatory changes.  Spleen: Normal in size without focal abnormality.  Adrenals/Urinary Tract: Both adrenal glands appear normal.Interval double-J right ureteral stent placement. The stent appears well position. No renal, ureteral or bladder calculi are demonstrated on the precontrast images. Postcontrast images demonstrate a small subcapsular fluid collection adjacent to the upper pole of the right kidney. This demonstrates no enhancement. There is a small cyst in the interpolar region of the left kidney. There is no evidence of enhancing renal mass. No focal urothelial lesions are identified. The distal right ureter is incompletely opacified with contrast, although no residual collecting system dilatation identified. There are filling defects  within the urinary bladder which are most consistent with blood clot. Foley catheter is in place, and a small amount of air is present within the bladder lumen. There is mild bladder wall thickening.  Stomach/Bowel: No evidence of bowel wall thickening, distention or surrounding inflammatory change.No ascites or focal extraluminal fluid collection.  Vascular/Lymphatic: There are no enlarged abdominal or pelvic lymph nodes. Atherosclerosis of the aorta, its branches and the  iliac arteries is again noted. IVC filter is in place.  Reproductive: There is stable prostate brachytherapy seeds. The seminal vesicles are atrophied without focal abnormality.  Other: No evidence of abdominal wall mass or hernia.  Musculoskeletal: No acute or significant osseous findings. There are stable asymmetric degenerative changes in the right hip and a stable peripherally sclerotic lesion in the right iliac bone.  IMPRESSION: 1. No explanation for hematuria identified. Right ureteral stent appears satisfactorily positioned. There is probable blood clot within the urinary bladder lumen. No obvious ureteral lesion identified, although there is incomplete opacification of the right ureteral lumen. 2. Small subcapsular fluid collection adjacent to the upper pole of the right kidney. 3. Improved pleural effusions and bibasilar atelectasis.   Electronically Signed   By: Camie Patience M.D.   On: 08/14/2014 09:08   CT scan was reviewed.  He has no obvious renal pelvic lesion anymore. Assessment/Plan:     Gross hematuria, likely from lower urinary tract source,improved with prostaglandin irrigation.  I will stop his CBI today.  We will remove his catheter 2 more morning for voiding trial and possible lefthim go home.  If his situation recurs,would consider hyperbaric oxygen therapy.   LOS: 3 days   Franchot Gallo M 08/15/2014, 8:06 AM

## 2014-08-15 NOTE — Progress Notes (Signed)
Patient blood pressure 106/56 verapamil due, Dr Doyle Askew notified and stated its okay to hold Verapamil. Will continue to assess patient.

## 2014-08-16 MED ORDER — DOCUSATE SODIUM 100 MG PO CAPS
100.0000 mg | ORAL_CAPSULE | Freq: Two times a day (BID) | ORAL | Status: DC
  Administered 2014-08-16 – 2014-08-17 (×3): 100 mg via ORAL
  Filled 2014-08-16 (×3): qty 1

## 2014-08-16 NOTE — Progress Notes (Addendum)
Patient ID: Daniel Vega, male   DOB: [birth year], 73 y.o.   MRN: [id number]  [facility] PROGRESS NOTE  JHONATAN LOMELI JOI:[id number] DOB: 05-May-1941 DOA: 08/12/2014 PCP: Marjorie Smolder, MD  Brief narrative:  73 yo male recent h/o PE end of sept 2015 was placed on xaralto (prior to this he had some very mild hematuria), and within couple of weeks he developed gross hematuria and urinary retention. He was rehospitalized beginning of October at which time he had extensive w/u by urology including rt stent placed for right hydronephrosis. Was tx for uti and had ivc filter placed. He went home (has been OFF xaralto AND aspirin. His urine was clear but then in the last 2 days prior to this admission he started having some dark urine again, progressed to gross blood and he could not urinate. He denies any abd pain, no fevers. No n/v/d. No swelling in his legs. No sob or chest pain.   Assessment and Plan:   Principal Problem:  Hematuria with urinary retention  - likely source the lower urinary tract  - improving with the carboprost irrigation  - cath out and monitoring urine output prior to discharge  - appreciate urology following  - pt wants to go home in AM Active Problems:  Acute on chronic blood loss anemia, macrocytic  - secondary to hematuria noted above  - Hg remains stable over the past 24 hours  - repeat CBC in AM  CAD (coronary artery disease)  - clinically stable  Prostate cancer  - follows with urology team  Embolism, pulmonary with infarction  - has IVC filter placed  HLD  - continue statin   DVT prophylaxis  SCD's  Code Status: Full  Family Communication: Pt and wife at bedside  Disposition Plan: Home when medically stable   IV Access:   Peripheral IV Procedures and diagnostic studies:   CT Abd/Pelvis W Wo Contrast 08/14/2014 No explanation for hematuria identified. Right ureteral stent appears satisfactorily positioned. There is probable blood clot within  the urinary bladder lumen. No obvious ureteral lesion identified, although there is incomplete opacification of the right ureteral lumen. Small subcapsular fluid collection adjacent to the upper pole of the right kidney. Improved pleural effusions and bibasilar atelectasis.  Medical Consultants:   Urology  Other Consultants:   None  Anti-Infectives:   None   Faye Ramsay, MD  [facility] Pager [phone]  If 7PM-7AM, please contact night-coverage [email or web address] Password [email or web address] 08/16/2014, 11:49 AM   LOS: 4 days   HPI/Subjective: No events overnight.   Objective: Filed Vitals:   08/15/14 1020 08/15/14 1315 08/15/14 2101 08/16/14 0513  BP: 106/56 110/56 123/72 121/66  Pulse: 68 73 80 85  Temp: 98 F (36.7 C) 98 F (36.7 C) 98 F (36.7 C) 98.2 F (36.8 C)  TempSrc: Oral Oral Oral Oral  Resp: 18 18 18 18   Height:      Weight:      SpO2: 94% 93% 97% 96%    Intake/Output Summary (Last 24 hours) at 08/16/14 1149 Last data filed at 08/16/14 0948  Gross per 24 hour  Intake    480 ml  Output   2040 ml  Net  -1560 ml    Exam:   General:  Pt is alert, follows commands appropriately, not in acute distress  Cardiovascular: Regular rate and rhythm, no rubs, no gallops  Respiratory: Clear to auscultation bilaterally, no wheezing, no crackles, no rhonchi  Abdomen: Soft, non tender, non distended, bowel sounds present,  no guarding  Extremities: No edema, pulses DP and PT palpable bilaterally  Neuro: Grossly nonfocal  Data Reviewed: Basic Metabolic Panel:  Recent Labs Lab 08/12/14 0843 08/12/14 2333 08/13/14 0332 08/14/14 0350 08/15/14 0444  NA 138 138 140 138 140  K 4.3 4.7 4.3 4.2 4.4  CL 103 102 105 103 103  CO2 22 21 23 25 26   GLUCOSE 138* 135* 119* 98 108*  BUN 26* 27* 25* 18 16  CREATININE 1.27 1.30 1.21 1.14 1.10  CALCIUM 9.1 8.6 8.6 8.9 9.2   CBC:  Recent Labs Lab 08/12/14 0843 08/12/14 2333 08/13/14 0332 08/14/14 0350 08/15/14 0444  WBC 6.9  9.1 7.3 6.3 5.2  NEUTROABS 5.0 6.6  --   --   --   HGB 10.5* 10.2* 9.1* 9.1* 9.8*  HCT 32.3* 30.9* 27.7* 29.1* 30.0*  MCV 102.2* 103.3* 101.8* 102.8* 102.0*  PLT 181 171 164 164 184    Recent Results (from the past 240 hour(s))  URINE CULTURE     Status: None   Collection Time    08/13/14 12:16 AM      Result Value Ref Range Status   Specimen Description URINE, RANDOM   Final   Special Requests NONE   Final   Culture  Setup Time     Final   Value: 08/13/2014 04:33     Performed at [place]     Final   Value: NO GROWTH     Performed at [facility]   Culture     Final   Value: NO GROWTH     Performed at [facility]   Report Status 08/14/2014 FINAL   Final    Scheduled Meds: . atorvastatin  40 mg Oral q morning - 10a  . feeding supplement (ENSURE COMPLETE)  237 mL Oral Daily  . folic acid  1 mg Oral Daily  . sodium chloride  3 mL Intravenous Q12H  . tamsulosin  0.4 mg Oral Daily  . verapamil  240 mg Oral BID   Continuous Infusions:

## 2014-08-16 NOTE — Progress Notes (Signed)
Patient ID: Daniel Vega, male   DOB: 1941/07/15, 73 y.o.   MRN: [id number]   Subjective: Patient is without complaint. Specifically denies any nausea or vomiting. His urine remained clear over the past 24 hours and his Foley catheter has been removed.  Objective: Vital signs in last 24 hours: Temp:  [98 F (36.7 C)-98.2 F (36.8 C)] 98.2 F (36.8 C) (10/31 0513) Pulse Rate:  [68-85] 85 (10/31 0513) Resp:  [18] 18 (10/31 0513) BP: (106-123)/(56-72) 121/66 mmHg (10/31 0513) SpO2:  [93 %-97 %] 96 % (10/31 0513)  Intake/Output from previous day: 10/30 0701 - 10/31 0700 In: 480 [P.O.:480] Out: 1995 [Urine:1995] Intake/Output this shift:    Past Medical History  Diagnosis Date  . Hypertension   . Hypercholesteremia   . Glucose intolerance (impaired glucose tolerance)   . Kidney stones, calcium oxalate   . CAD (coronary artery disease)   . 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 Adenomatous colon polyp     Dr. Amedeo Plenty  . Prostate cancer 2009    Dr. Eulogio Ditch; tx w/ radium implants  . Lung cancer    Current Facility-Administered Medications  Medication Dose Route Frequency Provider Last Rate Last Dose  . 0.9 %  sodium chloride infusion  250 mL Intravenous PRN Phillips Grout, MD      . acetaminophen (TYLENOL) tablet 500 mg  500 mg Oral Q6H PRN Phillips Grout, MD   500 mg at 08/14/14 0528  . ALPRAZolam Duanne Moron) tablet 0.5 mg  0.5 mg Oral TID PRN Phillips Grout, MD   0.5 mg at 08/14/14 2216  . atorvastatin (LIPITOR) tablet 40 mg  40 mg Oral q morning - 10a Phillips Grout, MD   40 mg at 08/15/14 1711  . feeding supplement (ENSURE COMPLETE) (ENSURE COMPLETE) liquid 237 mL  237 mL Oral Daily Phillips Grout, MD   237 mL at 27/74/12 8786  . folic acid (FOLVITE) tablet 1 mg  1 mg Oral Daily Phillips Grout, MD   1 mg at 08/15/14 1014  . HYDROcodone-acetaminophen (NORCO/VICODIN) 5-325 MG per tablet 1-2 tablet  1-2 tablet Oral Q4H PRN [name], MD   2 tablet at 08/16/14  0518  . neomycin-bacitracin-polymyxin (NEOSPORIN) ointment 1 application  1 application Topical TID PRN Theodis Blaze, MD   1 application at 76/72/09 0300  . ondansetron (ZOFRAN) injection 4 mg  4 mg Intravenous Q6H PRN Theodis Blaze, MD      . oxybutynin [facility]) tablet 5 mg  5 mg Oral Q8H PRN Phillips Grout, MD   5 mg at 08/15/14 0506  . sodium chloride 0.9 % injection 3 mL  3 mL Intravenous Q12H Phillips Grout, MD   3 mL at 08/15/14 2232  . sodium chloride 0.9 % injection 3 mL  3 mL Intravenous PRN Phillips Grout, MD      . tamsulosin (FLOMAX) capsule 0.4 mg  0.4 mg Oral Daily Phillips Grout, MD   0.4 mg at 08/15/14 1014  . verapamil (CALAN-SR) CR tablet 240 mg  240 mg Oral BID Phillips Grout, MD   240 mg at 08/15/14 2145  . zolpidem (AMBIEN) tablet 5 mg  5 mg Oral QHS PRN Theodis Blaze, MD   5 mg at 08/14/14 0300    Physical Exam:  General: Patient is in no apparent distress Lungs: Normal respiratory effort, chest expands symmetrically. GI: The abdomen is soft and nontender without mass.  Lab Results:  Recent Labs  08/14/14 0350 08/15/14 0444  WBC 6.3 5.2  HGB 9.1* 9.8*  HCT 29.1* 30.0*   BMET  Recent Labs  08/14/14 0350 08/15/14 0444  NA 138 140  K 4.2 4.4  CL 103 103  CO2 25 26  GLUCOSE 98 108*  BUN 18 16  CREATININE 1.14 1.10  CALCIUM 8.9 9.2   No results found for this basename: LABPT, INR,  in the last 72 hours No results found for this basename: LABURIN,  in the last 72 hours Results for orders placed during the hospital encounter of 08/12/14  URINE CULTURE     Status: None   Collection Time    08/13/14 12:16 AM      Result Value Ref Range Status   Specimen Description URINE, RANDOM   Final   Special Requests NONE   Final   Culture  Setup Time     Final   Value: 08/13/2014 04:33     Performed at [place]     Final   Value: NO GROWTH     Performed at [facility]   Culture     Final   Value: NO GROWTH      Performed at [facility]   Report Status 08/14/2014 FINAL   Final    Studies/Results: No results found.  Assessment/Plan: At this point it appears his urine has cleared and remained clear. His catheter is now out. He could potentially be discharged from urologic standpoint at this time. He will follow-up with Dr. Diona Fanti as an outpatient.  Alonna Bartling C 08/16/2014, 7:39 AM

## 2014-08-17 MED ORDER — HYDROCODONE-ACETAMINOPHEN 5-325 MG PO TABS: 1.0000 | ORAL_TABLET | ORAL | Status: DC | PRN

## 2014-08-17 MED ORDER — BISACODYL 10 MG RE SUPP
10.0000 mg | Freq: Once | RECTAL | Status: AC
  Administered 2014-08-17: 10 mg via RECTAL
  Filled 2014-08-17: qty 1

## 2014-08-17 MED ORDER — ZOLPIDEM TARTRATE 5 MG PO TABS: 5.0000 mg | ORAL_TABLET | Freq: Every evening | ORAL | Status: DC | PRN

## 2014-08-17 NOTE — Progress Notes (Signed)
CARE MANAGEMENT NOTE 08/17/2014  Patient:  CIPRIANO, MILLIKAN   Account Number:  [id number]  Date Initiated:  08/13/2014  Documentation initiated by:  [facility]  Subjective/Objective Assessment:   73 Y/O M ADMITTED W/HEMATURIA.     Action/Plan:   FROM HOME W/SPOUSE.HAS CANE, RW,3N1.ACTIVE W/HAC-HHPT.   Anticipated DC Date:  08/14/2014   Anticipated DC Plan:  [place]  CM consult      [facility] Choice  Resumption Of Svcs/PTA Provider   Choice offered to / List presented to:  C-1 Patient        [place] arranged  [place] PT      [place].   Status of service:  Completed, signed off Medicare Important Message given?  YES (If response is "NO", the following Medicare IM given date fields will be blank) Date Medicare IM given:  08/17/2014 Medicare IM given by:  [facility] Date Additional Medicare IM given:   Additional Medicare IM given by:    Discharge Disposition:  [place]  Per UR Regulation:  Reviewed for med. necessity/level of care/duration of stay  If discussed at [place] of Stay Meetings, dates discussed:    Comments:  08/17/2014 1245 [place] aware of dc home today with HH. Jonnie Finner RN CCM Case Mgmt phone [phone]  08/13/14 KATHY MAHABIR RN,BSN NCM [place] W/HHPT.AWAIT FINAL HHC ORDER, & FACE TO FACE.

## 2014-08-17 NOTE — Plan of Care (Signed)
Problem: Phase I Progression Outcomes Goal: OOB as tolerated unless otherwise ordered Outcome: Completed/Met Date Met:  08/17/14  Problem: Phase II Progression Outcomes Goal: Progress activity as tolerated unless otherwise ordered Outcome: Completed/Met Date Met:  08/17/14 Goal: Discharge plan established Outcome: Completed/Met Date Met:  08/17/14

## 2014-08-17 NOTE — Discharge Summary (Signed)
Physician Discharge Summary  Daniel Vega YHC:[id number] DOB: 21-Apr-1941 DOA: 08/12/2014  PCP: Marjorie Smolder, MD  Admit date: 08/12/2014 Discharge date: 08/17/2014  Recommendations for Outpatient Follow-up:  1. Pt will need to follow up with PCP in 2-3 weeks post discharge 2. Please obtain BMP to evaluate electrolytes and kidney function 3. Please also check CBC to evaluate Hg and Hct levels  Discharge Diagnoses:  Principal Problem:   Hematuria Active Problems:   CAD (coronary artery disease)   Prostate cancer   Embolism, pulmonary with infarction   Urinary retention   S/P IVC filter  Discharge Condition: Stable  Diet recommendation: Heart healthy diet discussed in details   Brief narrative:  73 yo male recent h/o PE end of sept 2015 was placed on xaralto (prior to this he had some very mild hematuria), and within couple of weeks he developed gross hematuria and urinary retention. He was rehospitalized beginning of October at which time he had extensive w/u by urology including rt stent placed for right hydronephrosis. Was tx for uti and had ivc filter placed. He went home (has been OFF xaralto AND aspirin. His urine was clear but then in the last 2 days prior to this admission he started having some dark urine again, progressed to gross blood and he could not urinate. He denies any abd pain, no fevers. No n/v/d. No swelling in his legs. No sob or chest pain.   Assessment and Plan:   Principal Problem:  Hematuria with urinary retention  - likely source the lower urinary tract  - improving with the carboprost irrigation  - cath out and monitoring urine output prior to discharge, no difficulties overnight  - appreciate urology following  - pt stable for d/c home  Active Problems:  Acute on chronic blood loss anemia, macrocytic  - secondary to hematuria noted above  - Hg remains stable over the past 24 hours  CAD (coronary artery disease)  - clinically stable   Prostate cancer  - follows with urology team  Embolism, pulmonary with infarction  - has IVC filter placed  HLD  - continue statin   Code Status: Full  Family Communication: Pt and wife at bedside  Disposition Plan: Home   IV Access:    Peripheral IV Procedures and diagnostic studies:    CT Abd/Pelvis W Wo Contrast 08/14/2014 No explanation for hematuria identified. Right ureteral stent appears satisfactorily positioned. There is probable blood clot within the urinary bladder lumen. No obvious ureteral lesion identified, although there is incomplete opacification of the right ureteral lumen. Small subcapsular fluid collection adjacent to the upper pole of the right kidney. Improved pleural effusions and bibasilar atelectasis.  Medical Consultants:    Urology   Discharge Exam: Filed Vitals:   08/17/14 0553  BP: 125/61  Pulse: 73  Temp: 98.1 F (36.7 C)  Resp: 18   Filed Vitals:   08/16/14 0513 08/16/14 1403 08/16/14 2131 08/17/14 0553  BP: 121/66 130/64 127/61 125/61  Pulse: 85 62 83 73  Temp: 98.2 F (36.8 C) 98 F (36.7 C) 98.6 F (37 C) 98.1 F (36.7 C)  TempSrc: Oral Oral Oral Oral  Resp: 18 20 18 18   Height:      Weight:      SpO2: 96% 97% 96% 96%    General: Pt is alert, follows commands appropriately, not in acute distress Cardiovascular: Regular rate and rhythm, S1/S2 +, no murmurs, no rubs, no gallops Respiratory: Clear to auscultation bilaterally, no wheezing, no crackles, no  rhonchi Abdominal: Soft, non tender, non distended, bowel sounds +, no guarding Extremities: no edema, no cyanosis, pulses palpable bilaterally DP and PT Neuro: Grossly nonfocal  Discharge Instructions  Discharge Instructions    Diet - low sodium heart healthy    Complete by:  As directed      Increase activity slowly    Complete by:  As directed             Medication List    STOP taking these medications        oxybutynin 5 MG tablet  Commonly known  as:  DITROPAN      TAKE these medications        acetaminophen 500 MG tablet  Commonly known as:  TYLENOL  Take 500 mg by mouth every 6 (six) hours as needed for moderate pain (pain).     allopurinol 100 MG tablet  Commonly known as:  ZYLOPRIM  Take 100 mg by mouth Daily.     ALPRAZolam 0.5 MG tablet  Commonly known as:  XANAX  Take 0.5 mg by mouth 3 (three) times daily as needed for anxiety (anxiety).     atorvastatin 40 MG tablet  Commonly known as:  LIPITOR  Take 40 mg by mouth every morning.     cephALEXin 500 MG capsule  Commonly known as:  KEFLEX  Take 1 capsule (500 mg total) by mouth every 12 (twelve) hours.     feeding supplement (ENSURE COMPLETE) Liqd  Take 237 mLs by mouth daily.     folic acid 1 MG tablet  Commonly known as:  FOLVITE  Take 1 mg by mouth daily.     glucosamine-chondroitin 500-400 MG tablet  Take 1 tablet by mouth 2 (two) times daily.     HYDROcodone-acetaminophen 5-325 MG per tablet  Commonly known as:  NORCO/VICODIN  Take 1-2 tablets by mouth every 4 (four) hours as needed for moderate pain.     nitroGLYCERIN 0.4 MG SL tablet  Commonly known as:  NITROSTAT  Place 0.4 mg under the tongue every 5 (five) minutes as needed. For chest pain     tamsulosin 0.4 MG Caps capsule  Commonly known as:  FLOMAX  Take 1 capsule (0.4 mg total) by mouth daily.     traMADol 50 MG tablet  Commonly known as:  ULTRAM  Take 1 tablet (50 mg total) by mouth every 6 (six) hours as needed for moderate pain (if response to Tylenol insufficient).     verapamil 240 MG CR tablet  Commonly known as:  CALAN-SR  Take 240 mg by mouth 2 (two) times daily.     VITAMIN B 12 PO  Take 1,000 mcg by mouth daily.     zolpidem 5 MG tablet  Commonly known as:  AMBIEN  Take 1 tablet (5 mg total) by mouth at bedtime as needed for sleep.           Follow-up Information    Follow up with DAHLSTEDT, Lillette Boxer, MD.   Specialty:  Urology   Why:  Call the office to  schedule follow-up.   Contact information:   [place] [address] [phone]       Follow up with Marjorie Smolder, MD.   Specialty:  Family Medicine   Contact information:   [place] [address] [place] [address] [phone]       Follow up with Faye Ramsay, MD.   Specialty:  Internal Medicine   Why:  As needed, If symptoms worsen   Contact information:   [address] [place] [address] [phone]        The results of significant diagnostics from this hospitalization (including imaging, microbiology, ancillary and laboratory) are listed below for reference.     Microbiology: Recent Results (from the past 240 hour(s))  Urine culture     Status: None   Collection Time: 08/13/14 12:16 AM  Result Value Ref Range Status   Specimen Description URINE, RANDOM  Final   Special Requests NONE  Final   Culture  Setup Time   Final    08/13/2014 04:33 Performed at [place] Performed at [facility]  Final   Culture NO GROWTH Performed at [facility]  Final   Report Status 08/14/2014 FINAL  Final     Labs: Basic Metabolic Panel:  Recent Labs Lab 08/12/14 0843 08/12/14 2333 08/13/14 0332 08/14/14 0350 08/15/14 0444  NA 138 138 140 138 140  K 4.3 4.7 4.3 4.2 4.4  CL 103 102 105 103 103  CO2 22 21 23 25 26   GLUCOSE 138* 135* 119* 98 108*  BUN 26* 27* 25* 18 16  CREATININE 1.27 1.30 1.21 1.14 1.10  CALCIUM 9.1 8.6 8.6 8.9 9.2   CBC:  Recent Labs Lab 08/12/14 0843 08/12/14 2333 08/13/14 0332 08/14/14 0350 08/15/14 0444  WBC 6.9 9.1 7.3 6.3 5.2  NEUTROABS 5.0 6.6  --   --   --   HGB 10.5* 10.2* 9.1* 9.1* 9.8*  HCT 32.3* 30.9* 27.7* 29.1* 30.0*  MCV 102.2* 103.3* 101.8* 102.8* 102.0*  PLT 181 171 164 164 184   BNP: BNP (last 3 results)  Recent Labs  07/05/14 0506  PROBNP 116.1    SIGNED: Time coordinating discharge: Over 30  minutes  Faye Ramsay, MD  [facility] 08/17/2014, 9:55 AM Pager [phone]  If 7PM-7AM, please contact night-coverage [email or web address] Password [email or web address]

## 2014-08-17 NOTE — Discharge Instructions (Signed)

## 2014-08-17 NOTE — Progress Notes (Signed)
  Subjective: Patient reports that his urine remains completely clear. His only complaint is that he is a been unable to have a bowel movement.  Objective: Vital signs in last 24 hours: Temp:  [98 F (36.7 C)-98.6 F (37 C)] 98.1 F (36.7 C) (11/01 0553) Pulse Rate:  [62-83] 73 (11/01 0553) Resp:  [18-20] 18 (11/01 0553) BP: (125-130)/(61-64) 125/61 mmHg (11/01 0553) SpO2:  [96 %-97 %] 96 % (11/01 0553)  Intake/Output from previous day: 10/31 0701 - 11/01 0700 In: 240 [P.O.:240] Out: 360 [Urine:360] Intake/Output this shift:    Physical Exam:  General:alert, cooperative and no distress   Lab Results:  Recent Labs  08/15/14 0444  HGB 9.8*  HCT 30.0*   BMET  Recent Labs  08/15/14 0444  NA 140  K 4.4  CL 103  CO2 26  GLUCOSE 108*  BUN 16  CREATININE 1.10  CALCIUM 9.2   No results for input(s): LABPT, INR in the last 72 hours. No results for input(s): LABURIN in the last 72 hours. Results for orders placed or performed during the hospital encounter of 08/12/14  Urine culture     Status: None   Collection Time: 08/13/14 12:16 AM  Result Value Ref Range Status   Specimen Description URINE, RANDOM  Final   Special Requests NONE  Final   Culture  Setup Time   Final    08/13/2014 04:33 Performed at [facility] Performed at [facility]  Final   Culture NO GROWTH Performed at [facility]  Final   Report Status 08/14/2014 FINAL  Final    Studies/Results: No results found.  Assessment/Plan: His urine has remained clear for over 2 days now. He is having difficulty with constipation and we did discuss that he needs to try to not straining with his bowel movements as much as possible as this could possibly result in recurrent hematuria. He said that he is aware of this. With his constipation has been resolved it would appear he is ready for discharge from a urologic standpoint.  His constipation is being  addressed.  Will be following up as an outpatient with Dr. Diona Fanti.   LOS: 5 days   Roquel Burgin C 08/17/2014, 8:31 AM

## 2014-08-18 DIAGNOSIS — R339 Retention of urine, unspecified: Secondary | ICD-10-CM | POA: Diagnosis not present

## 2014-08-18 DIAGNOSIS — I2609 Other pulmonary embolism with acute cor pulmonale: Secondary | ICD-10-CM | POA: Diagnosis not present

## 2014-08-18 DIAGNOSIS — E785 Hyperlipidemia, unspecified: Secondary | ICD-10-CM | POA: Diagnosis not present

## 2014-08-18 DIAGNOSIS — I10 Essential (primary) hypertension: Secondary | ICD-10-CM | POA: Diagnosis not present

## 2014-08-18 DIAGNOSIS — M069 Rheumatoid arthritis, unspecified: Secondary | ICD-10-CM | POA: Diagnosis not present

## 2014-08-18 DIAGNOSIS — I2510 Atherosclerotic heart disease of native coronary artery without angina pectoris: Secondary | ICD-10-CM | POA: Diagnosis not present

## 2014-08-20 DIAGNOSIS — I2609 Other pulmonary embolism with acute cor pulmonale: Secondary | ICD-10-CM | POA: Diagnosis not present

## 2014-08-20 DIAGNOSIS — I10 Essential (primary) hypertension: Secondary | ICD-10-CM | POA: Diagnosis not present

## 2014-08-20 DIAGNOSIS — R339 Retention of urine, unspecified: Secondary | ICD-10-CM | POA: Diagnosis not present

## 2014-08-20 DIAGNOSIS — E785 Hyperlipidemia, unspecified: Secondary | ICD-10-CM | POA: Diagnosis not present

## 2014-08-20 DIAGNOSIS — I2510 Atherosclerotic heart disease of native coronary artery without angina pectoris: Secondary | ICD-10-CM | POA: Diagnosis not present

## 2014-08-20 DIAGNOSIS — M069 Rheumatoid arthritis, unspecified: Secondary | ICD-10-CM | POA: Diagnosis not present

## 2014-08-21 DIAGNOSIS — R310 Gross hematuria: Secondary | ICD-10-CM | POA: Diagnosis not present

## 2014-08-21 DIAGNOSIS — N390 Urinary tract infection, site not specified: Secondary | ICD-10-CM | POA: Diagnosis not present

## 2014-08-22 DIAGNOSIS — I2609 Other pulmonary embolism with acute cor pulmonale: Secondary | ICD-10-CM | POA: Diagnosis not present

## 2014-08-22 DIAGNOSIS — M069 Rheumatoid arthritis, unspecified: Secondary | ICD-10-CM | POA: Diagnosis not present

## 2014-08-22 DIAGNOSIS — E785 Hyperlipidemia, unspecified: Secondary | ICD-10-CM | POA: Diagnosis not present

## 2014-08-22 DIAGNOSIS — I2510 Atherosclerotic heart disease of native coronary artery without angina pectoris: Secondary | ICD-10-CM | POA: Diagnosis not present

## 2014-08-22 DIAGNOSIS — R339 Retention of urine, unspecified: Secondary | ICD-10-CM | POA: Diagnosis not present

## 2014-08-22 DIAGNOSIS — I10 Essential (primary) hypertension: Secondary | ICD-10-CM | POA: Diagnosis not present

## 2014-08-25 DIAGNOSIS — E785 Hyperlipidemia, unspecified: Secondary | ICD-10-CM | POA: Diagnosis not present

## 2014-08-25 DIAGNOSIS — I209 Angina pectoris, unspecified: Secondary | ICD-10-CM | POA: Diagnosis not present

## 2014-08-25 DIAGNOSIS — I10 Essential (primary) hypertension: Secondary | ICD-10-CM | POA: Diagnosis not present

## 2014-08-25 DIAGNOSIS — D649 Anemia, unspecified: Secondary | ICD-10-CM | POA: Diagnosis not present

## 2014-08-25 DIAGNOSIS — I2699 Other pulmonary embolism without acute cor pulmonale: Secondary | ICD-10-CM | POA: Diagnosis not present

## 2014-08-25 DIAGNOSIS — K5900 Constipation, unspecified: Secondary | ICD-10-CM | POA: Diagnosis not present

## 2014-08-26 ENCOUNTER — Encounter (HOSPITAL_BASED_OUTPATIENT_CLINIC_OR_DEPARTMENT_OTHER): Payer: Medicare Other | Attending: General Surgery

## 2014-08-26 DIAGNOSIS — C61 Malignant neoplasm of prostate: Secondary | ICD-10-CM | POA: Insufficient documentation

## 2014-08-26 DIAGNOSIS — B9689 Other specified bacterial agents as the cause of diseases classified elsewhere: Secondary | ICD-10-CM | POA: Insufficient documentation

## 2014-08-26 DIAGNOSIS — N3041 Irradiation cystitis with hematuria: Secondary | ICD-10-CM | POA: Diagnosis not present

## 2014-08-27 DIAGNOSIS — I10 Essential (primary) hypertension: Secondary | ICD-10-CM | POA: Diagnosis not present

## 2014-08-27 DIAGNOSIS — I2510 Atherosclerotic heart disease of native coronary artery without angina pectoris: Secondary | ICD-10-CM | POA: Diagnosis not present

## 2014-08-27 DIAGNOSIS — M069 Rheumatoid arthritis, unspecified: Secondary | ICD-10-CM | POA: Diagnosis not present

## 2014-08-27 DIAGNOSIS — I2609 Other pulmonary embolism with acute cor pulmonale: Secondary | ICD-10-CM | POA: Diagnosis not present

## 2014-08-27 DIAGNOSIS — R339 Retention of urine, unspecified: Secondary | ICD-10-CM | POA: Diagnosis not present

## 2014-08-27 DIAGNOSIS — E785 Hyperlipidemia, unspecified: Secondary | ICD-10-CM | POA: Diagnosis not present

## 2014-08-27 NOTE — H&P (Signed)
[name], Daniel Vega NO.:  [id number]  MEDICAL RECORD NO.:  [id number]  LOCATION:  FOOT                         FACILITY:  [facility]  PHYSICIAN:  Elesa Hacker, M.D.        DATE OF BIRTH:  Jan 23, 1941  DATE OF ADMISSION:  08/26/2014 DATE OF DISCHARGE:                             HISTORY & PHYSICAL   CHIEF COMPLAINT:  Painful urination.  HISTORY OF PRESENT ILLNESS:  This is a 73 year old male treated recently for carcinoma of the prostate with implant of radiation seeds.  He was relatively well until he developed a pulmonary embolism 3 months ago. He was anticoagulated and began having extensive bloody urine, this was associated with dysuria.  He has been seen by Dr. Diona Fanti and has been cystoscoped several times, has been treated with multiple antibiotics. Dr. Diona Fanti now recommends hyperbaric treatment.  PAST MEDICAL HISTORY:  Significant for remote myocardial infarction, arthritis, gout, recent DVT, hypercholesterolemia, hypertension, kidney stones, prostate cancer, lung tumor, thrombophlebitis.  PAST SURGICAL HISTORY:  Back surgery, lung lobectomy, TUR of prostate, varicose vein ligation, and radiation seed implant.  SOCIAL HISTORY:  Cigarettes none.  Alcohol none.  MEDICATIONS:  Atorvastatin, allopurinol, hydrocodone, zolpidem, alprazolam, verapamil, folic acid, Nitrostat, tamsulosin, Uribel, and Bactrim.  REVIEW OF SYSTEMS:  As above.  PHYSICAL EXAMINATION:  VITAL SIGNS: Temp 97.8, pulse 80, respirations 24, blood pressure 121/67. GENERAL APPEARANCE:  Well developed, well nourished, no distress. CHEST:  Clear. HEART:  Regular rhythm.  IMPRESSION:  Radiation cystitis per Dr. Diona Fanti.  PLAN:  He has chest x-ray with no sign of change and an echocardiogram which reveals 60% ejection fraction.  He should be a reasonable candidate for hyperbaric oxygen treatment.     Elesa Hacker, M.D.     RA/MEDQ  D:  08/26/2014  T:  08/26/2014  Job:   [id number]  cc:   Lillette Boxer. Dahlstedt, M.D.

## 2014-08-28 DIAGNOSIS — R339 Retention of urine, unspecified: Secondary | ICD-10-CM | POA: Diagnosis not present

## 2014-08-28 DIAGNOSIS — I2510 Atherosclerotic heart disease of native coronary artery without angina pectoris: Secondary | ICD-10-CM | POA: Diagnosis not present

## 2014-08-28 DIAGNOSIS — C61 Malignant neoplasm of prostate: Secondary | ICD-10-CM | POA: Diagnosis not present

## 2014-08-28 DIAGNOSIS — M069 Rheumatoid arthritis, unspecified: Secondary | ICD-10-CM | POA: Diagnosis not present

## 2014-08-28 DIAGNOSIS — I2609 Other pulmonary embolism with acute cor pulmonale: Secondary | ICD-10-CM | POA: Diagnosis not present

## 2014-08-28 DIAGNOSIS — I10 Essential (primary) hypertension: Secondary | ICD-10-CM | POA: Diagnosis not present

## 2014-08-28 DIAGNOSIS — N3041 Irradiation cystitis with hematuria: Secondary | ICD-10-CM | POA: Diagnosis not present

## 2014-08-28 DIAGNOSIS — E785 Hyperlipidemia, unspecified: Secondary | ICD-10-CM | POA: Diagnosis not present

## 2014-08-28 DIAGNOSIS — B9689 Other specified bacterial agents as the cause of diseases classified elsewhere: Secondary | ICD-10-CM | POA: Diagnosis not present

## 2014-08-29 DIAGNOSIS — I2510 Atherosclerotic heart disease of native coronary artery without angina pectoris: Secondary | ICD-10-CM | POA: Diagnosis not present

## 2014-08-29 DIAGNOSIS — I2609 Other pulmonary embolism with acute cor pulmonale: Secondary | ICD-10-CM | POA: Diagnosis not present

## 2014-08-29 DIAGNOSIS — C61 Malignant neoplasm of prostate: Secondary | ICD-10-CM | POA: Diagnosis not present

## 2014-08-29 DIAGNOSIS — N3041 Irradiation cystitis with hematuria: Secondary | ICD-10-CM | POA: Diagnosis not present

## 2014-08-29 DIAGNOSIS — R339 Retention of urine, unspecified: Secondary | ICD-10-CM | POA: Diagnosis not present

## 2014-08-29 DIAGNOSIS — I10 Essential (primary) hypertension: Secondary | ICD-10-CM | POA: Diagnosis not present

## 2014-08-29 DIAGNOSIS — B9689 Other specified bacterial agents as the cause of diseases classified elsewhere: Secondary | ICD-10-CM | POA: Diagnosis not present

## 2014-08-29 DIAGNOSIS — M069 Rheumatoid arthritis, unspecified: Secondary | ICD-10-CM | POA: Diagnosis not present

## 2014-08-29 DIAGNOSIS — E785 Hyperlipidemia, unspecified: Secondary | ICD-10-CM | POA: Diagnosis not present

## 2014-09-01 DIAGNOSIS — B9689 Other specified bacterial agents as the cause of diseases classified elsewhere: Secondary | ICD-10-CM | POA: Diagnosis not present

## 2014-09-01 DIAGNOSIS — N3041 Irradiation cystitis with hematuria: Secondary | ICD-10-CM | POA: Diagnosis not present

## 2014-09-01 DIAGNOSIS — C61 Malignant neoplasm of prostate: Secondary | ICD-10-CM | POA: Diagnosis not present

## 2014-09-02 DIAGNOSIS — B9689 Other specified bacterial agents as the cause of diseases classified elsewhere: Secondary | ICD-10-CM | POA: Diagnosis not present

## 2014-09-02 DIAGNOSIS — I10 Essential (primary) hypertension: Secondary | ICD-10-CM | POA: Diagnosis not present

## 2014-09-02 DIAGNOSIS — N3041 Irradiation cystitis with hematuria: Secondary | ICD-10-CM | POA: Diagnosis not present

## 2014-09-02 DIAGNOSIS — R339 Retention of urine, unspecified: Secondary | ICD-10-CM | POA: Diagnosis not present

## 2014-09-02 DIAGNOSIS — I2609 Other pulmonary embolism with acute cor pulmonale: Secondary | ICD-10-CM | POA: Diagnosis not present

## 2014-09-02 DIAGNOSIS — E785 Hyperlipidemia, unspecified: Secondary | ICD-10-CM | POA: Diagnosis not present

## 2014-09-02 DIAGNOSIS — I2510 Atherosclerotic heart disease of native coronary artery without angina pectoris: Secondary | ICD-10-CM | POA: Diagnosis not present

## 2014-09-02 DIAGNOSIS — C61 Malignant neoplasm of prostate: Secondary | ICD-10-CM | POA: Diagnosis not present

## 2014-09-02 DIAGNOSIS — M069 Rheumatoid arthritis, unspecified: Secondary | ICD-10-CM | POA: Diagnosis not present

## 2014-09-03 DIAGNOSIS — N3041 Irradiation cystitis with hematuria: Secondary | ICD-10-CM | POA: Diagnosis not present

## 2014-09-03 DIAGNOSIS — B9689 Other specified bacterial agents as the cause of diseases classified elsewhere: Secondary | ICD-10-CM | POA: Diagnosis not present

## 2014-09-03 DIAGNOSIS — C61 Malignant neoplasm of prostate: Secondary | ICD-10-CM | POA: Diagnosis not present

## 2014-09-04 DIAGNOSIS — B9689 Other specified bacterial agents as the cause of diseases classified elsewhere: Secondary | ICD-10-CM | POA: Diagnosis not present

## 2014-09-04 DIAGNOSIS — N3041 Irradiation cystitis with hematuria: Secondary | ICD-10-CM | POA: Diagnosis not present

## 2014-09-04 DIAGNOSIS — C61 Malignant neoplasm of prostate: Secondary | ICD-10-CM | POA: Diagnosis not present

## 2014-09-04 DIAGNOSIS — I809 Phlebitis and thrombophlebitis of unspecified site: Secondary | ICD-10-CM | POA: Diagnosis not present

## 2014-09-05 DIAGNOSIS — B9689 Other specified bacterial agents as the cause of diseases classified elsewhere: Secondary | ICD-10-CM | POA: Diagnosis not present

## 2014-09-05 DIAGNOSIS — C61 Malignant neoplasm of prostate: Secondary | ICD-10-CM | POA: Diagnosis not present

## 2014-09-05 DIAGNOSIS — M069 Rheumatoid arthritis, unspecified: Secondary | ICD-10-CM | POA: Diagnosis not present

## 2014-09-05 DIAGNOSIS — I2609 Other pulmonary embolism with acute cor pulmonale: Secondary | ICD-10-CM | POA: Diagnosis not present

## 2014-09-05 DIAGNOSIS — I2510 Atherosclerotic heart disease of native coronary artery without angina pectoris: Secondary | ICD-10-CM | POA: Diagnosis not present

## 2014-09-05 DIAGNOSIS — I10 Essential (primary) hypertension: Secondary | ICD-10-CM | POA: Diagnosis not present

## 2014-09-05 DIAGNOSIS — N3041 Irradiation cystitis with hematuria: Secondary | ICD-10-CM | POA: Diagnosis not present

## 2014-09-05 DIAGNOSIS — R339 Retention of urine, unspecified: Secondary | ICD-10-CM | POA: Diagnosis not present

## 2014-09-05 DIAGNOSIS — E785 Hyperlipidemia, unspecified: Secondary | ICD-10-CM | POA: Diagnosis not present

## 2014-09-08 DIAGNOSIS — N3041 Irradiation cystitis with hematuria: Secondary | ICD-10-CM | POA: Diagnosis not present

## 2014-09-08 DIAGNOSIS — B9689 Other specified bacterial agents as the cause of diseases classified elsewhere: Secondary | ICD-10-CM | POA: Diagnosis not present

## 2014-09-08 DIAGNOSIS — C61 Malignant neoplasm of prostate: Secondary | ICD-10-CM | POA: Diagnosis not present

## 2014-09-09 DIAGNOSIS — B9689 Other specified bacterial agents as the cause of diseases classified elsewhere: Secondary | ICD-10-CM | POA: Diagnosis not present

## 2014-09-09 DIAGNOSIS — N3041 Irradiation cystitis with hematuria: Secondary | ICD-10-CM | POA: Diagnosis not present

## 2014-09-09 DIAGNOSIS — C61 Malignant neoplasm of prostate: Secondary | ICD-10-CM | POA: Diagnosis not present

## 2014-09-10 DIAGNOSIS — B9689 Other specified bacterial agents as the cause of diseases classified elsewhere: Secondary | ICD-10-CM | POA: Diagnosis not present

## 2014-09-10 DIAGNOSIS — C61 Malignant neoplasm of prostate: Secondary | ICD-10-CM | POA: Diagnosis not present

## 2014-09-10 DIAGNOSIS — N3041 Irradiation cystitis with hematuria: Secondary | ICD-10-CM | POA: Diagnosis not present

## 2014-09-15 DIAGNOSIS — C61 Malignant neoplasm of prostate: Secondary | ICD-10-CM | POA: Diagnosis not present

## 2014-09-15 DIAGNOSIS — B9689 Other specified bacterial agents as the cause of diseases classified elsewhere: Secondary | ICD-10-CM | POA: Diagnosis not present

## 2014-09-15 DIAGNOSIS — N3041 Irradiation cystitis with hematuria: Secondary | ICD-10-CM | POA: Diagnosis not present

## 2014-09-16 ENCOUNTER — Encounter (HOSPITAL_BASED_OUTPATIENT_CLINIC_OR_DEPARTMENT_OTHER): Payer: Medicare Other | Attending: General Surgery

## 2014-09-16 DIAGNOSIS — N3040 Irradiation cystitis without hematuria: Secondary | ICD-10-CM | POA: Insufficient documentation

## 2014-09-17 DIAGNOSIS — D649 Anemia, unspecified: Secondary | ICD-10-CM | POA: Diagnosis not present

## 2014-09-17 DIAGNOSIS — N3040 Irradiation cystitis without hematuria: Secondary | ICD-10-CM | POA: Diagnosis not present

## 2014-09-17 DIAGNOSIS — I2699 Other pulmonary embolism without acute cor pulmonale: Secondary | ICD-10-CM | POA: Diagnosis not present

## 2014-09-17 DIAGNOSIS — I10 Essential (primary) hypertension: Secondary | ICD-10-CM | POA: Diagnosis not present

## 2014-09-17 DIAGNOSIS — I209 Angina pectoris, unspecified: Secondary | ICD-10-CM | POA: Diagnosis not present

## 2014-09-17 DIAGNOSIS — K5900 Constipation, unspecified: Secondary | ICD-10-CM | POA: Diagnosis not present

## 2014-09-17 DIAGNOSIS — E785 Hyperlipidemia, unspecified: Secondary | ICD-10-CM | POA: Diagnosis not present

## 2014-09-18 DIAGNOSIS — N3040 Irradiation cystitis without hematuria: Secondary | ICD-10-CM | POA: Diagnosis not present

## 2014-09-19 DIAGNOSIS — N3040 Irradiation cystitis without hematuria: Secondary | ICD-10-CM | POA: Diagnosis not present

## 2014-09-22 DIAGNOSIS — C61 Malignant neoplasm of prostate: Secondary | ICD-10-CM | POA: Diagnosis not present

## 2014-09-22 DIAGNOSIS — N3040 Irradiation cystitis without hematuria: Secondary | ICD-10-CM | POA: Diagnosis not present

## 2014-09-22 DIAGNOSIS — R310 Gross hematuria: Secondary | ICD-10-CM | POA: Diagnosis not present

## 2014-09-22 DIAGNOSIS — N390 Urinary tract infection, site not specified: Secondary | ICD-10-CM | POA: Diagnosis not present

## 2014-09-23 DIAGNOSIS — N3040 Irradiation cystitis without hematuria: Secondary | ICD-10-CM | POA: Diagnosis not present

## 2014-09-24 DIAGNOSIS — N3040 Irradiation cystitis without hematuria: Secondary | ICD-10-CM | POA: Diagnosis not present

## 2014-09-25 DIAGNOSIS — N3040 Irradiation cystitis without hematuria: Secondary | ICD-10-CM | POA: Diagnosis not present

## 2014-09-26 DIAGNOSIS — N3040 Irradiation cystitis without hematuria: Secondary | ICD-10-CM | POA: Diagnosis not present

## 2014-09-29 DIAGNOSIS — N3040 Irradiation cystitis without hematuria: Secondary | ICD-10-CM | POA: Diagnosis not present

## 2014-09-30 DIAGNOSIS — N3040 Irradiation cystitis without hematuria: Secondary | ICD-10-CM | POA: Diagnosis not present

## 2014-10-01 DIAGNOSIS — N3040 Irradiation cystitis without hematuria: Secondary | ICD-10-CM | POA: Diagnosis not present

## 2014-10-02 DIAGNOSIS — N3040 Irradiation cystitis without hematuria: Secondary | ICD-10-CM | POA: Diagnosis not present

## 2014-10-03 DIAGNOSIS — N3040 Irradiation cystitis without hematuria: Secondary | ICD-10-CM | POA: Diagnosis not present

## 2014-10-06 DIAGNOSIS — N3040 Irradiation cystitis without hematuria: Secondary | ICD-10-CM | POA: Diagnosis not present

## 2014-10-07 ENCOUNTER — Other Ambulatory Visit: Payer: Self-pay | Admitting: Family Medicine

## 2014-10-07 DIAGNOSIS — N3040 Irradiation cystitis without hematuria: Secondary | ICD-10-CM | POA: Diagnosis not present

## 2014-10-07 DIAGNOSIS — R911 Solitary pulmonary nodule: Secondary | ICD-10-CM

## 2014-10-08 DIAGNOSIS — N3040 Irradiation cystitis without hematuria: Secondary | ICD-10-CM | POA: Diagnosis not present

## 2014-10-09 DIAGNOSIS — N3040 Irradiation cystitis without hematuria: Secondary | ICD-10-CM | POA: Diagnosis not present

## 2014-10-13 DIAGNOSIS — N3040 Irradiation cystitis without hematuria: Secondary | ICD-10-CM | POA: Diagnosis not present

## 2014-10-14 DIAGNOSIS — N3040 Irradiation cystitis without hematuria: Secondary | ICD-10-CM | POA: Diagnosis not present

## 2014-10-15 DIAGNOSIS — N3040 Irradiation cystitis without hematuria: Secondary | ICD-10-CM | POA: Diagnosis not present

## 2014-10-16 ENCOUNTER — Ambulatory Visit
Admission: RE | Admit: 2014-10-16 | Discharge: 2014-10-16 | Disposition: A | Discharge status: Home / Self Care | Payer: Medicare Other | Source: Ambulatory Visit | Attending: Family Medicine | Admitting: Family Medicine

## 2014-10-16 DIAGNOSIS — R911 Solitary pulmonary nodule: Secondary | ICD-10-CM | POA: Diagnosis not present

## 2014-10-16 DIAGNOSIS — Z8546 Personal history of malignant neoplasm of prostate: Secondary | ICD-10-CM | POA: Diagnosis not present

## 2014-10-16 DIAGNOSIS — Z85118 Personal history of other malignant neoplasm of bronchus and lung: Secondary | ICD-10-CM | POA: Diagnosis not present

## 2014-10-16 DIAGNOSIS — N3040 Irradiation cystitis without hematuria: Secondary | ICD-10-CM | POA: Diagnosis not present

## 2014-10-16 DIAGNOSIS — I2510 Atherosclerotic heart disease of native coronary artery without angina pectoris: Secondary | ICD-10-CM | POA: Diagnosis not present

## 2014-10-20 ENCOUNTER — Encounter (HOSPITAL_BASED_OUTPATIENT_CLINIC_OR_DEPARTMENT_OTHER): Payer: Medicare Other | Attending: General Surgery

## 2014-10-20 DIAGNOSIS — B9689 Other specified bacterial agents as the cause of diseases classified elsewhere: Secondary | ICD-10-CM | POA: Insufficient documentation

## 2014-10-20 DIAGNOSIS — N3040 Irradiation cystitis without hematuria: Secondary | ICD-10-CM | POA: Insufficient documentation

## 2014-10-21 DIAGNOSIS — N3040 Irradiation cystitis without hematuria: Secondary | ICD-10-CM | POA: Diagnosis not present

## 2014-10-21 DIAGNOSIS — B9689 Other specified bacterial agents as the cause of diseases classified elsewhere: Secondary | ICD-10-CM | POA: Diagnosis not present

## 2014-10-22 DIAGNOSIS — N3040 Irradiation cystitis without hematuria: Secondary | ICD-10-CM | POA: Diagnosis not present

## 2014-10-22 DIAGNOSIS — B9689 Other specified bacterial agents as the cause of diseases classified elsewhere: Secondary | ICD-10-CM | POA: Diagnosis not present

## 2014-10-23 DIAGNOSIS — B9689 Other specified bacterial agents as the cause of diseases classified elsewhere: Secondary | ICD-10-CM | POA: Diagnosis not present

## 2014-10-23 DIAGNOSIS — N3040 Irradiation cystitis without hematuria: Secondary | ICD-10-CM | POA: Diagnosis not present

## 2014-10-24 DIAGNOSIS — B9689 Other specified bacterial agents as the cause of diseases classified elsewhere: Secondary | ICD-10-CM | POA: Diagnosis not present

## 2014-10-24 DIAGNOSIS — N3040 Irradiation cystitis without hematuria: Secondary | ICD-10-CM | POA: Diagnosis not present

## 2014-10-27 DIAGNOSIS — N3040 Irradiation cystitis without hematuria: Secondary | ICD-10-CM | POA: Diagnosis not present

## 2014-10-27 DIAGNOSIS — B9689 Other specified bacterial agents as the cause of diseases classified elsewhere: Secondary | ICD-10-CM | POA: Diagnosis not present

## 2014-10-28 DIAGNOSIS — B9689 Other specified bacterial agents as the cause of diseases classified elsewhere: Secondary | ICD-10-CM | POA: Diagnosis not present

## 2014-10-28 DIAGNOSIS — N3040 Irradiation cystitis without hematuria: Secondary | ICD-10-CM | POA: Diagnosis not present

## 2014-11-26 DIAGNOSIS — R310 Gross hematuria: Secondary | ICD-10-CM | POA: Diagnosis not present

## 2014-11-26 DIAGNOSIS — Z8546 Personal history of malignant neoplasm of prostate: Secondary | ICD-10-CM | POA: Diagnosis not present

## 2014-11-26 DIAGNOSIS — N3091 Cystitis, unspecified with hematuria: Secondary | ICD-10-CM | POA: Diagnosis not present

## 2014-11-26 DIAGNOSIS — N471 Phimosis: Secondary | ICD-10-CM | POA: Diagnosis not present

## 2014-12-03 DIAGNOSIS — R310 Gross hematuria: Secondary | ICD-10-CM | POA: Diagnosis not present

## 2014-12-03 DIAGNOSIS — N3289 Other specified disorders of bladder: Secondary | ICD-10-CM | POA: Diagnosis not present

## 2014-12-10 DIAGNOSIS — Z8546 Personal history of malignant neoplasm of prostate: Secondary | ICD-10-CM | POA: Diagnosis not present

## 2014-12-10 DIAGNOSIS — Z86010 Personal history of colonic polyps: Secondary | ICD-10-CM | POA: Diagnosis not present

## 2014-12-10 DIAGNOSIS — I2510 Atherosclerotic heart disease of native coronary artery without angina pectoris: Secondary | ICD-10-CM | POA: Diagnosis not present

## 2014-12-10 DIAGNOSIS — R7309 Other abnormal glucose: Secondary | ICD-10-CM | POA: Diagnosis not present

## 2014-12-10 DIAGNOSIS — I10 Essential (primary) hypertension: Secondary | ICD-10-CM | POA: Diagnosis not present

## 2014-12-10 DIAGNOSIS — I700 Atherosclerosis of aorta: Secondary | ICD-10-CM | POA: Diagnosis not present

## 2014-12-10 DIAGNOSIS — M109 Gout, unspecified: Secondary | ICD-10-CM | POA: Diagnosis not present

## 2014-12-10 DIAGNOSIS — M064 Inflammatory polyarthropathy: Secondary | ICD-10-CM | POA: Diagnosis not present

## 2014-12-10 DIAGNOSIS — E785 Hyperlipidemia, unspecified: Secondary | ICD-10-CM | POA: Diagnosis not present

## 2014-12-11 ENCOUNTER — Telehealth: Payer: Self-pay | Admitting: Cardiology

## 2014-12-11 NOTE — Telephone Encounter (Signed)
Received records from [facility] for appointment on 01/26/15 with Dr Martinique.  Records given to [facility] (medical records) for Dr Doug Sou schedule on 01/26/15.  lp

## 2015-01-26 ENCOUNTER — Emergency Department (HOSPITAL_COMMUNITY)
Admission: EM | Admit: 2015-01-26 | Discharge: 2015-01-26 | Disposition: A | Discharge status: Home / Self Care | Payer: Medicare Other | Attending: Emergency Medicine | Admitting: Emergency Medicine

## 2015-01-26 ENCOUNTER — Ambulatory Visit: Payer: Medicare Other | Admitting: Cardiology

## 2015-01-26 ENCOUNTER — Encounter (HOSPITAL_COMMUNITY): Payer: Self-pay | Admitting: Emergency Medicine

## 2015-01-26 DIAGNOSIS — Z87891 Personal history of nicotine dependence: Secondary | ICD-10-CM | POA: Insufficient documentation

## 2015-01-26 DIAGNOSIS — R339 Retention of urine, unspecified: Secondary | ICD-10-CM

## 2015-01-26 DIAGNOSIS — Z7982 Long term (current) use of aspirin: Secondary | ICD-10-CM | POA: Diagnosis not present

## 2015-01-26 DIAGNOSIS — Z85118 Personal history of other malignant neoplasm of bronchus and lung: Secondary | ICD-10-CM | POA: Insufficient documentation

## 2015-01-26 DIAGNOSIS — Z86010 Personal history of colonic polyps: Secondary | ICD-10-CM | POA: Diagnosis not present

## 2015-01-26 DIAGNOSIS — I10 Essential (primary) hypertension: Secondary | ICD-10-CM | POA: Insufficient documentation

## 2015-01-26 DIAGNOSIS — I2510 Atherosclerotic heart disease of native coronary artery without angina pectoris: Secondary | ICD-10-CM | POA: Diagnosis not present

## 2015-01-26 DIAGNOSIS — R319 Hematuria, unspecified: Secondary | ICD-10-CM | POA: Insufficient documentation

## 2015-01-26 DIAGNOSIS — Z87442 Personal history of urinary calculi: Secondary | ICD-10-CM | POA: Diagnosis not present

## 2015-01-26 DIAGNOSIS — E780 Pure hypercholesterolemia: Secondary | ICD-10-CM | POA: Diagnosis not present

## 2015-01-26 DIAGNOSIS — Z79899 Other long term (current) drug therapy: Secondary | ICD-10-CM | POA: Diagnosis not present

## 2015-01-26 DIAGNOSIS — Z8546 Personal history of malignant neoplasm of prostate: Secondary | ICD-10-CM | POA: Diagnosis not present

## 2015-01-26 LAB — URINALYSIS, ROUTINE W REFLEX MICROSCOPIC
GLUCOSE, UA: 100 mg/dL — AB
Ketones, ur: 15 mg/dL — AB
Nitrite: NEGATIVE
PROTEIN: 100 mg/dL — AB
Specific Gravity, Urine: 1.014 (ref 1.005–1.030)
Urobilinogen, UA: 1 mg/dL (ref 0.0–1.0)
pH: 6 (ref 5.0–8.0)

## 2015-01-26 LAB — URINE MICROSCOPIC-ADD ON

## 2015-01-26 NOTE — Discharge Instructions (Signed)
Hematuria Hematuria is blood in your urine. It can be caused by a bladder infection, kidney infection, prostate infection, kidney stone, or cancer of your urinary tract. Infections can usually be treated with medicine, and a kidney stone usually will pass through your urine. If neither of these is the cause of your hematuria, further workup to find out the reason may be needed. It is very important that you tell your health care provider about any blood you see in your urine, even if the blood stops without treatment or happens without causing pain. Blood in your urine that happens and then stops and then happens again can be a symptom of a very serious condition. Also, pain is not a symptom in the initial stages of many urinary cancers. HOME CARE INSTRUCTIONS   Drink lots of fluid, 3-4 quarts a day. If you have been diagnosed with an infection, cranberry juice is especially recommended, in addition to large amounts of water.  Avoid caffeine, tea, and carbonated beverages because they tend to irritate the bladder.  Avoid alcohol because it may irritate the prostate.  Take all medicines as directed by your health care provider.  If you were prescribed an antibiotic medicine, finish it all even if you start to feel better.  If you have been diagnosed with a kidney stone, follow your health care provider's instructions regarding straining your urine to catch the stone.  Empty your bladder often. Avoid holding urine for long periods of time.  After a bowel movement, women should cleanse front to back. Use each tissue only once.  Empty your bladder before and after sexual intercourse if you are a male. SEEK MEDICAL CARE IF: 1. You develop back pain. 2. You have a fever. 3. You have a feeling of sickness in your stomach (nausea) or vomiting. 4. Your symptoms are not better in 3 days. Return sooner if you are getting worse. SEEK IMMEDIATE MEDICAL CARE IF:  1. You develop severe vomiting and  are unable to keep the medicine down. 2. You develop severe back or abdominal pain despite taking your medicines. 3. You begin passing a large amount of blood or clots in your urine. 4. You feel extremely weak or faint, or you pass out. MAKE SURE YOU:  1. Understand these instructions. 2. Will watch your condition. 3. Will get help right away if you are not doing well or get worse. Document Released: 10/03/2005 Document Revised: 02/17/2014 Document Reviewed: 06/03/2013 [facility] Patient Information 2015 [place], [place]. This information is not intended to replace advice given to you by your health care provider. Make sure you discuss any questions you have with your health care provider. Foley Catheter Care A Foley catheter is a soft, flexible tube that is placed into the bladder to drain urine. A Foley catheter may be inserted if:  You leak urine or are not able to control when you urinate (urinary incontinence).  You are not able to urinate when you need to (urinary retention).  You had prostate surgery or surgery on the genitals.  You have certain medical conditions, such as multiple sclerosis, dementia, or a spinal cord injury. If you are going home with a Foley catheter in place, follow the instructions below. TAKING CARE OF THE CATHETER 5. Wash your hands with soap and water. 6. Using mild soap and warm water on a clean washcloth:  Clean the area on your body closest to the catheter insertion site using a circular motion, moving away from the catheter. Never wipe toward  the catheter because this could sweep bacteria up into the urethra and cause infection.  Remove all traces of soap. Pat the area dry with a clean towel. For males, reposition the foreskin. 7. Attach the catheter to your leg so there is no tension on the catheter. Use adhesive tape or a leg strap. If you are using adhesive tape, remove any sticky residue left behind by the previous tape you used. 8. Keep the drainage  bag below the level of the bladder, but keep it off the floor. 9. Check throughout the day to be sure the catheter is working and urine is draining freely. Make sure the tubing does not become kinked. 10. Do not pull on the catheter or try to remove it. Pulling could damage internal tissues. TAKING CARE OF THE DRAINAGE BAGS You will be given two drainage bags to take home. One is a large overnight drainage bag, and the other is a smaller leg bag that fits underneath clothing. You may wear the overnight bag at any time, but you should never wear the smaller leg bag at night. Follow the instructions below for how to empty, change, and clean your drainage bags. Emptying the Drainage Bag You must empty your drainage bag when it is  - full or at least 2-3 times a day. 5. Wash your hands with soap and water. 6. Keep the drainage bag below your hips, below the level of your bladder. This stops urine from going back into the tubing and into your bladder. 7. Hold the dirty bag over the toilet or a clean container. 8. Open the pour spout at the bottom of the bag and empty the urine into the toilet or container. Do not let the pour spout touch the toilet, container, or any other surface. Doing so can place bacteria on the bag, which can cause an infection. 9. Clean the pour spout with a gauze pad or cotton ball that has rubbing alcohol on it. 10. Close the pour spout. 11. Attach the bag to your leg with adhesive tape or a leg strap. 12. Wash your hands well. Changing the Drainage Bag Change your drainage bag once a month or sooner if it starts to smell bad or look dirty. Below are steps to follow when changing the drainage bag. 4. Wash your hands with soap and water. 5. Pinch off the rubber catheter so that urine does not spill out. 6. Disconnect the catheter tube from the drainage tube at the connection valve. Do not let the tubes touch any surface. 7. Clean the end of the catheter tube with an alcohol  wipe. Use a different alcohol wipe to clean the end of the drainage tube. 8. Connect the catheter tube to the drainage tube of the clean drainage bag. 9. Attach the new bag to the leg with adhesive tape or a leg strap. Avoid attaching the new bag too tightly. 10. Wash your hands well. Cleaning the Drainage Bag 1. Wash your hands with soap and water. 2. Wash the bag in warm, soapy water. 3. Rinse the bag thoroughly with warm water. 4. Fill the bag with a solution of white vinegar and water (1 cup vinegar to 1 qt warm water [.2 L vinegar to 1 L warm water]). Close the bag and soak it for 30 minutes in the solution. 5. Rinse the bag with warm water. 6. Hang the bag to dry with the pour spout open and hanging downward. 7. Store the clean bag (once it is dry)  in a clean plastic bag. 8. Wash your hands well. PREVENTING INFECTION  Wash your hands before and after handling your catheter.  Take showers daily and wash the area where the catheter enters your body. Do not take baths. Replace wet leg straps with dry ones, if this applies.  Do not use powders, sprays, or lotions on the genital area. Only use creams, lotions, or ointments as directed by your caregiver.  For females, wipe from front to back after each bowel movement.  Drink enough fluids to keep your urine clear or pale yellow unless you have a fluid restriction.  Do not let the drainage bag or tubing touch or lie on the floor.  Wear cotton underwear to absorb moisture and to keep your skin drier. SEEK MEDICAL CARE IF:   Your urine is cloudy or smells unusually bad.  Your catheter becomes clogged.  You are not draining urine into the bag or your bladder feels full.  Your catheter starts to leak. SEEK IMMEDIATE MEDICAL CARE IF:   You have pain, swelling, redness, or pus where the catheter enters the body.  You have pain in the abdomen, legs, lower back, or bladder.  You have a fever.  You see blood fill the catheter, or  your urine is pink or red.  You have nausea, vomiting, or chills.  Your catheter gets pulled out. MAKE SURE YOU:   Understand these instructions.  Will watch your condition.  Will get help right away if you are not doing well or get worse. Document Released: 10/03/2005 Document Revised: 02/17/2014 Document Reviewed: 09/24/2012 [facility] Patient Information 2015 [place], [place]. This information is not intended to replace advice given to you by your health care provider. Make sure you discuss any questions you have with your health care provider.

## 2015-01-26 NOTE — ED Provider Notes (Signed)
CSN: [id number]     Arrival date & time 01/26/15  0245 History   First MD Initiated Contact with Patient 01/26/15 0251     Chief Complaint  Patient presents with  . Urinary Retention     (Consider location/radiation/quality/duration/timing/severity/associated sxs/prior Treatment) HPI Comments: He reports hematuria starting yesterday and being unable to urinate since last night around 10:00 pm. No fever, nausea or vomiting. He has a history of prostate cancer, followed by Dr. Diona Fanti and treated 10 years ago with radioactive seeding. He denies testicular pain.   The history is provided by the patient.    Past Medical History  Diagnosis Date  . Hypertension   . Hypercholesteremia   . Glucose intolerance (impaired glucose tolerance)   . Kidney stones, calcium oxalate   . CAD (coronary artery disease)   . 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 Adenomatous colon polyp     Dr. Amedeo Plenty  . Prostate cancer 2009    Dr. Eulogio Ditch; tx w/ radium implants  . Lung cancer    Past Surgical History  Procedure Laterality Date  . Lobectomy  1999    left - Dr. Cyndia Bent  . Repair right testicular torsion  1962  . Cardiac catheterization  1997    Dr. Martinique  . Prostate radium implants  2009  . Lung cancer surgery  2003  . Colonoscopy  ;11/12/99;04/29/03    12/27/09  . Lumbar laminectomy/decompression microdiscectomy  08/10/2012    Procedure: LUMBAR LAMINECTOMY/DECOMPRESSION MICRODISCECTOMY 1 LEVEL;  Surgeon: Winfield Cunas, MD;  Location: [facility];  Service: Neurosurgery;  Laterality: Left;  LEFT Lumbar five-sacral one microdiskectomy  . Cystoscopy with retrograde pyelogram, ureteroscopy and stent placement Right 07/31/2014    Procedure: CYSTOSCOPY WITH RETROGRADE PYELOGRAM, URETEROSCOPY WITH FULGURATION AND STENT PLACEMENT;  Surgeon: [name], MD;  Location: [facility];  Service: Urology;  Laterality: Right;   Family History  Problem Relation Age of Onset  . Heart disease  Mother   . Hypertension Mother   . Heart disease Father   . Hypertension Father   . Stroke Father   . Other Brother     spinal meningitis  . Other Brother     MVA  . Cancer Sister     stomach   History  Substance Use Topics  . Smoking status: Former Smoker -- 2.00 packs/day for 35 years    Types: Cigarettes    Quit date: 10/17/1982  . Smokeless tobacco: Former Systems developer    Types: Chew    Quit date: 03/15/2012  . Alcohol Use: 7.0 oz/week    14 drink(s) per week     Comment: 2 drinks a day before supper but has not had in 1 month    Review of Systems  Constitutional: Negative for fever and chills.  Gastrointestinal: Negative.  Negative for abdominal pain.  Genitourinary: Positive for hematuria and difficulty urinating.  Musculoskeletal: Negative.   Skin: Negative.   Neurological: Negative.       Allergies  Codeine and Ibuprofen  Home Medications   Prior to Admission medications   Medication Sig Start Date End Date Taking? Authorizing Provider  allopurinol (ZYLOPRIM) 100 MG tablet Take 100 mg by mouth Daily. 07/23/12  Yes Historical Provider, MD  ALPRAZolam Duanne Moron) 0.5 MG tablet Take 0.5 mg by mouth 3 (three) times daily as needed for anxiety (anxiety).    Yes Historical Provider, MD  aspirin EC 81 MG tablet Take 81 mg by mouth every other day.  Yes Historical Provider, MD  atorvastatin (LIPITOR) 40 MG tablet Take 40 mg by mouth every morning.    Yes Historical Provider, MD  Cyanocobalamin (VITAMIN B 12 PO) Take 1,000 mcg by mouth daily.     Yes Historical Provider, MD  docusate sodium (COLACE) 100 MG capsule Take 100 mg by mouth daily.   Yes Historical Provider, MD  finasteride (PROSCAR) 5 MG tablet Take 5 mg by mouth daily.   Yes Historical Provider, MD  glucosamine-chondroitin 500-400 MG tablet Take 1 tablet by mouth 2 (two) times daily.    Yes Historical Provider, MD  meloxicam (MOBIC) 15 MG tablet Take 15 mg by mouth daily.   Yes Historical Provider, MD  verapamil  (CALAN-SR) 240 MG CR tablet Take 240 mg by mouth 2 (two) times daily.     Yes Historical Provider, MD  cephALEXin (KEFLEX) 500 MG capsule Take 1 capsule (500 mg total) by mouth every 12 (twelve) hours. Patient not taking: Reported on 01/26/2015 08/05/14   Robbie Lis, MD  HYDROcodone-acetaminophen (NORCO/VICODIN) 5-325 MG per tablet Take 1-2 tablets by mouth every 4 (four) hours as needed for moderate pain. Patient not taking: Reported on 01/26/2015 08/17/14   Theodis Blaze, MD  nitroGLYCERIN (NITROSTAT) 0.4 MG SL tablet Place 0.4 mg under the tongue every 5 (five) minutes as needed. For chest pain    Historical Provider, MD  tamsulosin (FLOMAX) 0.4 MG CAPS capsule Take 1 capsule (0.4 mg total) by mouth daily. Patient not taking: Reported on 01/26/2015 08/05/14   Robbie Lis, MD  traMADol (ULTRAM) 50 MG tablet Take 1 tablet (50 mg total) by mouth every 6 (six) hours as needed for moderate pain (if response to Tylenol insufficient). Patient not taking: Reported on 01/26/2015 08/05/14   Robbie Lis, MD  zolpidem (AMBIEN) 5 MG tablet Take 1 tablet (5 mg total) by mouth at bedtime as needed for sleep. Patient not taking: Reported on 01/26/2015 08/17/14   Theodis Blaze, MD   BP 153/84 mmHg  Pulse 88  Temp(Src) 98 F (36.7 C) (Oral)  Resp 20  SpO2 99% Physical Exam  Constitutional: He is oriented to person, place, and time. He appears well-developed and well-nourished.  Neck: Normal range of motion.  Pulmonary/Chest: Effort normal.  Abdominal: Soft. He exhibits no distension. There is no tenderness.  Examined after foley placement and 450 cc urine drainage.  Genitourinary: Penis normal. Right testis shows no tenderness. Left testis shows no tenderness. Circumcised.  Musculoskeletal: Normal range of motion.  Neurological: He is alert and oriented to person, place, and time.  Skin: Skin is warm and dry.  Psychiatric: He has a normal mood and affect.    ED Course  Procedures (including  critical care time) Labs Review Labs Reviewed  URINALYSIS, ROUTINE W REFLEX MICROSCOPIC - Abnormal; Notable for the following:    Color, Urine RED (*)    APPearance TURBID (*)    Glucose, UA 100 (*)    Hgb urine dipstick LARGE (*)    Bilirubin Urine MODERATE (*)    Ketones, ur 15 (*)    Protein, ur 100 (*)    Leukocytes, UA SMALL (*)    All other components within normal limits  URINE CULTURE  URINE MICROSCOPIC-ADD ON    Imaging Review No results found.   EKG Interpretation None      MDM   Final diagnoses:  None    1. Urinary retention 2. Hematuria  Foley placed with bloody drainage which lightens  in color over time. No clots. Draining freely. He has urologic follow up in place. Leg bag placed. Patient is stable for discharge.     Charlann Lange, PA-C 01/26/15 0500  Julianne Rice, MD 01/26/15 [phone]

## 2015-01-26 NOTE — ED Notes (Signed)
Changed foley cath drainage bag to a leg bag. Informed patient and pt's spouse about how to care for catheter. Pt and patients spouse verbalized understanding.

## 2015-01-26 NOTE — ED Notes (Signed)
Pt states he has not been able to empty bladder since 2200 last night. Pt has hx of prostate CA.

## 2015-01-26 NOTE — ED Notes (Signed)
Bladder scanned >168mL. Pt tolerated it fair.

## 2015-01-27 ENCOUNTER — Ambulatory Visit (INDEPENDENT_AMBULATORY_CARE_PROVIDER_SITE_OTHER): Payer: Medicare Other | Admitting: Urology

## 2015-01-27 DIAGNOSIS — N3091 Cystitis, unspecified with hematuria: Secondary | ICD-10-CM | POA: Diagnosis not present

## 2015-01-27 DIAGNOSIS — R310 Gross hematuria: Secondary | ICD-10-CM | POA: Diagnosis not present

## 2015-01-27 DIAGNOSIS — Z8546 Personal history of malignant neoplasm of prostate: Secondary | ICD-10-CM

## 2015-01-27 LAB — URINE CULTURE
COLONY COUNT: NO GROWTH
CULTURE: NO GROWTH

## 2015-02-03 ENCOUNTER — Ambulatory Visit (INDEPENDENT_AMBULATORY_CARE_PROVIDER_SITE_OTHER): Payer: Medicare Other | Admitting: Urology

## 2015-02-03 DIAGNOSIS — N3091 Cystitis, unspecified with hematuria: Secondary | ICD-10-CM

## 2015-02-03 DIAGNOSIS — R310 Gross hematuria: Secondary | ICD-10-CM | POA: Diagnosis not present

## 2015-02-03 DIAGNOSIS — Z8546 Personal history of malignant neoplasm of prostate: Secondary | ICD-10-CM | POA: Diagnosis not present

## 2015-02-03 DIAGNOSIS — R972 Elevated prostate specific antigen [PSA]: Secondary | ICD-10-CM | POA: Diagnosis not present

## 2015-02-03 DIAGNOSIS — N390 Urinary tract infection, site not specified: Secondary | ICD-10-CM | POA: Diagnosis not present

## 2015-03-04 ENCOUNTER — Telehealth: Payer: Self-pay | Admitting: Cardiology

## 2015-03-05 NOTE — Telephone Encounter (Signed)
Close encounter 

## 2015-03-18 DIAGNOSIS — H5203 Hypermetropia, bilateral: Secondary | ICD-10-CM | POA: Diagnosis not present

## 2015-03-18 DIAGNOSIS — H2510 Age-related nuclear cataract, unspecified eye: Secondary | ICD-10-CM | POA: Diagnosis not present

## 2015-03-18 DIAGNOSIS — H524 Presbyopia: Secondary | ICD-10-CM | POA: Diagnosis not present

## 2015-03-18 DIAGNOSIS — H52223 Regular astigmatism, bilateral: Secondary | ICD-10-CM | POA: Diagnosis not present

## 2015-04-15 ENCOUNTER — Encounter: Payer: Self-pay | Admitting: Cardiology

## 2015-04-24 DIAGNOSIS — N3091 Cystitis, unspecified with hematuria: Secondary | ICD-10-CM | POA: Diagnosis not present

## 2015-04-24 DIAGNOSIS — N402 Nodular prostate without lower urinary tract symptoms: Secondary | ICD-10-CM | POA: Diagnosis not present

## 2015-04-24 DIAGNOSIS — Z8546 Personal history of malignant neoplasm of prostate: Secondary | ICD-10-CM | POA: Diagnosis not present

## 2015-04-24 DIAGNOSIS — R310 Gross hematuria: Secondary | ICD-10-CM | POA: Diagnosis not present

## 2015-04-30 ENCOUNTER — Encounter: Payer: Self-pay | Admitting: Cardiology

## 2015-04-30 ENCOUNTER — Ambulatory Visit (INDEPENDENT_AMBULATORY_CARE_PROVIDER_SITE_OTHER): Payer: Medicare Other | Admitting: Cardiology

## 2015-04-30 VITALS — BP 130/62 | HR 86 | Ht 66.0 in | Wt 206.8 lb

## 2015-04-30 DIAGNOSIS — I2510 Atherosclerotic heart disease of native coronary artery without angina pectoris: Secondary | ICD-10-CM

## 2015-04-30 DIAGNOSIS — Z95828 Presence of other vascular implants and grafts: Secondary | ICD-10-CM

## 2015-04-30 DIAGNOSIS — E7800 Pure hypercholesterolemia, unspecified: Secondary | ICD-10-CM

## 2015-04-30 DIAGNOSIS — I2699 Other pulmonary embolism without acute cor pulmonale: Secondary | ICD-10-CM | POA: Diagnosis not present

## 2015-04-30 DIAGNOSIS — Z9889 Other specified postprocedural states: Secondary | ICD-10-CM | POA: Diagnosis not present

## 2015-04-30 MED ORDER — ATORVASTATIN CALCIUM 40 MG PO TABS: 40.0000 mg | ORAL_TABLET | Freq: Every morning | ORAL | Status: AC

## 2015-04-30 NOTE — Progress Notes (Signed)
Daniel Vega Date of Birth: Nov 17, 1940   History of Present Illness: Daniel Vega is seen for followup CAD. He was last seen in 2012. He has a history of CAD. He reports an MI in 1994.  In 2012 he had an abnormal Stress Echo. This led to a cardiac cath that showed diffuse dissection of the RCA with occlusion distally. There was good collateral flow. The left coronary showed diffuse calcific nonobstructive disease and he was treated medically. He has a history of HTN and HL. He has remote history of lung CA and is s/p left lobectomy in 1999 by Dr. Cyndia Bent. He has a history of prostate CA and is s/p radioactive seed implants in 2009. In September 2015 he presented with an acute PE and DVT. He was anticoagulated with Xarelto. 2 days later he developed gross hematuria with clots and urinary retention. An IVC filter was placed and Xarelto was stopped. He required several urologic procedures including ureteral stent placement.  Since then he has done very well. He denies any symptoms of chest pain or SOB. No bleeding. No edema or palpitations. No complaints today.  Current Outpatient Prescriptions on File Prior to Visit  Medication Sig Dispense Refill  . allopurinol (ZYLOPRIM) 100 MG tablet Take 100 mg by mouth Daily.    Marland Kitchen ALPRAZolam (XANAX) 0.5 MG tablet Take 0.5 mg by mouth 3 (three) times daily as needed for anxiety (anxiety).     Marland Kitchen aspirin EC 81 MG tablet Take 81 mg by mouth every other day.    . Cyanocobalamin (VITAMIN B 12 PO) Take 1,000 mcg by mouth daily.      Marland Kitchen docusate sodium (COLACE) 100 MG capsule Take 100 mg by mouth daily.    . finasteride (PROSCAR) 5 MG tablet Take 5 mg by mouth daily.    Marland Kitchen glucosamine-chondroitin 500-400 MG tablet Take 1 tablet by mouth 2 (two) times daily.     Marland Kitchen HYDROcodone-acetaminophen (NORCO/VICODIN) 5-325 MG per tablet Take 1-2 tablets by mouth every 4 (four) hours as needed for moderate pain. 30 tablet 0  . nitroGLYCERIN (NITROSTAT) 0.4 MG SL tablet Place 0.4 mg  under the tongue every 5 (five) minutes as needed. For chest pain    . traMADol (ULTRAM) 50 MG tablet Take 1 tablet (50 mg total) by mouth every 6 (six) hours as needed for moderate pain (if response to Tylenol insufficient). 15 tablet 0  . cephALEXin (KEFLEX) 500 MG capsule Take 1 capsule (500 mg total) by mouth every 12 (twelve) hours. (Patient not taking: Reported on 01/26/2015) 6 capsule 0   No current facility-administered medications on file prior to visit.    Allergies  Allergen Reactions  . Codeine Palpitations    Made heart hurt  . Ibuprofen Palpitations    Past Medical History  Diagnosis Date  . Hypertension   . Hypercholesteremia   . Glucose intolerance (impaired glucose tolerance)   . Kidney stones, calcium oxalate   . CAD (coronary artery disease)   . 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 Adenomatous colon polyp     Dr. Amedeo Plenty  . Prostate cancer 2009    Dr. Eulogio Ditch; tx w/ radium implants  . Lung cancer   . Pulmonary embolus   . Hematuria   . S/P IVC filter     Past Surgical History  Procedure Laterality Date  . Lobectomy  1999    left - Dr. Cyndia Bent  . Repair right testicular torsion  1962  .  Cardiac catheterization  1997    Dr. Martinique  . Prostate radium implants  2009  . Lung cancer surgery  2003  . Colonoscopy  ;11/12/99;04/29/03    12/27/09  . Lumbar laminectomy/decompression microdiscectomy  08/10/2012    Procedure: LUMBAR LAMINECTOMY/DECOMPRESSION MICRODISCECTOMY 1 LEVEL;  Surgeon: Winfield Cunas, MD;  Location: [facility];  Service: Neurosurgery;  Laterality: Left;  LEFT Lumbar five-sacral one microdiskectomy  . Cystoscopy with retrograde pyelogram, ureteroscopy and stent placement Right 07/31/2014    Procedure: CYSTOSCOPY WITH RETROGRADE PYELOGRAM, URETEROSCOPY WITH FULGURATION AND STENT PLACEMENT;  Surgeon: [name], MD;  Location: [facility];  Service: Urology;  Laterality: Right;    History  Smoking status  . Former Smoker --  2.00 packs/day for 35 years  . Types: Cigarettes  . Quit date: 10/17/1982  Smokeless tobacco  . Former Systems developer  . Types: Chew  . Quit date: 03/15/2012    History  Alcohol Use  . 7.0 oz/week  . 14 drink(s) per week    Comment: 2 drinks a day before supper but has not had in 1 month    Family History  Problem Relation Age of Onset  . Heart disease Mother   . Hypertension Mother   . Heart disease Father   . Hypertension Father   . Stroke Father   . Other Brother     spinal meningitis  . Other Brother     MVA  . Cancer Sister     stomach    Review of Systems: The review of systems is positive for occasional dizziness. He reports that his cholesterol levels have been good.  All other systems were reviewed and are negative.  Physical Exam: BP 130/62 mmHg  Pulse 86  Ht '5\' 6"'$  (1.676 m)  Wt 93.804 kg (206 lb 12.8 oz)  BMI 33.39 kg/m2 The patient is alert and oriented x 3.  The mood and affect are normal.  The skin is warm and dry.  Color is normal.  The HEENT exam reveals that the sclera are nonicteric.  The mucous membranes are moist.  The carotids are 2+ without bruits.  There is no thyromegaly.  There is no JVD.  The lungs are clear.  The chest wall is non tender.  The heart exam reveals a regular rate with a normal S1 and S2.  There are no murmurs, gallops, or rubs.  The PMI is not displaced.   Abdominal exam reveals good bowel sounds.  There is no guarding or rebound.  There is no hepatosplenomegaly or tenderness.  There are no masses.  Exam of the legs reveal no clubbing, cyanosis, or edema.  The legs are without rashes.  The distal pulses are intact.  Cranial nerves II - XII are intact.  Motor and sensory functions are intact.  The gait is normal.  LABORATORY DATA:  Ecg October 2015 was normal. I have personally reviewed and interpreted this study.  Echo 07/15/14:Study Conclusions  - Procedure narrative: Transthoracic echocardiography. Image quality was adequate. The  study was technically difficult, as a result of poor sound wave transmission. - Left ventricle: The cavity size was normal. There was severe concentric hypertrophy. Systolic function was normal. The estimated ejection fraction was in the range of 60% to 65%. Wall motion was normal; there were no regional wall motion abnormalities. Doppler parameters are consistent with abnormal left ventricular relaxation (grade 1 diastolic dysfunction). The E/e&' ratio is between 8-15, suggesting indeterminate LV filling pressure. - Left atrium: The atrium was  normal in size. - Tricuspid valve: There was mild regurgitation. - Pulmonary arteries: PA peak pressure: 34 mm Hg (S). - Systemic veins: Poorly visualized.  Impressions:  - LVEF 60-65%, severe concentric LVH, normal wall motion, diastolic dysfunction, indeterminate LV filling pressure, borderline pulmonary hypertension.  Assessment / Plan: 1. CAD with remote MI. Documented occlusion of the distal RCA with collaterals. Diffuse calcific disease in the LCA. He is asymptomatic. Continue ASA and statin therapy. Follow up stress testing would not be helpful since this was abnormal in the past.  2. History of PE. S/p IVC filter due to bleeding complications on Xarelto. 3. HTN controlled on no meds. 4. Hyperlipidemia. He is supposed to be on lipitor 40 mg but it is not on his list. He thinks he is taking it. Will send in Rx to make sure.   Overall he is doing very well from a cardiac standpoint. I will follow up in one year unless he has new problems.

## 2015-04-30 NOTE — Patient Instructions (Signed)
Continue your current therapy- make sure you are taking lipitor 40 mg daily  I will follow up in 1 year

## 2015-07-03 DIAGNOSIS — Z23 Encounter for immunization: Secondary | ICD-10-CM | POA: Diagnosis not present

## 2015-07-22 ENCOUNTER — Other Ambulatory Visit: Payer: Self-pay | Admitting: Gastroenterology

## 2015-07-22 DIAGNOSIS — Z86010 Personal history of colonic polyps: Secondary | ICD-10-CM | POA: Diagnosis not present

## 2015-07-22 DIAGNOSIS — D127 Benign neoplasm of rectosigmoid junction: Secondary | ICD-10-CM | POA: Diagnosis not present

## 2015-07-22 DIAGNOSIS — D123 Benign neoplasm of transverse colon: Secondary | ICD-10-CM | POA: Diagnosis not present

## 2015-07-22 DIAGNOSIS — Z09 Encounter for follow-up examination after completed treatment for conditions other than malignant neoplasm: Secondary | ICD-10-CM | POA: Diagnosis not present

## 2015-07-22 DIAGNOSIS — D126 Benign neoplasm of colon, unspecified: Secondary | ICD-10-CM | POA: Diagnosis not present

## 2015-07-22 DIAGNOSIS — D128 Benign neoplasm of rectum: Secondary | ICD-10-CM | POA: Diagnosis not present

## 2015-08-17 NOTE — Patient Outreach (Signed)
[facility]) Care Management  08/17/2015  Daniel Vega [birth year] [id number]   Referral from [place], assigned Sherrin Daisy, RN to outreach for [place] Management services.  Thanks, [name]. [place], [place] Assistant Phone: [phone] Fax: [phone]

## 2015-08-20 ENCOUNTER — Other Ambulatory Visit: Payer: Self-pay | Admitting: *Deleted

## 2015-08-20 NOTE — Patient Outreach (Signed)
[facility]) Care Management  08/20/2015  Daniel Vega July 13, 1941 [id number]   Referral from [facility]: Telephone call to patient; left message on voice mail requesting return call.  Plan: Will follow up. Sherrin Daisy, RN BSN [place] Management Coordinator [facility] Care Management  [phone]

## 2015-08-21 ENCOUNTER — Encounter: Payer: Self-pay | Admitting: *Deleted

## 2015-08-21 ENCOUNTER — Other Ambulatory Visit: Payer: Self-pay | Admitting: *Deleted

## 2015-08-21 NOTE — Patient Outreach (Signed)
[facility]) Care Management  08/21/2015  ROALD LUKACS 02-02-1941 [id number]   Received incoming call from patient. Patient advised of reason for call and of [facility] care management services.  Patient voices that does not have any health care concerns at this times. States no problems getting medications.Using local pharmacy and getting 90 day supply. States managing own medications and is taking medications and understands importance of taking medications as prescribed.  Seeing primary care doctor as scheduled and no transportation problems. States up to date on immunizations and had colonoscopy several weeks ago which was fine. Patient declined [facility] care management services.  Plan: Will close case and send MD closure letter. Sherrin Daisy, RN BSN [place] Management Coordinator [facility] Care Management  [phone]

## 2015-08-26 NOTE — Patient Outreach (Signed)
[facility]) Care Management  08/26/2015  Daniel Vega 28-Apr-1941 [id number]   Notification from Sherrin Daisy, RN to close case due to patient refused [place] Management services.  Thanks, [name]. [place], [place] Assistant Phone: [phone] Fax: [phone]

## 2015-10-06 DIAGNOSIS — H5203 Hypermetropia, bilateral: Secondary | ICD-10-CM | POA: Diagnosis not present

## 2015-10-06 DIAGNOSIS — H52223 Regular astigmatism, bilateral: Secondary | ICD-10-CM | POA: Diagnosis not present

## 2015-10-06 DIAGNOSIS — H2513 Age-related nuclear cataract, bilateral: Secondary | ICD-10-CM | POA: Diagnosis not present

## 2015-10-06 DIAGNOSIS — H524 Presbyopia: Secondary | ICD-10-CM | POA: Diagnosis not present

## 2015-11-23 DIAGNOSIS — H2512 Age-related nuclear cataract, left eye: Secondary | ICD-10-CM | POA: Diagnosis not present

## 2015-11-23 DIAGNOSIS — H2513 Age-related nuclear cataract, bilateral: Secondary | ICD-10-CM | POA: Diagnosis not present

## 2015-12-07 NOTE — Patient Instructions (Signed)
Daniel Vega  12/07/2015     '@PREFPERIOPPHARMACY'$ @   Your procedure is scheduled on 12/10/2015.  Report to [facility] at 8:10 A.M.  Call this number if you have problems the morning of surgery:  [phone]   Remember:  Do not eat food or drink liquids after midnight.  Take these medicines the morning of surgery with A SIP OF WATER Allopurinol, Xanax, Hydrocodone OR Ultram   Do not wear jewelry, make-up or nail polish.  Do not wear lotions, powders, or perfumes.  You may wear deodorant.  Do not shave 48 hours prior to surgery.  Men may shave face and neck.  Do not bring valuables to the hospital.  [facility] is not responsible for any belongings or valuables.  Contacts, dentures or bridgework may not be worn into surgery.  Leave your suitcase in the car.  After surgery it may be brought to your room.  For patients admitted to the hospital, discharge time will be determined by your treatment team.  Patients discharged the day of surgery will not be allowed to drive home.    Please read over the following fact sheets that you were given. Anesthesia Post-op Instructions     PATIENT INSTRUCTIONS POST-ANESTHESIA  IMMEDIATELY FOLLOWING SURGERY:  Do not drive or operate machinery for the first twenty four hours after surgery.  Do not make any important decisions for twenty four hours after surgery or while taking narcotic pain medications or sedatives.  If you develop intractable nausea and vomiting or a severe headache please notify your doctor immediately.  FOLLOW-UP:  Please make an appointment with your surgeon as instructed. You do not need to follow up with anesthesia unless specifically instructed to do so.  WOUND CARE INSTRUCTIONS (if applicable):  Keep a dry clean dressing on the anesthesia/puncture wound site if there is drainage.  Once the wound has quit draining you may leave it open to air.  Generally you should leave the bandage intact for twenty four hours unless  there is drainage.  If the epidural site drains for more than 36-48 hours please call the anesthesia department.  QUESTIONS?:  Please feel free to call your physician or the hospital operator if you have any questions, and they will be happy to assist you.       A cataract is a clouding of the lens of the eye. When a lens becomes cloudy, vision is reduced based on the degree and nature of the clouding. Surgery may be needed to improve vision. Surgery removes the cloudy lens and usually replaces it with a substitute lens (intraocular lens, IOL). LET YOUR EYE DOCTOR KNOW ABOUT:  Allergies to food or medicine.  Medicines taken including herbs, eye drops, over-the-counter medicines, and creams.  Use of steroids (by mouth or creams).  Previous problems with anesthetics or numbing medicine.  History of bleeding problems or blood clots.  Previous surgery.  Other health problems, including diabetes and kidney problems.  Possibility of pregnancy, if this applies. RISKS AND COMPLICATIONS  Infection.  Inflammation of the eyeball (endophthalmitis) that can spread to both eyes (sympathetic ophthalmia).  Poor wound healing.  If an IOL is inserted, it can later fall out of proper position. This is very uncommon.  Clouding of the part of your eye that holds an IOL in place. This is called an "after-cataract." These are uncommon but easily treated. BEFORE THE PROCEDURE  Do not eat or drink anything except small amounts of water for 8 to 12  before your surgery, or as directed by your caregiver.  Unless you are told otherwise, continue any eye drops you have been prescribed.  Talk to your primary caregiver about all other medicines that you take (both prescription and nonprescription). In some cases, you may need to stop or change medicines near the time of your surgery. This is most important if you are taking blood-thinning medicine.Do not stop medicines unless you are told to do  so.  Arrange for someone to drive you to and from the procedure.  Do not put contact lenses in either eye on the day of your surgery. PROCEDURE There is more than one method for safely removing a cataract. Your doctor can explain the differences and help determine which is best for you. Phacoemulsification surgery is the most common form of cataract surgery.  An injection is given behind the eye or eye drops are given to make this a painless procedure.  A small cut (incision) is made on the edge of the clear, dome-shaped surface that covers the front of the eye (cornea).  A tiny probe is painlessly inserted into the eye. This device gives off ultrasound waves that soften and break up the cloudy center of the lens. This makes it easier for the cloudy lens to be removed by suction.  An IOL may be implanted.  The normal lens of the eye is covered by a clear capsule. Part of that capsule is intentionally left in the eye to support the IOL.  Your surgeon may or may not use stitches to close the incision. There are other forms of cataract surgery that require a larger incision and stitches to close the eye. This approach is taken in cases where the doctor feels that the cataract cannot be easily removed using phacoemulsification. AFTER THE PROCEDURE  When an IOL is implanted, it does not need care. It becomes a permanent part of your eye and cannot be seen or felt.  Your doctor will schedule follow-up exams to check on your progress.  Review your other medicines with your doctor to see which can be resumed after surgery.  Use eye drops or take medicine as prescribed by your doctor.   This information is not intended to replace advice given to you by your health care provider. Make sure you discuss any questions you have with your health care provider.   Document Released: 09/22/2011 Document Revised: 10/24/2014 Document Reviewed: 09/22/2011 Elsevier Interactive Patient Education [employer]

## 2015-12-08 ENCOUNTER — Other Ambulatory Visit: Payer: Self-pay

## 2015-12-08 ENCOUNTER — Encounter (HOSPITAL_COMMUNITY): Payer: Self-pay

## 2015-12-08 ENCOUNTER — Encounter (HOSPITAL_COMMUNITY)
Admission: RE | Admit: 2015-12-08 | Discharge: 2015-12-08 | Disposition: A | Discharge status: Home / Self Care | Payer: Medicare Other | Source: Ambulatory Visit | Attending: Ophthalmology | Admitting: Ophthalmology

## 2015-12-08 DIAGNOSIS — Z87891 Personal history of nicotine dependence: Secondary | ICD-10-CM | POA: Diagnosis not present

## 2015-12-08 DIAGNOSIS — Z01810 Encounter for preprocedural cardiovascular examination: Secondary | ICD-10-CM | POA: Diagnosis not present

## 2015-12-08 DIAGNOSIS — Z7982 Long term (current) use of aspirin: Secondary | ICD-10-CM | POA: Diagnosis not present

## 2015-12-08 DIAGNOSIS — Z85118 Personal history of other malignant neoplasm of bronchus and lung: Secondary | ICD-10-CM | POA: Diagnosis not present

## 2015-12-08 DIAGNOSIS — I10 Essential (primary) hypertension: Secondary | ICD-10-CM | POA: Diagnosis not present

## 2015-12-08 DIAGNOSIS — H2512 Age-related nuclear cataract, left eye: Secondary | ICD-10-CM | POA: Diagnosis not present

## 2015-12-08 DIAGNOSIS — Z7901 Long term (current) use of anticoagulants: Secondary | ICD-10-CM | POA: Diagnosis not present

## 2015-12-08 DIAGNOSIS — Z01812 Encounter for preprocedural laboratory examination: Secondary | ICD-10-CM | POA: Diagnosis not present

## 2015-12-08 DIAGNOSIS — M1991 Primary osteoarthritis, unspecified site: Secondary | ICD-10-CM | POA: Diagnosis not present

## 2015-12-08 DIAGNOSIS — Z86711 Personal history of pulmonary embolism: Secondary | ICD-10-CM | POA: Diagnosis not present

## 2015-12-08 DIAGNOSIS — Z8546 Personal history of malignant neoplasm of prostate: Secondary | ICD-10-CM | POA: Diagnosis not present

## 2015-12-08 DIAGNOSIS — I2510 Atherosclerotic heart disease of native coronary artery without angina pectoris: Secondary | ICD-10-CM | POA: Diagnosis not present

## 2015-12-08 DIAGNOSIS — Z79899 Other long term (current) drug therapy: Secondary | ICD-10-CM | POA: Diagnosis not present

## 2015-12-08 LAB — BASIC METABOLIC PANEL
ANION GAP: 6 (ref 5–15)
BUN: 14 mg/dL (ref 6–20)
CALCIUM: 9.1 mg/dL (ref 8.9–10.3)
CO2: 26 mmol/L (ref 22–32)
CREATININE: 0.94 mg/dL (ref 0.61–1.24)
Chloride: 107 mmol/L (ref 101–111)
GFR calc non Af Amer: >60 mL/min (ref >60)
GLUCOSE: 153 mg/dL — AB (ref 65–99)
POTASSIUM: 4 mmol/L (ref 3.5–5.1)
Sodium: 139 mmol/L (ref 135–145)

## 2015-12-08 LAB — CBC
HCT: 43.3 % (ref 39.0–52.0)
HEMOGLOBIN: 14.8 g/dL (ref 13.0–17.0)
MCH: 33.1 pg (ref 26.0–34.0)
MCHC: 34.2 g/dL (ref 30.0–36.0)
MCV: 96.9 fL (ref 78.0–100.0)
Platelets: 142 10*3/uL — ABNORMAL LOW (ref 150–400)
RBC: 4.47 MIL/uL (ref 4.22–5.81)
RDW: 13 % (ref 11.5–15.5)
WBC: 6.5 10*3/uL (ref 4.0–10.5)

## 2015-12-08 NOTE — Pre-Procedure Instructions (Signed)
Patient given information to sign up for my chart at home. 

## 2015-12-09 MED ORDER — PHENYLEPHRINE HCL 2.5 % OP SOLN
OPHTHALMIC | Status: AC
  Filled 2015-12-09: qty 15

## 2015-12-09 MED ORDER — TETRACAINE HCL 0.5 % OP SOLN
OPHTHALMIC | Status: AC
Start: 2015-12-09 — End: 2015-12-09
  Filled 2015-12-09: qty 4

## 2015-12-09 MED ORDER — LIDOCAINE HCL 3.5 % OP GEL
OPHTHALMIC | Status: AC
  Filled 2015-12-09: qty 1

## 2015-12-09 MED ORDER — CYCLOPENTOLATE-PHENYLEPHRINE OP SOLN OPTIME - NO CHARGE
OPHTHALMIC | Status: AC
  Filled 2015-12-09: qty 2

## 2015-12-09 MED ORDER — LIDOCAINE HCL (PF) 1 % IJ SOLN
INTRAMUSCULAR | Status: AC
  Filled 2015-12-09: qty 2

## 2015-12-09 MED ORDER — NEOMYCIN-POLYMYXIN-DEXAMETH 3.5-10000-0.1 OP SUSP
OPHTHALMIC | Status: AC
  Filled 2015-12-09: qty 5

## 2015-12-10 ENCOUNTER — Encounter (HOSPITAL_COMMUNITY)
Admission: RE | Disposition: A | Discharge status: Home / Self Care | Payer: Self-pay | Source: Ambulatory Visit | Attending: Ophthalmology

## 2015-12-10 ENCOUNTER — Ambulatory Visit (HOSPITAL_COMMUNITY): Payer: Medicare Other | Admitting: Anesthesiology

## 2015-12-10 ENCOUNTER — Ambulatory Visit (HOSPITAL_COMMUNITY)
Admission: RE | Admit: 2015-12-10 | Discharge: 2015-12-10 | Disposition: A | Discharge status: Home / Self Care | Payer: Medicare Other | Source: Ambulatory Visit | Attending: Ophthalmology | Admitting: Ophthalmology

## 2015-12-10 ENCOUNTER — Encounter (HOSPITAL_COMMUNITY): Payer: Self-pay | Admitting: *Deleted

## 2015-12-10 DIAGNOSIS — Z8546 Personal history of malignant neoplasm of prostate: Secondary | ICD-10-CM | POA: Diagnosis not present

## 2015-12-10 DIAGNOSIS — I2510 Atherosclerotic heart disease of native coronary artery without angina pectoris: Secondary | ICD-10-CM | POA: Insufficient documentation

## 2015-12-10 DIAGNOSIS — H2512 Age-related nuclear cataract, left eye: Secondary | ICD-10-CM | POA: Diagnosis not present

## 2015-12-10 DIAGNOSIS — Z01810 Encounter for preprocedural cardiovascular examination: Secondary | ICD-10-CM | POA: Diagnosis not present

## 2015-12-10 DIAGNOSIS — Z85118 Personal history of other malignant neoplasm of bronchus and lung: Secondary | ICD-10-CM | POA: Insufficient documentation

## 2015-12-10 DIAGNOSIS — Z86711 Personal history of pulmonary embolism: Secondary | ICD-10-CM | POA: Insufficient documentation

## 2015-12-10 DIAGNOSIS — I10 Essential (primary) hypertension: Secondary | ICD-10-CM | POA: Insufficient documentation

## 2015-12-10 DIAGNOSIS — Z7982 Long term (current) use of aspirin: Secondary | ICD-10-CM | POA: Insufficient documentation

## 2015-12-10 DIAGNOSIS — Z7901 Long term (current) use of anticoagulants: Secondary | ICD-10-CM | POA: Insufficient documentation

## 2015-12-10 DIAGNOSIS — M1991 Primary osteoarthritis, unspecified site: Secondary | ICD-10-CM | POA: Insufficient documentation

## 2015-12-10 DIAGNOSIS — Z01812 Encounter for preprocedural laboratory examination: Secondary | ICD-10-CM | POA: Diagnosis not present

## 2015-12-10 DIAGNOSIS — Z79899 Other long term (current) drug therapy: Secondary | ICD-10-CM | POA: Insufficient documentation

## 2015-12-10 DIAGNOSIS — Z87891 Personal history of nicotine dependence: Secondary | ICD-10-CM | POA: Insufficient documentation

## 2015-12-10 DIAGNOSIS — H269 Unspecified cataract: Secondary | ICD-10-CM | POA: Diagnosis not present

## 2015-12-10 HISTORY — PX: CATARACT EXTRACTION W/PHACO: SHX586

## 2015-12-10 SURGERY — PHACOEMULSIFICATION, CATARACT, WITH IOL INSERTION
Anesthesia: Monitor Anesthesia Care | Site: Eye | Laterality: Left

## 2015-12-10 MED ORDER — LIDOCAINE HCL 3.5 % OP GEL
1.0000 "application " | OPHTHALMIC | Status: AC
  Administered 2015-12-10: 1 via OPHTHALMIC

## 2015-12-10 MED ORDER — FENTANYL CITRATE (PF) 100 MCG/2ML IJ SOLN
INTRAMUSCULAR | Status: AC
  Filled 2015-12-10: qty 2

## 2015-12-10 MED ORDER — FENTANYL CITRATE (PF) 100 MCG/2ML IJ SOLN: 25.0000 ug | INTRAMUSCULAR | Status: DC | PRN

## 2015-12-10 MED ORDER — EPINEPHRINE HCL 1 MG/ML IJ SOLN
INTRAMUSCULAR | Status: AC
  Filled 2015-12-10: qty 1

## 2015-12-10 MED ORDER — FENTANYL CITRATE (PF) 100 MCG/2ML IJ SOLN
25.0000 ug | INTRAMUSCULAR | Status: AC
  Administered 2015-12-10 (×2): 25 ug via INTRAVENOUS

## 2015-12-10 MED ORDER — PHENYLEPHRINE HCL 2.5 % OP SOLN
1.0000 [drp] | OPHTHALMIC | Status: AC
  Administered 2015-12-10 (×3): 1 [drp] via OPHTHALMIC

## 2015-12-10 MED ORDER — MIDAZOLAM HCL 2 MG/2ML IJ SOLN
1.0000 mg | INTRAMUSCULAR | Status: DC | PRN
  Administered 2015-12-10: 2 mg via INTRAVENOUS

## 2015-12-10 MED ORDER — LIDOCAINE HCL (PF) 1 % IJ SOLN
INTRAMUSCULAR | Status: DC | PRN
  Administered 2015-12-10: .4 mL

## 2015-12-10 MED ORDER — MIDAZOLAM HCL 2 MG/2ML IJ SOLN
INTRAMUSCULAR | Status: AC
  Filled 2015-12-10: qty 2

## 2015-12-10 MED ORDER — LACTATED RINGERS IV SOLN
INTRAVENOUS | Status: DC
  Administered 2015-12-10: 1000 mL via INTRAVENOUS

## 2015-12-10 MED ORDER — BSS IO SOLN
INTRAOCULAR | Status: DC | PRN
  Administered 2015-12-10: 15 mL

## 2015-12-10 MED ORDER — TETRACAINE HCL 0.5 % OP SOLN
1.0000 [drp] | OPHTHALMIC | Status: AC
  Administered 2015-12-10 (×3): 1 [drp] via OPHTHALMIC

## 2015-12-10 MED ORDER — NEOMYCIN-POLYMYXIN-DEXAMETH 3.5-10000-0.1 OP SUSP
OPHTHALMIC | Status: DC | PRN
  Administered 2015-12-10: 2 [drp] via OPHTHALMIC

## 2015-12-10 MED ORDER — CYCLOPENTOLATE-PHENYLEPHRINE 0.2-1 % OP SOLN
1.0000 [drp] | OPHTHALMIC | Status: AC
  Administered 2015-12-10 (×3): 1 [drp] via OPHTHALMIC

## 2015-12-10 MED ORDER — POVIDONE-IODINE 5 % OP SOLN
OPHTHALMIC | Status: DC | PRN
  Administered 2015-12-10: 1 via OPHTHALMIC

## 2015-12-10 MED ORDER — PROVISC 10 MG/ML IO SOLN
INTRAOCULAR | Status: DC | PRN
  Administered 2015-12-10: 0.85 mL via INTRAOCULAR

## 2015-12-10 MED ORDER — EPINEPHRINE HCL 1 MG/ML IJ SOLN
INTRAMUSCULAR | Status: DC | PRN
  Administered 2015-12-10: 500 mL

## 2015-12-10 MED ORDER — ONDANSETRON HCL 4 MG/2ML IJ SOLN: 4.0000 mg | Freq: Once | INTRAMUSCULAR | Status: DC | PRN

## 2015-12-10 SURGICAL SUPPLY — 12 items

## 2015-12-10 NOTE — Anesthesia Postprocedure Evaluation (Signed)
Anesthesia Post Note  Patient: Daniel Vega  Procedure(s) Performed: Procedure(s) (LRB): CATARACT EXTRACTION PHACO AND INTRAOCULAR LENS PLACEMENT LEFT EYE CDE=10.98 (Left)  Patient location during evaluation: Short Stay Anesthesia Type: MAC Level of consciousness: awake and alert Pain management: pain level controlled Vital Signs Assessment: post-procedure vital signs reviewed and stable Respiratory status: spontaneous breathing Cardiovascular status: blood pressure returned to baseline Postop Assessment: no signs of nausea or vomiting Anesthetic complications: no    Last Vitals:  Filed Vitals:   12/10/15 0820 12/10/15 0825  BP: 118/65 113/64  Pulse:    Temp:    Resp: 16 19    Last Pain: There were no vitals filed for this visit.               Khadijah Mastrianni

## 2015-12-10 NOTE — H&P (Signed)
I have reviewed the H&P, the patient was re-examined, and I have identified no interval changes in medical condition and plan of care since the history and physical of record  

## 2015-12-10 NOTE — Op Note (Signed)
Date of Admission: 12/10/2015  Date of Surgery: 12/10/2015   Pre-Op Dx: Cataract Left Eye  Post-Op Dx: Senile Nuclear Cataract Left  Eye,  Dx Code H25.12  Surgeon: Tonny Branch, M.D.  Assistants: None  Anesthesia: Topical with MAC  Indications: Painless, progressive loss of vision with compromise of daily activities.  Surgery: Cataract Extraction with Intraocular lens Implant Left Eye  Discription: The patient had dilating drops and viscous lidocaine placed into the Left eye in the pre-op holding area. After transfer to the operating room, a time out was performed. The patient was then prepped and draped. Beginning with a 3 degree blade a paracentesis port was made at the surgeon's 2 o'clock position. The anterior chamber was then filled with 1% non-preserved lidocaine. This was followed by filling the anterior chamber with Provisc.  A 2.22m keratome blade was used to make a clear corneal incision at the temporal limbus.  A bent cystatome needle was used to create a continuous tear capsulotomy. Hydrodissection was performed with balanced salt solution on a Fine canula. The lens nucleus was then removed using the phacoemulsification handpiece. Residual cortex was removed with the I&A handpiece. The anterior chamber and capsular bag were refilled with Provisc. A posterior chamber intraocular lens was placed into the capsular bag with it's injector. The implant was positioned with the Kuglan hook. The Provisc was then removed from the anterior chamber and capsular bag with the I&A handpiece. Stromal hydration of the main incision and paracentesis port was performed with BSS on a Fine canula. The wounds were tested for leak which was negative. The patient tolerated the procedure well. There were no operative complications. The patient was then transferred to the recovery room in stable condition.  Complications: None  Specimen: None  EBL: None  Prosthetic device: Hoya iSert 250, power 20.5 D, SN  [id number]

## 2015-12-10 NOTE — Transfer of Care (Signed)
Immediate Anesthesia Transfer of Care Note  Patient: Daniel Vega  Procedure(s) Performed: Procedure(s): CATARACT EXTRACTION PHACO AND INTRAOCULAR LENS PLACEMENT LEFT EYE CDE=10.98 (Left)  Patient Location: Short Stay  Anesthesia Type:MAC  Level of Consciousness: awake  Airway & Oxygen Therapy: Patient Spontanous Breathing  Post-op Assessment: Report given to RN  Post vital signs: Reviewed  Last Vitals:  Filed Vitals:   12/10/15 0820 12/10/15 0825  BP: 118/65 113/64  Pulse:    Temp:    Resp: 16 19    Complications: No apparent anesthesia complications

## 2015-12-10 NOTE — Discharge Instructions (Signed)

## 2015-12-10 NOTE — Anesthesia Preprocedure Evaluation (Signed)
Anesthesia Evaluation  Patient identified by MRN, date of birth, ID band Patient awake    Reviewed: Allergy & Precautions, H&P , NPO status , Patient's Chart, lab work & pertinent test results  History of Anesthesia Complications Negative for: history of anesthetic complications  Airway Mallampati: II  TM Distance: >3 FB Neck ROM: Full    Dental  (+) Edentulous Upper, Edentulous Lower   Pulmonary former smoker, PE Hx of lung cancer s/p resection, hx of PE initially on xarelto but s/p IVC filter placement secondary to active bleed    Pulmonary exam normal breath sounds clear to auscultation       Cardiovascular hypertension, Pt. on medications + CAD and + Past MI  Normal cardiovascular exam Rhythm:Regular Rate:Normal   EF 60%   Neuro/Psych negative neurological ROS  negative psych ROS   GI/Hepatic negative GI ROS, Neg liver ROS,   Endo/Other  negative endocrine ROS  Renal/GU Renal disease  negative genitourinary   Musculoskeletal  (+) Arthritis , Rheumatoid disorders and steroids,    Abdominal   Peds negative pediatric ROS (+)  Hematology  (+) anemia ,   Anesthesia Other Findings Hx of prostate cancer  Reproductive/Obstetrics negative OB ROS                             Anesthesia Physical Anesthesia Plan  ASA: III  Anesthesia Plan: MAC   Post-op Pain Management:    Induction: Intravenous  Airway Management Planned: Nasal Cannula  Additional Equipment:   Intra-op Plan:   Post-operative Plan:   Informed Consent: I have reviewed the patients History and Physical, chart, labs and discussed the procedure including the risks, benefits and alternatives for the proposed anesthesia with the patient or authorized representative who has indicated his/her understanding and acceptance.     Plan Discussed with:   Anesthesia Plan Comments:         Anesthesia Quick  Evaluation

## 2015-12-11 ENCOUNTER — Encounter (HOSPITAL_COMMUNITY): Payer: Self-pay | Admitting: Ophthalmology

## 2015-12-21 DIAGNOSIS — H2511 Age-related nuclear cataract, right eye: Secondary | ICD-10-CM | POA: Diagnosis not present

## 2015-12-21 DIAGNOSIS — Z961 Presence of intraocular lens: Secondary | ICD-10-CM | POA: Diagnosis not present

## 2016-01-04 ENCOUNTER — Encounter (HOSPITAL_COMMUNITY): Payer: Self-pay

## 2016-01-04 ENCOUNTER — Encounter (HOSPITAL_COMMUNITY)
Admission: RE | Admit: 2016-01-04 | Discharge: 2016-01-04 | Disposition: A | Discharge status: Home / Self Care | Payer: Medicare Other | Source: Ambulatory Visit | Attending: Ophthalmology | Admitting: Ophthalmology

## 2016-01-07 ENCOUNTER — Encounter (HOSPITAL_COMMUNITY)
Admission: RE | Disposition: A | Discharge status: Home / Self Care | Payer: Self-pay | Source: Ambulatory Visit | Attending: Ophthalmology

## 2016-01-07 ENCOUNTER — Ambulatory Visit (HOSPITAL_COMMUNITY)
Admission: RE | Admit: 2016-01-07 | Discharge: 2016-01-07 | Disposition: A | Discharge status: Home / Self Care | Payer: Medicare Other | Source: Ambulatory Visit | Attending: Ophthalmology | Admitting: Ophthalmology

## 2016-01-07 ENCOUNTER — Ambulatory Visit (HOSPITAL_COMMUNITY): Payer: Medicare Other | Admitting: Anesthesiology

## 2016-01-07 ENCOUNTER — Encounter (HOSPITAL_COMMUNITY): Payer: Self-pay | Admitting: Ophthalmology

## 2016-01-07 DIAGNOSIS — H2511 Age-related nuclear cataract, right eye: Secondary | ICD-10-CM | POA: Insufficient documentation

## 2016-01-07 DIAGNOSIS — M1991 Primary osteoarthritis, unspecified site: Secondary | ICD-10-CM | POA: Diagnosis not present

## 2016-01-07 DIAGNOSIS — I10 Essential (primary) hypertension: Secondary | ICD-10-CM | POA: Diagnosis not present

## 2016-01-07 DIAGNOSIS — Z8546 Personal history of malignant neoplasm of prostate: Secondary | ICD-10-CM | POA: Diagnosis not present

## 2016-01-07 DIAGNOSIS — H269 Unspecified cataract: Secondary | ICD-10-CM | POA: Diagnosis not present

## 2016-01-07 DIAGNOSIS — Z79899 Other long term (current) drug therapy: Secondary | ICD-10-CM | POA: Diagnosis not present

## 2016-01-07 DIAGNOSIS — Z86711 Personal history of pulmonary embolism: Secondary | ICD-10-CM | POA: Diagnosis not present

## 2016-01-07 DIAGNOSIS — Z7982 Long term (current) use of aspirin: Secondary | ICD-10-CM | POA: Diagnosis not present

## 2016-01-07 DIAGNOSIS — Z87891 Personal history of nicotine dependence: Secondary | ICD-10-CM | POA: Diagnosis not present

## 2016-01-07 HISTORY — PX: CATARACT EXTRACTION W/PHACO: SHX586

## 2016-01-07 LAB — GLUCOSE, CAPILLARY: GLUCOSE-CAPILLARY: 119 mg/dL — AB (ref 65–99)

## 2016-01-07 SURGERY — PHACOEMULSIFICATION, CATARACT, WITH IOL INSERTION
Anesthesia: Monitor Anesthesia Care | Site: Eye | Laterality: Right

## 2016-01-07 MED ORDER — MIDAZOLAM HCL 2 MG/2ML IJ SOLN
INTRAMUSCULAR | Status: AC
  Filled 2016-01-07: qty 2

## 2016-01-07 MED ORDER — KETOROLAC TROMETHAMINE 0.5 % OP SOLN: 1.0000 [drp] | OPHTHALMIC | Status: DC

## 2016-01-07 MED ORDER — FENTANYL CITRATE (PF) 100 MCG/2ML IJ SOLN
25.0000 ug | INTRAMUSCULAR | Status: AC
  Administered 2016-01-07 (×2): 25 ug via INTRAVENOUS

## 2016-01-07 MED ORDER — LIDOCAINE HCL 3.5 % OP GEL: 1.0000 "application " | Freq: Once | OPHTHALMIC | Status: DC

## 2016-01-07 MED ORDER — CYCLOPENTOLATE-PHENYLEPHRINE 0.2-1 % OP SOLN
1.0000 [drp] | OPHTHALMIC | Status: AC
  Administered 2016-01-07 (×3): 1 [drp] via OPHTHALMIC

## 2016-01-07 MED ORDER — NEOMYCIN-POLYMYXIN-DEXAMETH 3.5-10000-0.1 OP SUSP
OPHTHALMIC | Status: DC | PRN
  Administered 2016-01-07: 2 [drp] via OPHTHALMIC

## 2016-01-07 MED ORDER — TETRACAINE HCL 0.5 % OP SOLN
1.0000 [drp] | OPHTHALMIC | Status: AC
Start: 2016-01-07 — End: 2016-01-07
  Administered 2016-01-07 (×3): 1 [drp] via OPHTHALMIC

## 2016-01-07 MED ORDER — LACTATED RINGERS IV SOLN
INTRAVENOUS | Status: DC
  Administered 2016-01-07: 1000 mL via INTRAVENOUS

## 2016-01-07 MED ORDER — EPINEPHRINE HCL 1 MG/ML IJ SOLN
INTRAOCULAR | Status: DC | PRN
  Administered 2016-01-07: 500 mL

## 2016-01-07 MED ORDER — EPINEPHRINE HCL 1 MG/ML IJ SOLN
INTRAMUSCULAR | Status: AC
  Filled 2016-01-07: qty 1

## 2016-01-07 MED ORDER — LIDOCAINE 3.5 % OP GEL OPTIME - NO CHARGE
OPHTHALMIC | Status: DC | PRN
  Administered 2016-01-07: 2 [drp] via OPHTHALMIC

## 2016-01-07 MED ORDER — MIDAZOLAM HCL 2 MG/2ML IJ SOLN
1.0000 mg | INTRAMUSCULAR | Status: DC | PRN
  Administered 2016-01-07: 2 mg via INTRAVENOUS

## 2016-01-07 MED ORDER — PHENYLEPHRINE HCL 2.5 % OP SOLN
1.0000 [drp] | OPHTHALMIC | Status: AC
  Administered 2016-01-07 (×3): 1 [drp] via OPHTHALMIC

## 2016-01-07 MED ORDER — BSS IO SOLN
INTRAOCULAR | Status: DC | PRN
  Administered 2016-01-07: 15 mL

## 2016-01-07 MED ORDER — POVIDONE-IODINE 5 % OP SOLN
OPHTHALMIC | Status: DC | PRN
  Administered 2016-01-07: 1 via OPHTHALMIC

## 2016-01-07 MED ORDER — LIDOCAINE HCL (PF) 1 % IJ SOLN
INTRAMUSCULAR | Status: DC | PRN
  Administered 2016-01-07: .5 mL

## 2016-01-07 MED ORDER — FENTANYL CITRATE (PF) 100 MCG/2ML IJ SOLN
INTRAMUSCULAR | Status: AC
  Filled 2016-01-07: qty 2

## 2016-01-07 MED ORDER — PROVISC 10 MG/ML IO SOLN
INTRAOCULAR | Status: DC | PRN
  Administered 2016-01-07: 0.85 mL via INTRAOCULAR

## 2016-01-07 SURGICAL SUPPLY — 11 items
CLOTH BEACON ORANGE TIMEOUT ST (SAFETY) ×1 IMPLANT
EYE SHIELD UNIVERSAL CLEAR (GAUZE/BANDAGES/DRESSINGS) ×1 IMPLANT
GLOVE BIOGEL PI IND STRL 7.0 (GLOVE) IMPLANT
GLOVE BIOGEL PI INDICATOR 7.0 (GLOVE) ×2
GLOVE EXAM NITRILE MD LF STRL (GLOVE) ×1 IMPLANT
PAD ARMBOARD 7.5X6 YLW CONV (MISCELLANEOUS) ×1 IMPLANT
SIGHTPATH CAT PROC W REG LENS (Ophthalmic Related) ×2 IMPLANT
SYRINGE LUER LOK 1CC (MISCELLANEOUS) ×1 IMPLANT
TAPE SURG TRANSPORE 1 IN (GAUZE/BANDAGES/DRESSINGS) IMPLANT
TAPE SURGICAL TRANSPORE 1 IN (GAUZE/BANDAGES/DRESSINGS) ×1
WATER STERILE IRR 250ML POUR (IV SOLUTION) ×1 IMPLANT

## 2016-01-07 NOTE — Transfer of Care (Signed)
Immediate Anesthesia Transfer of Care Note  Patient: Daniel Vega  Procedure(s) Performed: Procedure(s) with comments: CATARACT EXTRACTION PHACO AND INTRAOCULAR LENS PLACEMENT (IOC) (Right) - CDE:15.11  Patient Location: Short Stay  Anesthesia Type:MAC  Level of Consciousness: awake, alert , oriented and patient cooperative  Airway & Oxygen Therapy: Patient Spontanous Breathing  Post-op Assessment: Report given to RN, Post -op Vital signs reviewed and stable and Patient moving all extremities  Post vital signs: Reviewed and stable  Last Vitals:  Filed Vitals:   01/07/16 1130 01/07/16 1135  BP: 129/71 128/70  Pulse:    Temp:    Resp: 16 19    Complications: No apparent anesthesia complications

## 2016-01-07 NOTE — Anesthesia Postprocedure Evaluation (Signed)
Anesthesia Post Note  Patient: Daniel Vega  Procedure(s) Performed: Procedure(s) (LRB): CATARACT EXTRACTION PHACO AND INTRAOCULAR LENS PLACEMENT (IOC) (Right)  Patient location during evaluation: Short Stay Anesthesia Type: MAC Level of consciousness: awake and alert, oriented and patient cooperative Pain management: pain level controlled Vital Signs Assessment: post-procedure vital signs reviewed and stable Respiratory status: nonlabored ventilation and spontaneous breathing Cardiovascular status: stable Postop Assessment: no signs of nausea or vomiting Anesthetic complications: no    Last Vitals:  Filed Vitals:   01/07/16 1130 01/07/16 1135  BP: 129/71 128/70  Pulse:    Temp:    Resp: 16 19    Last Pain: There were no vitals filed for this visit.               Coby Shrewsberry J

## 2016-01-07 NOTE — Discharge Instructions (Signed)
Cataract Surgery, Care After Refer to this sheet in the next few weeks. These instructions provide you with information on caring for yourself after your procedure. Your caregiver may also give you more specific instructions. Your treatment has been planned according to current medical practices, but problems sometimes occur. Call your caregiver if you have any problems or questions after your procedure.  HOME CARE INSTRUCTIONS   Avoid strenuous activities as directed by your caregiver.  Ask your caregiver when you can resume driving.  Use eyedrops or other medicines to help healing and control pressure inside your eye as directed by your caregiver.  Only take over-the-counter or prescription medicines for pain, discomfort, or fever as directed by your caregiver.  Do not to touch or rub your eyes.  You may be instructed to use a protective shield during the first few days and nights after surgery. If not, wear sunglasses to protect your eyes. This is to protect the eye from pressure or from being accidentally bumped.  Keep the area around your eye clean and dry. Avoid swimming or allowing water to hit you directly in the face while showering. Keep soap and shampoo out of your eyes.  Do not bend or lift heavy objects. Bending increases pressure in the eye. You can walk, climb stairs, and do light household chores.  Do not put a contact lens into the eye that had surgery until your caregiver says it is okay to do so.  Ask your doctor when you can return to work. This will depend on the kind of work that you do. If you work in a dusty environment, you may be advised to wear protective eyewear for a period of time.  Ask your caregiver when it will be safe to engage in sexual activity.  Continue with your regular eye exams as directed by your caregiver. What to expect:  It is normal to feel itching and mild discomfort for a few days after cataract surgery. Some fluid discharge is also common,  and your eye may be sensitive to light and touch.  After 1 to 2 days, even moderate discomfort should disappear. In most cases, healing will take about 6 weeks.  If you received an intraocular lens (IOL), you may notice that colors are very bright or have a blue tinge. Also, if you have been in bright sunlight, everything may appear reddish for a few hours. If you see these color tinges, it is because your lens is clear and no longer cloudy. Within a few months after receiving an IOL, these extra colors should go away. When you have healed, you will probably need new glasses. SEEK MEDICAL CARE IF:   You have increased bruising around your eye.  You have discomfort not helped by medicine. SEEK IMMEDIATE MEDICAL CARE IF:   You have a fever.  You have a worsening or sudden vision loss.  You have redness, swelling, or increasing pain in the eye.  You have a thick discharge from the eye that had surgery. MAKE SURE YOU:  Understand these instructions.  Will watch your condition.  Will get help right away if you are not doing well or get worse.   This information is not intended to replace advice given to you by your health care provider. Make sure you discuss any questions you have with your health care provider.   Document Released: 04/22/2005 Document Revised: 10/24/2014 Document Reviewed: 05/27/2011 Elsevier Interactive Patient Education [employer].

## 2016-01-07 NOTE — Op Note (Signed)
Date of Admission: 01/07/2016  Date of Surgery: 01/07/2016   Pre-Op Dx: Cataract Right Eye  Post-Op Dx: Senile Nuclear Cataract Right  Eye,  Dx Code H25.11  Surgeon: Tonny [place], M.D.  Assistants: None  Anesthesia: Topical with MAC  Indications: Painless, progressive loss of vision with compromise of daily activities.  Surgery: Cataract Extraction with Intraocular lens Implant Right Eye  Discription: The patient had dilating drops and viscous lidocaine placed into the Right eye in the pre-op holding area. After transfer to the operating room, a time out was performed. The patient was then prepped and draped. Beginning with a 16 degree blade a paracentesis port was made at the surgeon's 2 o'clock position. The anterior chamber was then filled with 1% non-preserved lidocaine. This was followed by filling the anterior chamber with Provisc.  A 2.46m keratome blade was used to make a clear corneal incision at the temporal limbus.  A bent cystatome needle was used to create a continuous tear capsulotomy. Hydrodissection was performed with balanced salt solution on a Fine canula. The lens nucleus was then removed using the phacoemulsification handpiece. Residual cortex was removed with the I&A handpiece. The anterior chamber and capsular bag were refilled with Provisc. A posterior chamber intraocular lens was placed into the capsular bag with it's injector. The implant was positioned with the Kuglan hook. The Provisc was then removed from the anterior chamber and capsular bag with the I&A handpiece. Stromal hydration of the main incision and paracentesis port was performed with BSS on a Fine canula. The wounds were tested for leak which was negative. The patient tolerated the procedure well. There were no operative complications. The patient was then transferred to the recovery room in stable condition.  Complications: None  Specimen: None  EBL: None  Prosthetic device: Hoya iSert 250, power 20.5 D,  SN [id number]

## 2016-01-07 NOTE — Anesthesia Preprocedure Evaluation (Signed)
Anesthesia Evaluation  Patient identified by MRN, date of birth, ID band Patient awake    Reviewed: Allergy & Precautions, H&P , NPO status , Patient's Chart, lab work & pertinent test results  History of Anesthesia Complications Negative for: history of anesthetic complications  Airway Mallampati: II  TM Distance: >3 FB Neck ROM: Full    Dental  (+) Edentulous Upper, Edentulous Lower   Pulmonary former smoker, PE Hx of lung cancer s/p resection, hx of PE initially on xarelto but s/p IVC filter placement secondary to active bleed    Pulmonary exam normal breath sounds clear to auscultation       Cardiovascular hypertension, Pt. on medications + CAD and + Past MI  Normal cardiovascular exam Rhythm:Regular Rate:Normal   EF 60%   Neuro/Psych negative neurological ROS  negative psych ROS   GI/Hepatic negative GI ROS, Neg liver ROS,   Endo/Other  negative endocrine ROS  Renal/GU Renal disease  negative genitourinary   Musculoskeletal  (+) Arthritis , Rheumatoid disorders and steroids,    Abdominal   Peds negative pediatric ROS (+)  Hematology  (+) anemia ,   Anesthesia Other Findings Hx of prostate cancer  Reproductive/Obstetrics negative OB ROS                             Anesthesia Physical Anesthesia Plan  ASA: III  Anesthesia Plan: MAC   Post-op Pain Management:    Induction: Intravenous  Airway Management Planned: Nasal Cannula  Additional Equipment:   Intra-op Plan:   Post-operative Plan:   Informed Consent: I have reviewed the patients History and Physical, chart, labs and discussed the procedure including the risks, benefits and alternatives for the proposed anesthesia with the patient or authorized representative who has indicated his/her understanding and acceptance.     Plan Discussed with:   Anesthesia Plan Comments:         Anesthesia Quick  Evaluation

## 2016-01-07 NOTE — H&P (Signed)
I have reviewed the H&P, the patient was re-examined, and I have identified no interval changes in medical condition and plan of care since the history and physical of record  

## 2016-01-08 ENCOUNTER — Encounter (HOSPITAL_COMMUNITY): Payer: Self-pay | Admitting: Ophthalmology

## 2016-02-01 ENCOUNTER — Other Ambulatory Visit: Payer: Self-pay | Admitting: Family Medicine

## 2016-02-01 ENCOUNTER — Ambulatory Visit
Admission: RE | Admit: 2016-02-01 | Discharge: 2016-02-01 | Disposition: A | Discharge status: Home / Self Care | Payer: Medicare Other | Source: Ambulatory Visit | Attending: Family Medicine | Admitting: Family Medicine

## 2016-02-01 DIAGNOSIS — Z85118 Personal history of other malignant neoplasm of bronchus and lung: Secondary | ICD-10-CM

## 2016-02-01 DIAGNOSIS — Z0000 Encounter for general adult medical examination without abnormal findings: Secondary | ICD-10-CM | POA: Diagnosis not present

## 2016-02-01 DIAGNOSIS — J984 Other disorders of lung: Secondary | ICD-10-CM | POA: Diagnosis not present

## 2016-02-09 DIAGNOSIS — L570 Actinic keratosis: Secondary | ICD-10-CM | POA: Diagnosis not present

## 2016-02-09 DIAGNOSIS — D485 Neoplasm of uncertain behavior of skin: Secondary | ICD-10-CM | POA: Diagnosis not present

## 2016-02-09 DIAGNOSIS — L821 Other seborrheic keratosis: Secondary | ICD-10-CM | POA: Diagnosis not present

## 2016-02-09 DIAGNOSIS — L820 Inflamed seborrheic keratosis: Secondary | ICD-10-CM | POA: Diagnosis not present

## 2016-02-23 ENCOUNTER — Emergency Department (HOSPITAL_COMMUNITY)
Admission: EM | Admit: 2016-02-23 | Discharge: 2016-02-24 | Disposition: A | Discharge status: Home / Self Care | Payer: Medicare Other | Attending: Emergency Medicine | Admitting: Emergency Medicine

## 2016-02-23 ENCOUNTER — Encounter (HOSPITAL_COMMUNITY): Payer: Self-pay | Admitting: *Deleted

## 2016-02-23 DIAGNOSIS — Y999 Unspecified external cause status: Secondary | ICD-10-CM | POA: Insufficient documentation

## 2016-02-23 DIAGNOSIS — Z87891 Personal history of nicotine dependence: Secondary | ICD-10-CM | POA: Insufficient documentation

## 2016-02-23 DIAGNOSIS — I2510 Atherosclerotic heart disease of native coronary artery without angina pectoris: Secondary | ICD-10-CM | POA: Insufficient documentation

## 2016-02-23 DIAGNOSIS — Y929 Unspecified place or not applicable: Secondary | ICD-10-CM | POA: Insufficient documentation

## 2016-02-23 DIAGNOSIS — L089 Local infection of the skin and subcutaneous tissue, unspecified: Secondary | ICD-10-CM | POA: Diagnosis not present

## 2016-02-23 DIAGNOSIS — M1990 Unspecified osteoarthritis, unspecified site: Secondary | ICD-10-CM | POA: Insufficient documentation

## 2016-02-23 DIAGNOSIS — I252 Old myocardial infarction: Secondary | ICD-10-CM | POA: Diagnosis not present

## 2016-02-23 DIAGNOSIS — R2242 Localized swelling, mass and lump, left lower limb: Secondary | ICD-10-CM | POA: Diagnosis present

## 2016-02-23 DIAGNOSIS — Z79899 Other long term (current) drug therapy: Secondary | ICD-10-CM | POA: Insufficient documentation

## 2016-02-23 DIAGNOSIS — Z7982 Long term (current) use of aspirin: Secondary | ICD-10-CM | POA: Insufficient documentation

## 2016-02-23 DIAGNOSIS — S70362A Insect bite (nonvenomous), left thigh, initial encounter: Secondary | ICD-10-CM | POA: Insufficient documentation

## 2016-02-23 DIAGNOSIS — W57XXXA Bitten or stung by nonvenomous insect and other nonvenomous arthropods, initial encounter: Secondary | ICD-10-CM | POA: Diagnosis not present

## 2016-02-23 DIAGNOSIS — E119 Type 2 diabetes mellitus without complications: Secondary | ICD-10-CM | POA: Diagnosis not present

## 2016-02-23 DIAGNOSIS — E7800 Pure hypercholesterolemia, unspecified: Secondary | ICD-10-CM | POA: Diagnosis not present

## 2016-02-23 DIAGNOSIS — I10 Essential (primary) hypertension: Secondary | ICD-10-CM | POA: Insufficient documentation

## 2016-02-23 DIAGNOSIS — Y939 Activity, unspecified: Secondary | ICD-10-CM | POA: Diagnosis not present

## 2016-02-23 NOTE — ED Notes (Signed)
Pt states that he removed a tick from his left groin area; pt states that was this afternoon; pt states that this evening he has redness and swelling to the area

## 2016-02-24 DIAGNOSIS — S70362A Insect bite (nonvenomous), left thigh, initial encounter: Secondary | ICD-10-CM | POA: Diagnosis not present

## 2016-02-24 MED ORDER — DOXYCYCLINE HYCLATE 100 MG PO TABS: 100.0000 mg | ORAL_TABLET | Freq: Two times a day (BID) | ORAL | Status: DC

## 2016-02-24 MED ORDER — DIPHENHYDRAMINE HCL 25 MG PO CAPS
25.0000 mg | ORAL_CAPSULE | Freq: Once | ORAL | Status: AC
  Administered 2016-02-24: 25 mg via ORAL
  Filled 2016-02-24: qty 1

## 2016-02-24 MED ORDER — DIPHENHYDRAMINE HCL 25 MG PO CAPS: 25.0000 mg | ORAL_CAPSULE | Freq: Three times a day (TID) | ORAL | Status: DC | PRN

## 2016-02-24 MED ORDER — DOXYCYCLINE HYCLATE 100 MG PO TABS
100.0000 mg | ORAL_TABLET | Freq: Once | ORAL | Status: AC
  Administered 2016-02-24: 100 mg via ORAL
  Filled 2016-02-24: qty 1

## 2016-02-24 NOTE — Discharge Instructions (Signed)
Tick Bite Information Ticks are insects that attach themselves to the skin and draw blood for food. There are various types of ticks. Common types include wood ticks and deer ticks. Most ticks live in shrubs and grassy areas. Ticks can climb onto your body when you make contact with leaves or grass where the tick is waiting. The most common places on the body for ticks to attach themselves are the scalp, neck, armpits, waist, and groin. Most tick bites are harmless, but sometimes ticks carry germs that cause diseases. These germs can be spread to a person during the tick's feeding process. The chance of a disease spreading through a tick bite depends on:   The type of tick.  Time of year.   How long the tick is attached.   Geographic location.  HOW CAN YOU PREVENT TICK BITES? Take these steps to help prevent tick bites when you are outdoors:  Wear protective clothing. Long sleeves and long pants are best.   Wear white clothes so you can see ticks more easily.  Tuck your pant legs into your socks.   If walking on a trail, stay in the middle of the trail to avoid brushing against bushes.  Avoid walking through areas with long grass.  Put insect repellent on all exposed skin and along boot tops, pant legs, and sleeve cuffs.   Check clothing, hair, and skin repeatedly and before going inside.   Brush off any ticks that are not attached.  Take a shower or bath as soon as possible after being outdoors.  WHAT IS THE PROPER WAY TO REMOVE A TICK? Ticks should be removed as soon as possible to help prevent diseases caused by tick bites. 1. If latex gloves are available, put them on before trying to remove a tick.  2. Using fine-point tweezers, grasp the tick as close to the skin as possible. You may also use curved forceps or a tick removal tool. Grasp the tick as close to its head as possible. Avoid grasping the tick on its body. 3. Pull gently with steady upward pressure until  the tick lets go. Do not twist the tick or jerk it suddenly. This may break off the tick's head or mouth parts. 4. Do not squeeze or crush the tick's body. This could force disease-carrying fluids from the tick into your body.  5. After the tick is removed, wash the bite area and your hands with soap and water or other disinfectant such as alcohol. 6. Apply a small amount of antiseptic cream or ointment to the bite site.  7. Wash and disinfect any instruments that were used.  Do not try to remove a tick by applying a hot match, petroleum jelly, or fingernail polish to the tick. These methods do not work and may increase the chances of disease being spread from the tick bite.  WHEN SHOULD YOU SEEK MEDICAL CARE? Contact your health care provider if you are unable to remove a tick from your skin or if a part of the tick breaks off and is stuck in the skin.  After a tick bite, you need to be aware of signs and symptoms that could be related to diseases spread by ticks. Contact your health care provider if you develop any of the following in the days or weeks after the tick bite:  Unexplained fever.  Rash. A circular rash that appears days or weeks after the tick bite may indicate the possibility of Lyme disease. The rash may resemble   a target with a bull's-eye and may occur at a different part of your body than the tick bite.  Redness and swelling in the area of the tick bite.   Tender, swollen lymph glands.   Diarrhea.   Weight loss.   Cough.   Fatigue.   Muscle, joint, or bone pain.   Abdominal pain.   Headache.   Lethargy or a change in your level of consciousness.  Difficulty walking or moving your legs.   Numbness in the legs.   Paralysis.  Shortness of breath.   Confusion.   Repeated vomiting.    This information is not intended to replace advice given to you by your health care provider. Make sure you discuss any questions you have with your health  care provider.   Document Released: 09/30/2000 Document Revised: 10/24/2014 Document Reviewed: 03/13/2013 Elsevier Interactive Patient Education 2016 Elsevier Inc.  

## 2016-02-24 NOTE — ED Provider Notes (Signed)
CSN: [id number]     Arrival date & time 02/23/16  2335 History  By signing my name below, I, Rowan Blase, attest that this documentation has been prepared under the direction and in the presence of Merryl Hacker, MD . Electronically Signed: Rowan Blase, Scribe. 02/24/2016. 12:39 AM.   Chief Complaint  Patient presents with  . Insect Bite   The history is provided by the patient. No language interpreter was used.   HPI Comments:  Daniel Vega is a 75 y.o. male with PMHx of HTN, HLD, CAD, PE, MI, DM and cancer who presents to the Emergency Department complaining of redness, itching and swelling to inner left upper thigh s/p tick bite yesterday afternoon. Pt states he removed a tick from this area ~9 hours ago; he removed half the tick with a knife and removed the head with tweezers. He states the tick was not engorged. No alleviating factors noted or treatments attempted PTA. He has been bitten by ticks previously without similar reactions. Denies nausea, vomiting, fever or current pain.  Denies rash.  Past Medical History  Diagnosis Date  . Hypertension   . Hypercholesteremia   . Glucose intolerance (impaired glucose tolerance)   . Kidney stones, calcium oxalate   . CAD (coronary artery disease)   . 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 Adenomatous colon polyp     Dr. Amedeo Plenty  . Prostate cancer [facility]) 2009    Dr. Eulogio Ditch; tx w/ radium implants  . Lung cancer ([place])   . Pulmonary embolus ([place])   . Hematuria   . S/P IVC filter   . Myocardial infarction ([place]) 1990    "I have had a few heart attcks, last 1990".  . Gout   . Arthritis   . Diabetes mellitus without complication [facility])    Past Surgical History  Procedure Laterality Date  . Lobectomy  1999    left - Dr. Cyndia Bent  . Repair right testicular torsion  1962  . Cardiac catheterization  1997    Dr. Martinique  . Prostate radium implants  2009  . Lung cancer surgery  1999  . Colonoscopy   ;11/12/99;04/29/03    12/27/09  . Lumbar laminectomy/decompression microdiscectomy  08/10/2012    Procedure: LUMBAR LAMINECTOMY/DECOMPRESSION MICRODISCECTOMY 1 LEVEL;  Surgeon: Winfield Cunas, MD;  Location: [place] [facility];  Service: Neurosurgery;  Laterality: Left;  LEFT Lumbar five-sacral one microdiskectomy  . Cystoscopy with retrograde pyelogram, ureteroscopy and stent placement Right 07/31/2014    Procedure: CYSTOSCOPY WITH RETROGRADE PYELOGRAM, URETEROSCOPY WITH FULGURATION AND STENT PLACEMENT;  Surgeon: [name], MD;  Location: [facility];  Service: Urology;  Laterality: Right;  . Vein ligation and stripping Right   . Cataract extraction w/phaco Left 12/10/2015    Procedure: CATARACT EXTRACTION PHACO AND INTRAOCULAR LENS PLACEMENT LEFT EYE CDE=10.98;  Surgeon: Tonny [place], MD;  Location: [facility];  Service: Ophthalmology;  Laterality: Left;  . Cataract extraction w/phaco Right 01/07/2016    Procedure: CATARACT EXTRACTION PHACO AND INTRAOCULAR LENS PLACEMENT (IOC);  Surgeon: Tonny [place], MD;  Location: [facility];  Service: Ophthalmology;  Laterality: Right;  CDE:15.11   Family History  Problem Relation Age of Onset  . Heart disease Mother   . Hypertension Mother   . Heart disease Father   . Hypertension Father   . Stroke Father   . Other Brother     spinal meningitis  . Other Brother     MVA  . Cancer Sister  stomach   Social History  Substance Use Topics  . Smoking status: Former Smoker -- 2.00 packs/day for 35 years    Types: Cigarettes    Quit date: 10/17/1982  . Smokeless tobacco: Former Systems developer    Types: Chew    Quit date: 03/15/2012  . Alcohol Use: No    Review of Systems  Constitutional: Negative for fever.  Gastrointestinal: Negative for nausea and vomiting.  Skin: Positive for color change and wound. Negative for rash.  All other systems reviewed and are negative.  Allergies  Codeine and Ibuprofen  Home Medications   Prior to Admission medications    Medication Sig Start Date End Date Taking? Authorizing Provider  allopurinol (ZYLOPRIM) 100 MG tablet Take 100 mg by mouth Daily. 07/23/12   Historical Provider, MD  ALPRAZolam Duanne Moron) 0.5 MG tablet Take 0.5 mg by mouth 3 (three) times daily as needed for anxiety (anxiety).     Historical Provider, MD  aspirin EC 81 MG tablet Take 81 mg by mouth every other day.    Historical Provider, MD  atorvastatin (LIPITOR) 40 MG tablet Take 1 tablet (40 mg total) by mouth every morning. 04/30/15   Peter M Martinique, MD  Cyanocobalamin (VITAMIN B 12 PO) Take 1,000 mcg by mouth daily.      Historical Provider, MD  docusate sodium (COLACE) 100 MG capsule Take 100 mg by mouth every other day.     Historical Provider, MD  finasteride (PROSCAR) 5 MG tablet Take 5 mg by mouth daily.    Historical Provider, MD  glucosamine-chondroitin 500-400 MG tablet Take 1 tablet by mouth 2 (two) times daily.     Historical Provider, MD  nitroGLYCERIN (NITROSTAT) 0.4 MG SL tablet Place 0.4 mg under the tongue every 5 (five) minutes as needed. For chest pain    Historical Provider, MD  verapamil (CALAN-SR) 240 MG CR tablet Take 240 mg by mouth 2 (two) times daily.    Historical Provider, MD   BP 181/84 mmHg  Pulse 81  Temp(Src) 98.5 F (36.9 C) (Oral)  Resp 20  SpO2 97% Physical Exam  Constitutional: He is oriented to person, place, and time. He appears well-developed and well-nourished.  HENT:  Head: Normocephalic and atraumatic.  Cardiovascular: Normal rate, regular rhythm and normal heart sounds.   No murmur heard. Pulmonary/Chest: Effort normal and breath sounds normal. No respiratory distress. He has no wheezes.  Abdominal: Soft. Bowel sounds are normal. There is no tenderness. There is no rebound.  Musculoskeletal: He exhibits no edema.  Neurological: He is alert and oriented to person, place, and time.  Skin: Skin is warm and dry.  Punctate insect bite noted to the left medial thigh, surrounding erythema extending  outward approximately 4 cm circumferentially, blanching  Psychiatric: He has a normal mood and affect.  Nursing note and vitals reviewed.     ED Course  Procedures  DIAGNOSTIC STUDIES:  Oxygen Saturation is 97% on RA, normal by my interpretation.    COORDINATION OF CARE:  12:25 AM Will administer antibiotic and benadryl. Will discharge with antibiotic. Advised pt to return if he develops rash on hands or feet or fever. Discussed treatment plan with pt at bedside and pt agreed to plan.  Labs Review Labs Reviewed - No data to display  Imaging Review No results found. I have personally reviewed and evaluated these images and lab results as part of my medical decision-making.   EKG Interpretation None      MDM   Final diagnoses:  Tick bite  Insect bite of left thigh with local reaction, initial encounter    Patient presents with a tick bite to the left groin area. Onset of fairly acute erythema adjacent to the tick bite. No other rash. Otherwise well-appearing. Suspect local reaction; however, early cellulitis is also consideration. No evidence of erythema migrans at this time; however, will place on doxycycline for possible early cellulitis and this should cover tickborne illness as well. Patient was also given Benadryl given concern for local reaction. He was given strict return precautions including fever, worsening pain, increasing redness or swelling.  After history, exam, and medical workup I feel the patient has been appropriately medically screened and is safe for discharge home. Pertinent diagnoses were discussed with the patient. Patient was given return precautions.  I personally performed the services described in this documentation, which was scribed in my presence. The recorded information has been reviewed and is accurate.    Merryl Hacker, MD 02/24/16 [phone]

## 2016-02-25 DIAGNOSIS — L03116 Cellulitis of left lower limb: Secondary | ICD-10-CM | POA: Diagnosis not present

## 2016-03-15 IMAGING — CR DG CHEST 2V
2 series · 2 of 2 positions shown · non-contrast
Comparison: 07/05/2014.  05/22/2014.

CLINICAL DATA: Low oxygen saturation.

EXAM:
CHEST  2 VIEW

[w chest pa]
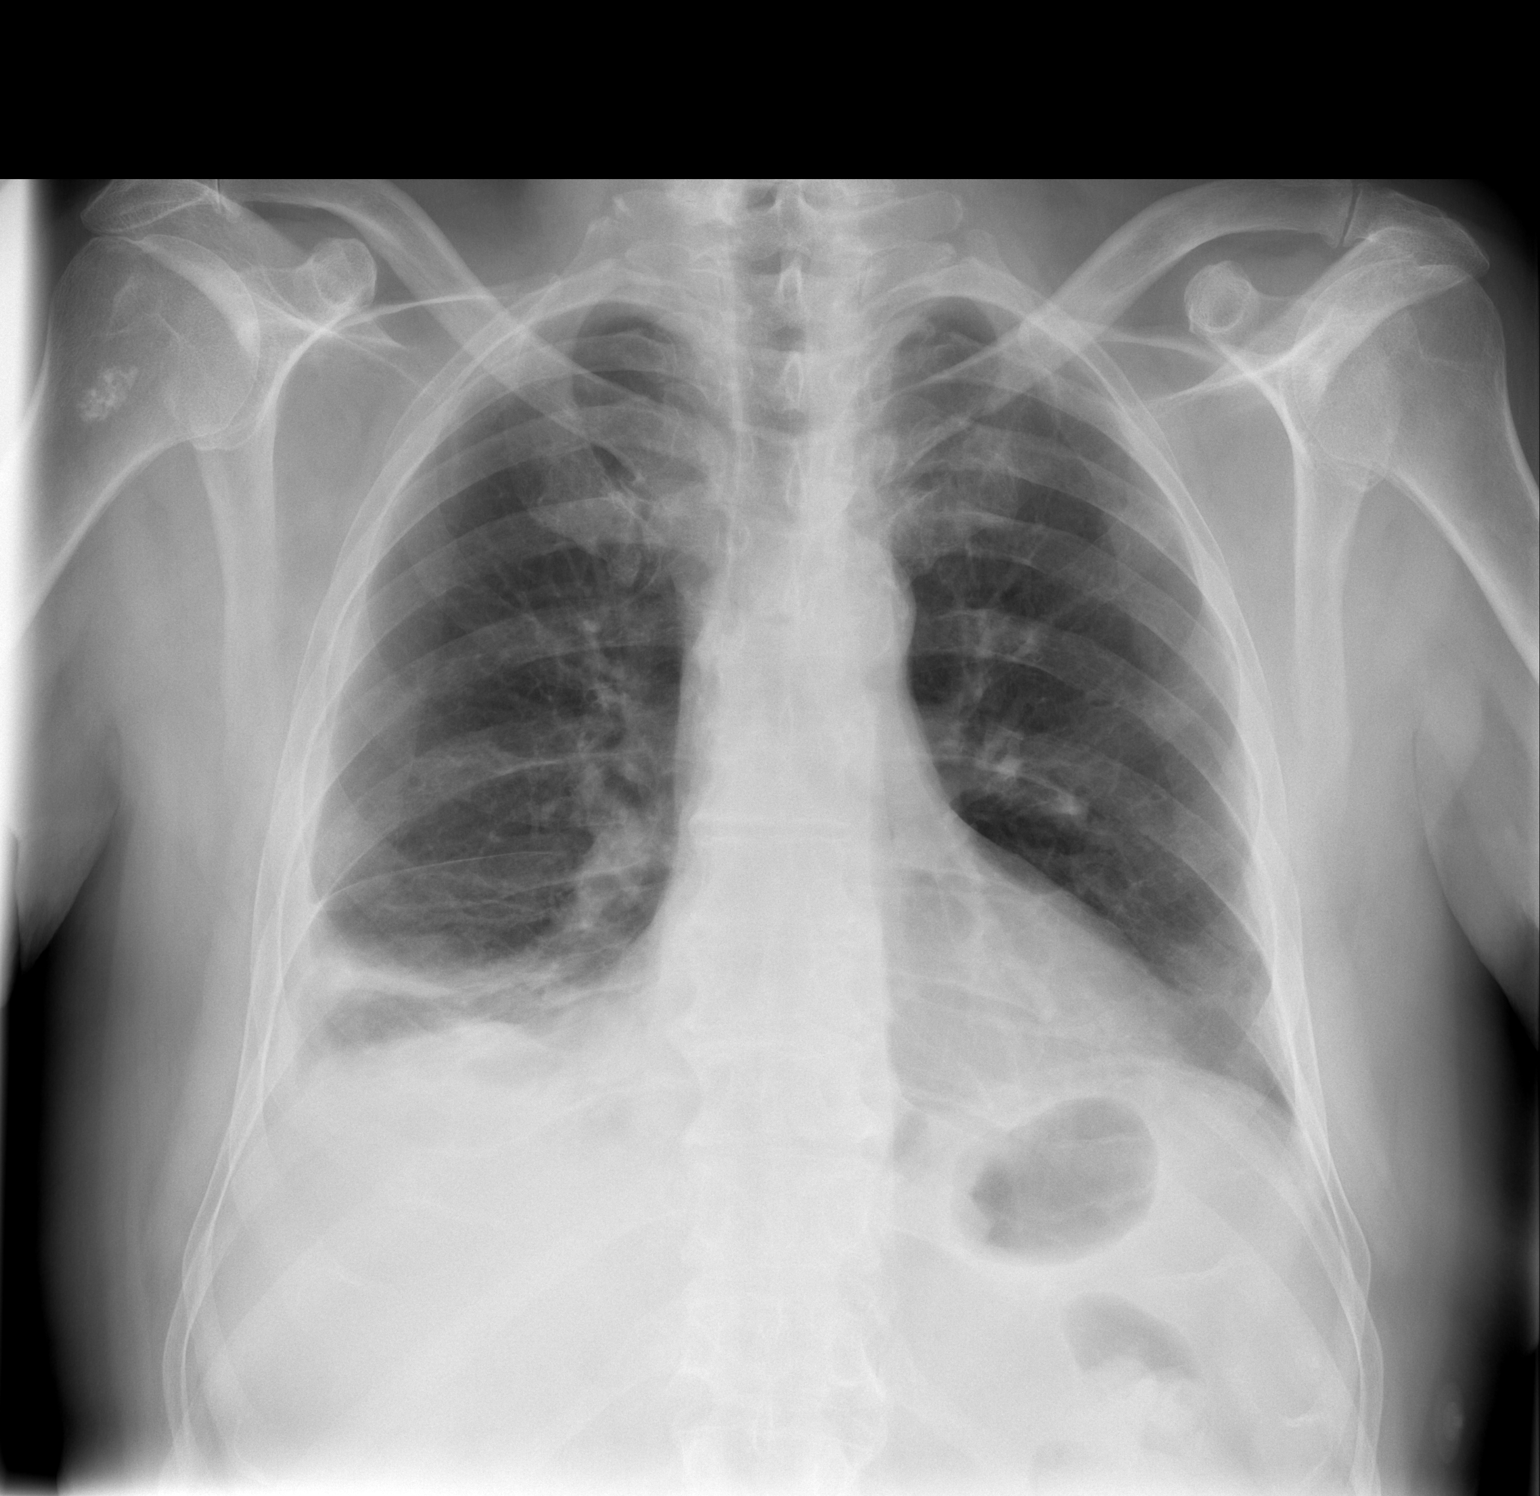

[w chest lat]
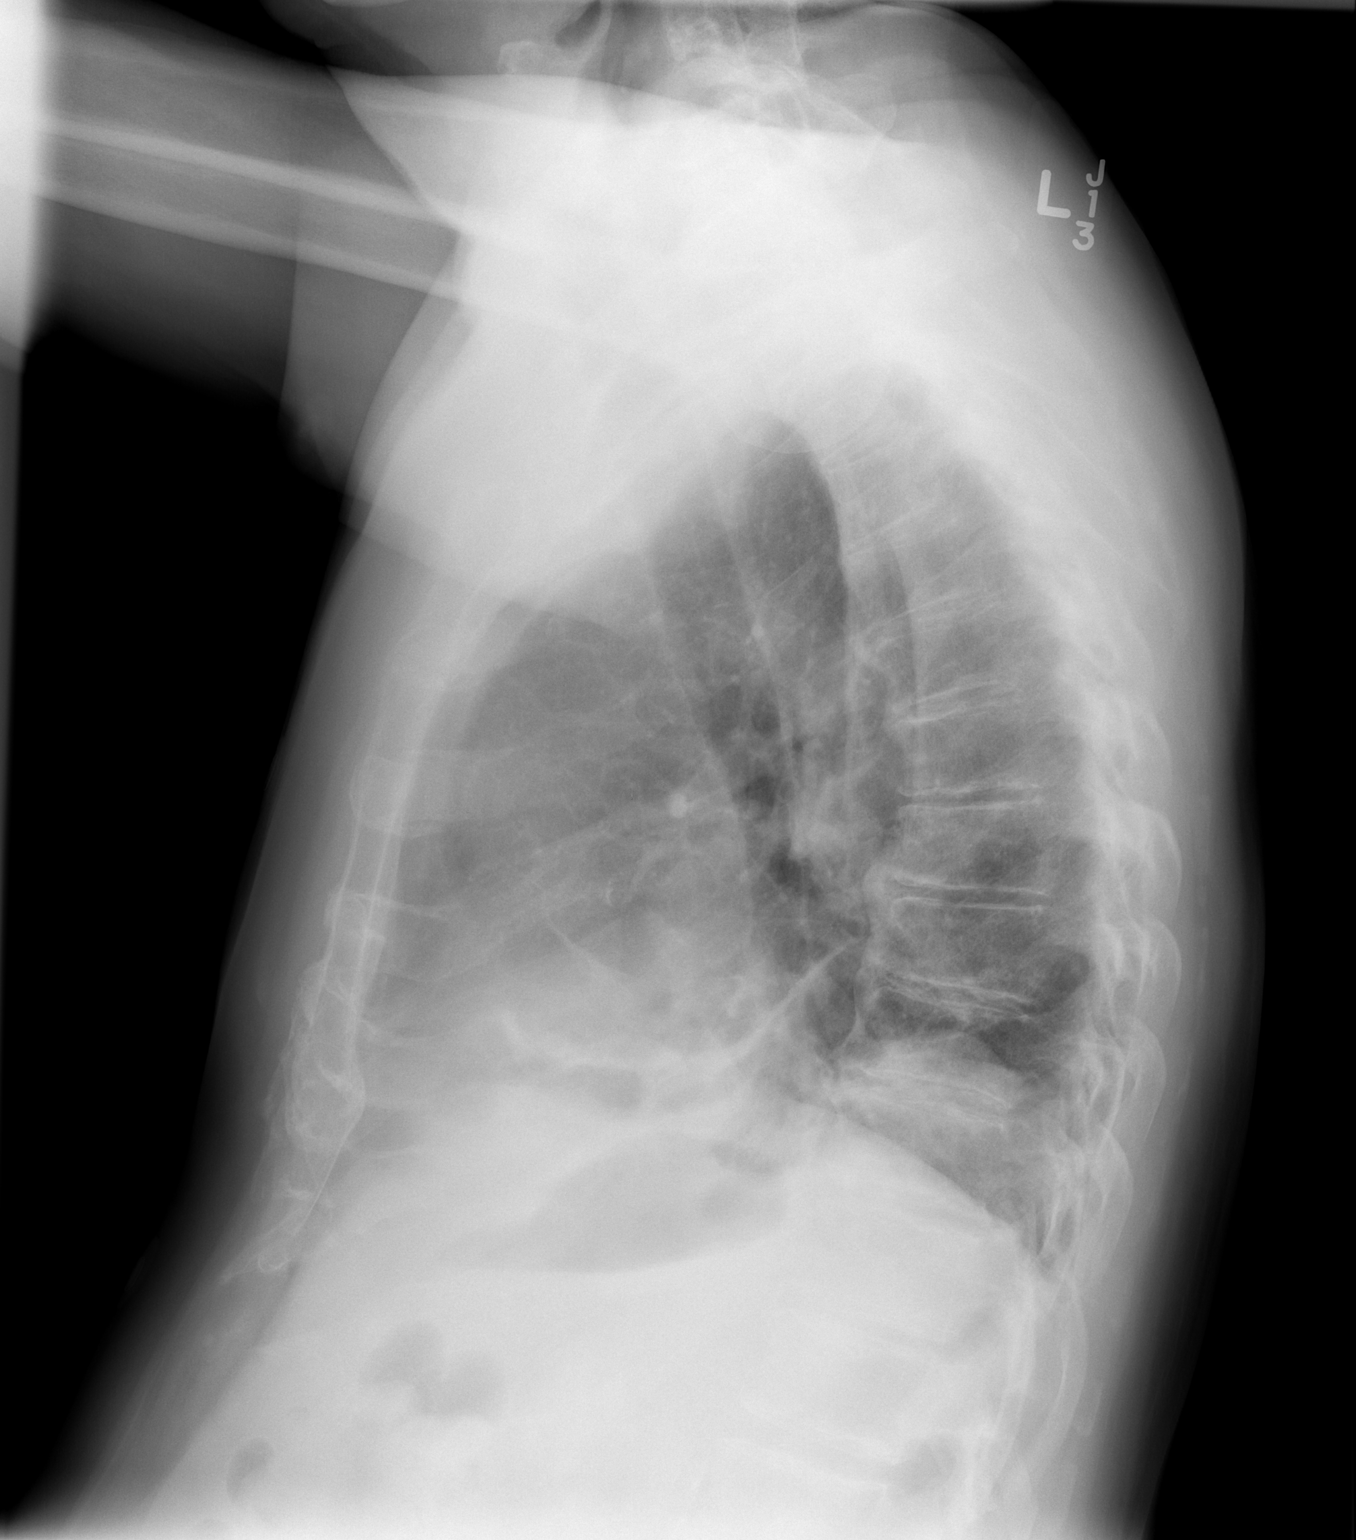

[2 of 2 positions shown; findings below may reference images not displayed]

FINDINGS: Opacity at the right base is stable since the previous study but new
since 05/22/2014. This may be related atelectasis although infection
could have this appearance. Probable small associated right pleural
effusion. Left lung is clear. The cardiopericardial silhouette is
within normal limits for size. Imaged bony structures of the thorax
are intact.
IMPRESSION: Stable. Right base atelectasis or pneumonia with small right pleural
effusion. Overall imaging features are stable in the 9 days since
the prior study.

## 2016-03-22 DIAGNOSIS — L570 Actinic keratosis: Secondary | ICD-10-CM | POA: Diagnosis not present

## 2016-03-22 DIAGNOSIS — L810 Postinflammatory hyperpigmentation: Secondary | ICD-10-CM | POA: Diagnosis not present

## 2016-03-27 IMAGING — CT CT RENAL STONE PROTOCOL
1 series · 14 of 28 positions shown, 18 images · non-contrast
Comparison: CT abdomen and pelvis without and with contrast
09/09/2013. CTA chest 07/14/2014.

CLINICAL DATA: Initial encounter for the right flank pain and
hematuria. Personal history of lung cancer. Recent history of
pulmonary embolus. Personal history of prostate cancer.

EXAM:
CT ABDOMEN AND PELVIS WITHOUT CONTRAST
TECHNIQUE: Multidetector CT imaging of the abdomen and pelvis was performed
following the standard protocol without IV contrast.

[Series 4: lung · axial · 0.79mm/px · z∈[-153,-33]mm · 14 of 28 slices shown, 18 images]
[im 3/28  soft-tissue]
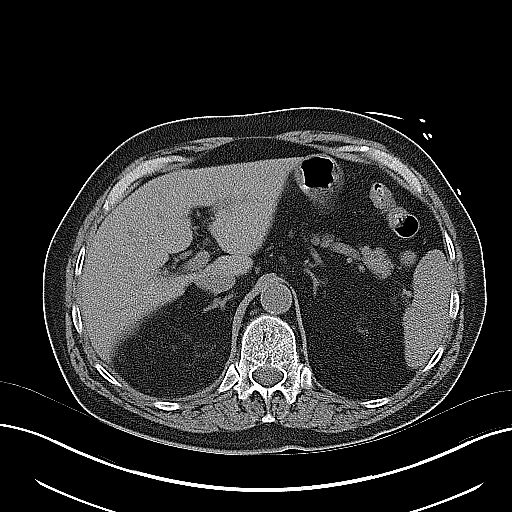
[im 3/28  bone]
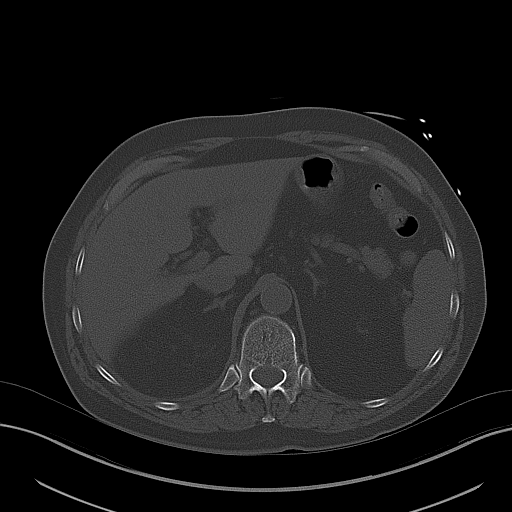
[im 5/28  soft-tissue]
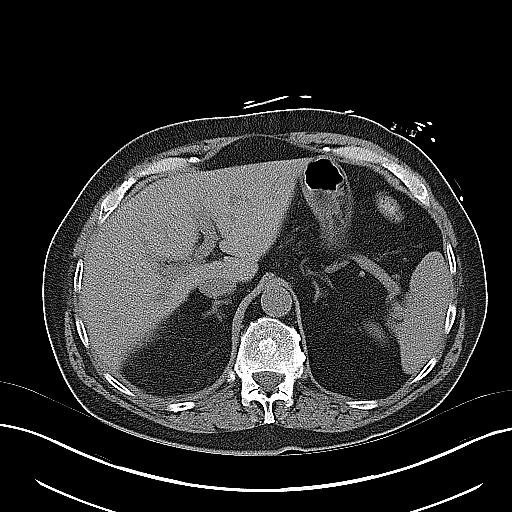
[im 7/28  soft-tissue]
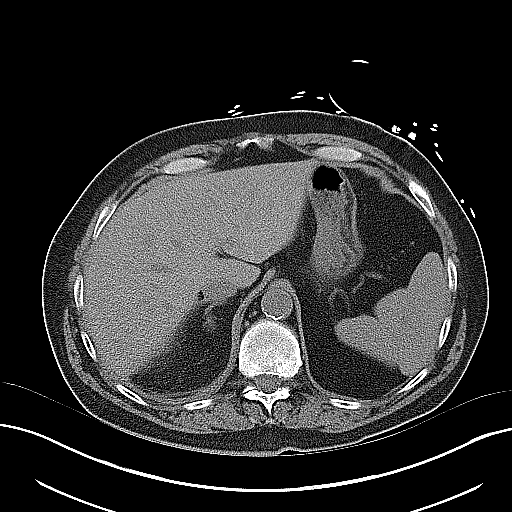
[im 9/28  soft-tissue]
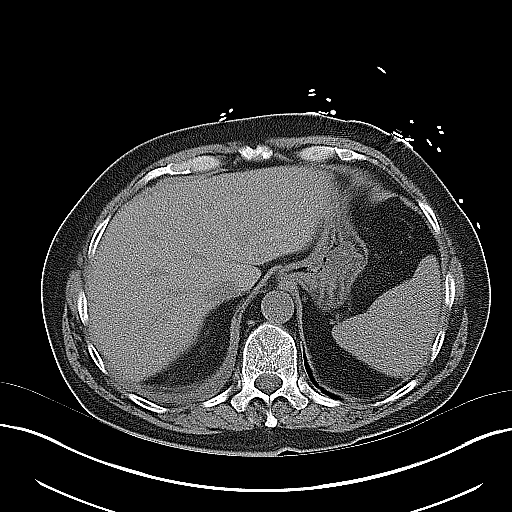
[im 11/28  soft-tissue]
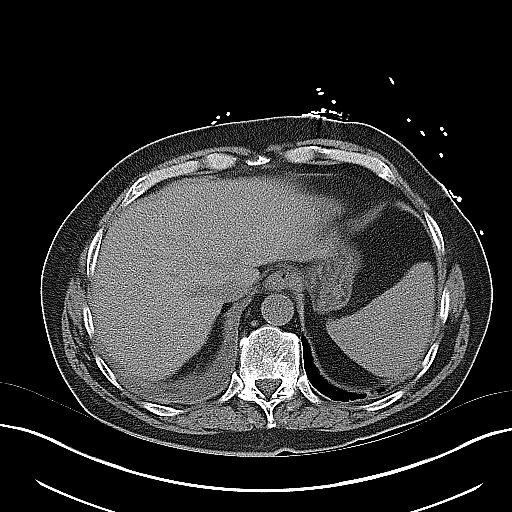
[im 13/28  soft-tissue]
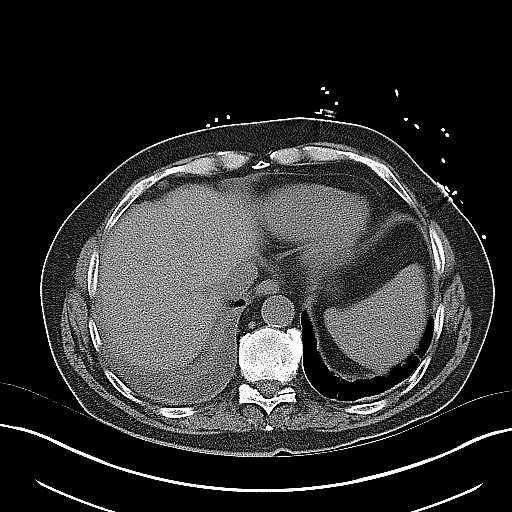
[im 16/28  soft-tissue]
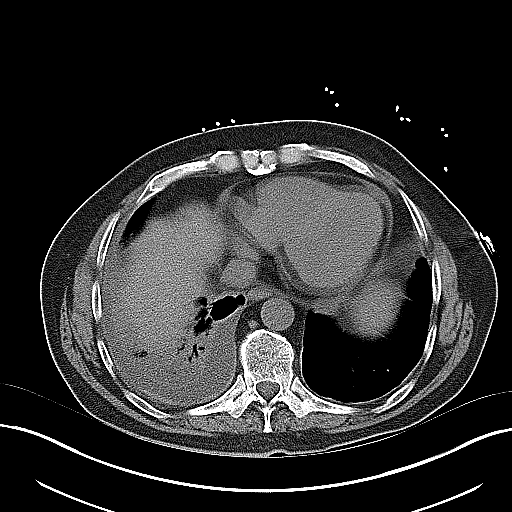
[im 18/28  soft-tissue]
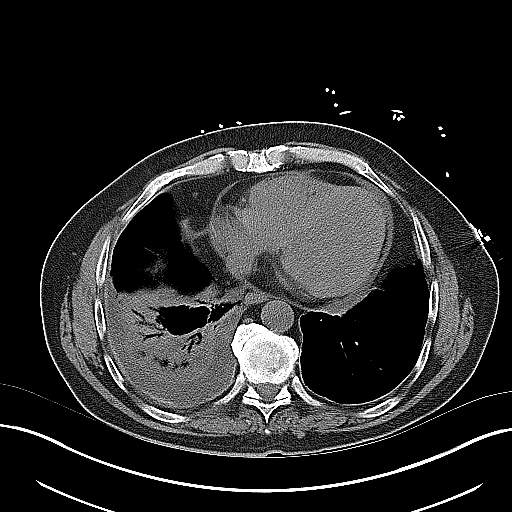
[im 20/28  soft-tissue]
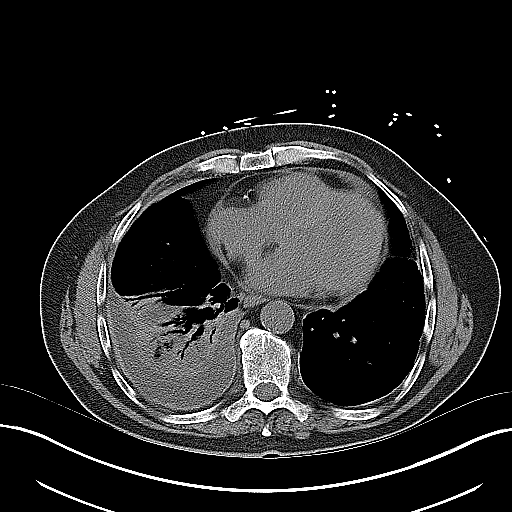
[im 20/28  bone]
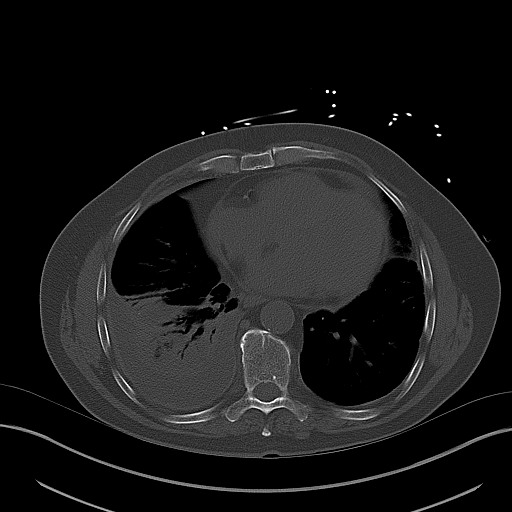
[im 22/28  soft-tissue]
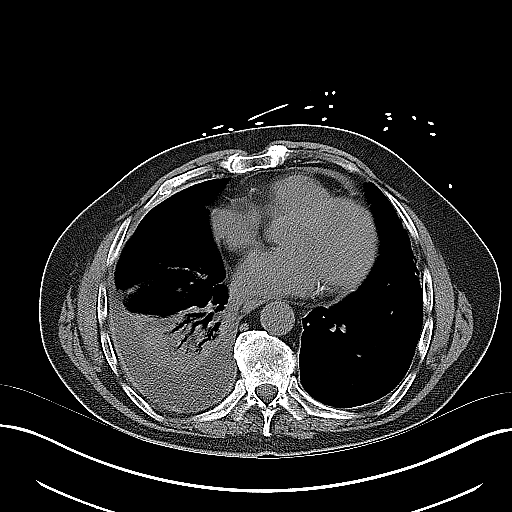
[im 24/28  soft-tissue]
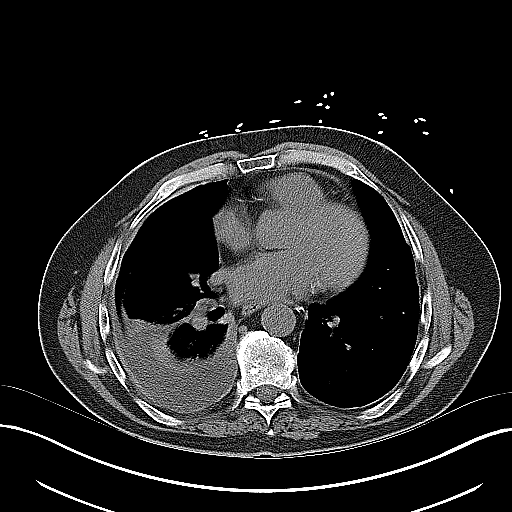
[im 24/28  lung]
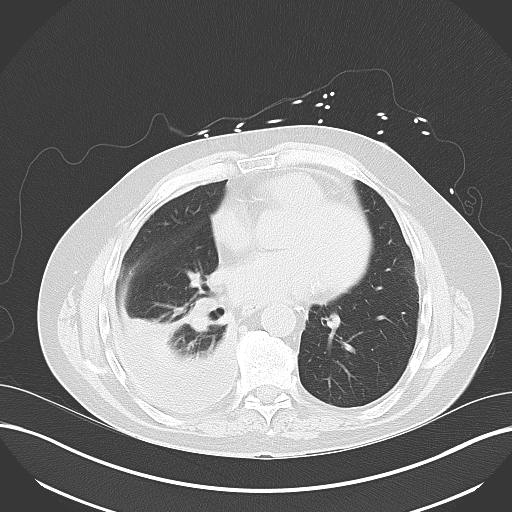
[im 25/28  lung]
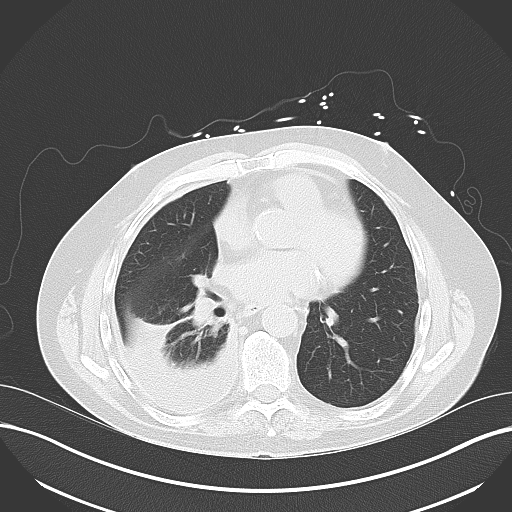
[im 26/28  soft-tissue]
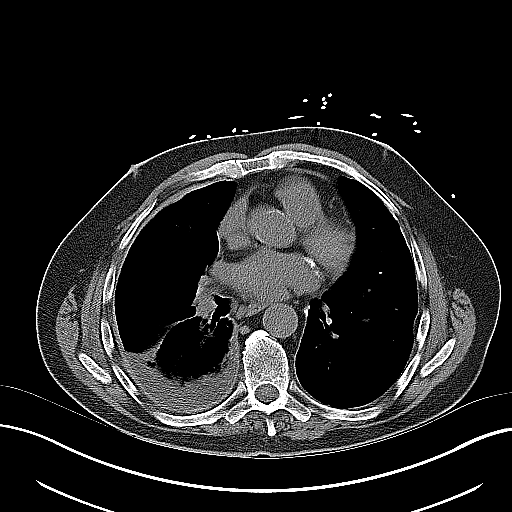
[im 26/28  lung]
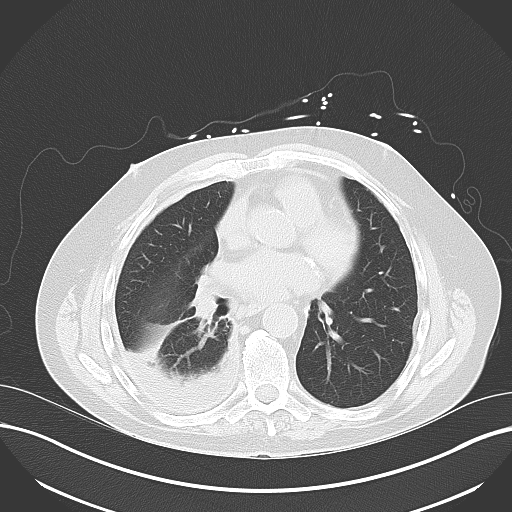
[im 27/28  lung]
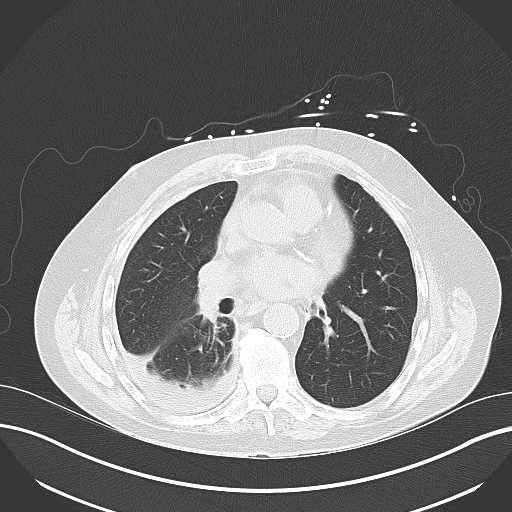

[14 of 28 positions shown; findings below may reference images not displayed]

FINDINGS: A right pleural effusion is stable to slightly increased in size
some prior study. Residual right lower lobe airspace disease remains
with possible infarct lung tissue. There is pleural thickening about
the effusion. Minimal atelectasis is present at the left base. The
heart size is normal. Coronary artery calcifications are present.
Small pericardial effusion is evident.

The liver and spleen are within normal limits. The stomach,
duodenum, and pancreas are unremarkable. The common bile duct and
gallbladder are within normal limits. The adrenal glands are normal
bilaterally. There is marked inflammatory change surrounding the
right kidney. Residual layering contrast is present within the
collecting system. There is a transition point where the ureter
crosses into the pelvis. No mass or stone is evident. The left
kidney is unremarkable. There is some stranding without significant
inflammation. The left ureter is within normal limits. Mild inferior
wall thickening is again seen along the urinary bladder. Prostate
seeds are evident.

The rectosigmoid colon is mostly collapsed. The more proximal colon
is within normal limits. The appendix is not discretely visualized
and may be surgically absent. The small bowel is unremarkable.
Prostate radiation seeds are noted.

The bone windows are unremarkable.
IMPRESSION: 1. Stable to slight increase in right pleural effusion with
associated pleural thickening compatible with the chronicity of the
fusion.
2. Persistent right lower lobe airspace disease, likely representing
infarcted tissue.
3. Slight increase in small pericardial effusion.
4. Coronary artery calcifications.
5. Moderate right-sided hydronephrosis with layering contrast still
visualized following CT angiogram study 2 weeks ago.
6. Focal transition of obstructed ureter at the pelvic inlet. No
discrete mass or stone is evident. This may be secondary to
surrounding inflammation. Urology consultation is recommended.
7. Prostate seeds.

## 2016-04-25 DIAGNOSIS — R310 Gross hematuria: Secondary | ICD-10-CM | POA: Diagnosis not present

## 2016-04-25 DIAGNOSIS — C61 Malignant neoplasm of prostate: Secondary | ICD-10-CM | POA: Diagnosis not present

## 2016-04-25 DIAGNOSIS — R32 Unspecified urinary incontinence: Secondary | ICD-10-CM | POA: Diagnosis not present

## 2016-07-04 ENCOUNTER — Encounter: Payer: Self-pay | Admitting: Cardiology

## 2016-07-04 DIAGNOSIS — Z23 Encounter for immunization: Secondary | ICD-10-CM | POA: Diagnosis not present

## 2016-07-14 NOTE — Progress Notes (Signed)
Daniel Vega Date of Birth: October 19, 1940   History of Present Illness: Daniel Vega is seen for followup CAD.  He has a history of CAD. He reports an MI in 1994.  In 2012 he had an abnormal Stress Echo. This led to a cardiac cath that showed diffuse dissection of the RCA with occlusion distally. There was good collateral flow. The left coronary showed diffuse calcific nonobstructive disease and he was treated medically. He has a history of HTN and HL. He has remote history of lung CA and is s/p left lobectomy in 1999 by Dr. Cyndia Bent. He has a history of prostate CA and is s/p radioactive seed implants in 2009. In September 2015 he presented with an acute PE and DVT. He was anticoagulated with Xarelto. 2 days later he developed gross hematuria with clots and urinary retention. An IVC filter was placed and Xarelto was stopped. He required several urologic procedures including ureteral stent placement.  Since then he has done very well. He denies any symptoms of chest pain or SOB. No bleeding. No edema or palpitations. No complaints today. In the past year he had bilateral cataract surgery and received antibiotics for a tick bit infection.  Current Outpatient Prescriptions on File Prior to Visit  Medication Sig Dispense Refill  . allopurinol (ZYLOPRIM) 100 MG tablet Take 100 mg by mouth Daily.    Marland Kitchen ALPRAZolam (XANAX) 0.5 MG tablet Take 0.5 mg by mouth 3 (three) times daily as needed for anxiety (anxiety).     Marland Kitchen aspirin EC 81 MG tablet Take 81 mg by mouth every other day.    Marland Kitchen atorvastatin (LIPITOR) 40 MG tablet Take 1 tablet (40 mg total) by mouth every morning. 90 tablet 3  . Cyanocobalamin (VITAMIN B 12 PO) Take 1,000 mcg by mouth daily.      . diphenhydrAMINE (BENADRYL) 25 mg capsule Take 1 capsule (25 mg total) by mouth every 8 (eight) hours as needed for itching. 30 capsule 0  . doxycycline (VIBRA-TABS) 100 MG tablet Take 1 tablet (100 mg total) by mouth 2 (two) times daily. 28 tablet 0  .  finasteride (PROSCAR) 5 MG tablet Take 5 mg by mouth daily.    Marland Kitchen glucosamine-chondroitin 500-400 MG tablet Take 1 tablet by mouth 2 (two) times daily.     . metFORMIN (GLUCOPHAGE-XR) 500 MG 24 hr tablet Take 500 mg by mouth daily.    . nitroGLYCERIN (NITROSTAT) 0.4 MG SL tablet Place 0.4 mg under the tongue every 5 (five) minutes as needed. For chest pain    . verapamil (CALAN-SR) 240 MG CR tablet Take 240 mg by mouth 2 (two) times daily.     No current facility-administered medications on file prior to visit.     Allergies  Allergen Reactions  . Codeine Palpitations    Made heart hurt  . Ibuprofen Palpitations    Past Medical History:  Diagnosis Date  . Adenomatous colon polyp    Dr. Amedeo Plenty  . 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 Arthritis   . CAD (coronary artery disease)   . Diabetes mellitus without complication ([place])   . Glucose intolerance (impaired glucose tolerance)   . Gout   . Hematuria   . Hypercholesteremia   . Hypertension   . Kidney stones, calcium oxalate   . Lung cancer ([place])   . Myocardial infarction ([place]) 1990   "I have had a few heart attcks, last 1990".  . Prostate cancer [facility]) 2009  Dr. Eulogio Ditch; tx w/ radium implants  . Pulmonary embolus ([place])   . S/P IVC filter     Past Surgical History:  Procedure Laterality Date  . CARDIAC CATHETERIZATION  1997   Dr. Martinique  . CATARACT EXTRACTION W/PHACO Left 12/10/2015   Procedure: CATARACT EXTRACTION PHACO AND INTRAOCULAR LENS PLACEMENT LEFT EYE CDE=10.98;  Surgeon: Tonny Branch, MD;  Location: [facility];  Service: Ophthalmology;  Laterality: Left;  . CATARACT EXTRACTION W/PHACO Right 01/07/2016   Procedure: CATARACT EXTRACTION PHACO AND INTRAOCULAR LENS PLACEMENT (IOC);  Surgeon: Tonny Branch, MD;  Location: [facility];  Service: Ophthalmology;  Laterality: Right;  CDE:15.11  . COLONOSCOPY  ;11/12/99;04/29/03   12/27/09  . CYSTOSCOPY WITH RETROGRADE PYELOGRAM, URETEROSCOPY AND STENT PLACEMENT Right  07/31/2014   Procedure: CYSTOSCOPY WITH RETROGRADE PYELOGRAM, URETEROSCOPY WITH FULGURATION AND STENT PLACEMENT;  Surgeon: [name], MD;  Location: [facility];  Service: Urology;  Laterality: Right;  . LOBECTOMY  1999   left - Dr. Cyndia Bent  . LUMBAR LAMINECTOMY/DECOMPRESSION MICRODISCECTOMY  08/10/2012   Procedure: LUMBAR LAMINECTOMY/DECOMPRESSION MICRODISCECTOMY 1 LEVEL;  Surgeon: Winfield Cunas, MD;  Location: [place] [facility];  Service: Neurosurgery;  Laterality: Left;  LEFT Lumbar five-sacral one microdiskectomy  . LUNG CANCER SURGERY  1999  . prostate radium implants  2009  . repair right testicular torsion  1962  . VEIN LIGATION AND STRIPPING Right     History  Smoking Status  . Former Smoker  . Packs/day: 2.00  . Years: 35.00  . Types: Cigarettes  . Quit date: 10/17/1982  Smokeless Tobacco  . Former Systems developer  . Types: Chew  . Quit date: 03/15/2012    History  Alcohol Use No    Family History  Problem Relation Age of Onset  . Heart disease Mother   . Hypertension Mother   . Heart disease Father   . Hypertension Father   . Stroke Father   . Other Brother     spinal meningitis  . Other Brother     MVA  . Cancer Sister     stomach    Review of Systems: The review of systems is as noted in HPI.  All other systems were reviewed and are negative.  Physical Exam: BP 132/64   Pulse 61   Ht '5\' 5"'$  (1.651 m)   Wt 200 lb (90.7 kg)   BMI 33.28 kg/m  The patient is alert and oriented x 3.  The mood and affect are normal.  The skin is warm and dry.  Color is normal.  The HEENT exam reveals that the sclera are nonicteric.  The mucous membranes are moist.  The carotids are 2+ without bruits.  There is no thyromegaly.  There is no JVD.  The lungs are clear.  The chest wall is non tender.  The heart exam reveals a regular rate with a normal S1 and S2.  There are no murmurs, gallops, or rubs.  The PMI is not displaced.   Abdominal exam reveals good bowel sounds.  There is no  guarding or rebound.  There is no hepatosplenomegaly or tenderness.  There are no masses.  Exam of the legs reveal no clubbing, cyanosis, or edema.  The legs are without rashes.  The distal pulses are intact.  Cranial nerves II - XII are intact.  Motor and sensory functions are intact.  The gait is normal.  LABORATORY DATA:  Lab Results  Component Value Date   WBC 6.5 12/08/2015   HGB 14.8 12/08/2015  HCT 43.3 12/08/2015   PLT 142 (L) 12/08/2015   GLUCOSE 153 (H) 12/08/2015   ALT 9 07/27/2014   AST 13 07/27/2014   NA 139 12/08/2015   K 4.0 12/08/2015   CL 107 12/08/2015   CREATININE 0.94 12/08/2015   BUN 14 12/08/2015   CO2 26 12/08/2015   TSH 4.050 07/27/2014   INR 1.08 08/12/2014   Labs dated 05/18/16: cholesterol 133, triglycerides 167, HDL 34, LDL 66. A1c 6.2%. CMET normal.    Echo 07/15/14:Study Conclusions  - Procedure narrative: Transthoracic echocardiography. Image quality was adequate. The study was technically difficult, as a result of poor sound wave transmission. - Left ventricle: The cavity size was normal. There was severe concentric hypertrophy. Systolic function was normal. The estimated ejection fraction was in the range of 60% to 65%. Wall motion was normal; there were no regional wall motion abnormalities. Doppler parameters are consistent with abnormal left ventricular relaxation (grade 1 diastolic dysfunction). The E/e&' ratio is between 8-15, suggesting indeterminate LV filling pressure. - Left atrium: The atrium was normal in size. - Tricuspid valve: There was mild regurgitation. - Pulmonary arteries: PA peak pressure: 34 mm Hg (S). - Systemic veins: Poorly visualized.  Impressions:  - LVEF 60-65%, severe concentric LVH, normal wall motion, diastolic dysfunction, indeterminate LV filling pressure, borderline pulmonary hypertension.  Assessment / Plan: 1. CAD with remote MI. Documented occlusion of the distal RCA with  collaterals. Diffuse calcific disease in the LCA. He is asymptomatic. Continue ASA and statin therapy. Follow up stress testing would not be helpful since this was abnormal in the past.  2. History of PE. S/p IVC filter due to bleeding complications on Xarelto. 3. HTN controlled on no meds. 4. Hyperlipidemia.well controlled on lipitor.   Overall he is doing very well from a cardiac standpoint. I will follow up in one year.

## 2016-07-15 ENCOUNTER — Encounter: Payer: Self-pay | Admitting: Cardiology

## 2016-07-15 ENCOUNTER — Ambulatory Visit (INDEPENDENT_AMBULATORY_CARE_PROVIDER_SITE_OTHER): Payer: Medicare Other | Admitting: Cardiology

## 2016-07-15 VITALS — BP 132/64 | HR 61 | Ht 65.0 in | Wt 200.0 lb

## 2016-07-15 DIAGNOSIS — E7800 Pure hypercholesterolemia, unspecified: Secondary | ICD-10-CM

## 2016-07-15 DIAGNOSIS — I10 Essential (primary) hypertension: Secondary | ICD-10-CM

## 2016-07-15 DIAGNOSIS — I2510 Atherosclerotic heart disease of native coronary artery without angina pectoris: Secondary | ICD-10-CM | POA: Diagnosis not present

## 2016-07-15 NOTE — Patient Instructions (Signed)
Continue your current therapy  I will see you in one year   

## 2016-12-08 DIAGNOSIS — C61 Malignant neoplasm of prostate: Secondary | ICD-10-CM | POA: Diagnosis not present

## 2016-12-08 DIAGNOSIS — E669 Obesity, unspecified: Secondary | ICD-10-CM | POA: Diagnosis not present

## 2016-12-08 DIAGNOSIS — E785 Hyperlipidemia, unspecified: Secondary | ICD-10-CM | POA: Diagnosis not present

## 2016-12-08 DIAGNOSIS — I10 Essential (primary) hypertension: Secondary | ICD-10-CM | POA: Diagnosis not present

## 2016-12-08 DIAGNOSIS — I700 Atherosclerosis of aorta: Secondary | ICD-10-CM | POA: Diagnosis not present

## 2016-12-08 DIAGNOSIS — M109 Gout, unspecified: Secondary | ICD-10-CM | POA: Diagnosis not present

## 2016-12-08 DIAGNOSIS — R7303 Prediabetes: Secondary | ICD-10-CM | POA: Diagnosis not present

## 2017-01-26 DIAGNOSIS — Z961 Presence of intraocular lens: Secondary | ICD-10-CM | POA: Diagnosis not present

## 2017-01-26 DIAGNOSIS — H26492 Other secondary cataract, left eye: Secondary | ICD-10-CM | POA: Diagnosis not present

## 2017-01-26 DIAGNOSIS — H5201 Hypermetropia, right eye: Secondary | ICD-10-CM | POA: Diagnosis not present

## 2017-01-26 DIAGNOSIS — H524 Presbyopia: Secondary | ICD-10-CM | POA: Diagnosis not present

## 2017-05-03 DIAGNOSIS — R310 Gross hematuria: Secondary | ICD-10-CM | POA: Diagnosis not present

## 2017-05-03 DIAGNOSIS — N481 Balanitis: Secondary | ICD-10-CM | POA: Diagnosis not present

## 2017-05-03 DIAGNOSIS — C61 Malignant neoplasm of prostate: Secondary | ICD-10-CM | POA: Diagnosis not present

## 2017-05-08 ENCOUNTER — Other Ambulatory Visit: Payer: Self-pay | Admitting: Family Medicine

## 2017-05-08 ENCOUNTER — Ambulatory Visit
Admission: RE | Admit: 2017-05-08 | Discharge: 2017-05-08 | Disposition: A | Discharge status: Home / Self Care | Payer: Medicare Other | Source: Ambulatory Visit | Attending: Family Medicine | Admitting: Family Medicine

## 2017-05-08 DIAGNOSIS — R1030 Lower abdominal pain, unspecified: Secondary | ICD-10-CM

## 2017-05-15 ENCOUNTER — Emergency Department (HOSPITAL_COMMUNITY)
Admission: EM | Admit: 2017-05-15 | Discharge: 2017-05-15 | Disposition: A | Discharge status: Home / Self Care | Payer: Medicare Other | Attending: Emergency Medicine | Admitting: Emergency Medicine

## 2017-05-15 ENCOUNTER — Encounter (HOSPITAL_COMMUNITY): Payer: Self-pay | Admitting: Emergency Medicine

## 2017-05-15 ENCOUNTER — Emergency Department (HOSPITAL_COMMUNITY): Payer: Medicare Other

## 2017-05-15 DIAGNOSIS — R1030 Lower abdominal pain, unspecified: Secondary | ICD-10-CM | POA: Diagnosis present

## 2017-05-15 DIAGNOSIS — N132 Hydronephrosis with renal and ureteral calculous obstruction: Secondary | ICD-10-CM | POA: Diagnosis not present

## 2017-05-15 DIAGNOSIS — E119 Type 2 diabetes mellitus without complications: Secondary | ICD-10-CM | POA: Diagnosis not present

## 2017-05-15 DIAGNOSIS — R1084 Generalized abdominal pain: Secondary | ICD-10-CM | POA: Diagnosis not present

## 2017-05-15 DIAGNOSIS — I10 Essential (primary) hypertension: Secondary | ICD-10-CM | POA: Insufficient documentation

## 2017-05-15 DIAGNOSIS — Z7982 Long term (current) use of aspirin: Secondary | ICD-10-CM | POA: Diagnosis not present

## 2017-05-15 DIAGNOSIS — Z79899 Other long term (current) drug therapy: Secondary | ICD-10-CM | POA: Insufficient documentation

## 2017-05-15 DIAGNOSIS — N1330 Unspecified hydronephrosis: Secondary | ICD-10-CM | POA: Diagnosis not present

## 2017-05-15 DIAGNOSIS — Z8546 Personal history of malignant neoplasm of prostate: Secondary | ICD-10-CM | POA: Diagnosis not present

## 2017-05-15 DIAGNOSIS — Z87891 Personal history of nicotine dependence: Secondary | ICD-10-CM | POA: Insufficient documentation

## 2017-05-15 DIAGNOSIS — R319 Hematuria, unspecified: Secondary | ICD-10-CM | POA: Diagnosis not present

## 2017-05-15 DIAGNOSIS — I2510 Atherosclerotic heart disease of native coronary artery without angina pectoris: Secondary | ICD-10-CM | POA: Diagnosis not present

## 2017-05-15 DIAGNOSIS — R109 Unspecified abdominal pain: Secondary | ICD-10-CM

## 2017-05-15 LAB — URINALYSIS, ROUTINE W REFLEX MICROSCOPIC
BACTERIA UA: NONE SEEN
BILIRUBIN URINE: NEGATIVE
Glucose, UA: NEGATIVE mg/dL
KETONES UR: NEGATIVE mg/dL
LEUKOCYTES UA: NEGATIVE
NITRITE: NEGATIVE
PROTEIN: 100 mg/dL — AB
SPECIFIC GRAVITY, URINE: 1.01 (ref 1.005–1.030)
pH: 6 (ref 5.0–8.0)

## 2017-05-15 LAB — COMPREHENSIVE METABOLIC PANEL
ALBUMIN: 4.1 g/dL (ref 3.5–5.0)
ALT: 13 U/L — ABNORMAL LOW (ref 17–63)
ANION GAP: 8 (ref 5–15)
AST: 16 U/L (ref 15–41)
Alkaline Phosphatase: 50 U/L (ref 38–126)
BILIRUBIN TOTAL: 0.7 mg/dL (ref 0.3–1.2)
BUN: 29 mg/dL — AB (ref 6–20)
CHLORIDE: 105 mmol/L (ref 101–111)
CO2: 25 mmol/L (ref 22–32)
Calcium: 8.9 mg/dL (ref 8.9–10.3)
Creatinine, Ser: 2 mg/dL — ABNORMAL HIGH (ref 0.61–1.24)
GFR calc Af Amer: 36 mL/min — ABNORMAL LOW (ref >60)
GFR, EST NON AFRICAN AMERICAN: 31 mL/min — AB (ref >60)
GLUCOSE: 107 mg/dL — AB (ref 65–99)
POTASSIUM: 4.3 mmol/L (ref 3.5–5.1)
Sodium: 138 mmol/L (ref 135–145)
TOTAL PROTEIN: 7.9 g/dL (ref 6.5–8.1)

## 2017-05-15 LAB — CBC
HEMATOCRIT: 37 % — AB (ref 39.0–52.0)
HEMOGLOBIN: 12.9 g/dL — AB (ref 13.0–17.0)
MCH: 32.6 pg (ref 26.0–34.0)
MCHC: 34.9 g/dL (ref 30.0–36.0)
MCV: 93.4 fL (ref 78.0–100.0)
Platelets: 170 10*3/uL (ref 150–400)
RBC: 3.96 MIL/uL — ABNORMAL LOW (ref 4.22–5.81)
RDW: 12.9 % (ref 11.5–15.5)
WBC: 7.7 10*3/uL (ref 4.0–10.5)

## 2017-05-15 LAB — LIPASE, BLOOD: LIPASE: 32 U/L (ref 11–51)

## 2017-05-15 MED ORDER — SODIUM CHLORIDE 0.9 % IV BOLUS (SEPSIS)
500.0000 mL | Freq: Once | INTRAVENOUS | Status: AC
  Administered 2017-05-15: 500 mL via INTRAVENOUS

## 2017-05-15 MED ORDER — IOPAMIDOL (ISOVUE-300) INJECTION 61%
30.0000 mL | Freq: Once | INTRAVENOUS | Status: AC | PRN
  Administered 2017-05-15: 30 mL via ORAL

## 2017-05-15 MED ORDER — ACETAMINOPHEN 325 MG PO TABS
650.0000 mg | ORAL_TABLET | Freq: Once | ORAL | Status: AC
  Administered 2017-05-15: 650 mg via ORAL
  Filled 2017-05-15: qty 2

## 2017-05-15 MED ORDER — IOPAMIDOL (ISOVUE-300) INJECTION 61%
INTRAVENOUS | Status: AC
  Filled 2017-05-15: qty 100

## 2017-05-15 MED ORDER — IOPAMIDOL (ISOVUE-300) INJECTION 61%
INTRAVENOUS | Status: AC
  Administered 2017-05-15: 30 mL via ORAL
  Filled 2017-05-15: qty 30

## 2017-05-15 NOTE — ED Notes (Signed)
Pt ambulatory to restroom

## 2017-05-15 NOTE — ED Provider Notes (Signed)
  Face-to-face evaluation   History: Patient here with abdominal pain associated with hematuria, and "dribbling urine."  Onset symptoms 2 weeks ago.  He follows regularly with urology regarding chronic hematuria.  Status post prostate cancer.  Physical exam: Alert elderly man.  He is overweight.  No respiratory distress.  He is lucid.  Medical screening examination/treatment/procedure(s) were conducted as a shared visit with non-physician practitioner(s) and myself.  I personally evaluated the patient during the encounter   Daleen Bo, MD 05/16/17 [phone]

## 2017-05-15 NOTE — ED Notes (Signed)
ED PA at bedside

## 2017-05-15 NOTE — ED Notes (Signed)
Called CT to make sure they did not administer contrast.

## 2017-05-15 NOTE — ED Notes (Signed)
Pt ambulatory and independent at discharge.  Verbalized understanding of discharge instructions 

## 2017-05-15 NOTE — ED Provider Notes (Signed)
Unexplained renal failure in an elderly male with hx of prostate cancer.  Also hypertensive concerning for hypertensive emergency.  Await CT scan.  Likely admission.    BP 211/89.    5:28 PM Patient with recurrent low abdominal pain, abdominal distention ongoing for the past 2 weeks. History of prostate cancer status post radiation treatment in the past. His urologist is Dr. Dorina Hoyer.  He complaining of recurrent hematuria. Urine today did shows RBC too numerous to count. Abnormal renal function with BUN 29, creatinine 2.0. Abdominal and pelvis CT scan demonstrated moderate bilateral hydroureteronephrosis, along with mild bladder wall thickening. Normal renal function.  5:53 PM Appreciate consultation from oncall urologist Dr. Karsten Ro who felt pt can be evaluate further urologically in office next week.    6:06 PM Pt currently on verapamil for HTN.  He has hx of underlying HTN.  He can f/u closely with PCP for further management of his hypertension.  Doubt hypertensive urgency causing renal failure.  I discussed with Dr. Eulis Foster who felt pt can be d/c with close f/u outpt.  Return precaution given.  Tylenol given for pain.    BP (!) 189/93 (BP Location: Left Arm)   Pulse 83   Temp 97.8 F (36.6 C) (Oral)   Resp 20   Ht 5\' 4"  (1.626 m)   Wt 88.9 kg (196 lb)   SpO2 100%   BMI 33.64 kg/m   Results for orders placed or performed during the hospital encounter of 05/15/17  Lipase, blood  Result Value Ref Range   Lipase 32 11 - 51 U/L  Comprehensive metabolic panel  Result Value Ref Range   Sodium 138 135 - 145 mmol/L   Potassium 4.3 3.5 - 5.1 mmol/L   Chloride 105 101 - 111 mmol/L   CO2 25 22 - 32 mmol/L   Glucose, Bld 107 (H) 65 - 99 mg/dL   BUN 29 (H) 6 - 20 mg/dL   Creatinine, Ser 2.00 (H) 0.61 - 1.24 mg/dL   Calcium 8.9 8.9 - 10.3 mg/dL   Total Protein 7.9 6.5 - 8.1 g/dL   Albumin 4.1 3.5 - 5.0 g/dL   AST 16 15 - 41 U/L   ALT 13 (L) 17 - 63 U/L   Alkaline Phosphatase 50 38 - 126  U/L   Total Bilirubin 0.7 0.3 - 1.2 mg/dL   GFR calc non Af Amer 31 (L) >60 mL/min   GFR calc Af Amer 36 (L) >60 mL/min   Anion gap 8 5 - 15  CBC  Result Value Ref Range   WBC 7.7 4.0 - 10.5 K/uL   RBC 3.96 (L) 4.22 - 5.81 MIL/uL   Hemoglobin 12.9 (L) 13.0 - 17.0 g/dL   HCT 37.0 (L) 39.0 - 52.0 %   MCV 93.4 78.0 - 100.0 fL   MCH 32.6 26.0 - 34.0 pg   MCHC 34.9 30.0 - 36.0 g/dL   RDW 12.9 11.5 - 15.5 %   Platelets 170 150 - 400 K/uL  Urinalysis, Routine w reflex microscopic  Result Value Ref Range   Color, Urine YELLOW YELLOW   APPearance CLEAR CLEAR   Specific Gravity, Urine 1.010 1.005 - 1.030   pH 6.0 5.0 - 8.0   Glucose, UA NEGATIVE NEGATIVE mg/dL   Hgb urine dipstick LARGE (A) NEGATIVE   Bilirubin Urine NEGATIVE NEGATIVE   Ketones, ur NEGATIVE NEGATIVE mg/dL   Protein, ur 100 (A) NEGATIVE mg/dL   Nitrite NEGATIVE NEGATIVE   Leukocytes, UA NEGATIVE NEGATIVE  RBC / HPF TOO NUMEROUS TO COUNT 0 - 5 RBC/hpf   WBC, UA 6-30 0 - 5 WBC/hpf   Bacteria, UA NONE SEEN NONE SEEN   Squamous Epithelial / LPF 0-5 (A) NONE SEEN   Mucous PRESENT    Ct Abdomen Pelvis Wo Contrast  Result Date: 05/15/2017 CLINICAL DATA:  Mid abdominal pain x2 weeks, abdominal distention, hematuria. History is prostate cancer status post brachytherapy seeds. EXAM: CT ABDOMEN AND PELVIS WITHOUT CONTRAST TECHNIQUE: Multidetector CT imaging of the abdomen and pelvis was performed following the standard protocol without IV contrast. COMPARISON:  12/03/2014 FINDINGS: Lower chest: Trace pleural fluid/ thickening at the right lung base. Mild scarring in the right lower lobe. Hepatobiliary: Unenhanced liver is unremarkable. Gallbladder is within normal limits. No intrahepatic or extrahepatic ductal dilatation. Pancreas: Within normal limits. Spleen: Within normal limits. Adrenals/Urinary Tract: Adrenal glands are within normal limits. Kidneys are within normal limits. Moderate bilateral hydroureteronephrosis. Mild bladder  wall thickening along posterior bladder base (series 2/image 79), indeterminate. Stomach/Bowel: Stomach is within normal limits. No evidence of bowel obstruction. Normal appendix (series 2/image 59). Vascular/Lymphatic: No evidence of abdominal aortic aneurysm. Atherosclerotic calcifications of the abdominal aorta and branch vessels. IVC filter in satisfactory position. No suspicious abdominopelvic lymphadenopathy. Reproductive: Brachytherapy seeds in the prostate. Other: No abdominopelvic ascites. Musculoskeletal: Degenerative changes of the right hip with possible avascular necrosis of the right humeral head. Mild degenerative changes of the lower lumbar spine. IMPRESSION: Brachytherapy seeds in the prostate. Mild bladder wall thickening along the posterior bladder base. Cystoscopic correlation is suggested. Associated moderate bilateral hydroureteronephrosis. Additional ancillary findings as above. Electronically Signed   By: Julian Hy M.D.   On: 05/15/2017 16:32   Dg Abd 2 Views  Result Date: 05/08/2017 CLINICAL DATA:  Lower abdominal pain with constipation x 2 weeks, r/o SBO EXAM: ABDOMEN - 2 VIEW COMPARISON:  CT of the abdomen and pelvis 12/03/2014 FINDINGS: Bowel gas pattern is nonobstructed. There is a moderate stool burden throughout nondilated loops of bowel. No evidence for organomegaly. No abnormal calcifications. An inferior vena cava filter is in place, apex overlying L2-3. Degenerative changes are seen in the lower lumbar spine and SI joints. IMPRESSION: Moderate stool burden.  No evidence for acute  abnormality. Electronically Signed   By: Nolon Nations M.D.   On: 05/08/2017 14:24      Domenic Moras, PA-C 05/15/17 1813    Domenic Moras, PA-C 05/15/17 1815    Daleen Bo, MD 05/16/17 Dyann Kief    Daleen Bo, MD 05/16/17 [phone]

## 2017-05-15 NOTE — ED Triage Notes (Signed)
Patient c/o mid abd pain x 2 weeks.  Patient seen prostate MD who checked everything out and was fine. Saw PCP who lost power so patient came here to be evaluated.   Patient reports taking Tylenol to help with abd pain but will have blood in urine all night.  Patient reports that had constipation and took Miralax three different times to help with that. patient also having abd distention over the past couple days per wife. patient reports sometimes he has knot that will come up beside his belly button.

## 2017-05-15 NOTE — ED Provider Notes (Signed)
[facility] DEPT Provider Note   CSN: [id number] Arrival date & time: 05/15/17  1054     History   Chief Complaint Chief Complaint  Patient presents with  . Abdominal Pain    HPI Daniel Vega is a 76 y.o. male.  HPI Daniel Vega is a 76 y.o. male with history of diabetes, coronary disease, hypertension, kidney stones, prior lung cancer, prior prostate cancer, presents to emergency department with complaint of abdominal pain. Patient reports pain has been there for 2 weeks. He reports pain is in the lower abdomen. He states he initially went to urologist who performed blood work and urinalysis. He states that those tests came back normal. He was seen by his family doctor last week and had negative x-ray of his abdomen. His pain continued. He states he felt like he could be a constipated and took MiraLAX. He states he had large bowel movement several days ago and felt better, however pain return. He went to his family doctor today, but due to the losing power he was sent here. Patient denies any nausea or vomiting. No blood in his stool. No fever or chills. He states he abdomen does feel distended. He also reports intermittent swelling around his umbilicus that sounds like could be possible hernia. He reports he has been taking Tylenol for his pain which relieves his pain completely. He denies any urinary symptoms. He states he is chronically somewhat incontinent, sometimes has severe urge and unable to get to the bathroom in time. He states he has chronic urinary frequency. He also states he has had some intermittent hematuria.  Past Medical History:  Diagnosis Date  . Adenomatous colon polyp    Dr. Amedeo Plenty  . 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 Arthritis   . CAD (coronary artery disease)   . Diabetes mellitus without complication ([place])   . Glucose intolerance (impaired glucose tolerance)   . Gout   . Hematuria   . Hypercholesteremia   . Hypertension   .  Kidney stones, calcium oxalate   . Lung cancer ([place])   . Myocardial infarction ([place]) 1990   "I have had a few heart attcks, last 1990".  . Prostate cancer [facility]) 2009   Dr. Eulogio Ditch; tx w/ radium implants  . Pulmonary embolus ([place])   . S/P IVC filter     Patient Active Problem List   Diagnosis Date Noted  . Urinary retention 08/13/2014  . S/P IVC filter 08/13/2014  . Hydronephrosis, right 07/27/2014  . Chronic anticoagulation 07/27/2014  . Acute blood loss anemia 07/27/2014  . Acute renal failure ([place]) 07/27/2014  . Loss of weight 07/27/2014  . Obesity (BMI 30-39.9) 07/27/2014  . Hematuria 07/26/2014  . Embolism, pulmonary with infarction ([place]) 07/15/2014  . Rheumatoid arthritis ([place]) 07/15/2014  . HNP (herniated nucleus pulposus), lumbar 08/10/2012  . AKI (acute kidney injury) with ATN 02/09/2012  . Hypertension   . Hypercholesteremia   . Glucose intolerance (impaired glucose tolerance)   . Kidney stones, calcium oxalate   . CAD (coronary artery disease)   . Adenosquamous carcinoma of lung ([place])   . Adenomatous colon polyp   . Prostate cancer [facility])     Past Surgical History:  Procedure Laterality Date  . CARDIAC CATHETERIZATION  1997   Dr. Martinique  . CATARACT EXTRACTION W/PHACO Left 12/10/2015   Procedure: CATARACT EXTRACTION PHACO AND INTRAOCULAR LENS PLACEMENT LEFT EYE CDE=10.98;  Surgeon: Tonny Branch, MD;  Location: [facility] ORS;  Service: Ophthalmology;  Laterality: Left;  . CATARACT EXTRACTION W/PHACO Right 01/07/2016   Procedure: CATARACT EXTRACTION PHACO AND INTRAOCULAR LENS PLACEMENT (IOC);  Surgeon: Tonny Branch, MD;  Location: [facility] ORS;  Service: Ophthalmology;  Laterality: Right;  CDE:15.11  . COLONOSCOPY  ;11/12/99;04/29/03   12/27/09  . CYSTOSCOPY WITH RETROGRADE PYELOGRAM, URETEROSCOPY AND STENT PLACEMENT Right 07/31/2014   Procedure: CYSTOSCOPY WITH RETROGRADE PYELOGRAM, URETEROSCOPY WITH FULGURATION AND STENT PLACEMENT;  Surgeon: [name], MD;  Location: [facility]  ORS;  Service: Urology;  Laterality: Right;  . LOBECTOMY  1999   left - Dr. Cyndia Bent  . LUMBAR LAMINECTOMY/DECOMPRESSION MICRODISCECTOMY  08/10/2012   Procedure: LUMBAR LAMINECTOMY/DECOMPRESSION MICRODISCECTOMY 1 LEVEL;  Surgeon: Winfield Cunas, MD;  Location: [facility] ORS;  Service: Neurosurgery;  Laterality: Left;  LEFT Lumbar five-sacral one microdiskectomy  . LUNG CANCER SURGERY  1999  . prostate radium implants  2009  . repair right testicular torsion  1962  . VEIN LIGATION AND STRIPPING Right        Home Medications    Prior to Admission medications   Medication Sig Start Date End Date Taking? Authorizing Provider  allopurinol (ZYLOPRIM) 100 MG tablet Take 100 mg by mouth Daily. 07/23/12   [provider]  ALPRAZolam Duanne Moron) 0.5 MG tablet Take 0.5 mg by mouth 3 (three) times daily as needed for anxiety (anxiety).     [provider]  aspirin EC 81 MG tablet Take 81 mg by mouth every other day.    [provider]  atorvastatin (LIPITOR) 40 MG tablet Take 1 tablet (40 mg total) by mouth every morning. 04/30/15   Martinique, Peter M, MD  Cyanocobalamin (VITAMIN B 12 PO) Take 1,000 mcg by mouth daily.      [provider]  diphenhydrAMINE (BENADRYL) 25 mg capsule Take 1 capsule (25 mg total) by mouth every 8 (eight) hours as needed for itching. 02/24/16   Horton, Barbette Hair, MD  doxycycline (VIBRA-TABS) 100 MG tablet Take 1 tablet (100 mg total) by mouth 2 (two) times daily. 02/24/16   Horton, Barbette Hair, MD  finasteride (PROSCAR) 5 MG tablet Take 5 mg by mouth daily.    [provider]  glucosamine-chondroitin 500-400 MG tablet Take 1 tablet by mouth 2 (two) times daily.     [provider]  metFORMIN (GLUCOPHAGE-XR) 500 MG 24 hr tablet Take 500 mg by mouth daily. 02/04/16   [provider]  nitroGLYCERIN (NITROSTAT) 0.4 MG SL tablet Place 0.4 mg under the tongue every 5 (five) minutes as needed. For chest pain    [provider]  verapamil (CALAN-SR) 240 MG CR tablet Take 240 mg by mouth 2 (two) times daily.    [provider]    Family History Family History  Problem Relation Age of Onset  . Heart disease Mother   . Hypertension Mother   . Heart disease Father   . Hypertension Father   . Stroke Father   . Other Brother        spinal meningitis  . Other Brother        MVA  . Cancer Sister        stomach    Social History Social History  Substance Use Topics  . Smoking status: Former Smoker    Packs/day: 2.00    Years: 35.00    Types: Cigarettes    Quit date: 10/17/1982  . Smokeless tobacco: Former Systems developer    Types: Chew    Quit date: 03/15/2012  . Alcohol use  No     Allergies   Codeine and Ibuprofen   Review of Systems Review of Systems  Constitutional: Negative for chills and fever.  Respiratory: Negative for cough, chest tightness and shortness of breath.   Cardiovascular: Negative for chest pain, palpitations and leg swelling.  Gastrointestinal: Positive for abdominal pain and constipation. Negative for abdominal distention, diarrhea, nausea and vomiting.  Genitourinary: Positive for frequency and hematuria. Negative for difficulty urinating, dysuria and urgency.  Musculoskeletal: Negative for arthralgias, myalgias, neck pain and neck stiffness.  Skin: Negative for rash.  Allergic/Immunologic: Negative for immunocompromised state.  Neurological: Negative for dizziness, weakness, light-headedness, numbness and headaches.  All other systems reviewed and are negative.    Physical Exam Updated Vital Signs BP (!) 211/89 (BP Location: Left Arm)   Pulse 68   Temp 97.8 F (36.6 C) (Oral)   Resp 17   Ht 5\' 4"  (1.626 m)   Wt 88.9 kg (196 lb)   SpO2 98%   BMI 33.64 kg/m   Physical Exam  Constitutional: He is oriented to person, place, and time. He appears well-developed and well-nourished. No distress.  HENT:  Head: Normocephalic and atraumatic.  Eyes:  Conjunctivae are normal.  Neck: Neck supple.  Cardiovascular: Normal rate, regular rhythm and normal heart sounds.   Pulmonary/Chest: Effort normal. No respiratory distress. He has no wheezes. He has no rales.  Abdominal: Soft. Bowel sounds are normal. He exhibits no distension. There is tenderness. There is no rebound.  Diffuse tenderness  Musculoskeletal: He exhibits no edema.  Neurological: He is alert and oriented to person, place, and time.  Skin: Skin is warm and dry.  Nursing note and vitals reviewed.    ED Treatments / Results  Labs (all labs ordered are listed, but only abnormal results are displayed) Labs Reviewed  COMPREHENSIVE METABOLIC PANEL - Abnormal; Notable for the following:       Result Value   Glucose, Bld 107 (*)    BUN 29 (*)    Creatinine, Ser 2.00 (*)    ALT 13 (*)    GFR calc non Af Amer 31 (*)    GFR calc Af Amer 36 (*)    All other components within normal limits  CBC - Abnormal; Notable for the following:    RBC 3.96 (*)    Hemoglobin 12.9 (*)    HCT 37.0 (*)    All other components within normal limits  LIPASE, BLOOD  URINALYSIS, ROUTINE W REFLEX MICROSCOPIC    EKG  EKG Interpretation None       Radiology No results found.  Procedures Procedures (including critical care time)  Medications Ordered in ED Medications  iopamidol (ISOVUE-300) 61 % injection 30 mL (30 mLs Oral Contrast Given 05/15/17 1424)     Initial Impression / Assessment and Plan / ED Course  I have reviewed the triage vital signs and the nursing notes.  Pertinent labs & imaging results that were available during my care of the patient were reviewed by me and considered in my medical decision making (see chart for details).     Pt in ED with diffuse abdominal pain for two weeks. Possibly constipation? Some urinary urgency and hematuria, although apparently negative UA by PCP and urology. Will check urinalysis, labs, will get CT abdomen and pelvis for further  evaluation. Patient is nontoxic appearing. Appears to be comfortable. Blood pressure is elevated, will recheck.   4:12 PM Pt does not want any pain meds. Creat up to a 2 from  prior of 1. Pt's BP elevated. He is currently not on any BP meds. He used to be on verapamil but was taken off of it. States he does not have HTN. States "it is from pain." Will continue to monitor.   Signed out at shift change pending CT abd/pelvis  Final Clinical Impressions(s) / ED Diagnoses   Final diagnoses:  None    New Prescriptions New Prescriptions   No medications on file     Jeannett Senior, PA-C 05/16/17 0800    Sherwood Gambler, MD 05/16/17 [phone]

## 2017-05-15 NOTE — Discharge Instructions (Signed)
You have been evaluated for your abdominal pain. Your ureter and your bladder is enlarge.  Please call and follow up closely with your urologist for further evaluation.  Your blood pressure is high today.  Have it recheck by your primary care provider in the next few days.  Take your blood pressure medication verapamil as previously prescribed. Return if you have any concerns.

## 2017-05-15 NOTE — ED Notes (Signed)
Pt given urinal for urine sample. Will do bladder scan after sample obtain. Pt updated about plan of care.

## 2017-05-17 DIAGNOSIS — Z7984 Long term (current) use of oral hypoglycemic drugs: Secondary | ICD-10-CM | POA: Diagnosis not present

## 2017-05-17 DIAGNOSIS — Z6829 Body mass index (BMI) 29.0-29.9, adult: Secondary | ICD-10-CM | POA: Diagnosis not present

## 2017-05-17 DIAGNOSIS — M109 Gout, unspecified: Secondary | ICD-10-CM | POA: Diagnosis not present

## 2017-05-17 DIAGNOSIS — E785 Hyperlipidemia, unspecified: Secondary | ICD-10-CM | POA: Diagnosis not present

## 2017-05-17 DIAGNOSIS — F419 Anxiety disorder, unspecified: Secondary | ICD-10-CM | POA: Diagnosis not present

## 2017-05-17 DIAGNOSIS — R1030 Lower abdominal pain, unspecified: Secondary | ICD-10-CM | POA: Diagnosis not present

## 2017-05-17 DIAGNOSIS — N481 Balanitis: Secondary | ICD-10-CM | POA: Diagnosis not present

## 2017-05-17 DIAGNOSIS — I10 Essential (primary) hypertension: Secondary | ICD-10-CM | POA: Diagnosis not present

## 2017-05-17 DIAGNOSIS — N3091 Cystitis, unspecified with hematuria: Secondary | ICD-10-CM | POA: Diagnosis not present

## 2017-05-17 DIAGNOSIS — E669 Obesity, unspecified: Secondary | ICD-10-CM | POA: Diagnosis not present

## 2017-05-17 DIAGNOSIS — E1165 Type 2 diabetes mellitus with hyperglycemia: Secondary | ICD-10-CM | POA: Diagnosis not present

## 2017-05-17 DIAGNOSIS — C61 Malignant neoplasm of prostate: Secondary | ICD-10-CM | POA: Diagnosis not present

## 2017-05-17 DIAGNOSIS — I2510 Atherosclerotic heart disease of native coronary artery without angina pectoris: Secondary | ICD-10-CM | POA: Diagnosis not present

## 2017-05-17 DIAGNOSIS — I700 Atherosclerosis of aorta: Secondary | ICD-10-CM | POA: Diagnosis not present

## 2017-05-23 ENCOUNTER — Encounter (HOSPITAL_BASED_OUTPATIENT_CLINIC_OR_DEPARTMENT_OTHER): Payer: Self-pay | Admitting: *Deleted

## 2017-05-23 ENCOUNTER — Other Ambulatory Visit: Payer: Self-pay | Admitting: Urology

## 2017-05-23 NOTE — Progress Notes (Signed)
NPO AFTER MN.  ARRIVE AT 0900.  CURRENT LAB RESULTS AND EKG IN CHART AND EPIC.  WILL TAKE VERAPAMIL AND LIPITOR AM DOS W/ SIPS OF WATER.

## 2017-05-24 ENCOUNTER — Ambulatory Visit (HOSPITAL_BASED_OUTPATIENT_CLINIC_OR_DEPARTMENT_OTHER): Payer: Medicare Other | Admitting: Anesthesiology

## 2017-05-24 ENCOUNTER — Ambulatory Visit (HOSPITAL_COMMUNITY)
Admission: RE | Admit: 2017-05-24 | Discharge: 2017-05-24 | Disposition: A | Discharge status: Home / Self Care | Payer: Medicare Other | Source: Ambulatory Visit | Attending: Urology | Admitting: Urology

## 2017-05-24 ENCOUNTER — Encounter (HOSPITAL_COMMUNITY): Payer: Self-pay

## 2017-05-24 ENCOUNTER — Encounter (HOSPITAL_COMMUNITY)
Admission: RE | Disposition: A | Discharge status: Home / Self Care | Payer: Self-pay | Source: Ambulatory Visit | Attending: Urology

## 2017-05-24 DIAGNOSIS — N179 Acute kidney failure, unspecified: Secondary | ICD-10-CM | POA: Insufficient documentation

## 2017-05-24 DIAGNOSIS — Z8249 Family history of ischemic heart disease and other diseases of the circulatory system: Secondary | ICD-10-CM | POA: Diagnosis not present

## 2017-05-24 DIAGNOSIS — N1330 Unspecified hydronephrosis: Secondary | ICD-10-CM | POA: Diagnosis not present

## 2017-05-24 DIAGNOSIS — Z886 Allergy status to analgesic agent status: Secondary | ICD-10-CM | POA: Insufficient documentation

## 2017-05-24 DIAGNOSIS — Z923 Personal history of irradiation: Secondary | ICD-10-CM | POA: Insufficient documentation

## 2017-05-24 DIAGNOSIS — Z8546 Personal history of malignant neoplasm of prostate: Secondary | ICD-10-CM | POA: Insufficient documentation

## 2017-05-24 DIAGNOSIS — N138 Other obstructive and reflux uropathy: Secondary | ICD-10-CM | POA: Diagnosis not present

## 2017-05-24 DIAGNOSIS — E119 Type 2 diabetes mellitus without complications: Secondary | ICD-10-CM | POA: Diagnosis not present

## 2017-05-24 DIAGNOSIS — Z466 Encounter for fitting and adjustment of urinary device: Secondary | ICD-10-CM | POA: Diagnosis not present

## 2017-05-24 DIAGNOSIS — I2510 Atherosclerotic heart disease of native coronary artery without angina pectoris: Secondary | ICD-10-CM | POA: Diagnosis not present

## 2017-05-24 DIAGNOSIS — Z86711 Personal history of pulmonary embolism: Secondary | ICD-10-CM | POA: Insufficient documentation

## 2017-05-24 DIAGNOSIS — M069 Rheumatoid arthritis, unspecified: Secondary | ICD-10-CM | POA: Insufficient documentation

## 2017-05-24 DIAGNOSIS — M1A9XX0 Chronic gout, unspecified, without tophus (tophi): Secondary | ICD-10-CM | POA: Diagnosis not present

## 2017-05-24 DIAGNOSIS — Z885 Allergy status to narcotic agent status: Secondary | ICD-10-CM | POA: Diagnosis not present

## 2017-05-24 DIAGNOSIS — Z87442 Personal history of urinary calculi: Secondary | ICD-10-CM | POA: Diagnosis not present

## 2017-05-24 DIAGNOSIS — E7800 Pure hypercholesterolemia, unspecified: Secondary | ICD-10-CM | POA: Insufficient documentation

## 2017-05-24 DIAGNOSIS — Z85118 Personal history of other malignant neoplasm of bronchus and lung: Secondary | ICD-10-CM | POA: Insufficient documentation

## 2017-05-24 DIAGNOSIS — Z87891 Personal history of nicotine dependence: Secondary | ICD-10-CM | POA: Insufficient documentation

## 2017-05-24 DIAGNOSIS — I10 Essential (primary) hypertension: Secondary | ICD-10-CM | POA: Diagnosis not present

## 2017-05-24 DIAGNOSIS — C670 Malignant neoplasm of trigone of bladder: Secondary | ICD-10-CM | POA: Insufficient documentation

## 2017-05-24 DIAGNOSIS — N401 Enlarged prostate with lower urinary tract symptoms: Secondary | ICD-10-CM | POA: Diagnosis not present

## 2017-05-24 DIAGNOSIS — I252 Old myocardial infarction: Secondary | ICD-10-CM | POA: Diagnosis not present

## 2017-05-24 DIAGNOSIS — Z86010 Personal history of colonic polyps: Secondary | ICD-10-CM | POA: Insufficient documentation

## 2017-05-24 HISTORY — DX: Personal history of other specified conditions: Z87.898

## 2017-05-24 HISTORY — DX: Nocturia: R35.1

## 2017-05-24 HISTORY — DX: Presence of spectacles and contact lenses: Z97.3

## 2017-05-24 HISTORY — DX: Other specified abnormal findings of blood chemistry: R79.89

## 2017-05-24 HISTORY — DX: Dysuria: R30.0

## 2017-05-24 HISTORY — DX: Rheumatoid arthritis, unspecified: M06.9

## 2017-05-24 HISTORY — DX: Presence of dental prosthetic device (complete) (partial): Z97.2

## 2017-05-24 HISTORY — DX: Personal history of other malignant neoplasm of bronchus and lung: Z85.118

## 2017-05-24 HISTORY — DX: Unspecified hydronephrosis: N13.30

## 2017-05-24 HISTORY — DX: Complete loss of teeth, unspecified cause, unspecified class: K08.109

## 2017-05-24 HISTORY — DX: Personal history of colonic polyps: Z86.010

## 2017-05-24 HISTORY — DX: Old myocardial infarction: I25.2

## 2017-05-24 HISTORY — PX: CYSTOSCOPY W/ URETERAL STENT PLACEMENT: SHX1429

## 2017-05-24 HISTORY — DX: Disorder of kidney and ureter, unspecified: N28.9

## 2017-05-24 HISTORY — DX: Personal history of urinary calculi: Z87.442

## 2017-05-24 HISTORY — DX: Personal history of adenomatous and serrated colon polyps: Z86.0101

## 2017-05-24 HISTORY — DX: Personal history of malignant neoplasm of prostate: Z85.46

## 2017-05-24 HISTORY — DX: Unspecified osteoarthritis, unspecified site: M19.90

## 2017-05-24 HISTORY — DX: Personal history of pneumonia (recurrent): Z87.01

## 2017-05-24 HISTORY — DX: Personal history of other diseases of the respiratory system: Z87.09

## 2017-05-24 HISTORY — PX: CYSTOSCOPY WITH BIOPSY: SHX5122

## 2017-05-24 HISTORY — DX: Chronic gout, unspecified, without tophus (tophi): M1A.9XX0

## 2017-05-24 HISTORY — DX: Personal history of pulmonary embolism: Z86.711

## 2017-05-24 HISTORY — DX: Type 2 diabetes mellitus without complications: E11.9

## 2017-05-24 LAB — GLUCOSE, CAPILLARY
GLUCOSE-CAPILLARY: 119 mg/dL — AB (ref 65–99)
Glucose-Capillary: 106 mg/dL — ABNORMAL HIGH (ref 65–99)

## 2017-05-24 SURGERY — CYSTOSCOPY, WITH RETROGRADE PYELOGRAM AND URETERAL STENT INSERTION
Anesthesia: General | Site: Bladder

## 2017-05-24 MED ORDER — OXYBUTYNIN CHLORIDE 5 MG PO TABS: 5.0000 mg | ORAL_TABLET | Freq: Three times a day (TID) | ORAL | 1 refills | Status: DC | PRN

## 2017-05-24 MED ORDER — FENTANYL CITRATE (PF) 100 MCG/2ML IJ SOLN
25.0000 ug | INTRAMUSCULAR | Status: DC | PRN
  Administered 2017-05-24: 25 ug via INTRAVENOUS

## 2017-05-24 MED ORDER — LIDOCAINE 2% (20 MG/ML) 5 ML SYRINGE
INTRAMUSCULAR | Status: DC | PRN
  Administered 2017-05-24: 80 mg via INTRAVENOUS

## 2017-05-24 MED ORDER — CEFAZOLIN SODIUM-DEXTROSE 2-3 GM-% IV SOLR
INTRAVENOUS | Status: DC | PRN
  Administered 2017-05-24: 2 g via INTRAVENOUS

## 2017-05-24 MED ORDER — TRAMADOL HCL 50 MG PO TABS: 50.0000 mg | ORAL_TABLET | Freq: Four times a day (QID) | ORAL | 0 refills | Status: DC | PRN

## 2017-05-24 MED ORDER — FENTANYL CITRATE (PF) 100 MCG/2ML IJ SOLN
INTRAMUSCULAR | Status: DC | PRN
  Administered 2017-05-24: 25 ug via INTRAVENOUS
  Administered 2017-05-24: 50 ug via INTRAVENOUS
  Administered 2017-05-24: 25 ug via INTRAVENOUS

## 2017-05-24 MED ORDER — ONDANSETRON HCL 4 MG/2ML IJ SOLN
INTRAMUSCULAR | Status: DC | PRN
  Administered 2017-05-24: 4 mg via INTRAVENOUS

## 2017-05-24 MED ORDER — OXYBUTYNIN CHLORIDE 5 MG PO TABS: 5.0000 mg | ORAL_TABLET | Freq: Once | ORAL | Status: DC

## 2017-05-24 MED ORDER — CEFAZOLIN SODIUM-DEXTROSE 2-4 GM/100ML-% IV SOLN: 2.0000 g | INTRAVENOUS | Status: DC

## 2017-05-24 MED ORDER — DEXAMETHASONE SODIUM PHOSPHATE 10 MG/ML IJ SOLN
INTRAMUSCULAR | Status: AC
  Filled 2017-05-24: qty 1

## 2017-05-24 MED ORDER — SODIUM CHLORIDE 0.9 % IV SOLN
INTRAVENOUS | Status: DC
  Administered 2017-05-24: 10:00:00 via INTRAVENOUS

## 2017-05-24 MED ORDER — FENTANYL CITRATE (PF) 100 MCG/2ML IJ SOLN
INTRAMUSCULAR | Status: AC
  Filled 2017-05-24: qty 2

## 2017-05-24 MED ORDER — OXYBUTYNIN CHLORIDE 5 MG PO TABS
5.0000 mg | ORAL_TABLET | Freq: Three times a day (TID) | ORAL | Status: DC
  Administered 2017-05-24: 5 mg via ORAL

## 2017-05-24 MED ORDER — OXYBUTYNIN CHLORIDE 5 MG PO TABS
ORAL_TABLET | ORAL | Status: AC
  Filled 2017-05-24: qty 1

## 2017-05-24 MED ORDER — CEFAZOLIN SODIUM-DEXTROSE 2-4 GM/100ML-% IV SOLN
INTRAVENOUS | Status: AC
  Filled 2017-05-24: qty 100

## 2017-05-24 MED ORDER — DEXAMETHASONE SODIUM PHOSPHATE 10 MG/ML IJ SOLN
INTRAMUSCULAR | Status: DC | PRN
  Administered 2017-05-24: 10 mg via INTRAVENOUS

## 2017-05-24 MED ORDER — ONDANSETRON HCL 4 MG/2ML IJ SOLN
INTRAMUSCULAR | Status: AC
  Filled 2017-05-24: qty 2

## 2017-05-24 MED ORDER — PROPOFOL 10 MG/ML IV BOLUS
INTRAVENOUS | Status: AC
  Filled 2017-05-24: qty 40

## 2017-05-24 MED ORDER — PROPOFOL 10 MG/ML IV BOLUS
INTRAVENOUS | Status: DC | PRN
  Administered 2017-05-24: 140 mg via INTRAVENOUS

## 2017-05-24 MED ORDER — LIDOCAINE 2% (20 MG/ML) 5 ML SYRINGE
INTRAMUSCULAR | Status: AC
Start: 2017-05-24 — End: ?
  Filled 2017-05-24: qty 5

## 2017-05-24 SURGICAL SUPPLY — 47 items
BAG DRAIN URO-CYSTO SKYTR STRL (DRAIN) ×4 IMPLANT
BAG DRN UROCATH (DRAIN) ×2
BAG URINE DRAINAGE (UROLOGICAL SUPPLIES) IMPLANT
BASKET LASER NITINOL 1.9FR (BASKET) IMPLANT
BASKET STNLS GEMINI 4WIRE 3FR (BASKET) IMPLANT
BASKET ZERO TIP NITINOL 2.4FR (BASKET) IMPLANT
BSKT STON RTRVL 120 1.9FR (BASKET)
BSKT STON RTRVL GEM 120X11 3FR (BASKET)
BSKT STON RTRVL ZERO TP 2.4FR (BASKET)
CATH FOLEY 2WAY SLVR  5CC 20FR (CATHETERS) ×2
CATH FOLEY 2WAY SLVR 5CC 20FR (CATHETERS) IMPLANT
CATH INTERMIT  6FR 70CM (CATHETERS) ×4 IMPLANT
CATH URET 5FR 28IN CONE TIP (BALLOONS)
CATH URET 5FR 28IN OPEN ENDED (CATHETERS) IMPLANT
CATH URET 5FR 70CM CONE TIP (BALLOONS) IMPLANT
CLOTH BEACON ORANGE TIMEOUT ST (SAFETY) ×4 IMPLANT
ELECT REM PT RETURN 9FT ADLT (ELECTROSURGICAL) ×4
ELECTRODE REM PT RTRN 9FT ADLT (ELECTROSURGICAL) ×2 IMPLANT
FIBER LASER FLEXIVA 365 (UROLOGICAL SUPPLIES) IMPLANT
FIBER LASER TRAC TIP (UROLOGICAL SUPPLIES) IMPLANT
GLOVE BIO SURGEON STRL SZ8 (GLOVE) ×4 IMPLANT
GOWN STRL REUS W/ TWL LRG LVL3 (GOWN DISPOSABLE) ×2 IMPLANT
GOWN STRL REUS W/ TWL XL LVL3 (GOWN DISPOSABLE) ×2 IMPLANT
GOWN STRL REUS W/TWL LRG LVL3 (GOWN DISPOSABLE) ×4
GOWN STRL REUS W/TWL XL LVL3 (GOWN DISPOSABLE) ×4
GUIDEWIRE 0.038 PTFE COATED (WIRE) IMPLANT
GUIDEWIRE ANG ZIPWIRE 038X150 (WIRE) ×2 IMPLANT
GUIDEWIRE STR DUAL SENSOR (WIRE) ×2 IMPLANT
INFUSOR MANOMETER BAG 3000ML (MISCELLANEOUS) ×4 IMPLANT
IV NS IRRIG 3000ML ARTHROMATIC (IV SOLUTION) ×8 IMPLANT
KIT BALLIN UROMAX 15FX10 (LABEL) IMPLANT
KIT BALLN UROMAX 15FX4 (MISCELLANEOUS) IMPLANT
KIT BALLN UROMAX 26 75X4 (MISCELLANEOUS)
KIT RM TURNOVER CYSTO AR (KITS) ×4 IMPLANT
MANIFOLD NEPTUNE II (INSTRUMENTS) ×2 IMPLANT
NDL SAFETY ECLIPSE 18X1.5 (NEEDLE) IMPLANT
NEEDLE HYPO 18GX1.5 SHARP (NEEDLE)
NEEDLE HYPO 22GX1.5 SAFETY (NEEDLE) IMPLANT
NS IRRIG 500ML POUR BTL (IV SOLUTION) IMPLANT
PACK CYSTO (CUSTOM PROCEDURE TRAY) ×4 IMPLANT
SET HIGH PRES BAL DIL (LABEL)
SHEATH ACCESS URETERAL 38CM (SHEATH) IMPLANT
STENT CONTOUR 7FRX24 (STENTS) ×4 IMPLANT
SYR 20CC LL (SYRINGE) IMPLANT
TUBE CONNECTING 12'X1/4 (SUCTIONS) ×1
TUBE CONNECTING 12X1/4 (SUCTIONS) ×1 IMPLANT
WATER STERILE IRR 3000ML UROMA (IV SOLUTION) ×4 IMPLANT

## 2017-05-24 NOTE — Interval H&P Note (Signed)
History and Physical Interval Note:  05/24/2017 10:12 AM  Daniel Vega  has presented today for surgery, with the diagnosis of BILATERAL HYDRONEPHROSIS  The various methods of treatment have been discussed with the patient and family. After consideration of risks, benefits and other options for treatment, the patient has consented to  Procedure(s) with comments: CYSTOSCOPY WITH RETROGRADE PYELOGRAM/ POSSIBLE URETERAL STENT PLACEMENT (Bilateral) -  [phone] [id number] CYSTOSCOPY WITH BIOPSY (N/A) as a surgical intervention .  The patient's history has been reviewed, patient examined, no change in status, stable for surgery.  I have reviewed the patient's chart and labs.  Questions were answered to the patient's satisfaction.     [name]

## 2017-05-24 NOTE — Transfer of Care (Signed)
Immediate Anesthesia Transfer of Care Note  Patient: Daniel Vega  Procedure(s) Performed: Procedure(s) with comments: CYSTOSCOPY WITH RETROGRADE PYELOGRAM/ POSSIBLE URETERAL STENT PLACEMENT (Bilateral) -  [phone] [id number] CYSTOSCOPY WITH BIOPSY (N/A)  Patient Location: PACU  Anesthesia Type:General  Level of Consciousness: sedated and drowsy  Airway & Oxygen Therapy: Patient Spontanous Breathing and Patient connected to nasal cannula oxygen  Post-op Assessment: Report given to RN  Post vital signs: Reviewed and stable  Last Vitals: 147/67, 61, 15, 100%,  Vitals:   05/24/17 0858  BP: (!) 188/78  Pulse: 79  Resp: 16  Temp: 36.5 C    Last Pain:  Vitals:   05/24/17 0918  TempSrc:   PainSc: 5       Patients Stated Pain Goal: 8 (29/09/03 0149)  Complications: No apparent anesthesia complications

## 2017-05-24 NOTE — Anesthesia Procedure Notes (Signed)
Procedure Name: LMA Insertion Date/Time: 05/24/2017 10:18 AM Performed by: Bethena Roys T Pre-anesthesia Checklist: Patient identified, Emergency Drugs available, Suction available and Patient being monitored Patient Re-evaluated:Patient Re-evaluated prior to induction Oxygen Delivery Method: Circle system utilized Preoxygenation: Pre-oxygenation with 100% oxygen Induction Type: IV induction Ventilation: Mask ventilation without difficulty LMA: LMA inserted LMA Size: 5.0 Number of attempts: 1 Airway Equipment and Method: Bite block Placement Confirmation: positive ETCO2 Tube secured with: Tape Dental Injury: Teeth and Oropharynx as per pre-operative assessment

## 2017-05-24 NOTE — Anesthesia Preprocedure Evaluation (Addendum)
Anesthesia Evaluation  Patient identified by MRN, date of birth, ID band Patient awake    Reviewed: Allergy & Precautions, NPO status , Patient's Chart, lab work & pertinent test results  Airway Mallampati: II  TM Distance: <3 FB Neck ROM: Full    Dental  (+) Edentulous Upper, Edentulous Lower   Pulmonary former smoker,    breath sounds clear to auscultation       Cardiovascular hypertension, + CAD   Rhythm:Regular Rate:Normal     Neuro/Psych    GI/Hepatic   Endo/Other  diabetes  Renal/GU Renal InsufficiencyRenal disease     Musculoskeletal  (+) Arthritis ,   Abdominal (+) + obese,   Peds  Hematology   Anesthesia Other Findings   Reproductive/Obstetrics                            Anesthesia Physical Anesthesia Plan  ASA: III  Anesthesia Plan: General   Post-op Pain Management:    Induction: Intravenous  PONV Risk Score and Plan: 3 and Ondansetron and Dexamethasone  Airway Management Planned: LMA  Additional Equipment:   Intra-op Plan:   Post-operative Plan: Extubation in OR  Informed Consent: I have reviewed the patients History and Physical, chart, labs and discussed the procedure including the risks, benefits and alternatives for the proposed anesthesia with the patient or authorized representative who has indicated his/her understanding and acceptance.     Plan Discussed with: CRNA  Anesthesia Plan Comments:        Anesthesia Quick Evaluation

## 2017-05-24 NOTE — Discharge Instructions (Signed)
°  1. You may see some blood in the urine and may have some burning with urination for 48-72 hours. You also may notice that you have to urinate more frequently or urgently after your procedure which is normal.  2. You should call should you develop an inability urinate, fever > 101, persistent nausea and vomiting that prevents you from eating or drinking to stay hydrated.  3. If you have a stent, you will likely urinate more frequently and urgently until the stent is removed and you may experience some discomfort/pain in the lower abdomen and flank especially when urinating. You may take pain medication prescribed to you if needed for pain. You may also intermittently have blood in the urine until the stent is removed. 4. If you have a catheter, you will be taught how to take care of the catheter by the nursing staff prior to discharge from the hospital.  You may periodically feel a strong urge to void with the catheter in place.  This is a bladder spasm and most often can occur when having a bowel movement or moving around. It is typically self-limited and usually will stop after a few minutes.  You may use some Vaseline or Neosporin around the tip of the catheter to reduce friction at the tip of the penis. You may also see some blood in the urine.  A very small amount of blood can make the urine look quite red.  As long as the catheter is draining well, there usually is not a problem.  However, if the catheter is not draining well and is bloody, you should call the office [phone]) to notify us.     Post Anesthesia Home Care Instructions  Activity: Get plenty of rest for the remainder of the day. A responsible individual must stay with you for 24 hours following the procedure.  For the next 24 hours, DO NOT: -Drive a car -Paediatric nurse -Drink alcoholic beverages -Take any medication unless instructed by your physician -Make any legal decisions or sign important papers.  Meals: Start  with liquid foods such as gelatin or soup. Progress to regular foods as tolerated. Avoid greasy, spicy, heavy foods. If nausea and/or vomiting occur, drink only clear liquids until the nausea and/or vomiting subsides. Call your physician if vomiting continues.  Special Instructions/Symptoms: Your throat may feel dry or sore from the anesthesia or the breathing tube placed in your throat during surgery. If this causes discomfort, gargle with warm salt water. The discomfort should disappear within 24 hours.  If you had a scopolamine patch placed behind your ear for the management of post- operative nausea and/or vomiting:  1. The medication in the patch is effective for 72 hours, after which it should be removed.  Wrap patch in a tissue and discard in the trash. Wash hands thoroughly with soap and water. 2. You may remove the patch earlier than 72 hours if you experience unpleasant side effects which may include dry mouth, dizziness or visual disturbances. 3. Avoid touching the patch. Wash your hands with soap and water after contact with the patch.

## 2017-05-24 NOTE — Op Note (Signed)
Preoperative diagnosis: History of prostate cancer, bilateral hydronephrosis, acute renal failure.  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  Principal procedure: Cystoscopy, TUR bladder neck/TUIP, bilateral retrograde ureteropyelograms, bilateral fluoroscopic interpretation, placement of bilateral double-J stents (24 centimeter by 7 [place] without tether)  Surgeon: Zamani Crocker  Anesthesia: Gen.  Complications: None.  Estimated blood loss: Less than 25 milliliters  Specimen: Biopsy of trigone, for permanent pathology  Drains: 20 French Foley catheter, bilateral double-J stents as described above.  Indications: 76 year old male who is several years out from radiotherapy of the prostate for adenocarcinoma.  The patient has had no evidence of recurrence.  He has had intermittent issues with gross hematuria.  He recently presented with vague abdominal complaints which worsened.  He was having mild hematuria.  Because of his abdominal complaints, he had CT of the abdomen and pelvis in late July, revealing bilateral hydroureteronephrosis down to the bladder.  The patient presents at this time, with a creatinine of approximately 2, for cystoscopy, retrogrades, bilateral stent placement and possible bladder biopsy, if indicated.  The procedure as well as risks and complications have been discussed with the patient and his family.  They understand these and desire to proceed.  Findings: Cystoscopically.  There was significant abnormality of the bladder neck and trigone, with the ureteral orifice sees obscured.  I a nodular, somewhat infiltrative process posteriorly in the trigonal region.  There were no remaining bladder abnormalities.  The prostate was nonobstructive.  Bilateral retrograde ureteropyelograms revealed significant hydroureteronephrosis down to the ureterovesical junction.  There was tortuosity of the  left ureter.  Because of significant hydronephrosis, the pyelo-calyceal systems were not distinctly imaged, but there were no filling defects in either ureter.  Description of procedure: The patient was properly identified in the holding area.  He received preoperative IV antibiotics previous taken the operating room where general anesthesia was administered.  He was placed in the dorsolithotomy position.  Genitalia and perineum were prepped and draped.  Proper timeout was performed.  A 21 French panendoscope was advanced under direct vision using the 12 lens through the urethra and into the prostate.  There were no urethral lesions.  Prostate was nonobstructive.  There was a generalized process in the patient's trigonal region with some cystic change, but some nodular changes well.  This could certainly be secondary to the radiotherapy, but I was worried about an infiltrative/neoplastic process.  The remaining bladder was normal. Using the 70 lens in the [place] bridge, I was eventually able to cannulate both ureteral orifices, even though they were obscured an invisible.  First, the right orifice was , cannulated with a 6 [place] open-ended catheter with the assistance of the sensor-tip guidewire.  Once found to be within the ureter, retrograde ureteropyelogram was performed with the findings noted above.  Following this, the guidewire was advanced using fluoroscopic guidance and the upper pole calyx where a curl was seen.  The open-ended catheter was removed.  I then placed a 24 centimeter by 7 [place] contour double-J stent with adequate positioning seen once the wire was removed. At this point, a similar procedure was performed on the left.  Again, I have to say that it was fair amount of blood.  It allowed me to find the ureteral orifice.  Retrograde was performed.  There was tortuosity of the left ureter, so I needed a zip wire to assist with negotiating the ureter.  Once the guidewire was advanced all the  way up in the upper pole, the open-ended catheter was removed.  I then placed the 7 by 24 contour stent, again with the tether removed.  Excellent positioning was seen fluoroscopically and cystoscopically.  At this point, the cystoscope was removed.  I then used the 26 French resectoscope sheath, placed using the visual obturator, 2 end of the bladder.  The resectoscope element and small loop were placed.  I performed.  Deep biopsies of the trigone, medial to the ureteral orifice ease, as well as lateral to the left ureteral orifice, where the most abundant abnormal tissue was seen.  The chips were sent for pathology.  I then cauterized the resected area until adequate hemostasis was achieved.  At this point, the scope was removed.  A 20 French Foley catheter was placed, the balloon filled with 10 mL of water, and this was hooked to dependent drainage.  At this point, the procedure was terminated.  The patient was awakened, taken to the PACU in stable condition.  He tolerated the procedure well.

## 2017-05-24 NOTE — H&P (Signed)
H&P  Chief Complaint: Blood in urine, blocked kidneys  History of Present Illness: 76 year old male w/ history of PCa, s/p EBRT in the past presents for cystoscopy, bilateral rgp's and possible stents bilaterally for newly found bilateral hydronephrosis and associated ARF (baseline SCr 0.8, recently 2.0).  Past Medical History:  Diagnosis Date  . CAD (coronary artery disease)    CARDIOLOGIST-  DR Martinique-- hx MI 1994, medically treated;  cath 1997 dRCA occlusion w/ collateral flow  . Chronic gout    as of 05-23-2017 -- per pt stable   . Dysuria   . Elevated ferritin level    followed by dr Alen Blew ([facility])-- per last note differiential dx hx alcohol use versus questionable hemochromatosis  . Full dentures   . Hematuria    chronic  . History of adenomatous polyp of colon   . History of kidney stones    multiple-  calcium oxalate  . History of MI (myocardial infarction)    remote hx 1994-- treated medically  . History of pleural effusion 07/2014   in setting acute blood loss taking xarelto for PE--- s/p  right pleural thoracentesis w/ 600cc  . History of prostate cancer    urologist-  dr Diona Fanti---  primary adenocarcinoma-- Gleason 3+3--  06-27-2008  s/p  radioactive prostate seed implants  . History of pulmonary embolism 07/14/2014   w/  RLL pulmary infarction  . History of pulmonary infarction 07/14/2014   RLL due to PE  . History of recurrent pneumonia   . History of urinary retention    10/ 2015 gross hematuria / clots due to taking xarelto  . Hydronephrosis of right kidney   . Hypercholesteremia   . Hypertension   . Nocturia   . OA (osteoarthritis)    joints  . Personal history of lung cancer 05/1998   adenosquamous left lower lobe primary carcinoma--- s/p left lower lobectomy 08/ 1999 /  per last CT no recurrence  . RA (rheumatoid arthritis) (HCC)    hands  . Renal insufficiency   . S/P IVC filter placed 07-28-2014  . Type 2 diabetes mellitus ([place])   .  Wears glasses     Past Surgical History:  Procedure Laterality Date  . CARDIAC CATHETERIZATION  1997  dr Martinique  . CARDIAC CATHETERIZATION  02-11-2011  dr Martinique   (abnormal stress echo) single vessel occlusive artherosclerotic CAD involving the distal RCA w/ excellent collateral flow;  good LVSF, ef 55%  . CATARACT EXTRACTION W/PHACO Left 12/10/2015   Procedure: CATARACT EXTRACTION PHACO AND INTRAOCULAR LENS PLACEMENT LEFT EYE CDE=10.98;  Surgeon: Tonny Branch, MD;  Location: [facility];  Service: Ophthalmology;  Laterality: Left;  . CATARACT EXTRACTION W/PHACO Right 01/07/2016   Procedure: CATARACT EXTRACTION PHACO AND INTRAOCULAR LENS PLACEMENT (IOC);  Surgeon: Tonny Branch, MD;  Location: [facility];  Service: Ophthalmology;  Laterality: Right;  CDE:15.11  . COLONOSCOPY  last one 2016 approx.  . CYSTOSCOPY WITH RETROGRADE PYELOGRAM, URETEROSCOPY AND STENT PLACEMENT Right 07/31/2014   Procedure: CYSTOSCOPY WITH RETROGRADE PYELOGRAM, URETEROSCOPY WITH FULGURATION AND STENT PLACEMENT;  Surgeon: [name], MD;  Location: [facility];  Service: Urology;  Laterality: Right;  . IVC FILTER INSERTION  07/28/2014  . LUMBAR LAMINECTOMY/DECOMPRESSION MICRODISCECTOMY  08/10/2012   Procedure: LUMBAR LAMINECTOMY/DECOMPRESSION MICRODISCECTOMY 1 LEVEL;  Surgeon: Winfield Cunas, MD;  Location: [place] [facility];  Service: Neurosurgery;  Laterality: Left;  LEFT Lumbar five-sacral one microdiskectomy  . LUNG LOBECTOMY Left 06-11-1998   dr Cyndia Bent  LLL  . RADIOACTIVE PROSTATE SEED IMPLANTS  06-27-2008  dr Diona Fanti  at [facility]  . TESTICLE TORSION REDUCTION Right 1962  . TRANSTHORACIC ECHOCARDIOGRAM  07/15/2014   severe concentrial LVH, ef 16-10%, grade 1 diastolic dysfunction/  mild TR/  PASP 59mmHg  . VEIN LIGATION AND STRIPPING Right     Home Medications:  Allergies as of 05/24/2017      Reactions   Codeine Palpitations   Made heart hurt   Ibuprofen Palpitations      Medication List    Notice   Cannot display  discharge medications because the patient has not yet been admitted.     Allergies:  Allergies  Allergen Reactions  . Codeine Palpitations    Made heart hurt  . Ibuprofen Palpitations    Family History  Problem Relation Age of Onset  . Heart disease Mother   . Hypertension Mother   . Heart disease Father   . Hypertension Father   . Stroke Father   . Other Brother        spinal meningitis  . Other Brother        MVA  . Cancer Sister        stomach    Social History:  reports that he quit smoking about 34 years ago. His smoking use included Cigarettes. He has a 70.00 pack-year smoking history. He quit smokeless tobacco use about 5 years ago. His smokeless tobacco use included Chew. He reports that he does not drink alcohol or use drugs.  ROS: A complete review of systems was performed.  All systems are negative except for pertinent findings as noted.  Physical Exam:  Vital signs in last 24 hours:   Constitutional:  Alert and oriented, No acute distress Cardiovascular: Regular rate and rhythm, No JVD Respiratory: Normal respiratory effort, Lungs clear bilaterally GI: Abdomen is soft, nontender, nondistended, no abdominal masses Genitourinary: No CVAT. Normal male phallus, testes are descended bilaterally and non-tender and without masses, scrotum is normal in appearance without lesions or masses, perineum is normal on inspection. Lymphatic: No lymphadenopathy Neurologic: Grossly intact, no focal deficits Psychiatric: Normal mood and affect  Laboratory Data:  No results for input(s): WBC, HGB, HCT, PLT in the last 72 hours.  No results for input(s): NA, K, CL, GLUCOSE, BUN, CALCIUM, CREATININE in the last 72 hours.  Invalid input(s): CO3   No results found for this or any previous visit (from the past 24 hour(s)). No results found for this or any previous visit (from the past 240 hour(s)).  Renal Function: No results for input(s): CREATININE in the last 168  hours. Estimated Creatinine Clearance: 32.1 mL/min (A) (by C-G formula based on SCr of 2 mg/dL (H)).  Radiologic Imaging: No results found.  Impression/Assessment:  Bilateral hydronephrosis secondary to bilateral lower ureteral obstruction, ARF  Plan:  Cysto, bilateral RGP's, possible bladder biopsy, possible bilateral ureteroscopy, possible bilateral J2 stents

## 2017-05-24 NOTE — Anesthesia Postprocedure Evaluation (Signed)
Anesthesia Post Note  Patient: Daniel Vega  Procedure(s) Performed: Procedure(s) (LRB): CYSTOSCOPY WITH RETROGRADE PYELOGRAM/ POSSIBLE URETERAL STENT PLACEMENT (Bilateral) CYSTOSCOPY WITH BIOPSY (N/A)     Patient location during evaluation: PACU Anesthesia Type: General Level of consciousness: awake and alert Pain management: pain level controlled Vital Signs Assessment: post-procedure vital signs reviewed and stable Respiratory status: spontaneous breathing, nonlabored ventilation, respiratory function stable and patient connected to nasal cannula oxygen Cardiovascular status: blood pressure returned to baseline and stable Postop Assessment: no signs of nausea or vomiting Anesthetic complications: no    Last Vitals:  Vitals:   05/24/17 1130 05/24/17 1143  BP: (!) 154/72   Pulse: 63 61  Resp: 15 (!) 26  Temp:      Last Pain:  Vitals:   05/24/17 1143  TempSrc:   PainSc: 3                  [name],JAMES TERRILL

## 2017-05-25 ENCOUNTER — Encounter (HOSPITAL_BASED_OUTPATIENT_CLINIC_OR_DEPARTMENT_OTHER): Payer: Self-pay | Admitting: Urology

## 2017-05-25 DIAGNOSIS — R338 Other retention of urine: Secondary | ICD-10-CM | POA: Diagnosis not present

## 2017-05-29 DIAGNOSIS — I10 Essential (primary) hypertension: Secondary | ICD-10-CM | POA: Diagnosis not present

## 2017-06-02 ENCOUNTER — Emergency Department (HOSPITAL_COMMUNITY)
Admission: EM | Admit: 2017-06-02 | Discharge: 2017-06-02 | Disposition: A | Discharge status: Home / Self Care | Payer: Medicare Other | Attending: Emergency Medicine | Admitting: Emergency Medicine

## 2017-06-02 ENCOUNTER — Encounter (HOSPITAL_COMMUNITY): Payer: Self-pay | Admitting: Emergency Medicine

## 2017-06-02 DIAGNOSIS — I2510 Atherosclerotic heart disease of native coronary artery without angina pectoris: Secondary | ICD-10-CM | POA: Diagnosis not present

## 2017-06-02 DIAGNOSIS — E119 Type 2 diabetes mellitus without complications: Secondary | ICD-10-CM | POA: Diagnosis not present

## 2017-06-02 DIAGNOSIS — Z86711 Personal history of pulmonary embolism: Secondary | ICD-10-CM | POA: Diagnosis not present

## 2017-06-02 DIAGNOSIS — Z87442 Personal history of urinary calculi: Secondary | ICD-10-CM | POA: Diagnosis not present

## 2017-06-02 DIAGNOSIS — R110 Nausea: Secondary | ICD-10-CM | POA: Diagnosis not present

## 2017-06-02 DIAGNOSIS — Z7984 Long term (current) use of oral hypoglycemic drugs: Secondary | ICD-10-CM | POA: Insufficient documentation

## 2017-06-02 DIAGNOSIS — R339 Retention of urine, unspecified: Secondary | ICD-10-CM

## 2017-06-02 DIAGNOSIS — R1033 Periumbilical pain: Secondary | ICD-10-CM | POA: Diagnosis not present

## 2017-06-02 DIAGNOSIS — I10 Essential (primary) hypertension: Secondary | ICD-10-CM | POA: Diagnosis not present

## 2017-06-02 DIAGNOSIS — Z79899 Other long term (current) drug therapy: Secondary | ICD-10-CM | POA: Insufficient documentation

## 2017-06-02 DIAGNOSIS — Z87891 Personal history of nicotine dependence: Secondary | ICD-10-CM | POA: Insufficient documentation

## 2017-06-02 DIAGNOSIS — Z8546 Personal history of malignant neoplasm of prostate: Secondary | ICD-10-CM | POA: Insufficient documentation

## 2017-06-02 DIAGNOSIS — I252 Old myocardial infarction: Secondary | ICD-10-CM | POA: Insufficient documentation

## 2017-06-02 NOTE — ED Triage Notes (Signed)
Pt c/o urinary retention.  Just had catheter removed this morning.  Dribbled urine at 1400.

## 2017-06-03 NOTE — ED Provider Notes (Signed)
[place] DEPT Provider Note   CSN: [id number] Arrival date & time: 06/02/17  1738     History   Chief Complaint Chief Complaint  Patient presents with  . Urinary Retention    HPI Daniel Vega is a 76 y.o. male.   Abdominal Pain   This is a new problem. The current episode started 1 to 2 hours ago. The problem occurs constantly. The problem has not changed since onset.The pain is located in the suprapubic region. The pain is moderate. Associated symptoms include nausea. Pertinent negatives include dysuria and frequency. The symptoms are aggravated by palpation (not urinating). Nothing relieves the symptoms. Past workup comments: previous foley. His past medical history does not include gallstones.    Past Medical History:  Diagnosis Date  . CAD (coronary artery disease)    CARDIOLOGIST-  DR Martinique-- hx MI 1994, medically treated;  cath 1997 dRCA occlusion w/ collateral flow  . Chronic gout    as of 05-23-2017 -- per pt stable   . Dysuria   . Elevated ferritin level    followed by dr Alen Blew ([facility])-- per last note differiential dx hx alcohol use versus questionable hemochromatosis  . Full dentures   . Hematuria    chronic  . History of adenomatous polyp of colon   . History of kidney stones    multiple-  calcium oxalate  . History of MI (myocardial infarction)    remote hx 1994-- treated medically  . History of pleural effusion 07/2014   in setting acute blood loss taking xarelto for PE--- s/p  right pleural thoracentesis w/ 600cc  . History of prostate cancer    urologist-  dr Diona Fanti---  primary adenocarcinoma-- Gleason 3+3--  06-27-2008  s/p  radioactive prostate seed implants  . History of pulmonary embolism 07/14/2014   w/  RLL pulmary infarction  . History of pulmonary infarction 07/14/2014   RLL due to PE  . History of recurrent pneumonia   . History of urinary retention    10/ 2015 gross hematuria / clots due to taking xarelto  .  Hydronephrosis of right kidney   . Hypercholesteremia   . Hypertension   . Nocturia   . OA (osteoarthritis)    joints  . Personal history of lung cancer 05/1998   adenosquamous left lower lobe primary carcinoma--- s/p left lower lobectomy 08/ 1999 /  per last CT no recurrence  . RA (rheumatoid arthritis) (HCC)    hands  . Renal insufficiency   . S/P IVC filter placed 07-28-2014  . Type 2 diabetes mellitus ([place])   . Wears glasses     Patient Active Problem List   Diagnosis Date Noted  . Urinary retention 08/13/2014  . S/P IVC filter 08/13/2014  . Hydronephrosis, right 07/27/2014  . Chronic anticoagulation 07/27/2014  . Acute blood loss anemia 07/27/2014  . Acute renal failure ([place]) 07/27/2014  . Loss of weight 07/27/2014  . Obesity (BMI 30-39.9) 07/27/2014  . Hematuria 07/26/2014  . Embolism, pulmonary with infarction ([place]) 07/15/2014  . Rheumatoid arthritis ([place]) 07/15/2014  . HNP (herniated nucleus pulposus), lumbar 08/10/2012  . AKI (acute kidney injury) with ATN 02/09/2012  . Hypertension   . Hypercholesteremia   . Glucose intolerance (impaired glucose tolerance)   . Kidney stones, calcium oxalate   . CAD (coronary artery disease)   . 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 Adenomatous colon polyp   . Prostate cancer [facility])     Past Surgical History:  Procedure Laterality Date  . CARDIAC CATHETERIZATION  1997  dr Martinique  . CARDIAC CATHETERIZATION  02-11-2011  dr Martinique   (abnormal stress echo) single vessel occlusive artherosclerotic CAD involving the distal RCA w/ excellent collateral flow;  good LVSF, ef 55%  . CATARACT EXTRACTION W/PHACO Left 12/10/2015   Procedure: CATARACT EXTRACTION PHACO AND INTRAOCULAR LENS PLACEMENT LEFT EYE CDE=10.98;  Surgeon: Tonny Branch, MD;  Location: [facility];  Service: Ophthalmology;  Laterality: Left;  . CATARACT EXTRACTION W/PHACO Right 01/07/2016   Procedure: CATARACT EXTRACTION PHACO AND INTRAOCULAR LENS PLACEMENT (IOC);  Surgeon:  Tonny Branch, MD;  Location: [facility];  Service: Ophthalmology;  Laterality: Right;  CDE:15.11  . COLONOSCOPY  last one 2016 approx.  . CYSTOSCOPY W/ URETERAL STENT PLACEMENT Bilateral 05/24/2017   Procedure: CYSTOSCOPY WITH RETROGRADE PYELOGRAM/ POSSIBLE URETERAL STENT PLACEMENT;  Surgeon: Franchot Gallo, MD;  [facility];  Service: Urology;  Laterality: Bilateral;   [phone] MEDICARE-[id number] BCBS-[id number]  . CYSTOSCOPY WITH BIOPSY N/A 05/24/2017   Procedure: CYSTOSCOPY WITH BIOPSY;  Surgeon: Franchot Gallo, MD;  Location: [facility];  Service: Urology;  Laterality: N/A;  . CYSTOSCOPY WITH RETROGRADE PYELOGRAM, URETEROSCOPY AND STENT PLACEMENT Right 07/31/2014   Procedure: CYSTOSCOPY WITH RETROGRADE PYELOGRAM, URETEROSCOPY WITH FULGURATION AND STENT PLACEMENT;  Surgeon: [name], MD;  Location: [facility];  Service: Urology;  Laterality: Right;  . IVC FILTER INSERTION  07/28/2014  . LUMBAR LAMINECTOMY/DECOMPRESSION MICRODISCECTOMY  08/10/2012   Procedure: LUMBAR LAMINECTOMY/DECOMPRESSION MICRODISCECTOMY 1 LEVEL;  Surgeon: Winfield Cunas, MD;  Location: [place] [facility];  Service: Neurosurgery;  Laterality: Left;  LEFT Lumbar five-sacral one microdiskectomy  . LUNG LOBECTOMY Left 06-11-1998   dr Cyndia Bent   LLL  . RADIOACTIVE PROSTATE SEED IMPLANTS  06-27-2008  dr Diona Fanti  at [facility]  . TESTICLE TORSION REDUCTION Right 1962  . TRANSTHORACIC ECHOCARDIOGRAM  07/15/2014   severe concentrial LVH, ef 48-27%, grade 1 diastolic dysfunction/  mild TR/  PASP 28mmHg  . VEIN LIGATION AND STRIPPING Right        Home Medications    Prior to Admission medications   Medication Sig Start Date End Date Taking? Authorizing Provider  allopurinol (ZYLOPRIM) 100 MG tablet Take 100 mg by mouth daily after breakfast.  07/23/12   [provider]  ALPRAZolam Duanne Moron) 0.5 MG tablet Take 0.5 mg by mouth 3 (three) times daily as needed for anxiety  (anxiety).     [provider]  atorvastatin (LIPITOR) 40 MG tablet Take 1 tablet (40 mg total) by mouth every morning. 04/30/15   Martinique, Peter M, MD  Cyanocobalamin (VITAMIN B 12 PO) Take 1,000 mcg by mouth daily after breakfast.     [provider]  finasteride (PROSCAR) 5 MG tablet Take 5 mg by mouth daily after breakfast.     [provider]  glucosamine-chondroitin 500-400 MG tablet Take 1 tablet by mouth 2 (two) times daily.     [provider]  metFORMIN (GLUCOPHAGE-XR) 500 MG 24 hr tablet Take 1,000 mg by mouth every evening.  02/04/16   [provider]  nitroGLYCERIN (NITROSTAT) 0.4 MG SL tablet Place 0.4 mg under the tongue every 5 (five) minutes as needed. For chest pain    [provider]  oxybutynin (DITROPAN) 5 MG tablet Take 1 tablet (5 mg total) by mouth every 8 (eight) hours as needed for bladder spasms. 05/24/17   Franchot Gallo, MD  traMADol (ULTRAM) 50 MG tablet Take 1 tablet (50 mg total) by mouth  every 6 (six) hours as needed. 05/24/17   Franchot Gallo, MD  verapamil (CALAN-SR) 240 MG CR tablet Take 240 mg by mouth 2 (two) times daily.     [provider]    Family History Family History  Problem Relation Age of Onset  . Heart disease Mother   . Hypertension Mother   . Heart disease Father   . Hypertension Father   . Stroke Father   . Other Brother        spinal meningitis  . Other Brother        MVA  . Cancer Sister        stomach    Social History Social History  Substance Use Topics  . Smoking status: Former Smoker    Packs/day: 2.00    Years: 35.00    Types: Cigarettes    Quit date: 10/17/1982  . Smokeless tobacco: Former Systems developer    Types: Chew    Quit date: 03/15/2012  . Alcohol use No     Comment: hx alcohol abuse     Allergies   Codeine and Ibuprofen   Review of Systems Review of Systems  Gastrointestinal: Positive for abdominal pain and nausea.  Genitourinary: Negative for  dysuria and frequency.  All other systems reviewed and are negative.    Physical Exam Updated Vital Signs BP (!) 171/117 (BP Location: Right Arm)   Pulse (!) 110   Temp 98 F (36.7 C) (Oral)   Resp 20   SpO2 99%   Physical Exam  Constitutional: He is oriented to person, place, and time. He appears well-developed and well-nourished.  HENT:  Head: Normocephalic and atraumatic.  Eyes: Conjunctivae and EOM are normal.  Neck: Normal range of motion.  Cardiovascular: Normal rate.   Pulmonary/Chest: Effort normal. No respiratory distress.  Abdominal: Soft. He exhibits no distension.  Musculoskeletal: Normal range of motion. He exhibits no edema or deformity.  Neurological: He is alert and oriented to person, place, and time. No cranial nerve deficit. Coordination normal.  Skin: Skin is warm and dry.  Nursing note and vitals reviewed.    ED Treatments / Results  Labs (all labs ordered are listed, but only abnormal results are displayed) Labs Reviewed - No data to display  EKG  EKG Interpretation None       Radiology No results found.  Procedures Procedures (including critical care time)  Medications Ordered in ED Medications - No data to display   Initial Impression / Assessment and Plan / ED Course  I have reviewed the triage vital signs and the nursing notes.  Pertinent labs & imaging results that were available during my care of the patient were reviewed by me and considered in my medical decision making (see chart for details).     Foley removed today, h/o retention multiple times. Improved symptoms with foley insertion now. Will continue following up with Dr. Diona Fanti.   Final Clinical Impressions(s) / ED Diagnoses   Final diagnoses:  Urinary retention    New Prescriptions Discharge Medication List as of 06/02/2017 10:43 PM       Dellia Donnelly, Corene Cornea, MD 06/03/17 1627

## 2017-06-06 ENCOUNTER — Ambulatory Visit (INDEPENDENT_AMBULATORY_CARE_PROVIDER_SITE_OTHER): Payer: Medicare Other | Admitting: Urology

## 2017-06-06 DIAGNOSIS — N130 Hydronephrosis with ureteropelvic junction obstruction: Secondary | ICD-10-CM | POA: Diagnosis not present

## 2017-06-06 DIAGNOSIS — C670 Malignant neoplasm of trigone of bladder: Secondary | ICD-10-CM | POA: Diagnosis not present

## 2017-06-07 DIAGNOSIS — N1339 Other hydronephrosis: Secondary | ICD-10-CM | POA: Diagnosis not present

## 2017-06-07 DIAGNOSIS — R350 Frequency of micturition: Secondary | ICD-10-CM | POA: Diagnosis not present

## 2017-06-09 DIAGNOSIS — N1339 Other hydronephrosis: Secondary | ICD-10-CM | POA: Diagnosis not present

## 2017-06-15 DIAGNOSIS — C670 Malignant neoplasm of trigone of bladder: Secondary | ICD-10-CM | POA: Diagnosis not present

## 2017-06-15 DIAGNOSIS — N130 Hydronephrosis with ureteropelvic junction obstruction: Secondary | ICD-10-CM | POA: Diagnosis not present

## 2017-06-15 DIAGNOSIS — N183 Chronic kidney disease, stage 3 (moderate): Secondary | ICD-10-CM | POA: Diagnosis not present

## 2017-06-27 ENCOUNTER — Telehealth: Payer: Self-pay | Admitting: Oncology

## 2017-06-27 ENCOUNTER — Ambulatory Visit (HOSPITAL_BASED_OUTPATIENT_CLINIC_OR_DEPARTMENT_OTHER): Payer: Medicare Other | Admitting: Oncology

## 2017-06-27 ENCOUNTER — Telehealth: Payer: Self-pay | Admitting: *Deleted

## 2017-06-27 VITALS — BP 153/63 | HR 91 | Temp 98.1°F | Resp 20 | Ht 65.0 in | Wt 194.6 lb

## 2017-06-27 DIAGNOSIS — Z86711 Personal history of pulmonary embolism: Secondary | ICD-10-CM | POA: Diagnosis not present

## 2017-06-27 DIAGNOSIS — N289 Disorder of kidney and ureter, unspecified: Secondary | ICD-10-CM | POA: Diagnosis not present

## 2017-06-27 DIAGNOSIS — Z8546 Personal history of malignant neoplasm of prostate: Secondary | ICD-10-CM | POA: Diagnosis not present

## 2017-06-27 DIAGNOSIS — C61 Malignant neoplasm of prostate: Secondary | ICD-10-CM

## 2017-06-27 DIAGNOSIS — C679 Malignant neoplasm of bladder, unspecified: Secondary | ICD-10-CM

## 2017-06-27 DIAGNOSIS — Z923 Personal history of irradiation: Secondary | ICD-10-CM | POA: Diagnosis not present

## 2017-06-27 DIAGNOSIS — C670 Malignant neoplasm of trigone of bladder: Secondary | ICD-10-CM

## 2017-06-27 NOTE — Telephone Encounter (Signed)
Per Dr. Alen Blew, I called patient to see if Daniel Vega could come today for a New Patient appointment at 1:30 pm. Daniel Vega stated, "yes mam, I will be there. Thank you for calling."

## 2017-06-27 NOTE — Progress Notes (Signed)
Reason for Referral: Bladder cancer.   HPI: 76 year old native of [place] where he lived the majority of his life. He is a gentleman with coronary artery disease, history of a DVT and prostate cancer. His diagnosis of prostate cancer was in 2009 where he presented with a Gleason score 3+3 = 6 and was treated with radiation therapy and seed implants. He was in his usual state of health until he developed a pulmonary embolism in 2015 and was treated with Xarelto and subsequently Lovenox. He developed hematuria and was considered to be related to his anticoagulation as well as hemorrhagic cystitis. He was treated with hyperbaric oxygen treatment 2 years ago. He recently developed symptoms of recurrent hematuria and difficulty urination. Seen in the emergency department on 05/15/2017 and a CT scan of the abdomen and pelvis showed bladder wall thickening as well as bilateral hydronephrosis. His creatinine was 2.0. He subsequently underwent TURBT in in the care of Dr. Diona Fanti on 05/24/2017. The final pathology showed invasive high-grade urothelial carcinoma involving the smooth muscle. His workup did not reveal any evidence of metastatic disease. He also had double-J stent placement by Dr. Diona Fanti that the time. Despite this procedure, he continues to have urinary symptoms including hematuria as well as difficulty with urination. His performance status remains adequate and his quality of life is severely affected his urinary symptoms. He does not report any weight loss or appetite changes. He is not report any pelvic pain or discomfort.   He does not report any headaches, blurry vision, syncope or seizures. He does not report any fevers, chills or sweats. He does not report any cough, wheezing or hemoptysis. He does not report any nausea, vomiting or abdominal pain. He does not report any constipation or diarrhea. He does report hematuria at times and has an indwelling Foley catheter. He does not report any  skeletal complaints. He does not report any arthralgias or myalgias. He does not report any lymphadenopathy or petechiae.   Past Medical History:  Diagnosis Date  . CAD (coronary artery disease)    CARDIOLOGIST-  DR Martinique-- hx MI 1994, medically treated;  cath 1997 dRCA occlusion w/ collateral flow  . Chronic gout    as of 05-23-2017 -- per pt stable   . Dysuria   . Elevated ferritin level    followed by dr Alen Blew ([facility])-- per last note differiential dx hx alcohol use versus questionable hemochromatosis  . Full dentures   . Hematuria    chronic  . History of adenomatous polyp of colon   . History of kidney stones    multiple-  calcium oxalate  . History of MI (myocardial infarction)    remote hx 1994-- treated medically  . History of pleural effusion 07/2014   in setting acute blood loss taking xarelto for PE--- s/p  right pleural thoracentesis w/ 600cc  . History of prostate cancer    urologist-  dr Diona Fanti---  primary adenocarcinoma-- Gleason 3+3--  06-27-2008  s/p  radioactive prostate seed implants  . History of pulmonary embolism 07/14/2014   w/  RLL pulmary infarction  . History of pulmonary infarction 07/14/2014   RLL due to PE  . History of recurrent pneumonia   . History of urinary retention    10/ 2015 gross hematuria / clots due to taking xarelto  . Hydronephrosis of right kidney   . Hypercholesteremia   . Hypertension   . Nocturia   . OA (osteoarthritis)    joints  . Personal history  of lung cancer 05/1998   adenosquamous left lower lobe primary carcinoma--- s/p left lower lobectomy 08/ 1999 /  per last CT no recurrence  . RA (rheumatoid arthritis) (HCC)    hands  . Renal insufficiency   . S/P IVC filter placed 07-28-2014  . Type 2 diabetes mellitus ([place])   . Wears glasses   :  Past Surgical History:  Procedure Laterality Date  . CARDIAC CATHETERIZATION  1997  dr Martinique  . CARDIAC CATHETERIZATION  02-11-2011  dr Martinique   (abnormal stress  echo) single vessel occlusive artherosclerotic CAD involving the distal RCA w/ excellent collateral flow;  good LVSF, ef 55%  . CATARACT EXTRACTION W/PHACO Left 12/10/2015   Procedure: CATARACT EXTRACTION PHACO AND INTRAOCULAR LENS PLACEMENT LEFT EYE CDE=10.98;  Surgeon: Tonny Branch, MD;  Location: [facility];  Service: Ophthalmology;  Laterality: Left;  . CATARACT EXTRACTION W/PHACO Right 01/07/2016   Procedure: CATARACT EXTRACTION PHACO AND INTRAOCULAR LENS PLACEMENT (IOC);  Surgeon: Tonny Branch, MD;  Location: [facility];  Service: Ophthalmology;  Laterality: Right;  CDE:15.11  . COLONOSCOPY  last one 2016 approx.  . CYSTOSCOPY W/ URETERAL STENT PLACEMENT Bilateral 05/24/2017   Procedure: CYSTOSCOPY WITH RETROGRADE PYELOGRAM/ POSSIBLE URETERAL STENT PLACEMENT;  Surgeon: Franchot Gallo, MD;  Location: [facility];  Service: Urology;  Laterality: Bilateral;   [phone] [id number] A [id number]  . CYSTOSCOPY WITH BIOPSY N/A 05/24/2017   Procedure: CYSTOSCOPY WITH BIOPSY;  Surgeon: Franchot Gallo, MD;  Location[facility];  Service: Urology;  Laterality: N/A;  . CYSTOSCOPY WITH RETROGRADE PYELOGRAM, URETEROSCOPY AND STENT PLACEMENT Right 07/31/2014   Procedure: CYSTOSCOPY WITH RETROGRADE PYELOGRAM, URETEROSCOPY WITH FULGURATION AND STENT PLACEMENT;  Surgeon: [name], MD;  Location: [facility];  Service: Urology;  Laterality: Right;  . IVC FILTER INSERTION  07/28/2014  . LUMBAR LAMINECTOMY/DECOMPRESSION MICRODISCECTOMY  08/10/2012   Procedure: LUMBAR LAMINECTOMY/DECOMPRESSION MICRODISCECTOMY 1 LEVEL;  Surgeon: Winfield Cunas, MD;  Location: [facility];  Service: Neurosurgery;  Laterality: Left;  LEFT Lumbar five-sacral one microdiskectomy  . LUNG LOBECTOMY Left 06-11-1998   dr Cyndia Bent   LL[facility]  . RADIOACTIVE PROSTATE SEED IMPLANTS  06-27-2008  dr Diona Fanti  at [facility]  . TESTICLE TORSION REDUCTION Right 1962  . TRANSTHORACIC ECHOCARDIOGRAM  07/15/2014    severe concentrial LVH, ef 70-62%, grade 1 diastolic dysfunction/  mild TR/  PASP 60mmHg  . VEIN LIGATION AND STRIPPING Right   :   Current Outpatient Prescriptions:  .  allopurinol (ZYLOPRIM) 100 MG tablet, Take 100 mg by mouth daily after breakfast. , Disp: , Rfl:  .  ALPRAZolam (XANAX) 0.5 MG tablet, Take 0.5 mg by mouth 3 (three) times daily as needed for anxiety (anxiety). , Disp: , Rfl:  .  atorvastatin (LIPITOR) 40 MG tablet, Take 1 tablet (40 mg total) by mouth every morning., Disp: 90 tablet, Rfl: 3 .  Cyanocobalamin (VITAMIN B 12 PO), Take 1,000 mcg by mouth daily after breakfast. , Disp: , Rfl:  .  finasteride (PROSCAR) 5 MG tablet, Take 5 mg by mouth daily after breakfast. , Disp: , Rfl:  .  glucosamine-chondroitin 500-400 MG tablet, Take 1 tablet by mouth 2 (two) times daily. , Disp: , Rfl:  .  oxybutynin (DITROPAN) 5 MG tablet, Take 1 tablet (5 mg total) by mouth every 8 (eight) hours as needed for bladder spasms., Disp: 60 tablet, Rfl: 1 .  verapamil (CALAN-SR) 240 MG CR tablet, Take 240 mg by mouth 2 (two) times daily. , Disp: , Rfl:  .  metFORMIN (GLUCOPHAGE-XR) 500 MG 24 hr tablet, Take 1,000 mg by mouth every evening. , Disp: , Rfl:  .  nitroGLYCERIN (NITROSTAT) 0.4 MG SL tablet, Place 0.4 mg under the tongue every 5 (five) minutes as needed. For chest pain, Disp: , Rfl: :  Allergies  Allergen Reactions  . Codeine Palpitations    Made heart hurt  . Ibuprofen Palpitations  :  Family History  Problem Relation Age of Onset  . Heart disease Mother   . Hypertension Mother   . Heart disease Father   . Hypertension Father   . Stroke Father   . Other Brother        spinal meningitis  . Other Brother        MVA  . Cancer Sister        stomach  :  Social History   Social History  . Marital status: Married    Spouse name: N/A  . Number of children: 5  . Years of education: N/A   Occupational History  . 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 Smoking status: Former Smoker    Packs/day: 2.00    Years: 35.00    Types: Cigarettes    Quit date: 10/17/1982  . Smokeless tobacco: Former Systems developer    Types: Chew    Quit date: 03/15/2012  . Alcohol use No     Comment: hx alcohol abuse  . Drug use: No  . Sexual activity: Yes    Birth control/ protection: None   Other Topics Concern  . Not on file   Social History Narrative  . No narrative on file  :  Pertinent items are noted in HPI.  Exam: Blood pressure (!) 153/63, pulse 91, temperature 98.1 F (36.7 C), temperature source Oral, resp. rate 20, height 5\' 5"  (1.651 m), weight 194 lb 9.6 oz (88.3 kg), SpO2 98 %. General appearance: alert and cooperative Throat: lips, mucosa, and tongue normal; teeth and gums normal Neck: no adenopathy Back: negative Resp: clear to auscultation bilaterally Chest wall: no tenderness Cardio: regular rate and rhythm, S1, S2 normal, no murmur, click, rub or gallop GI: soft, non-tender; bowel sounds normal; no masses,  no organomegaly Extremities: extremities normal, atraumatic, no cyanosis or edema Pulses: 2+ and symmetric Skin: Skin color, texture, turgor normal. No rashes or lesions Lymph nodes: Cervical, supraclavicular, and axillary nodes normal.  CBC    Component Value Date/Time   WBC 7.7 05/15/2017 1155   RBC 3.96 (L) 05/15/2017 1155   HGB 12.9 (L) 05/15/2017 1155   HGB 15.2 10/25/2013 0955   HCT 37.0 (L) 05/15/2017 1155   HCT 44.5 10/25/2013 0955   PLT 170 05/15/2017 1155   PLT 139 (L) 10/25/2013 0955   MCV 93.4 05/15/2017 1155   MCV 98.4 (H) 10/25/2013 0955   MCH 32.6 05/15/2017 1155   MCHC 34.9 05/15/2017 1155   RDW 12.9 05/15/2017 1155   RDW 14.0 10/25/2013 0955   LYMPHSABS 1.5 08/12/2014 2333   LYMPHSABS 2.0 10/25/2013 0955   MONOABS 0.7 08/12/2014 2333   MONOABS 0.5 10/25/2013 0955   EOSABS 0.3 08/12/2014 2333   EOSABS 0.2 10/25/2013 0955   BASOSABS 0.0 08/12/2014 2333   BASOSABS 0.0 10/25/2013 0955      Chemistry      Component Value Date/Time   NA 138 05/15/2017 1155   NA 140 10/25/2013 0955   K 4.3 05/15/2017 1155   K 4.5 10/25/2013 0955  CL 105 05/15/2017 1155   CL 108 (H) 01/24/2013 1518   CO2 25 05/15/2017 1155   CO2 23 10/25/2013 0955   BUN 29 (H) 05/15/2017 1155   BUN 19.7 10/25/2013 0955   CREATININE 2.00 (H) 05/15/2017 1155   CREATININE 1.0 10/25/2013 0955      Component Value Date/Time   CALCIUM 8.9 05/15/2017 1155   CALCIUM 9.3 10/25/2013 0955   ALKPHOS 50 05/15/2017 1155   ALKPHOS 75 10/25/2013 0955   AST 16 05/15/2017 1155   AST 20 10/25/2013 0955   ALT 13 (L) 05/15/2017 1155   ALT 21 10/25/2013 0955   BILITOT 0.7 05/15/2017 1155   BILITOT 0.96 10/25/2013 0955       Assessment and Plan:   76 year old gentleman with the following issues:  1. Invasive high-grade urothelial carcinoma involving the smooth muscle of the bladder diagnosed in July 2018. He presented with urinary retention and hematuria. He was found to have abnormal CT scan without any evidence of lymphadenopathy or metastatic disease. His creatinine at the time was 2.0. He is status post TURBT done on 05/24/2017 which showed high-grade invasive urothelial carcinoma. He does have history of prostate cancer that was treated with radiation in the past.  The natural course of this disease was discussed today with the patient and his family. He understands his only option of cure would be primary surgical therapy with systemic chemotherapy to be used neoadjuvantly or adjuvantly. The rationale for using chemotherapy was discussed today in detail. Risks and benefits of chemotherapy in the form of cisplatin and gemcitabine was reviewed today. Complications include nausea, vomiting, myelosuppression, worsening renal insufficiency as well as worsening bleeding.  I fear that his malignancy might be chemotherapy resistant. Given the possibility of a bladder cancer related to previous radiation can create more  chemotherapy resistant histologies. He is already having significant symptoms associated with this bladder tumor and he might benefit from primary surgical therapy and consideration for adjuvant chemotherapy depending on the final pathology. This approach was debated versus using neoadjuvant chemotherapy and was discussed with the patient in detail. Pros and cons of both approaches were reviewed and I feel that he has been off primary surgery up front.  Depending on the pathology results, we will discuss the role of systemic chemotherapy in the adjuvant setting after surgery.  I will communicate these findings to Dr. Tresa Moore and Dr. Diona Fanti to consider primary surgery as soon as possible.  2. Prostate cancer: Status post radiation therapy with seed implants in 2009. His Gleason score 3+3 = 6 at the time.  3. History of pulmonary embolism: He has an IVC filter in place and off anticoagulation. I see no contraindication to proceeding with surgery at this time.  4. Renal insufficiency: Related to his hydronephrosis and bladder cancer. He status post double-J stent placement which should improve his kidney function.  5. Follow-up: Will be after his surgery to discuss the role of adjuvant chemotherapy at the time.

## 2017-06-27 NOTE — Telephone Encounter (Signed)
Gave avs and calendar for November  °

## 2017-06-28 DIAGNOSIS — N183 Chronic kidney disease, stage 3 (moderate): Secondary | ICD-10-CM | POA: Diagnosis not present

## 2017-07-03 DIAGNOSIS — C670 Malignant neoplasm of trigone of bladder: Secondary | ICD-10-CM | POA: Diagnosis not present

## 2017-07-03 DIAGNOSIS — R300 Dysuria: Secondary | ICD-10-CM | POA: Diagnosis not present

## 2017-07-06 DIAGNOSIS — R140 Abdominal distension (gaseous): Secondary | ICD-10-CM | POA: Diagnosis not present

## 2017-07-06 DIAGNOSIS — E1122 Type 2 diabetes mellitus with diabetic chronic kidney disease: Secondary | ICD-10-CM | POA: Insufficient documentation

## 2017-07-06 DIAGNOSIS — I129 Hypertensive chronic kidney disease with stage 1 through stage 4 chronic kidney disease, or unspecified chronic kidney disease: Secondary | ICD-10-CM | POA: Diagnosis not present

## 2017-07-06 DIAGNOSIS — Z85118 Personal history of other malignant neoplasm of bronchus and lung: Secondary | ICD-10-CM | POA: Diagnosis not present

## 2017-07-06 DIAGNOSIS — Z8546 Personal history of malignant neoplasm of prostate: Secondary | ICD-10-CM | POA: Insufficient documentation

## 2017-07-06 DIAGNOSIS — I2510 Atherosclerotic heart disease of native coronary artery without angina pectoris: Secondary | ICD-10-CM | POA: Insufficient documentation

## 2017-07-06 DIAGNOSIS — N189 Chronic kidney disease, unspecified: Secondary | ICD-10-CM | POA: Diagnosis not present

## 2017-07-06 DIAGNOSIS — Z87891 Personal history of nicotine dependence: Secondary | ICD-10-CM | POA: Diagnosis not present

## 2017-07-06 DIAGNOSIS — R509 Fever, unspecified: Secondary | ICD-10-CM | POA: Diagnosis present

## 2017-07-06 DIAGNOSIS — Z79899 Other long term (current) drug therapy: Secondary | ICD-10-CM | POA: Insufficient documentation

## 2017-07-06 DIAGNOSIS — Z7984 Long term (current) use of oral hypoglycemic drugs: Secondary | ICD-10-CM | POA: Insufficient documentation

## 2017-07-06 DIAGNOSIS — N390 Urinary tract infection, site not specified: Secondary | ICD-10-CM | POA: Diagnosis not present

## 2017-07-06 DIAGNOSIS — R339 Retention of urine, unspecified: Secondary | ICD-10-CM | POA: Diagnosis not present

## 2017-07-07 ENCOUNTER — Emergency Department (HOSPITAL_COMMUNITY): Payer: Medicare Other

## 2017-07-07 ENCOUNTER — Encounter (HOSPITAL_COMMUNITY): Payer: Self-pay

## 2017-07-07 ENCOUNTER — Emergency Department (HOSPITAL_COMMUNITY)
Admission: EM | Admit: 2017-07-07 | Discharge: 2017-07-07 | Disposition: A | Discharge status: Home / Self Care | Payer: Medicare Other | Attending: Emergency Medicine | Admitting: Emergency Medicine

## 2017-07-07 DIAGNOSIS — R339 Retention of urine, unspecified: Secondary | ICD-10-CM | POA: Diagnosis not present

## 2017-07-07 DIAGNOSIS — N390 Urinary tract infection, site not specified: Secondary | ICD-10-CM

## 2017-07-07 DIAGNOSIS — R140 Abdominal distension (gaseous): Secondary | ICD-10-CM | POA: Diagnosis not present

## 2017-07-07 LAB — URINALYSIS, ROUTINE W REFLEX MICROSCOPIC
Bilirubin Urine: NEGATIVE
Glucose, UA: NEGATIVE mg/dL
Ketones, ur: NEGATIVE mg/dL
Nitrite: NEGATIVE
PROTEIN: 100 mg/dL — AB
SPECIFIC GRAVITY, URINE: 1.01 (ref 1.005–1.030)
pH: 5 (ref 5.0–8.0)

## 2017-07-07 LAB — COMPREHENSIVE METABOLIC PANEL
ALBUMIN: 3.5 g/dL (ref 3.5–5.0)
ALT: 9 U/L — AB (ref 17–63)
AST: 14 U/L — AB (ref 15–41)
Alkaline Phosphatase: 42 U/L (ref 38–126)
Anion gap: 10 (ref 5–15)
BUN: 25 mg/dL — ABNORMAL HIGH (ref 6–20)
CALCIUM: 8.5 mg/dL — AB (ref 8.9–10.3)
CHLORIDE: 100 mmol/L — AB (ref 101–111)
CO2: 21 mmol/L — AB (ref 22–32)
CREATININE: 1.33 mg/dL — AB (ref 0.61–1.24)
GFR calc non Af Amer: 51 mL/min — ABNORMAL LOW (ref >60)
GFR, EST AFRICAN AMERICAN: 59 mL/min — AB (ref >60)
GLUCOSE: 108 mg/dL — AB (ref 65–99)
Potassium: 4 mmol/L (ref 3.5–5.1)
SODIUM: 131 mmol/L — AB (ref 135–145)
TOTAL PROTEIN: 6.7 g/dL (ref 6.5–8.1)
Total Bilirubin: 1.1 mg/dL (ref 0.3–1.2)

## 2017-07-07 LAB — CBC WITH DIFFERENTIAL/PLATELET
BASOS ABS: 0 10*3/uL (ref 0.0–0.1)
BASOS PCT: 0 %
EOS ABS: 0.1 10*3/uL (ref 0.0–0.7)
Eosinophils Relative: 1 %
HCT: 26.5 % — ABNORMAL LOW (ref 39.0–52.0)
Hemoglobin: 9.1 g/dL — ABNORMAL LOW (ref 13.0–17.0)
Lymphocytes Relative: 19 %
Lymphs Abs: 1.8 10*3/uL (ref 0.7–4.0)
MCH: 33.8 pg (ref 26.0–34.0)
MCHC: 34.3 g/dL (ref 30.0–36.0)
MCV: 98.5 fL (ref 78.0–100.0)
Monocytes Absolute: 1.3 10*3/uL — ABNORMAL HIGH (ref 0.1–1.0)
Monocytes Relative: 14 %
NEUTROS PCT: 66 %
Neutro Abs: 6.5 10*3/uL (ref 1.7–7.7)
PLATELETS: 156 10*3/uL (ref 150–400)
RBC: 2.69 MIL/uL — AB (ref 4.22–5.81)
RDW: 14.4 % (ref 11.5–15.5)
WBC: 9.7 10*3/uL (ref 4.0–10.5)

## 2017-07-07 MED ORDER — SODIUM CHLORIDE 0.9 % IV BOLUS (SEPSIS)
500.0000 mL | Freq: Once | INTRAVENOUS | Status: AC
  Administered 2017-07-07: 500 mL via INTRAVENOUS

## 2017-07-07 MED ORDER — CEPHALEXIN 500 MG PO CAPS: 500.0000 mg | ORAL_CAPSULE | Freq: Four times a day (QID) | ORAL | 0 refills | Status: DC

## 2017-07-07 MED ORDER — LIDOCAINE HCL 2 % EX GEL
CUTANEOUS | Status: AC
  Administered 2017-07-07: 02:00:00
  Filled 2017-07-07: qty 11

## 2017-07-07 MED ORDER — PHENAZOPYRIDINE HCL 200 MG PO TABS
200.0000 mg | ORAL_TABLET | Freq: Three times a day (TID) | ORAL | Status: DC
  Administered 2017-07-07: 200 mg via ORAL
  Filled 2017-07-07: qty 1

## 2017-07-07 MED ORDER — IOPAMIDOL (ISOVUE-300) INJECTION 61%
100.0000 mL | Freq: Once | INTRAVENOUS | Status: AC | PRN
  Administered 2017-07-07: 100 mL via INTRAVENOUS

## 2017-07-07 MED ORDER — IOPAMIDOL (ISOVUE-300) INJECTION 61%
INTRAVENOUS | Status: AC
  Administered 2017-07-07: 100 mL via INTRAVENOUS
  Filled 2017-07-07: qty 100

## 2017-07-07 MED ORDER — TAMSULOSIN HCL 0.4 MG PO CAPS
0.4000 mg | ORAL_CAPSULE | Freq: Every day | ORAL | Status: DC
  Administered 2017-07-07: 0.4 mg via ORAL
  Filled 2017-07-07: qty 1

## 2017-07-07 MED ORDER — OXYBUTYNIN CHLORIDE ER 10 MG PO TB24: 10.0000 mg | ORAL_TABLET | Freq: Every day | ORAL | 0 refills | Status: DC

## 2017-07-07 MED ORDER — DEXTROSE 5 % IV SOLN
1.0000 g | Freq: Once | INTRAVENOUS | Status: AC
  Administered 2017-07-07: 1 g via INTRAVENOUS
  Filled 2017-07-07: qty 10

## 2017-07-07 NOTE — ED Provider Notes (Signed)
[facility] DEPT Provider Note   CSN: [id number] Arrival date & time: 07/06/17  2246     History   Chief Complaint Chief Complaint  Patient presents with  . Fever  . dehydrated    HPI Daniel Vega is a 76 y.o. male.  The history is provided by the patient.  Fever   The current episode started 2 days ago. The problem has been rapidly worsening. The maximum temperature noted was 102 to 102.9 F. Pertinent negatives include no chest pain and no headaches. Associated symptoms comments: Difficulty urinating . He has tried nothing for the symptoms. The treatment provided no relief.    Past Medical History:  Diagnosis Date  . CAD (coronary artery disease)    CARDIOLOGIST-  DR Martinique-- hx MI 1994, medically treated;  cath 1997 dRCA occlusion w/ collateral flow  . Chronic gout    as of 05-23-2017 -- per pt stable   . Dysuria   . Elevated ferritin level    followed by dr Alen Blew ([facility])-- per last note differiential dx hx alcohol use versus questionable hemochromatosis  . Full dentures   . Hematuria    chronic  . History of adenomatous polyp of colon   . History of kidney stones    multiple-  calcium oxalate  . History of MI (myocardial infarction)    remote hx 1994-- treated medically  . History of pleural effusion 07/2014   in setting acute blood loss taking xarelto for PE--- s/p  right pleural thoracentesis w/ 600cc  . History of prostate cancer    urologist-  dr Diona Fanti---  primary adenocarcinoma-- Gleason 3+3--  06-27-2008  s/p  radioactive prostate seed implants  . History of pulmonary embolism 07/14/2014   w/  RLL pulmary infarction  . History of pulmonary infarction 07/14/2014   RLL due to PE  . History of recurrent pneumonia   . History of urinary retention    10/ 2015 gross hematuria / clots due to taking xarelto  . Hydronephrosis of right kidney   . Hypercholesteremia   . Hypertension   . Nocturia   . OA (osteoarthritis)    joints  .  Personal history of lung cancer 05/1998   adenosquamous left lower lobe primary carcinoma--- s/p left lower lobectomy 08/ 1999 /  per last CT no recurrence  . RA (rheumatoid arthritis) (HCC)    hands  . Renal insufficiency   . S/P IVC filter placed 07-28-2014  . Type 2 diabetes mellitus ([place])   . Wears glasses     Patient Active Problem List   Diagnosis Date Noted  . Urinary retention 08/13/2014  . S/P IVC filter 08/13/2014  . Hydronephrosis, right 07/27/2014  . Chronic anticoagulation 07/27/2014  . Acute blood loss anemia 07/27/2014  . Acute renal failure ([place]) 07/27/2014  . Loss of weight 07/27/2014  . Obesity (BMI 30-39.9) 07/27/2014  . Hematuria 07/26/2014  . Embolism, pulmonary with infarction ([place]) 07/15/2014  . Rheumatoid arthritis ([place]) 07/15/2014  . HNP (herniated nucleus pulposus), lumbar 08/10/2012  . AKI (acute kidney injury) with ATN 02/09/2012  . Hypertension   . Hypercholesteremia   . Glucose intolerance (impaired glucose tolerance)   . Kidney stones, calcium oxalate   . CAD (coronary artery disease)   . 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 Adenomatous colon polyp   . Prostate cancer [facility])     Past Surgical History:  Procedure Laterality Date  . CARDIAC CATHETERIZATION  1997  dr Martinique  .  CARDIAC CATHETERIZATION  02-11-2011  dr Martinique   (abnormal stress echo) single vessel occlusive artherosclerotic CAD involving the distal RCA w/ excellent collateral flow;  good LVSF, ef 55%  . CATARACT EXTRACTION W/PHACO Left 12/10/2015   Procedure: CATARACT EXTRACTION PHACO AND INTRAOCULAR LENS PLACEMENT LEFT EYE CDE=10.98;  Surgeon: Tonny Branch, MD;  Location: [facility];  Service: Ophthalmology;  Laterality: Left;  . CATARACT EXTRACTION W/PHACO Right 01/07/2016   Procedure: CATARACT EXTRACTION PHACO AND INTRAOCULAR LENS PLACEMENT (IOC);  Surgeon: Tonny Branch, MD;  Location: [facility];  Service: Ophthalmology;  Laterality: Right;  CDE:15.11  . COLONOSCOPY  last one 2016  approx.  . CYSTOSCOPY W/ URETERAL STENT PLACEMENT Bilateral 05/24/2017   Procedure: CYSTOSCOPY WITH RETROGRADE PYELOGRAM/ POSSIBLE URETERAL STENT PLACEMENT;  Surgeon: Franchot Gallo, MD;  Location[facility];  Service: Urology;  Laterality: Bilateral;   [phone] MEDICARE-[id number] [id number]  . CYSTOSCOPY WITH BIOPSY N/A 05/24/2017   Procedure: CYSTOSCOPY WITH BIOPSY;  Surgeon: Franchot Gallo, MD;  Location: [facility];  Service: Urology;  Laterality: N/A;  . CYSTOSCOPY WITH RETROGRADE PYELOGRAM, URETEROSCOPY AND STENT PLACEMENT Right 07/31/2014   Procedure: CYSTOSCOPY WITH RETROGRADE PYELOGRAM, URETEROSCOPY WITH FULGURATION AND STENT PLACEMENT;  Surgeon: [name], MD;  Location: [facility];  Service: Urology;  Laterality: Right;  . IVC FILTER INSERTION  07/28/2014  . LUMBAR LAMINECTOMY/DECOMPRESSION MICRODISCECTOMY  08/10/2012   Procedure: LUMBAR LAMINECTOMY/DECOMPRESSION MICRODISCECTOMY 1 LEVEL;  Surgeon: Winfield Cunas, MD;  Location: [facility];  Service: Neurosurgery;  Laterality: Left;  LEFT Lumbar five-sacral one microdiskectomy  . LUNG LOBECTOMY Left 06-11-1998   dr Cyndia Bent   LLL  . RADIOACTIVE PROSTATE SEED IMPLANTS  06-27-2008  dr Diona Fanti  at [facility]  . TESTICLE TORSION REDUCTION Right 1962  . TRANSTHORACIC ECHOCARDIOGRAM  07/15/2014   severe concentrial LVH, ef 67-61%, grade 1 diastolic dysfunction/  mild TR/  PASP 47mmHg  . VEIN LIGATION AND STRIPPING Right        Home Medications    Prior to Admission medications   Medication Sig Start Date End Date Taking? Authorizing Provider  allopurinol (ZYLOPRIM) 100 MG tablet Take 100 mg by mouth daily after breakfast.  07/23/12   [provider]  ALPRAZolam Duanne Moron) 0.5 MG tablet Take 0.5 mg by mouth 3 (three) times daily as needed for anxiety (anxiety).     [provider]  atorvastatin (LIPITOR) 40 MG tablet Take 1 tablet (40 mg total) by mouth every morning.  04/30/15   Martinique, Peter M, MD  Cyanocobalamin (VITAMIN B 12 PO) Take 1,000 mcg by mouth daily after breakfast.     [provider]  finasteride (PROSCAR) 5 MG tablet Take 5 mg by mouth daily after breakfast.     [provider]  glucosamine-chondroitin 500-400 MG tablet Take 1 tablet by mouth 2 (two) times daily.     [provider]  metFORMIN (GLUCOPHAGE-XR) 500 MG 24 hr tablet Take 1,000 mg by mouth every evening.  02/04/16   [provider]  nitroGLYCERIN (NITROSTAT) 0.4 MG SL tablet Place 0.4 mg under the tongue every 5 (five) minutes as needed. For chest pain    [provider]  oxybutynin (DITROPAN) 5 MG tablet Take 1 tablet (5 mg total) by mouth every 8 (eight) hours as needed for bladder spasms. 05/24/17   Franchot Gallo, MD  verapamil (CALAN-SR) 240 MG CR tablet Take 240 mg by mouth 2 (two) times daily.     [provider]    Family History  Family History  Problem Relation Age of Onset  . Heart disease Mother   . Hypertension Mother   . Heart disease Father   . Hypertension Father   . Stroke Father   . Other Brother        spinal meningitis  . Other Brother        MVA  . Cancer Sister        stomach    Social History Social History  Substance Use Topics  . Smoking status: Former Smoker    Packs/day: 2.00    Years: 35.00    Types: Cigarettes    Quit date: 10/17/1982  . Smokeless tobacco: Former Systems developer    Types: Chew    Quit date: 03/15/2012  . Alcohol use No     Comment: hx alcohol abuse     Allergies   Codeine and Ibuprofen   Review of Systems Review of Systems  Constitutional: Positive for fever.  Cardiovascular: Negative for chest pain.  Genitourinary: Positive for difficulty urinating. Negative for flank pain, frequency and genital sores.  Neurological: Negative for headaches.  All other systems reviewed and are negative.    Physical Exam Updated Vital Signs BP 139/66 (BP Location: Right Arm)    Pulse 83   Temp 99.9 F (37.7 C) (Oral)   Resp 18   SpO2 97%   Physical Exam  Constitutional: He is oriented to person, place, and time. He appears well-developed and well-nourished. No distress.  HENT:  Head: Normocephalic and atraumatic.  Right Ear: External ear normal.  Left Ear: External ear normal.  Mouth/Throat: No oropharyngeal exudate.  Eyes: Pupils are equal, round, and reactive to light. Conjunctivae are normal.  Neck: Normal range of motion. Neck supple.  Cardiovascular: Normal rate, regular rhythm, normal heart sounds and intact distal pulses.   Pulmonary/Chest: Effort normal and breath sounds normal. He has no wheezes. He has no rales.  Abdominal: Soft. Bowel sounds are normal. He exhibits distension. He exhibits no mass. There is no tenderness. There is no rebound and no guarding.  Neurological: He is alert and oriented to person, place, and time.  Skin: Skin is warm and dry. Capillary refill takes less than 2 seconds.  Psychiatric: He has a normal mood and affect.     ED Treatments / Results  Labs (all labs ordered are listed, but only abnormal results are displayed)  Results for orders placed or performed during the hospital encounter of 07/07/17  CBC with Differential/Platelet  Result Value Ref Range   WBC 9.7 4.0 - 10.5 K/uL   RBC 2.69 (L) 4.22 - 5.81 MIL/uL   Hemoglobin 9.1 (L) 13.0 - 17.0 g/dL   HCT 26.5 (L) 39.0 - 52.0 %   MCV 98.5 78.0 - 100.0 fL   MCH 33.8 26.0 - 34.0 pg   MCHC 34.3 30.0 - 36.0 g/dL   RDW 14.4 11.5 - 15.5 %   Platelets 156 150 - 400 K/uL   Neutrophils Relative % 66 %   Neutro Abs 6.5 1.7 - 7.7 K/uL   Lymphocytes Relative 19 %   Lymphs Abs 1.8 0.7 - 4.0 K/uL   Monocytes Relative 14 %   Monocytes Absolute 1.3 (H) 0.1 - 1.0 K/uL   Eosinophils Relative 1 %   Eosinophils Absolute 0.1 0.0 - 0.7 K/uL   Basophils Relative 0 %   Basophils Absolute 0.0 0.0 - 0.1 K/uL  Comprehensive metabolic panel  Result Value Ref Range   Sodium 131  (L) 135 - 145  mmol/L   Potassium 4.0 3.5 - 5.1 mmol/L   Chloride 100 (L) 101 - 111 mmol/L   CO2 21 (L) 22 - 32 mmol/L   Glucose, Bld 108 (H) 65 - 99 mg/dL   BUN 25 (H) 6 - 20 mg/dL   Creatinine, Ser 1.33 (H) 0.61 - 1.24 mg/dL   Calcium 8.5 (L) 8.9 - 10.3 mg/dL   Total Protein 6.7 6.5 - 8.1 g/dL   Albumin 3.5 3.5 - 5.0 g/dL   AST 14 (L) 15 - 41 U/L   ALT 9 (L) 17 - 63 U/L   Alkaline Phosphatase 42 38 - 126 U/L   Total Bilirubin 1.1 0.3 - 1.2 mg/dL   GFR calc non Af Amer 51 (L) >60 mL/min   GFR calc Af Amer 59 (L) >60 mL/min   Anion gap 10 5 - 15  Urinalysis, Routine w reflex microscopic  Result Value Ref Range   Color, Urine YELLOW YELLOW   APPearance HAZY (A) CLEAR   Specific Gravity, Urine 1.010 1.005 - 1.030   pH 5.0 5.0 - 8.0   Glucose, UA NEGATIVE NEGATIVE mg/dL   Hgb urine dipstick LARGE (A) NEGATIVE   Bilirubin Urine NEGATIVE NEGATIVE   Ketones, ur NEGATIVE NEGATIVE mg/dL   Protein, ur 100 (A) NEGATIVE mg/dL   Nitrite NEGATIVE NEGATIVE   Leukocytes, UA LARGE (A) NEGATIVE   RBC / HPF TOO NUMEROUS TO COUNT 0 - 5 RBC/hpf   WBC, UA TOO NUMEROUS TO COUNT 0 - 5 WBC/hpf   Bacteria, UA FEW (A) NONE SEEN   Squamous Epithelial / LPF 0-5 (A) NONE SEEN   WBC Clumps PRESENT    Mucus PRESENT    Dg Abd Acute W/chest  Result Date: 07/07/2017 CLINICAL DATA:  Abdominal pain and distention. No drainage from bladder catheter. History of coronary artery disease, myocardial infarct, pulmonary embolus, diabetes, prostate cancer, left lung cancer, and bladder cancer. EXAM: DG ABDOMEN ACUTE W/ 1V CHEST COMPARISON:  Abdomen 06/09/2017.  Chest 02/01/2016 FINDINGS: Borderline heart size with normal pulmonary vascularity. Linear fibrosis or atelectasis in the lung bases. Blunting of the right costophrenic angle may represent small effusion or thickened pleura. No pneumothorax. Calcification of the aorta. Degenerative changes in the spine and shoulders. Sclerosis in the proximal right humerus likely  representing an enchondroma. Scattered gas and stool in the colon without distention. Gas-filled left upper quadrant small bowel are at the upper limits of normal. This likely represents ileus although can't exclude early obstruction. Follow-up as clinically indicated. Bilateral ureteral stents are in place. An inferior vena caval filter is present. Seed implants over the prostate gland. No radiopaque stones. The bladder shadow does not appear grossly enlarged. Degenerative changes in the spine and hips. No destructive bone lesions are appreciated. IMPRESSION: 1. Linear fibrosis or atelectasis in the lung bases. Fluid or thickened pleura in the right costophrenic angle. 2. Gas-filled mildly prominent left upper quadrant small bowel likely due to ileus but can't exclude early obstruction. Follow-up as clinically indicated. 3. Bilateral ureteral stents. No radiopaque stones. Bladder shadow does not appear grossly enlarged. Electronically Signed   By: Lucienne Capers M.D.   On: 07/07/2017 01:51    EKG  EKG Interpretation None       Radiology Dg Abd Acute W/chest  Result Date: 07/07/2017 CLINICAL DATA:  Abdominal pain and distention. No drainage from bladder catheter. History of coronary artery disease, myocardial infarct, pulmonary embolus, diabetes, prostate cancer, left lung cancer, and bladder cancer. EXAM: DG ABDOMEN ACUTE W/  1V CHEST COMPARISON:  Abdomen 06/09/2017.  Chest 02/01/2016 FINDINGS: Borderline heart size with normal pulmonary vascularity. Linear fibrosis or atelectasis in the lung bases. Blunting of the right costophrenic angle may represent small effusion or thickened pleura. No pneumothorax. Calcification of the aorta. Degenerative changes in the spine and shoulders. Sclerosis in the proximal right humerus likely representing an enchondroma. Scattered gas and stool in the colon without distention. Gas-filled left upper quadrant small bowel are at the upper limits of normal. This likely  represents ileus although can't exclude early obstruction. Follow-up as clinically indicated. Bilateral ureteral stents are in place. An inferior vena caval filter is present. Seed implants over the prostate gland. No radiopaque stones. The bladder shadow does not appear grossly enlarged. Degenerative changes in the spine and hips. No destructive bone lesions are appreciated. IMPRESSION: 1. Linear fibrosis or atelectasis in the lung bases. Fluid or thickened pleura in the right costophrenic angle. 2. Gas-filled mildly prominent left upper quadrant small bowel likely due to ileus but can't exclude early obstruction. Follow-up as clinically indicated. 3. Bilateral ureteral stents. No radiopaque stones. Bladder shadow does not appear grossly enlarged. Electronically Signed   By: Lucienne Capers M.D.   On: 07/07/2017 01:51    Procedures Procedures (including critical care time)  Medications Ordered in ED Medications  sodium chloride 0.9 % bolus 500 mL (not administered)  tamsulosin (FLOMAX) capsule 0.4 mg (not administered)  sodium chloride 0.9 % bolus 500 mL (0 mLs Intravenous Stopped 07/07/17 0201)  lidocaine (XYLOCAINE) 2 % jelly (  Given 07/07/17 0145)       Final Clinical Impressions(s) / ED Diagnoses  Urinary retention UTI:   Strict return precautions given for  Shortness of breath, swelling or the lips or tongue, chest pain, dyspnea on exertion, new weakness or numbness changes in vision or speech,  Inability to tolerate liquids or food, changes in voice cough, altered mental status or any concerns. No signs of systemic illness or infection. The patient is nontoxic-appearing on exam and vital signs are within normal limits.    I have reviewed the triage vital signs and the nursing notes. Pertinent labs &imaging results that were available during my care of the patient were reviewed by me and considered in my medical decision making (see chart for details).  After history, exam, and  medical workup I feel the patient has been appropriately medically screened and is safe for discharge home. Pertinent diagnoses were discussed with the patient. Patient was given return precautions.    Jayon Matton, MD 07/07/17 [phone]

## 2017-07-07 NOTE — ED Notes (Signed)
Patient return from CT

## 2017-07-07 NOTE — ED Triage Notes (Signed)
Pt has bladder cancer and needs surgery but is having a hard time getting in contact with the surgeons to schedule anything Pt complains of not urinating and abdominal distention Pt has been catheterizing himself for several weeks

## 2017-07-07 NOTE — ED Notes (Signed)
Patient transported to CT 

## 2017-07-10 ENCOUNTER — Telehealth: Payer: Self-pay | Admitting: *Deleted

## 2017-07-10 NOTE — Telephone Encounter (Signed)
Daughter tanya states [facility] needs last office visit notes so they may discuss surgery options for patients. Faxed last o[facility]v. Note to [facility].

## 2017-07-13 DIAGNOSIS — Z23 Encounter for immunization: Secondary | ICD-10-CM | POA: Diagnosis not present

## 2017-07-18 ENCOUNTER — Other Ambulatory Visit: Payer: Self-pay | Admitting: Urology

## 2017-08-04 ENCOUNTER — Encounter (HOSPITAL_COMMUNITY): Payer: Medicare Other

## 2017-08-04 ENCOUNTER — Encounter: Payer: Self-pay | Admitting: *Deleted

## 2017-08-10 ENCOUNTER — Inpatient Hospital Stay (HOSPITAL_COMMUNITY)
Admission: RE | Admit: 2017-08-10 | Discharge: 2017-08-22 | DRG: 654 | Disposition: A | Discharge status: Home Health | Payer: Medicare Other | Source: Ambulatory Visit | Attending: Urology | Admitting: Urology

## 2017-08-10 ENCOUNTER — Encounter (HOSPITAL_COMMUNITY): Payer: Self-pay

## 2017-08-10 DIAGNOSIS — E871 Hypo-osmolality and hyponatremia: Secondary | ICD-10-CM | POA: Diagnosis not present

## 2017-08-10 DIAGNOSIS — N179 Acute kidney failure, unspecified: Secondary | ICD-10-CM | POA: Diagnosis present

## 2017-08-10 DIAGNOSIS — C670 Malignant neoplasm of trigone of bladder: Secondary | ICD-10-CM | POA: Diagnosis not present

## 2017-08-10 DIAGNOSIS — Z86711 Personal history of pulmonary embolism: Secondary | ICD-10-CM

## 2017-08-10 DIAGNOSIS — Q438 Other specified congenital malformations of intestine: Secondary | ICD-10-CM | POA: Diagnosis not present

## 2017-08-10 DIAGNOSIS — Z86718 Personal history of other venous thrombosis and embolism: Secondary | ICD-10-CM

## 2017-08-10 DIAGNOSIS — Z4682 Encounter for fitting and adjustment of non-vascular catheter: Secondary | ICD-10-CM | POA: Diagnosis not present

## 2017-08-10 DIAGNOSIS — C61 Malignant neoplasm of prostate: Secondary | ICD-10-CM | POA: Diagnosis present

## 2017-08-10 DIAGNOSIS — Z87891 Personal history of nicotine dependence: Secondary | ICD-10-CM | POA: Diagnosis not present

## 2017-08-10 DIAGNOSIS — M1A9XX0 Chronic gout, unspecified, without tophus (tophi): Secondary | ICD-10-CM | POA: Diagnosis present

## 2017-08-10 DIAGNOSIS — E669 Obesity, unspecified: Secondary | ICD-10-CM | POA: Diagnosis present

## 2017-08-10 DIAGNOSIS — N131 Hydronephrosis with ureteral stricture, not elsewhere classified: Secondary | ICD-10-CM | POA: Diagnosis present

## 2017-08-10 DIAGNOSIS — Z95818 Presence of other cardiac implants and grafts: Secondary | ICD-10-CM

## 2017-08-10 DIAGNOSIS — I252 Old myocardial infarction: Secondary | ICD-10-CM

## 2017-08-10 DIAGNOSIS — Z809 Family history of malignant neoplasm, unspecified: Secondary | ICD-10-CM

## 2017-08-10 DIAGNOSIS — E119 Type 2 diabetes mellitus without complications: Secondary | ICD-10-CM | POA: Diagnosis present

## 2017-08-10 DIAGNOSIS — Z888 Allergy status to other drugs, medicaments and biological substances status: Secondary | ICD-10-CM

## 2017-08-10 DIAGNOSIS — M069 Rheumatoid arthritis, unspecified: Secondary | ICD-10-CM | POA: Diagnosis present

## 2017-08-10 DIAGNOSIS — N200 Calculus of kidney: Secondary | ICD-10-CM | POA: Diagnosis not present

## 2017-08-10 DIAGNOSIS — Z923 Personal history of irradiation: Secondary | ICD-10-CM

## 2017-08-10 DIAGNOSIS — Z4659 Encounter for fitting and adjustment of other gastrointestinal appliance and device: Secondary | ICD-10-CM

## 2017-08-10 DIAGNOSIS — Z960 Presence of urogenital implants: Secondary | ICD-10-CM | POA: Diagnosis present

## 2017-08-10 DIAGNOSIS — Z902 Acquired absence of lung [part of]: Secondary | ICD-10-CM

## 2017-08-10 DIAGNOSIS — D630 Anemia in neoplastic disease: Secondary | ICD-10-CM | POA: Diagnosis present

## 2017-08-10 DIAGNOSIS — E878 Other disorders of electrolyte and fluid balance, not elsewhere classified: Secondary | ICD-10-CM | POA: Diagnosis not present

## 2017-08-10 DIAGNOSIS — I2510 Atherosclerotic heart disease of native coronary artery without angina pectoris: Secondary | ICD-10-CM | POA: Diagnosis present

## 2017-08-10 DIAGNOSIS — Z886 Allergy status to analgesic agent status: Secondary | ICD-10-CM

## 2017-08-10 DIAGNOSIS — K567 Ileus, unspecified: Secondary | ICD-10-CM

## 2017-08-10 DIAGNOSIS — R140 Abdominal distension (gaseous): Secondary | ICD-10-CM | POA: Diagnosis not present

## 2017-08-10 DIAGNOSIS — Z85118 Personal history of other malignant neoplasm of bronchus and lung: Secondary | ICD-10-CM

## 2017-08-10 DIAGNOSIS — E7800 Pure hypercholesterolemia, unspecified: Secondary | ICD-10-CM | POA: Diagnosis present

## 2017-08-10 DIAGNOSIS — Z8249 Family history of ischemic heart disease and other diseases of the circulatory system: Secondary | ICD-10-CM

## 2017-08-10 DIAGNOSIS — E873 Alkalosis: Secondary | ICD-10-CM | POA: Diagnosis not present

## 2017-08-10 DIAGNOSIS — Z6829 Body mass index (BMI) 29.0-29.9, adult: Secondary | ICD-10-CM

## 2017-08-10 DIAGNOSIS — I10 Essential (primary) hypertension: Secondary | ICD-10-CM | POA: Diagnosis present

## 2017-08-10 DIAGNOSIS — C679 Malignant neoplasm of bladder, unspecified: Secondary | ICD-10-CM | POA: Diagnosis present

## 2017-08-10 LAB — COMPREHENSIVE METABOLIC PANEL
ALK PHOS: 55 U/L (ref 38–126)
ALT: 9 U/L — ABNORMAL LOW (ref 17–63)
ANION GAP: 11 (ref 5–15)
AST: 14 U/L — ABNORMAL LOW (ref 15–41)
Albumin: 3.7 g/dL (ref 3.5–5.0)
BILIRUBIN TOTAL: 0.6 mg/dL (ref 0.3–1.2)
BUN: 10 mg/dL (ref 6–20)
CALCIUM: 9.4 mg/dL (ref 8.9–10.3)
CO2: 26 mmol/L (ref 22–32)
Chloride: 101 mmol/L (ref 101–111)
Creatinine, Ser: 0.83 mg/dL (ref 0.61–1.24)
GFR calc Af Amer: >60 mL/min (ref >60)
Glucose, Bld: 121 mg/dL — ABNORMAL HIGH (ref 65–99)
POTASSIUM: 3.8 mmol/L (ref 3.5–5.1)
Sodium: 138 mmol/L (ref 135–145)
TOTAL PROTEIN: 7.6 g/dL (ref 6.5–8.1)

## 2017-08-10 LAB — CBC
HCT: 31.2 % — ABNORMAL LOW (ref 39.0–52.0)
HEMOGLOBIN: 10.4 g/dL — AB (ref 13.0–17.0)
MCH: 32.1 pg (ref 26.0–34.0)
MCHC: 33.3 g/dL (ref 30.0–36.0)
MCV: 96.3 fL (ref 78.0–100.0)
Platelets: 249 10*3/uL (ref 150–400)
RBC: 3.24 MIL/uL — AB (ref 4.22–5.81)
RDW: 13.9 % (ref 11.5–15.5)
WBC: 6.7 10*3/uL (ref 4.0–10.5)

## 2017-08-10 LAB — PREPARE RBC (CROSSMATCH)

## 2017-08-10 MED ORDER — TRAMADOL HCL 50 MG PO TABS
50.0000 mg | ORAL_TABLET | Freq: Four times a day (QID) | ORAL | Status: DC | PRN
  Administered 2017-08-10 – 2017-08-13 (×6): 50 mg via ORAL
  Filled 2017-08-10 (×6): qty 1

## 2017-08-10 MED ORDER — VERAPAMIL HCL ER 240 MG PO TBCR
240.0000 mg | EXTENDED_RELEASE_TABLET | Freq: Two times a day (BID) | ORAL | Status: DC
  Administered 2017-08-10 – 2017-08-18 (×13): 240 mg via ORAL
  Filled 2017-08-10 (×21): qty 1

## 2017-08-10 MED ORDER — METRONIDAZOLE 500 MG PO TABS
500.0000 mg | ORAL_TABLET | Freq: Four times a day (QID) | ORAL | Status: AC
  Administered 2017-08-10 – 2017-08-11 (×3): 500 mg via ORAL
  Filled 2017-08-10 (×3): qty 1

## 2017-08-10 MED ORDER — NEOMYCIN SULFATE 500 MG PO TABS
1000.0000 mg | ORAL_TABLET | Freq: Three times a day (TID) | ORAL | Status: AC
  Administered 2017-08-10 (×2): 1000 mg via ORAL
  Filled 2017-08-10 (×3): qty 2

## 2017-08-10 MED ORDER — BOOST / RESOURCE BREEZE PO LIQD
1.0000 | Freq: Three times a day (TID) | ORAL | Status: DC
  Administered 2017-08-10 – 2017-08-22 (×15): 1 via ORAL

## 2017-08-10 MED ORDER — PEG 3350-KCL-NA BICARB-NACL 420 G PO SOLR
4000.0000 mL | Freq: Once | ORAL | Status: AC
  Administered 2017-08-10: 4000 mL via ORAL
  Filled 2017-08-10: qty 4000

## 2017-08-10 MED ORDER — ALLOPURINOL 100 MG PO TABS
100.0000 mg | ORAL_TABLET | Freq: Every day | ORAL | Status: DC
  Administered 2017-08-12 – 2017-08-22 (×11): 100 mg via ORAL
  Filled 2017-08-10 (×12): qty 1

## 2017-08-10 MED ORDER — ATORVASTATIN CALCIUM 40 MG PO TABS
40.0000 mg | ORAL_TABLET | Freq: Every morning | ORAL | Status: DC
  Administered 2017-08-12 – 2017-08-22 (×11): 40 mg via ORAL
  Filled 2017-08-10 (×12): qty 1

## 2017-08-10 MED ORDER — PIPERACILLIN-TAZOBACTAM 3.375 G IVPB 30 MIN
3.3750 g | INTRAVENOUS | Status: AC
  Administered 2017-08-11: 3.375 g via INTRAVENOUS
  Filled 2017-08-10: qty 50

## 2017-08-10 MED ORDER — ALPRAZOLAM 0.5 MG PO TABS
0.5000 mg | ORAL_TABLET | Freq: Three times a day (TID) | ORAL | Status: DC | PRN
  Administered 2017-08-10 – 2017-08-15 (×2): 0.5 mg via ORAL
  Filled 2017-08-10 (×3): qty 1

## 2017-08-10 NOTE — Consult Note (Signed)
[facility] Nurse ostomy consult note [place] Nurse requested for preoperative stoma site marking by Dr. Tresa Moore.  DOS is tomorrow, 08/11/17.  Discussed surgical procedure and stoma creation with patient and wife. His daughter was in the room briefly, but did not stay for the session.  Explained role of the [place] nurse team.  Provided the patient with educational booklet.  Answered patient and family questions.   Examined patient lying, sitting, and standing in order to place the marking in the patient's visual field, away from any creases or abdominal contour issues and within the rectus muscle.  Attempted to mark below the patient's belt line, but his body habitus warrants a RUQ stoma.   Marked for ileal conduit in the RUQ 6cm to the right to the right of the umbilicus and  4.8AX above the umbilicus.   Patient's abdomen cleansed with CHG wipes at site markings, allowed to air dry prior to marking.Covered mark with thin film transparent dressing to preserve mark until date of surgery.   [facility] [facility] nursing team will follow, and will remain available to this patient, the nursing and medical teams.  Thanks, Maudie Flakes, MSN, RN, [place], Arther Abbott  Pager# [phone]

## 2017-08-10 NOTE — H&P (Signed)
Daniel Vega is an 76 y.o. male.    Chief Complaint: Pre-Op Cystoprostatectomy  HPI:   1 - Stage 2-4 Bladder Cancer - high grade bladder cancer with bilateral hydro involving bladder neck smooth muscle by TURBT 05/2017 on eval new hydro and hematuria. CT 04/2017 w/o locally advanced or distant disease. Had bilateral JJ stents placed 05/2017. Had opinion form Dr. Alen Blew who is concerned about possible chemoresistance given h/o brachytherapy and rec'd up front cystecotmy for this and due to his impressive local sympomts as he is stent and catheter dependant.   Restaging CT 06/2017 without additional disease burden.   2 - Prostate Cancer - s/p brachytherapy years ago. PSA 2018 <0.015.   3 - Acute Renal Failure / Bilateral Ureteral Obstruction - Cr 2's 04/2017 up from baseline 1.1 with bilateral hydro from malignant obstruction as per above. Now stented with most recent Cr <1.5.   4 - Lower Urinary Tract Symptoms - on finasteride x years with good control of obstructive LUTS.   PMH sig for CAD, Gout, DVT/PE/IVC Filter, . His PCP is [name] MD.   Today "[name]" is seen as pre-op admission for bowel prep, labs, stomal marking, prior to cystoprostatectomy + urethrectomy tomorrow. NO interval fevers.     Past Medical History:  Diagnosis Date  . CAD (coronary artery disease)    CARDIOLOGIST-  DR Martinique-- hx MI 1994, medically treated;  cath 1997 dRCA occlusion w/ collateral flow  . Chronic gout    as of 05-23-2017 -- per pt stable   . Dysuria   . Elevated ferritin level    followed by dr Alen Blew ([facility])-- per last note differiential dx hx alcohol use versus questionable hemochromatosis  . Full dentures   . Hematuria    chronic  . History of adenomatous polyp of colon   . History of kidney stones    multiple-  calcium oxalate  . History of MI (myocardial infarction)    remote hx 1994-- treated medically  . History of pleural effusion 07/2014   in setting acute blood loss  taking xarelto for PE--- s/p  right pleural thoracentesis w/ 600cc  . History of prostate cancer    urologist-  dr Diona Fanti---  primary adenocarcinoma-- Gleason 3+3--  06-27-2008  s/p  radioactive prostate seed implants  . History of pulmonary embolism 07/14/2014   w/  RLL pulmary infarction  . History of pulmonary infarction 07/14/2014   RLL due to PE  . History of recurrent pneumonia   . History of urinary retention    10/ 2015 gross hematuria / clots due to taking xarelto  . Hydronephrosis of right kidney   . Hypercholesteremia   . Hypertension   . Nocturia   . OA (osteoarthritis)    joints  . Personal history of lung cancer 05/1998   adenosquamous left lower lobe primary carcinoma--- s/p left lower lobectomy 08/ 1999 /  per last CT no recurrence  . RA (rheumatoid arthritis) (HCC)    hands  . Renal insufficiency   . S/P IVC filter placed 07-28-2014  . Type 2 diabetes mellitus ([place])   . Wears glasses     Past Surgical History:  Procedure Laterality Date  . CARDIAC CATHETERIZATION  1997  dr [place]  . CARDIAC CATHETERIZATION  02-11-2011  dr Martinique   (abnormal stress echo) single vessel occlusive artherosclerotic CAD involving the distal RCA w/ excellent collateral flow;  good LVSF, ef 55%  . CATARACT EXTRACTION W/PHACO Left 12/10/2015  Procedure: CATARACT EXTRACTION PHACO AND INTRAOCULAR LENS PLACEMENT LEFT EYE CDE=10.98;  Surgeon: Tonny Branch, MD;  Location: [facility];  Service: Ophthalmology;  Laterality: Left;  . CATARACT EXTRACTION W/PHACO Right 01/07/2016   Procedure: CATARACT EXTRACTION PHACO AND INTRAOCULAR LENS PLACEMENT (IOC);  Surgeon: Tonny Branch, MD;  Location: [facility];  Service: Ophthalmology;  Laterality: Right;  CDE:15.11  . COLONOSCOPY  last one 2016 approx.  . CYSTOSCOPY W/ URETERAL STENT PLACEMENT Bilateral 05/24/2017   Procedure: CYSTOSCOPY WITH RETROGRADE PYELOGRAM/ POSSIBLE URETERAL STENT PLACEMENT;  Surgeon: Franchot Gallo, MD;  Location: [facility];  Service: Urology;  Laterality: Bilateral;   [id number] MEDICARE-[id number] BCBS-[id number]  . CYSTOSCOPY WITH BIOPSY N/A 05/24/2017   Procedure: CYSTOSCOPY WITH BIOPSY;  Surgeon: Franchot Gallo, MD;  Location: [facility];  Service: Urology;  Laterality: N/A;  . CYSTOSCOPY WITH RETROGRADE PYELOGRAM, URETEROSCOPY AND STENT PLACEMENT Right 07/31/2014   Procedure: CYSTOSCOPY WITH RETROGRADE PYELOGRAM, URETEROSCOPY WITH FULGURATION AND STENT PLACEMENT;  Surgeon: [name], MD;  Location: [facility];  Service: Urology;  Laterality: Right;  . IVC FILTER INSERTION  07/28/2014  . LUMBAR LAMINECTOMY/DECOMPRESSION MICRODISCECTOMY  08/10/2012   Procedure: LUMBAR LAMINECTOMY/DECOMPRESSION MICRODISCECTOMY 1 LEVEL;  Surgeon: Winfield Cunas, MD;  Location: [facility] [facility];  Service: Neurosurgery;  Laterality: Left;  LEFT Lumbar five-sacral one microdiskectomy  . LUNG LOBECTOMY Left 06-11-1998   dr Cyndia Bent   LL[facility]  . RADIOACTIVE PROSTATE SEED IMPLANTS  06-27-2008  dr Diona Fanti  at [facility]  . TESTICLE TORSION REDUCTION Right 1962  . TRANSTHORACIC ECHOCARDIOGRAM  07/15/2014   severe concentrial LVH, ef 13-24%, grade 1 diastolic dysfunction/  mild TR/  PASP 21mmHg  . VEIN LIGATION AND STRIPPING Right     Family History  Problem Relation Age of Onset  . Heart disease Mother   . Hypertension Mother   . Heart disease Father   . Hypertension Father   . Stroke Father   . Other Brother        spinal meningitis  . Other Brother        MVA  . Cancer Sister        stomach   Social History:  reports that he quit smoking about 34 years ago. His smoking use included Cigarettes. He has a 70.00 pack-year smoking history. He quit smokeless tobacco use about 5 years ago. His smokeless tobacco use included Chew. He reports that he does not drink alcohol or use drugs.  Allergies:  Allergies  Allergen Reactions  . Codeine Palpitations    Made heart hurt  . Ibuprofen Palpitations     Medications Prior to Admission  Medication Sig Dispense Refill  . acetaminophen (TYLENOL) 500 MG tablet Take 1,000 mg by mouth every 6 (six) hours as needed for moderate pain.    Marland Kitchen allopurinol (ZYLOPRIM) 100 MG tablet Take 100 mg by mouth daily after breakfast.     . ALPRAZolam (XANAX) 0.5 MG tablet Take 0.5 mg by mouth 3 (three) times daily as needed for anxiety (anxiety).     Marland Kitchen atorvastatin (LIPITOR) 40 MG tablet Take 1 tablet (40 mg total) by mouth every morning. 90 tablet 3  . finasteride (PROSCAR) 5 MG tablet Take 5 mg by mouth daily after breakfast.     . nitroGLYCERIN (NITROSTAT) 0.4 MG SL tablet Place 0.4 mg under the tongue every 5 (five) minutes as needed for chest pain. For chest pain    . traMADol (ULTRAM) 50 MG tablet Take 50 mg by mouth every 6 (six)  hours as needed (for pain.).    Marland Kitchen verapamil (CALAN-SR) 240 MG CR tablet Take 240 mg by mouth 2 (two) times daily.     . cephALEXin (KEFLEX) 500 MG capsule Take 1 capsule (500 mg total) by mouth 4 (four) times daily. (Patient not taking: Reported on 08/04/2017) 28 capsule 0  . glucosamine-chondroitin 500-400 MG tablet Take 1 tablet by mouth 2 (two) times daily.     Marland Kitchen oxybutynin (DITROPAN XL) 10 MG 24 hr tablet Take 1 tablet (10 mg total) by mouth at bedtime. (Patient not taking: Reported on 08/04/2017) 10 tablet 0  . vitamin B-12 (CYANOCOBALAMIN) 1000 MCG tablet Take 1,000 mcg by mouth daily.      No results found for this or any previous visit (from the past 48 hour(s)). No results found.  Review of Systems  Constitutional: Negative.  Negative for chills and fever.  HENT: Negative.   Eyes: Negative.   Respiratory: Negative.   Cardiovascular: Negative.   Gastrointestinal: Negative.   Genitourinary: Positive for frequency, hematuria and urgency.  Musculoskeletal: Negative.   Skin: Negative.   Neurological: Negative.   Endo/Heme/Allergies: Negative.   Psychiatric/Behavioral: Negative.     There were no vitals taken for  this visit. Physical Exam  Constitutional: He appears well-developed.  HENT:  Head: Normocephalic.  Eyes: Pupils are equal, round, and reactive to light.  Neck: Normal range of motion.  Cardiovascular: Normal rate.   Respiratory: Effort normal.  GI:  Stable moderate truncal obesity.   Genitourinary:  Genitourinary Comments: No CVAT. Foley in place with yellow urine at present.   Neurological: He is alert.  Skin: Skin is warm.  Psychiatric: He has a normal mood and affect. His behavior is normal.     Assessment/Plan  Proceed as planned with cystoprostatectomy with total urethrectomy + ICG sentinal + template pelvic lymphadenectomy and ileal conduit urinary diversion tomorrow.  Risks, benefits, alternatives, expected peri-op course with about 7 day hospitalization and home with [facility] if smooth post-op course Bowel prep, clears, CMP, CBC, T+C, stomal marking today.   Alexis Frock, MD 08/10/2017, 10:29 AM

## 2017-08-10 NOTE — Progress Notes (Signed)
Patient completed bowel prep around 1700.  IV placed and consent has been signed. Patient reports clear liquid stool per rectum at this time.  Will continue to monitor.

## 2017-08-10 NOTE — Anesthesia Preprocedure Evaluation (Addendum)
Anesthesia Evaluation  Patient identified by MRN, date of birth, ID band Patient awake    Reviewed: Allergy & Precautions, NPO status , Patient's Chart, lab work & pertinent test results  Airway Mallampati: II  TM Distance: <3 FB Neck ROM: Full    Dental  (+) Dental Advisory Given, Edentulous Lower, Edentulous Upper   Pulmonary former smoker, PE S/p left lower lobectomy for lung cancer   Pulmonary exam normal breath sounds clear to auscultation       Cardiovascular hypertension, Pt. on medications (-) angina+ CAD and + Past MI  Normal cardiovascular exam Rhythm:Regular Rate:Normal  S/p IVC filter  Echo 06/2014: Impressions:  - LVEF 60-65%, severe concentric LVH, normal wall motion, diastolicdysfunction, indeterminate LV filling pressure, borderlinepulmonary hypertension.   Neuro/Psych negative neurological ROS  negative psych ROS   GI/Hepatic negative GI ROS, Neg liver ROS,   Endo/Other  diabetes, Type 2, Oral Hypoglycemic Agents  Renal/GU Renal InsufficiencyRenal disease   Bladder cancer    Musculoskeletal  (+) Arthritis , Osteoarthritis and Rheumatoid disorders,    Abdominal   Peds  Hematology  (+) Blood dyscrasia, anemia ,   Anesthesia Other Findings Day of surgery medications reviewed with the patient.  Reproductive/Obstetrics                            Anesthesia Physical Anesthesia Plan  ASA: III  Anesthesia Plan: General   Post-op Pain Management:    Induction: Intravenous  PONV Risk Score and Plan: 2 and Ondansetron and Dexamethasone  Airway Management Planned: Oral ETT  Additional Equipment: Arterial line  Intra-op Plan:   Post-operative Plan: Extubation in OR  Informed Consent: I have reviewed the patients History and Physical, chart, labs and discussed the procedure including the risks, benefits and alternatives for the proposed anesthesia with the patient or  authorized representative who has indicated his/her understanding and acceptance.   Dental advisory given  Plan Discussed with:   Anesthesia Plan Comments: (Arterial line, 2nd PIV after induction.)        Anesthesia Quick Evaluation

## 2017-08-11 ENCOUNTER — Inpatient Hospital Stay (HOSPITAL_COMMUNITY): Payer: Medicare Other | Admitting: Certified Registered Nurse Anesthetist

## 2017-08-11 ENCOUNTER — Encounter (HOSPITAL_COMMUNITY)
Admission: RE | Disposition: A | Discharge status: Home Health | Payer: Self-pay | Source: Ambulatory Visit | Attending: Urology

## 2017-08-11 ENCOUNTER — Encounter (HOSPITAL_COMMUNITY): Payer: Self-pay | Admitting: Anesthesiology

## 2017-08-11 HISTORY — PX: CYSTOSCOPY WITH INJECTION: SHX1424

## 2017-08-11 LAB — GLUCOSE, CAPILLARY: GLUCOSE-CAPILLARY: 178 mg/dL — AB (ref 65–99)

## 2017-08-11 LAB — SURGICAL PCR SCREEN
MRSA, PCR: NEGATIVE
STAPHYLOCOCCUS AUREUS: NEGATIVE

## 2017-08-11 SURGERY — ROBOT ASSISTED LAPAROSCOPIC RADICAL CYSTOPROSTATECTOMY BILATERAL PELVIC LYMPHADENECTOMY,ORTHOTOPIC NEOBLADDER
Anesthesia: General

## 2017-08-11 MED ORDER — FENTANYL CITRATE (PF) 250 MCG/5ML IJ SOLN
INTRAMUSCULAR | Status: AC
  Filled 2017-08-11: qty 5

## 2017-08-11 MED ORDER — LACTATED RINGERS IV SOLN: INTRAVENOUS | Status: DC

## 2017-08-11 MED ORDER — LIDOCAINE 2% (20 MG/ML) 5 ML SYRINGE
INTRAMUSCULAR | Status: AC
  Filled 2017-08-11: qty 5

## 2017-08-11 MED ORDER — PIPERACILLIN-TAZOBACTAM IN DEX 2-0.25 GM/50ML IV SOLN
2.2500 g | Freq: Three times a day (TID) | INTRAVENOUS | Status: AC
  Administered 2017-08-11 – 2017-08-12 (×5): 2.25 g via INTRAVENOUS
  Filled 2017-08-11 (×6): qty 50

## 2017-08-11 MED ORDER — PIPERACILLIN-TAZOBACTAM 3.375 G IVPB
INTRAVENOUS | Status: AC
  Filled 2017-08-11: qty 50

## 2017-08-11 MED ORDER — SUGAMMADEX SODIUM 200 MG/2ML IV SOLN
INTRAVENOUS | Status: AC
  Filled 2017-08-11: qty 2

## 2017-08-11 MED ORDER — ONDANSETRON HCL 4 MG/2ML IJ SOLN: 4.0000 mg | Freq: Four times a day (QID) | INTRAMUSCULAR | Status: DC | PRN

## 2017-08-11 MED ORDER — SUGAMMADEX SODIUM 200 MG/2ML IV SOLN
INTRAVENOUS | Status: DC | PRN
  Administered 2017-08-11: 160 mg via INTRAVENOUS

## 2017-08-11 MED ORDER — PROPOFOL 10 MG/ML IV BOLUS
INTRAVENOUS | Status: AC
  Filled 2017-08-11: qty 20

## 2017-08-11 MED ORDER — LACTATED RINGERS IV SOLN
INTRAVENOUS | Status: DC | PRN
  Administered 2017-08-11 (×2): via INTRAVENOUS

## 2017-08-11 MED ORDER — ROCURONIUM BROMIDE 50 MG/5ML IV SOSY
PREFILLED_SYRINGE | INTRAVENOUS | Status: AC
  Filled 2017-08-11: qty 5

## 2017-08-11 MED ORDER — ROCURONIUM BROMIDE 50 MG/5ML IV SOSY
PREFILLED_SYRINGE | INTRAVENOUS | Status: AC
  Filled 2017-08-11: qty 10

## 2017-08-11 MED ORDER — HYDROMORPHONE HCL 2 MG/ML IJ SOLN
INTRAMUSCULAR | Status: AC
  Filled 2017-08-11: qty 1

## 2017-08-11 MED ORDER — SODIUM CHLORIDE 0.9 % IV SOLN: INTRAVENOUS | Status: DC

## 2017-08-11 MED ORDER — DEXTROSE-NACL 5-0.45 % IV SOLN
INTRAVENOUS | Status: DC
  Administered 2017-08-11 – 2017-08-18 (×8): via INTRAVENOUS

## 2017-08-11 MED ORDER — HYDROMORPHONE 1 MG/ML IV SOLN
INTRAVENOUS | Status: DC
  Administered 2017-08-11: 0.3 mg via INTRAVENOUS
  Administered 2017-08-11: 0 mg via INTRAVENOUS
  Administered 2017-08-12: 1.5 mg via INTRAVENOUS
  Administered 2017-08-12: 1.2 mg via INTRAVENOUS
  Filled 2017-08-11: qty 25

## 2017-08-11 MED ORDER — DIPHENHYDRAMINE HCL 50 MG/ML IJ SOLN: 12.5000 mg | Freq: Four times a day (QID) | INTRAMUSCULAR | Status: DC | PRN

## 2017-08-11 MED ORDER — BUPIVACAINE LIPOSOME 1.3 % IJ SUSP
20.0000 mL | Freq: Once | INTRAMUSCULAR | Status: AC
Start: 2017-08-11 — End: 2017-08-11
  Administered 2017-08-11: 20 mL
  Filled 2017-08-11: qty 20

## 2017-08-11 MED ORDER — ONDANSETRON HCL 4 MG/2ML IJ SOLN
INTRAMUSCULAR | Status: DC | PRN
  Administered 2017-08-11: 4 mg via INTRAVENOUS

## 2017-08-11 MED ORDER — ONDANSETRON HCL 4 MG/2ML IJ SOLN
INTRAMUSCULAR | Status: AC
  Filled 2017-08-11: qty 2

## 2017-08-11 MED ORDER — PROPOFOL 10 MG/ML IV BOLUS
INTRAVENOUS | Status: DC | PRN
  Administered 2017-08-11: 150 mg via INTRAVENOUS

## 2017-08-11 MED ORDER — STERILE WATER FOR IRRIGATION IR SOLN
Status: DC | PRN
  Administered 2017-08-11: 1000 mL

## 2017-08-11 MED ORDER — DIPHENHYDRAMINE HCL 12.5 MG/5ML PO ELIX
12.5000 mg | ORAL_SOLUTION | Freq: Four times a day (QID) | ORAL | Status: DC | PRN
  Filled 2017-08-11: qty 5

## 2017-08-11 MED ORDER — SODIUM CHLORIDE 0.9% FLUSH: 9.0000 mL | INTRAVENOUS | Status: DC | PRN

## 2017-08-11 MED ORDER — FENTANYL CITRATE (PF) 100 MCG/2ML IJ SOLN
INTRAMUSCULAR | Status: DC | PRN
  Administered 2017-08-11 (×7): 50 ug via INTRAVENOUS
  Administered 2017-08-11: 100 ug via INTRAVENOUS
  Administered 2017-08-11: 50 ug via INTRAVENOUS

## 2017-08-11 MED ORDER — EPHEDRINE 5 MG/ML INJ
INTRAVENOUS | Status: AC
  Filled 2017-08-11: qty 10

## 2017-08-11 MED ORDER — PHENYLEPHRINE HCL 10 MG/ML IJ SOLN
INTRAMUSCULAR | Status: AC
  Filled 2017-08-11: qty 1

## 2017-08-11 MED ORDER — NALOXONE HCL 0.4 MG/ML IJ SOLN: 0.4000 mg | INTRAMUSCULAR | Status: DC | PRN

## 2017-08-11 MED ORDER — LACTATED RINGERS IR SOLN
Status: DC | PRN
  Administered 2017-08-11: 1000 mL

## 2017-08-11 MED ORDER — ROCURONIUM BROMIDE 10 MG/ML (PF) SYRINGE
PREFILLED_SYRINGE | INTRAVENOUS | Status: DC | PRN
  Administered 2017-08-11: 50 mg via INTRAVENOUS
  Administered 2017-08-11 (×2): 20 mg via INTRAVENOUS
  Administered 2017-08-11 (×2): 10 mg via INTRAVENOUS
  Administered 2017-08-11: 20 mg via INTRAVENOUS

## 2017-08-11 MED ORDER — HYDROMORPHONE HCL 1 MG/ML IJ SOLN
INTRAMUSCULAR | Status: DC | PRN
  Administered 2017-08-11 (×2): 1 mg via INTRAVENOUS

## 2017-08-11 MED ORDER — LIDOCAINE 2% (20 MG/ML) 5 ML SYRINGE
INTRAMUSCULAR | Status: DC | PRN
  Administered 2017-08-11: 50 mg via INTRAVENOUS

## 2017-08-11 MED ORDER — LACTATED RINGERS IV SOLN
INTRAVENOUS | Status: DC
  Administered 2017-08-11: 07:00:00 via INTRAVENOUS

## 2017-08-11 MED ORDER — PHENYLEPHRINE HCL 10 MG/ML IJ SOLN
INTRAVENOUS | Status: DC | PRN
  Administered 2017-08-11: 40 ug/min via INTRAVENOUS

## 2017-08-11 MED ORDER — DEXAMETHASONE SODIUM PHOSPHATE 10 MG/ML IJ SOLN
INTRAMUSCULAR | Status: AC
  Filled 2017-08-11: qty 1

## 2017-08-11 MED ORDER — LACTATED RINGERS IV SOLN
INTRAVENOUS | Status: DC | PRN
  Administered 2017-08-11: 07:00:00 via INTRAVENOUS

## 2017-08-11 MED ORDER — LACTATED RINGERS IV SOLN
INTRAVENOUS | Status: DC
Start: 2017-08-11 — End: 2017-08-11

## 2017-08-11 MED ORDER — SODIUM CHLORIDE 0.9 % IJ SOLN
INTRAMUSCULAR | Status: AC
  Filled 2017-08-11: qty 20

## 2017-08-11 MED ORDER — DEXAMETHASONE SODIUM PHOSPHATE 10 MG/ML IJ SOLN
INTRAMUSCULAR | Status: DC | PRN
  Administered 2017-08-11: 10 mg via INTRAVENOUS

## 2017-08-11 MED ORDER — SODIUM CHLORIDE 0.9 % IJ SOLN
INTRAMUSCULAR | Status: DC | PRN
  Administered 2017-08-11: 20 mL

## 2017-08-11 MED ORDER — EPHEDRINE SULFATE-NACL 50-0.9 MG/10ML-% IV SOSY
PREFILLED_SYRINGE | INTRAVENOUS | Status: DC | PRN
  Administered 2017-08-11 (×2): 10 mg via INTRAVENOUS

## 2017-08-11 SURGICAL SUPPLY — 101 items
ADH SKN CLS APL DERMABOND .7 (GAUZE/BANDAGES/DRESSINGS) ×4
AGENT HMST KT MTR STRL THRMB (HEMOSTASIS)
APL ESCP 34 STRL LF DISP (HEMOSTASIS)
APL SKNCLS STERI-STRIP NONHPOA (GAUZE/BANDAGES/DRESSINGS) ×2
APPLICATOR COTTON TIP 6IN STRL (MISCELLANEOUS) ×4 IMPLANT
APPLICATOR SURGIFLO ENDO (HEMOSTASIS) IMPLANT
BAG LAPAROSCOPIC 12 15 PORT 16 (BASKET) ×2 IMPLANT
BAG RETRIEVAL 12/15 (BASKET) ×3
BAG RETRIEVAL 12/15MM (BASKET) ×1
BAG URO CATCHER STRL LF (MISCELLANEOUS) ×2 IMPLANT
BENZOIN TINCTURE PRP APPL 2/3 (GAUZE/BANDAGES/DRESSINGS) ×2 IMPLANT
BLADE SURG SZ10 CARB STEEL (BLADE) IMPLANT
CATH FOLEY 2WAY SLVR  5CC 16FR (CATHETERS) ×2
CATH FOLEY 2WAY SLVR 18FR 30CC (CATHETERS) ×4 IMPLANT
CATH FOLEY 2WAY SLVR 5CC 16FR (CATHETERS) IMPLANT
CELLS DAT CNTRL 66122 CELL SVR (MISCELLANEOUS) ×2 IMPLANT
CHLORAPREP W/TINT 26ML (MISCELLANEOUS) ×4 IMPLANT
CLIP VESOLOCK LG 6/CT PURPLE (CLIP) ×14 IMPLANT
CLIP VESOLOCK MED LG 6/CT (CLIP) ×4 IMPLANT
CLIP VESOLOCK XL 6/CT (CLIP) ×10 IMPLANT
CLOTH BEACON ORANGE TIMEOUT ST (SAFETY) ×4 IMPLANT
CONT SPEC 4OZ CLIKSEAL STRL BL (MISCELLANEOUS) ×2 IMPLANT
COVER FOOTSWITCH UNIV (MISCELLANEOUS) IMPLANT
COVER SURGICAL LIGHT HANDLE (MISCELLANEOUS) ×4 IMPLANT
COVER TIP SHEARS 8 DVNC (MISCELLANEOUS) ×2 IMPLANT
COVER TIP SHEARS 8MM DA VINCI (MISCELLANEOUS) ×2
DECANTER SPIKE VIAL GLASS SM (MISCELLANEOUS) ×4 IMPLANT
DERMABOND ADVANCED (GAUZE/BANDAGES/DRESSINGS) ×4
DERMABOND ADVANCED .7 DNX12 (GAUZE/BANDAGES/DRESSINGS) ×4 IMPLANT
DRAIN PENROSE 18X1/2 LTX STRL (DRAIN) ×2 IMPLANT
DRAPE ARM DVNC X/XI (DISPOSABLE) ×8 IMPLANT
DRAPE COLUMN DVNC XI (DISPOSABLE) ×2 IMPLANT
DRAPE DA VINCI XI ARM (DISPOSABLE) ×8
DRAPE DA VINCI XI COLUMN (DISPOSABLE) ×2
DRAPE SHEET LG 3/4 BI-LAMINATE (DRAPES) ×2 IMPLANT
ELECT CAUTERY BLADE 6.4 (BLADE) ×4 IMPLANT
ELECT REM PT RETURN 15FT ADLT (MISCELLANEOUS) ×4 IMPLANT
GLOVE BIO SURGEON STRL SZ 6.5 (GLOVE) ×6 IMPLANT
GLOVE BIO SURGEONS STRL SZ 6.5 (GLOVE) ×2
GLOVE BIOGEL M STRL SZ7.5 (GLOVE) ×12 IMPLANT
GOWN STRL REUS W/TWL LRG LVL3 (GOWN DISPOSABLE) ×20 IMPLANT
GOWN STRL REUS W/TWL XL LVL3 (GOWN DISPOSABLE) ×4 IMPLANT
IRRIG SUCT STRYKERFLOW 2 WTIP (MISCELLANEOUS) ×4
IRRIGATION SUCT STRKRFLW 2 WTP (MISCELLANEOUS) ×2 IMPLANT
KIT PROCEDURE DA VINCI SI (MISCELLANEOUS)
KIT PROCEDURE DVNC SI (MISCELLANEOUS) IMPLANT
LOOP VESSEL MAXI BLUE (MISCELLANEOUS) ×4 IMPLANT
LOOP VESSEL MINI RED (MISCELLANEOUS) IMPLANT
MANIFOLD NEPTUNE II (INSTRUMENTS) ×4 IMPLANT
NDL INSUFFLATION 14GA 120MM (NEEDLE) ×2 IMPLANT
NEEDLE INSUFFLATION 14GA 120MM (NEEDLE) ×4 IMPLANT
PACK CYSTO (CUSTOM PROCEDURE TRAY) ×4 IMPLANT
PACK ROBOT UROLOGY CUSTOM (CUSTOM PROCEDURE TRAY) ×4 IMPLANT
PAD POSITIONING PINK XL (MISCELLANEOUS) ×4 IMPLANT
PORT ACCESS TROCAR AIRSEAL 12 (TROCAR) ×2 IMPLANT
PORT ACCESS TROCAR AIRSEAL 5M (TROCAR) ×2
RELOAD STAPLE 60 2.6 WHT THN (STAPLE) ×14 IMPLANT
RELOAD STAPLE 60 4.1 GRN THCK (STAPLE) ×10 IMPLANT
RELOAD STAPLER GREEN 60MM (STAPLE) ×10 IMPLANT
RELOAD STAPLER WHITE 60MM (STAPLE) ×14 IMPLANT
RETRACTOR LONRSTAR 16.6X16.6CM (MISCELLANEOUS) IMPLANT
RETRACTOR STAY HOOK 5MM (MISCELLANEOUS) ×2 IMPLANT
RETRACTOR STER APS 16.6X16.6CM (MISCELLANEOUS) ×4
RETRACTOR WND ALEXIS 18 MED (MISCELLANEOUS) ×2 IMPLANT
RTRCTR WOUND ALEXIS 18CM MED (MISCELLANEOUS) ×4
SEAL CANN UNIV 5-8 DVNC XI (MISCELLANEOUS) ×8 IMPLANT
SEAL XI 5MM-8MM UNIVERSAL (MISCELLANEOUS) ×8
SOLUTION ELECTROLUBE (MISCELLANEOUS) ×4 IMPLANT
SPONGE LAP 18X18 X RAY DECT (DISPOSABLE) ×8 IMPLANT
SPONGE LAP 4X18 X RAY DECT (DISPOSABLE) ×4 IMPLANT
STAPLER ECHELON LONG 60 440 (INSTRUMENTS) ×4 IMPLANT
STAPLER RELOAD GREEN 60MM (STAPLE) ×20
STAPLER RELOAD WHITE 60MM (STAPLE) ×28
STENT SET URETHERAL LEFT 7FR (STENTS) ×4 IMPLANT
STENT SET URETHERAL RIGHT 7FR (STENTS) ×4 IMPLANT
SURGIFLO W/THROMBIN 8M KIT (HEMOSTASIS) IMPLANT
SUT CHROMIC 4 0 RB 1X27 (SUTURE) ×4 IMPLANT
SUT ETHILON 3 0 PS 1 (SUTURE) ×4 IMPLANT
SUT MNCRL AB 4-0 PS2 18 (SUTURE) ×10 IMPLANT
SUT PDS AB 0 CTX 36 PDP370T (SUTURE) ×12 IMPLANT
SUT SILK 3 0 SH 30 (SUTURE) IMPLANT
SUT SILK 3 0 SH CR/8 (SUTURE) ×6 IMPLANT
SUT VIC AB 2-0 SH 18 (SUTURE) ×2 IMPLANT
SUT VIC AB 2-0 UR5 27 (SUTURE) ×16 IMPLANT
SUT VIC AB 3-0 SH 27 (SUTURE) ×20
SUT VIC AB 3-0 SH 27X BRD (SUTURE) ×2 IMPLANT
SUT VIC AB 3-0 SH 27XBRD (SUTURE) ×2 IMPLANT
SUT VIC AB 4-0 RB1 27 (SUTURE) ×28
SUT VIC AB 4-0 RB1 27XBRD (SUTURE) ×8 IMPLANT
SUT VLOC BARB 180 ABS3/0GR12 (SUTURE) ×4
SUTURE VLOC BRB 180 ABS3/0GR12 (SUTURE) ×2 IMPLANT
SYR CONTROL 10ML LL (SYRINGE) IMPLANT
SYSTEM UROSTOMY GENTLE TOUCH (WOUND CARE) ×4 IMPLANT
TOWEL OR NON WOVEN STRL DISP B (DISPOSABLE) ×4 IMPLANT
TROCAR BLADELESS 15MM (ENDOMECHANICALS) ×4 IMPLANT
TUBING CONNECTING 10 (TUBING) IMPLANT
TUBING CONNECTING 10' (TUBING)
VESSEL LOOPS MINI RED (MISCELLANEOUS)
WATER STERILE IRR 1000ML POUR (IV SOLUTION) ×6 IMPLANT
WATER STERILE IRR 3000ML UROMA (IV SOLUTION) ×4 IMPLANT
YANKAUER SUCT BULB TIP 10FT TU (MISCELLANEOUS) ×2 IMPLANT

## 2017-08-11 NOTE — Interval H&P Note (Signed)
History and Physical Interval Note:  08/11/2017 7:14 AM  Daniel Vega  has presented today for surgery, with the diagnosis of BLADDER CANCER  The various methods of treatment have been discussed with the patient and family. After consideration of risks, benefits and other options for treatment, the patient has consented to  Procedure(s): ROBOT ASSISTED LAPAROSCOPIC RADICAL CYSTOPROSTATECTOMY BILATERAL PELVIC [place] (Bilateral) CYSTOSCOPY WITH INJECTION OF INDOCYANINE GREEN DYE (N/A) as a surgical intervention .  The patient's history has been reviewed, patient examined, no change in status, stable for surgery.  I have reviewed the patient's chart and labs.  Questions were answered to the patient's satisfaction.     [name]

## 2017-08-11 NOTE — Anesthesia Procedure Notes (Addendum)
Procedure Name: Intubation Date/Time: 08/11/2017 7:42 AM Performed by: Anne Fu Pre-anesthesia Checklist: Patient identified, Emergency Drugs available, Suction available, Patient being monitored and Timeout performed Patient Re-evaluated:Patient Re-evaluated prior to induction Oxygen Delivery Method: Circle system utilized Preoxygenation: Pre-oxygenation with 100% oxygen Induction Type: IV induction Ventilation: Mask ventilation without difficulty Laryngoscope Size: Mac and 4 Grade View: Grade I Tube type: Oral Tube size: 7.5 mm Number of attempts: 1 Airway Equipment and Method: Stylet Placement Confirmation: ETT inserted through vocal cords under direct vision,  positive ETCO2 and breath sounds checked- equal and bilateral Secured at: 21 cm Tube secured with: Tape Dental Injury: Teeth and Oropharynx as per pre-operative assessment

## 2017-08-11 NOTE — Brief Op Note (Signed)
08/10/2017 - 08/11/2017  1:34 PM  PATIENT:  Daniel Vega  76 y.o. male  PRE-OPERATIVE DIAGNOSIS:  BLADDER CANCER  POST-OPERATIVE DIAGNOSIS:  BLADDER CANCER  PROCEDURE:  Procedure(s): ROBOT ASSISTED LAPAROSCOPIC RADICAL CYSTOPROSTATECTOMY BILATERAL PELVIC LYMPHADENECTOMY,ILEAL CONDUIT DIVERSION (Bilateral) CYSTOSCOPY WITH INJECTION OF INDOCYANINE GREEN DYE (N/A) and Total urethrectomy  SURGEON:  Surgeon(s) and Role:    Alexis Frock, MD - Primary  PHYSICIAN ASSISTANT:   ASSISTANTS:  Debbrah Alar PA   ANESTHESIA:   local and general  EBL:  200 mL   BLOOD ADMINISTERED:none  DRAINS: 1 - RLQ Urostomy to gravity, 2 - JP to bulb   LOCAL MEDICATIONS USED:  MARCAINE     SPECIMEN:  Source of Specimen:  1 - pelvic lymph nodes; 2 - distal ureteral margins; 3 - bladder + prostate + urethra (en bloc)  DISPOSITION OF SPECIMEN:  PATHOLOGY  COUNTS:  YES  TOURNIQUET:  * No tourniquets in log *  DICTATION: .Other Dictation: Dictation Number [id number]  PLAN OF CARE: Admit to inpatient   PATIENT DISPOSITION:  PACU - hemodynamically stable.   Delay start of Pharmacological VTE agent (>24hrs) due to surgical blood loss or risk of bleeding: yes

## 2017-08-11 NOTE — Anesthesia Procedure Notes (Signed)
Arterial Line Insertion Start/End10/26/2018 7:45 AM, 08/11/2017 7:53 AM Performed by: Catalina Gravel, anesthesiologist  Patient location: Pre-op. Preanesthetic checklist: patient identified, IV checked, site marked, risks and benefits discussed, surgical consent, monitors and equipment checked, pre-op evaluation, timeout performed and anesthesia consent Lidocaine 1% used for infiltration Right, radial was placed Catheter size: 20 Fr Hand hygiene performed  and maximum sterile barriers used   Attempts: 1 Following insertion, dressing applied and Biopatch. Post procedure assessment: normal and unchanged  Patient tolerated the procedure well with no immediate complications.

## 2017-08-11 NOTE — Progress Notes (Signed)
Day of Surgery   Subjective/Chief Complaint:   1 - Stage 2-4 Bladder Cancer - high grade bladder cancer with bilateral hydro involving bladder neck smooth muscle by TURBT 05/2017 on eval new hydro and hematuria. CT 04/2017 w/o locally advanced or distant disease. Had bilateral JJ stents placed 05/2017. Had opinion form Dr. Alen Blew who is concerned about possible chemoresistance given h/o brachytherapy and rec'd up front cystecotmy for this and due to his impressive local sympomts as he is stent and catheter dependant.   Restaging CT 06/2017 without additional disease burden.   Admitted 10/25 for bowel prep, labs, stomal marking.   2 - Prostate Cancer - s/p brachytherapy years ago. PSA 2018 <0.015.   3 - Acute Renal Failure / Bilateral Ureteral Obstruction - Cr 2's 04/2017 up from baseline 1.1 with bilateral hydro from malignant obstruction as per above. Now stented with most recent Cr <1.5.   4 - Lower Urinary Tract Symptoms - on finasteride x years with good control of obstructive LUTS.   Today " Al " is ready for cystectomy. Labs yesterday reassuring with normal GFR and Hgb >10. He complete bowel prep to clear and had stomal marking.   Objective: Vital signs in last 24 hours: Temp:  [97.9 F (36.6 C)-98.8 F (37.1 C)] 98.8 F (37.1 C) (10/26 0455) Pulse Rate:  [73-101] 73 (10/26 0455) Resp:  [18-20] 18 (10/26 0455) BP: (130-179)/(67-87) 158/69 (10/26 0455) SpO2:  [97 %-100 %] 98 % (10/26 0455) Weight:  [78.7 kg (173 lb 8 oz)] 78.7 kg (173 lb 8 oz) (10/25 2307) Last BM Date: 08/10/17  Intake/Output from previous day: 10/25 0701 - 10/26 0700 In: 120 [P.O.:120] Out: 2250 [Urine:2250] Intake/Output this shift: Total I/O In: 120 [P.O.:120] Out: 1550 [Urine:1550]  Physical Exam  Constitutional: He appears well-developed. Wife and family at bedside.  HENT:  Head: Normocephalic.  Eyes: Pupils are equal, round, and reactive to light.  Neck: Normal range of motion.  Cardiovascular:  Normal rate.   Respiratory: Effort normal.  GI: Stable mild truncal obesity.  RLQ Urostomy marking site noted.  Genitourinary:  No CVAT. Foley in place with yellow urine at present.   Neurological: He is alert.  Skin: Skin is warm.  Psychiatric: He has a normal mood and affect. His behavior is normal.    Lab Results:   Recent Labs  08/10/17 1148  WBC 6.7  HGB 10.4*  HCT 31.2*  PLT 249   BMET  Recent Labs  08/10/17 1148  NA 138  K 3.8  CL 101  CO2 26  GLUCOSE 121*  BUN 10  CREATININE 0.83  CALCIUM 9.4   PT/INR No results for input(s): LABPROT, INR in the last 72 hours. ABG No results for input(s): PHART, HCO3 in the last 72 hours.  Invalid input(s): PCO2, PO2  Studies/Results: No results found.  Anti-infectives: Anti-infectives    Start     Dose/Rate Route Frequency Ordered Stop   08/11/17 0600  piperacillin-tazobactam (ZOSYN) IVPB 3.375 g     3.375 g 100 mL/hr over 30 Minutes Intravenous 30 min pre-op 08/10/17 1025     08/10/17 1400  neomycin (MYCIFRADIN) tablet 1,000 mg     1,000 mg Oral Every 8 hours 08/10/17 1029 08/11/17 1359   08/10/17 1200  metroNIDAZOLE (FLAGYL) tablet 500 mg     500 mg Oral Every 6 hours 08/10/17 1029 08/11/17 0007      Assessment/Plan:  Proceed as planned with cystoprostatecotmy + cysto / ICG injection + pelvic lymphadenectomy +  total urethrectomy + ileal conduit urinary diversion. Risks, benefits, alternatives, expected peri-op course discussed extensively previoulsy and reiterated today.   [facility], Dafney Farler 08/11/2017

## 2017-08-11 NOTE — H&P (View-Only) (Signed)
Day of Surgery   Subjective/Chief Complaint:   1 - Stage 2-4 Bladder Cancer - high grade bladder cancer with bilateral hydro involving bladder neck smooth muscle by TURBT 05/2017 on eval new hydro and hematuria. CT 04/2017 w/o locally advanced or distant disease. Had bilateral JJ stents placed 05/2017. Had opinion form Dr. Alen Blew who is concerned about possible chemoresistance given h/o brachytherapy and rec'd up front cystecotmy for this and due to his impressive local sympomts as he is stent and catheter dependant.   Restaging CT 06/2017 without additional disease burden.   Admitted 10/25 for bowel prep, labs, stomal marking.   2 - Prostate Cancer - s/p brachytherapy years ago. PSA 2018 <0.015.   3 - Acute Renal Failure / Bilateral Ureteral Obstruction - Cr 2's 04/2017 up from baseline 1.1 with bilateral hydro from malignant obstruction as per above. Now stented with most recent Cr <1.5.   4 - Lower Urinary Tract Symptoms - on finasteride x years with good control of obstructive LUTS.   Today " Daniel Vega " is ready for cystectomy. Labs yesterday reassuring with normal GFR and Hgb >10. He complete bowel prep to clear and had stomal marking.   Objective: Vital signs in last 24 hours: Temp:  [97.9 F (36.6 C)-98.8 F (37.1 C)] 98.8 F (37.1 C) (10/26 0455) Pulse Rate:  [73-101] 73 (10/26 0455) Resp:  [18-20] 18 (10/26 0455) BP: (130-179)/(67-87) 158/69 (10/26 0455) SpO2:  [97 %-100 %] 98 % (10/26 0455) Weight:  [78.7 kg (173 lb 8 oz)] 78.7 kg (173 lb 8 oz) (10/25 2307) Last BM Date: 08/10/17  Intake/Output from previous day: 10/25 0701 - 10/26 0700 In: 120 [P.O.:120] Out: 2250 [Urine:2250] Intake/Output this shift: Total I/O In: 120 [P.O.:120] Out: 1550 [Urine:1550]  Physical Exam  Constitutional: He appears well-developed. Wife and family at bedside.  HENT:  Head: Normocephalic.  Eyes: Pupils are equal, round, and reactive to light.  Neck: Normal range of motion.  Cardiovascular:  Normal rate.   Respiratory: Effort normal.  GI: Stable mild truncal obesity.  RLQ Urostomy marking site noted.  Genitourinary:  No CVAT. Foley in place with yellow urine at present.   Neurological: He is alert.  Skin: Skin is warm.  Psychiatric: He has a normal mood and affect. His behavior is normal.    Lab Results:   Recent Labs  08/10/17 1148  WBC 6.7  HGB 10.4*  HCT 31.2*  PLT 249   BMET  Recent Labs  08/10/17 1148  NA 138  K 3.8  CL 101  CO2 26  GLUCOSE 121*  BUN 10  CREATININE 0.83  CALCIUM 9.4   PT/INR No results for input(s): LABPROT, INR in the last 72 hours. ABG No results for input(s): PHART, HCO3 in the last 72 hours.  Invalid input(s): PCO2, PO2  Studies/Results: No results found.  Anti-infectives: Anti-infectives    Start     Dose/Rate Route Frequency Ordered Stop   08/11/17 0600  piperacillin-tazobactam (ZOSYN) IVPB 3.375 g     3.375 g 100 mL/hr over 30 Minutes Intravenous 30 min pre-op 08/10/17 1025     08/10/17 1400  neomycin (MYCIFRADIN) tablet 1,000 mg     1,000 mg Oral Every 8 hours 08/10/17 1029 08/11/17 1359   08/10/17 1200  metroNIDAZOLE (FLAGYL) tablet 500 mg     500 mg Oral Every 6 hours 08/10/17 1029 08/11/17 0007      Assessment/Plan:  Proceed as planned with cystoprostatecotmy + cysto / ICG injection + pelvic lymphadenectomy +  total urethrectomy + ileal conduit urinary diversion. Risks, benefits, alternatives, expected peri-op course discussed extensively previoulsy and reiterated today.   [facility], Torry Adamczak 08/11/2017

## 2017-08-11 NOTE — Transfer of Care (Signed)
Immediate Anesthesia Transfer of Care Note  Patient: Daniel Vega  Procedure(s) Performed: ROBOT ASSISTED LAPAROSCOPIC RADICAL CYSTOPROSTATECTOMY BILATERAL PELVIC LYMPHADENECTOMY,ILEAL CONDUIT DIVERSION (Bilateral ) CYSTOSCOPY WITH INJECTION OF INDOCYANINE GREEN DYE (N/A )  Patient Location: PACU  Anesthesia Type:General  Level of Consciousness: awake and oriented  Airway & Oxygen Therapy: Patient Spontanous Breathing and Patient connected to face mask oxygen  Post-op Assessment: Report given to RN and Post -op Vital signs reviewed and stable  Post vital signs: Reviewed and stable  Last Vitals:  Vitals:   08/10/17 2147 08/11/17 0455  BP: 130/67 (!) 158/69  Pulse: 98 73  Resp: 18 18  Temp: 37 C 37.1 C  SpO2: 100% 98%    Last Pain:  Vitals:   08/11/17 0655  TempSrc:   PainSc: 8       Patients Stated Pain Goal: 3 (01/74/94 4967)  Complications: No apparent anesthesia complications

## 2017-08-11 NOTE — Anesthesia Postprocedure Evaluation (Signed)
Anesthesia Post Note  Patient: Daniel Vega  Procedure(s) Performed: ROBOT ASSISTED LAPAROSCOPIC RADICAL CYSTOPROSTATECTOMY BILATERAL PELVIC LYMPHADENECTOMY,ILEAL CONDUIT DIVERSION (Bilateral ) CYSTOSCOPY WITH INJECTION OF INDOCYANINE GREEN DYE (N/A )     Patient location during evaluation: PACU Anesthesia Type: General Level of consciousness: awake and alert Pain management: pain level controlled Vital Signs Assessment: post-procedure vital signs reviewed and stable Respiratory status: spontaneous breathing, nonlabored ventilation and respiratory function stable Cardiovascular status: blood pressure returned to baseline and stable Postop Assessment: no apparent nausea or vomiting Anesthetic complications: no    Last Vitals:  Vitals:   08/11/17 1600 08/11/17 1618  BP: (!) 90/58 (!) 98/52  Pulse: 74 78  Resp: 10 12  Temp: 36.7 C (!) 36.4 C  SpO2: 98% 95%    Last Pain:  Vitals:   08/11/17 1530  TempSrc:   PainSc: 0-No pain                 Catalina Gravel

## 2017-08-12 LAB — CBC
HCT: 23.9 % — ABNORMAL LOW (ref 39.0–52.0)
Hemoglobin: 8 g/dL — ABNORMAL LOW (ref 13.0–17.0)
MCH: 32.5 pg (ref 26.0–34.0)
MCHC: 33.5 g/dL (ref 30.0–36.0)
MCV: 97.2 fL (ref 78.0–100.0)
Platelets: 194 10*3/uL (ref 150–400)
RBC: 2.46 MIL/uL — ABNORMAL LOW (ref 4.22–5.81)
RDW: 13.9 % (ref 11.5–15.5)
WBC: 17.4 10*3/uL — ABNORMAL HIGH (ref 4.0–10.5)

## 2017-08-12 LAB — BASIC METABOLIC PANEL
ANION GAP: 8 (ref 5–15)
BUN: 19 mg/dL (ref 6–20)
CALCIUM: 7.8 mg/dL — AB (ref 8.9–10.3)
CHLORIDE: 102 mmol/L (ref 101–111)
CO2: 22 mmol/L (ref 22–32)
Creatinine, Ser: 1.29 mg/dL — ABNORMAL HIGH (ref 0.61–1.24)
GFR calc non Af Amer: 53 mL/min — ABNORMAL LOW (ref >60)
Glucose, Bld: 171 mg/dL — ABNORMAL HIGH (ref 65–99)
POTASSIUM: 4.4 mmol/L (ref 3.5–5.1)
SODIUM: 132 mmol/L — AB (ref 135–145)

## 2017-08-12 MED ORDER — ACETAMINOPHEN 325 MG PO TABS: 650.0000 mg | ORAL_TABLET | Freq: Four times a day (QID) | ORAL | Status: DC | PRN

## 2017-08-12 MED ORDER — HEPARIN SODIUM (PORCINE) 5000 UNIT/ML IJ SOLN: 5000.0000 [IU] | Freq: Three times a day (TID) | INTRAMUSCULAR | Status: DC

## 2017-08-12 MED ORDER — HYDROMORPHONE HCL 1 MG/ML IJ SOLN
0.5000 mg | INTRAMUSCULAR | Status: DC | PRN
  Administered 2017-08-12 – 2017-08-13 (×6): 0.5 mg via INTRAVENOUS
  Filled 2017-08-12 (×6): qty 1

## 2017-08-12 NOTE — Progress Notes (Signed)
Will hold heparin until repeat H/H tomorrow AM.  Ellison Hughs, MD

## 2017-08-12 NOTE — Progress Notes (Signed)
1 Day Post-Op Subjective: No acute events overnight.  Pain controlled.  Denies nauea/vomiting.  No flatus or BM yet.    Objective: Vital signs in last 24 hours: Temp:  [97.5 F (36.4 C)-98.8 F (37.1 C)] 98 F (36.7 C) (10/27 0800) Pulse Rate:  [67-87] 87 (10/27 0700) Resp:  [8-23] 13 (10/27 0800) BP: (83-123)/(50-72) 106/51 (10/26 2005) SpO2:  [95 %-100 %] 100 % (10/27 0800) Arterial Line BP: (81-123)/(40-57) 103/52 (10/27 0700)  Intake/Output from previous day: 10/26 0701 - 10/27 0700 In: 4172.9 [I.V.:4022.9; IV Piggyback:150] Out: 755 [Urine:300; Drains:255; Blood:200]  Intake/Output this shift: No intake/output data recorded.  Physical Exam:  General: Alert and oriented CV: RRR, palpable distal pulses Lungs: CTAB, equal chest rise Abdomen: Soft, NTND, no rebound or guarding, urostomy is viable and draining clear-yellow urine with stent in place Incisions: c/d/i.  JP with serosanguineous output  Ext: NT, No erythema  Lab Results:  Recent Labs  08/10/17 1148  HGB 10.4*  HCT 31.2*   BMET  Recent Labs  08/10/17 1148  NA 138  K 3.8  CL 101  CO2 26  GLUCOSE 121*  BUN 10  CREATININE 0.83  CALCIUM 9.4     Studies/Results: No results found.  Assessment/Plan: POD s/p robotic cystectomy with ileal conduit urinary diversion  -AM labs pending. -Start heparin for DVTp if H/H stable.  OOBTC and ambulate with assistance today -Decrease IVF to 100 mL/hr.  Advance diet to ice chips today -Encouraged incentive spirometry and wean [place] as tolerated -d/c PCA.  Continue PRN tramadol, tylenol and IV dilaudid -Transfer to floor today -Continue supportive care   LOS: 2 days   Ellison Hughs, MD 08/12/2017, 8:49 AM  [facility] Pager: [phone]

## 2017-08-13 LAB — BASIC METABOLIC PANEL
Anion gap: 9 (ref 5–15)
BUN: 17 mg/dL (ref 6–20)
CALCIUM: 8.1 mg/dL — AB (ref 8.9–10.3)
CO2: 26 mmol/L (ref 22–32)
CREATININE: 0.99 mg/dL (ref 0.61–1.24)
Chloride: 102 mmol/L (ref 101–111)
Glucose, Bld: 143 mg/dL — ABNORMAL HIGH (ref 65–99)
Potassium: 3.8 mmol/L (ref 3.5–5.1)
SODIUM: 137 mmol/L (ref 135–145)

## 2017-08-13 LAB — CBC
HCT: 23.7 % — ABNORMAL LOW (ref 39.0–52.0)
Hemoglobin: 7.9 g/dL — ABNORMAL LOW (ref 13.0–17.0)
MCH: 32.5 pg (ref 26.0–34.0)
MCHC: 33.3 g/dL (ref 30.0–36.0)
MCV: 97.5 fL (ref 78.0–100.0)
Platelets: 191 10*3/uL (ref 150–400)
RBC: 2.43 MIL/uL — AB (ref 4.22–5.81)
RDW: 13.9 % (ref 11.5–15.5)
WBC: 16.6 10*3/uL — AB (ref 4.0–10.5)

## 2017-08-13 MED ORDER — TRAMADOL HCL 50 MG PO TABS
50.0000 mg | ORAL_TABLET | Freq: Four times a day (QID) | ORAL | Status: DC | PRN
  Administered 2017-08-13 – 2017-08-14 (×3): 50 mg via ORAL
  Filled 2017-08-13 (×3): qty 1

## 2017-08-13 MED ORDER — HEPARIN SODIUM (PORCINE) 5000 UNIT/ML IJ SOLN
5000.0000 [IU] | Freq: Three times a day (TID) | INTRAMUSCULAR | Status: DC
  Administered 2017-08-13 – 2017-08-22 (×26): 5000 [IU] via SUBCUTANEOUS
  Filled 2017-08-13 (×26): qty 1

## 2017-08-13 MED ORDER — ONDANSETRON HCL 4 MG/2ML IJ SOLN
4.0000 mg | Freq: Four times a day (QID) | INTRAMUSCULAR | Status: DC | PRN
  Administered 2017-08-15 – 2017-08-20 (×7): 4 mg via INTRAVENOUS
  Filled 2017-08-13 (×8): qty 2

## 2017-08-13 MED ORDER — HYDROMORPHONE HCL 1 MG/ML IJ SOLN
1.0000 mg | INTRAMUSCULAR | Status: DC | PRN
  Administered 2017-08-13 – 2017-08-19 (×16): 1 mg via INTRAVENOUS
  Filled 2017-08-13 (×16): qty 1

## 2017-08-13 NOTE — Progress Notes (Signed)
2 Days Post-Op Subjective: Pain in his perineum yesterday after ambulating a short distance and sitting in the chair.  Not receiving PO oxy to supplement IV dilaudid.  He reports intermittent nausea and a bloated feeling in his abdomen.  Denies vomiting, passing flatus or having a BM.    Objective: Vital signs in last 24 hours: Temp:  [97.7 F (36.5 C)-98.1 F (36.7 C)] 97.7 F (36.5 C) (10/28 0420) Pulse Rate:  [71-106] 106 (10/28 0420) Resp:  [10-18] 18 (10/28 0420) BP: (116-151)/(62-76) 151/76 (10/28 0420) SpO2:  [90 %-100 %] 96 % (10/28 0420) Weight:  [84.1 kg (185 lb 6.5 oz)] 84.1 kg (185 lb 6.5 oz) (10/27 1128)  Intake/Output from previous day: 10/27 0701 - 10/28 0700 In: 4899.3 [I.V.:4789.3; IV Piggyback:50] Out: 2485 [Urine:2305; Drains:180]  Intake/Output this shift: No intake/output data recorded.  Physical Exam:  General: Alert and oriented.  Uncomfortable. CV: RRR, palpable distal pulses Lungs: CTAB, equal chest rise Abdomen: Mildy distended with some guarding in the upper quadrants.  No rebound tenderness.  Urostomy is viable and draining yellow urine with stents in place.  JP with serosanguineous output Incisions: c/d/i Ext: NT, No erythema  Lab Results:  Recent Labs  08/10/17 1148 08/12/17 0828 08/13/17 0418  HGB 10.4* 8.0* 7.9*  HCT 31.2* 23.9* 23.7*   BMET  Recent Labs  08/12/17 0828 08/13/17 0418  NA 132* 137  K 4.4 3.8  CL 102 102  CO2 22 26  GLUCOSE 171* 143*  BUN 19 17  CREATININE 1.29* 0.99  CALCIUM 7.8* 8.1*     Studies/Results: No results found.  Assessment/Plan: POD 2 s/p robotic cystectomy with ileal conduit urinary diversion and urethrectomy  -Will increase Dilaudid dose to 1 mg q 2 hours PRN.  I spoke with the nurse about making sure that the patient is receiving PO pain medications in addition to IV. -Advance diet to clears.  PRN Zofran ordered. -H/H stable.  Will start Heparin for DVTp -Renal function improving.  Will  taper IVF to 75 mL/hr -Encouraged IC and ambulation if he can tolerate it -Will continue to monitor.   LOS: 3 days   Ellison Hughs, MD 08/13/2017, 10:16 AM  [facility] Pager: [phone]

## 2017-08-13 NOTE — Progress Notes (Signed)
Pts Blood pressure is 151/76. Paged the MD. Parameters are to page if >140

## 2017-08-14 LAB — CBC
HCT: 23.5 % — ABNORMAL LOW (ref 39.0–52.0)
HEMOGLOBIN: 7.7 g/dL — AB (ref 13.0–17.0)
MCH: 32.1 pg (ref 26.0–34.0)
MCHC: 32.8 g/dL (ref 30.0–36.0)
MCV: 97.9 fL (ref 78.0–100.0)
PLATELETS: 188 10*3/uL (ref 150–400)
RBC: 2.4 MIL/uL — AB (ref 4.22–5.81)
RDW: 14 % (ref 11.5–15.5)
WBC: 13.3 10*3/uL — ABNORMAL HIGH (ref 4.0–10.5)

## 2017-08-14 LAB — BASIC METABOLIC PANEL
ANION GAP: 4 — AB (ref 5–15)
BUN: 15 mg/dL (ref 6–20)
CALCIUM: 8 mg/dL — AB (ref 8.9–10.3)
CO2: 28 mmol/L (ref 22–32)
CREATININE: 0.85 mg/dL (ref 0.61–1.24)
Chloride: 105 mmol/L (ref 101–111)
GFR calc non Af Amer: >60 mL/min (ref >60)
Glucose, Bld: 135 mg/dL — ABNORMAL HIGH (ref 65–99)
Potassium: 4 mmol/L (ref 3.5–5.1)
SODIUM: 137 mmol/L (ref 135–145)

## 2017-08-14 LAB — BPAM RBC
BLOOD PRODUCT EXPIRATION DATE: 201811122359
BLOOD PRODUCT EXPIRATION DATE: 201811122359
UNIT TYPE AND RH: 6200
UNIT TYPE AND RH: 6200

## 2017-08-14 LAB — TYPE AND SCREEN
ABO/RH(D): A POS
Antibody Screen: NEGATIVE
Unit division: 0
Unit division: 0

## 2017-08-14 MED ORDER — ACETAMINOPHEN 500 MG PO TABS
1000.0000 mg | ORAL_TABLET | Freq: Three times a day (TID) | ORAL | Status: DC
  Administered 2017-08-14 (×2): 1000 mg via ORAL
  Filled 2017-08-14 (×2): qty 2

## 2017-08-14 MED ORDER — ACETAMINOPHEN 500 MG PO TABS
1000.0000 mg | ORAL_TABLET | Freq: Three times a day (TID) | ORAL | Status: DC
  Administered 2017-08-14 – 2017-08-18 (×11): 1000 mg via ORAL
  Filled 2017-08-14 (×11): qty 2

## 2017-08-14 MED ORDER — OXYCODONE HCL 5 MG PO TABS
10.0000 mg | ORAL_TABLET | Freq: Four times a day (QID) | ORAL | Status: DC | PRN
  Administered 2017-08-14 – 2017-08-15 (×4): 10 mg via ORAL
  Filled 2017-08-14 (×5): qty 2

## 2017-08-14 NOTE — Op Note (Signed)
NAME:  USMAN, MILLETT NO.:  MEDICAL RECORD NO.:  [id number]  LOCATION:                                 FACILITY:  PHYSICIAN:  Alexis Frock, MD     DATE OF BIRTH:  12/12/1940  DATE OF PROCEDURE: 08/11/2017                              OPERATIVE REPORT   DIAGNOSIS:  Stage IV bladder cancer.  PROCEDURES: 1. Cystoscopy with indocyanine green injection. 2. Robotic-assisted laparoscopic radical cystoprostatectomy with total     urethrectomy and bilateral pelvic lymphadenectomy and ileal conduit     urinary diversion.  ASSISTANT:  Debbrah Alar, PA.  ESTIMATED BLOOD LOSS:  200 mL.  COMPLICATION:  None.  SPECIMEN: 1. Right external iliac lymph nodes. 2. Right obturator lymph nodes. 3. Right internal iliac lymph nodes. 4. Right common iliac lymph nodes. 5. Left common iliac lymph nodes. 6. Left external iliac lymph nodes. 7. Left internal iliac lymph nodes. 8. Left obturator lymph nodes. 9. Left distal ureteral margin. 10.Right distal ureteral margin. 11.Bladder, prostate, plus urethra en bloc. 12.Final right distal ureteral margin. 13.Final left distal ureteral margin.  DRAINS: 1. Jackson-Pratt drain to bulb suction. 2. Right lower quadrant urostomy to gravity drainage with Bander     stents in situ, right is red, left is blue.  FINDINGS: 1. No obvious sentinel lymph nodes with ICG lymphangiography, likely     due to prior radiation ablation lymphatic channels. 2. Very adherent bladder neck and prostate consistent with prior known     radiation. 3. Somewhat redundant sigmoid colon.  INDICATION:  Mr. Prouty is a 76 year old gentleman, who was found moderate irritative voiding and hematuria to have stage IV bladder cancer with high-grade papillary urothelial carcinoma invading prostatic stroma.  He notably has history of prostate cancer, status post combination external beam and brachytherapy, 8 years ago and has good biochemical control of  this.  He has severe irritative voiding and pelvic pain.  Staging CT was unremarkable.  He was referred for consideration of neoadjuvant chemotherapy, but given his impressive symptoms and history of radiation implying possible chemoresistant, it was felt upfront surgery will be most advantageous and he wished to proceed.  Informed consent was obtained and placed in the medical record.  Notably, he was admitted preoperatively 1 day for bowel prep, stomal marking, and labs.  Most recent restaging CT clinically localized.  PROCEDURE IN DETAIL:  The patient being, Briton Sellman, was verified. Procedure being, cystoprostatectomy with total urethrectomy, ileal conduit and diversion, were confirmed.  Procedure was carried out.  Time- out was performed.  Intravenous antibiotics were administered.  General endotracheal anesthesia was introduced.  The patient placed into a low lithotomy position after tucking his arms at his side.  He has further fashioned to the operating table using 3-inch tape over foam padding across his supraxiphoid chest.  Sterile field was created by clipper shaving and prepping and draping his infra-xiphoid abdomen using chlorhexidine gluconate and his penis, perineum, proximal thighs using iodine.  His in situ Foley catheter had been removed and a new one placed per urethra to straight drain.  A high-flow, low-pressure pneumoperitoneum was obtained using Veress technique in the supraumbilical  midline having passed the aspiration and drop test.  An 8-mm robotic camera port was placed in this location. Laparoscopic examination of the peritoneal cavity revealed no significant adhesions and no visceral injury.  He had a very redundant sigmoid colon.  Additional ports were placed as follows:  Right paramedian 8 mm robotic port, right far lateral 12 mm assistant port, right paramedian 15 mm assistant port at the site of the previous marked stoma, left paramedian 8-mm robotic  port, left far lateral 8-mm robotic port.  Just prior to robot docking, cystourethroscopy was performed, and a 24-French injection scope rigid set.  Inspection of the anterior and posterior urethra revealed some friable papillary changes in the prostatic urethra consistent with known tumor.  There were some papillary changes of the bladder neck.  Bilateral distal stones were in situ.  The area of the bladder neck and urethra were injected with 3 mL of indocyanine green dye in separate aliquots supervised sentinel lymphangiography, and a new Foley catheter was placed per urethra to straight drain.  After robot docking, attention was directed at identification of the left ureter and from the left retroperitoneum, incision was made lateral to the descending colon from the area of the iliac superiorly present approximately 8 cm and distally coursing lateral to the left medial umbilical ligament towards the area in the anterior abdominal wall.  The vas deferens was encountered and purposely ligated using medial bucket-handle and the lateral bladder wall was swept away from the pelvis towards the area of the endopelvic fascia. The left ureter was encountered as it coursed over the iliac vessels. Anticipatedly, there was marked vessel loop.  There was a stent in situ. It was mobilized distally circumferentially to the area of the ureterovesical junction.  It was doubly clipped and ligated with the proximal clipping a tagged dyed suture.  The in situ stent was purposely cut distal end remaining in the specimen, the proximal end being retrieved, setting aside for discard.  The area was then mobilized proximally for distance approximately 6 cm above the area of the iliac crossing taking exquisite care to avoid defatting or vascularization of the ureter.  Left-sided pelvic lymphadenectomy was then performed after the pelvis was inspected under near-infrared fluorescence.  Sentinel lymphangiography  revealed no obvious lymphatic channels coursing from the area of the bladder and tumor to the area of the pelvic lymph nodes.  This felt to likely represent prior ablation to lymphatics from radiation.  Standard template lymphadenectomy was then performed first of the left common iliac lymph nodes with confines being aortic bifurcation, iliac bifurcation.  Lymphostasis was achieved with cold clips, set aside, labeled as left common iliac lymph nodes.  Left external iliac group was mobilized with confines being left external iliac artery, vein, pelvic side wall, iliac bifurcation. Lymphostasis was achieved with cold clips, set aside, labeled as left external iliac lymph nodes.  Left obturator group was mobilized with confines being left external iliac vein, pelvic sidewall, obturator nerve.  Lymphostasis was achieved with cold clips, set aside, labeled as left obturator lymph nodes.  The obturator nerve was inspected following these maneuvers and found to be uninjured.  Fibrofatty tissue overlying the area of the internal iliac artery from an iliac bifurcation towards the area of the superior vesical artery was mobilized.  Lymphostasis was achieved with cold clips, set aside, labeled as left internal iliac lymph nodes.  After having freed up lymph nodes and ureter on the left hemipelvis, attention directed at the right side.  Incision was made lateral to the ascending colon and cecum towards the area of the iliac vessels and coursing laterally to the right medial umbilical ligament towards the anterior abdominal wall. The right vas deferens was encountered and purposely ligated.  The right lateral bladder wall was away from the pelvis toward the area of the endopelvic fascia.  The right ureter was encountered as it coursed over the iliac vessels.  It was quite adherent in this location.  This was very carefully mobilized off the iliac vessels.  This marked vessel loop dissected  distally to the area of the ureterovesical junction, which was doubly clipped and ligated with the in situ stent having been completely removed robotically set aside for discard.  The proximal end was tagged with a white colored stitch.  Frozen section did have some atypia at the distal most aspect periureterally and this likely correspond to some significant inflammation and possible even tumor visibly.  Decision was made not to send additional frozen sections, but to just take the ureter as high as possible later attempting urinary diversion.  Ureter was then mobilized superiorly for distance approximately 4 cm above the iliac crossing.  Template lymphadenectomy was performed on the right side with right external iliac, right obturator, right internal iliac, and right common iliac lymph nodes as per the left.  With this dissection, it inherently created a suitable posterior peritoneal window just above the area of the aortic bifurcation.  Through which, the right ureter was brought into the left hemipelvis such that the right and left ureter were in apposition above the level of the iliacs.  Attention was directed at posterior bladder dissection.  Posterior dissection was performed by creating posterior peritoneal flap between the 2 previous lateral posterior peritoneal incisions and developing the plane posterior to the bladder and posterior to bilateral vas deferens and seminal vesicles toward the area of the prostate and marching towards the prostate, the planes became more adherent as anticipated giving history of prior radiation; however, planes were still discernible in a plane posterior to prostate, but the superior to rectum was dissected toward the area of the apex of the prostate.  The endopelvic fascia was then carefully swept away from the lateral aspect of the prostate in a base-to-apex orientation on both sides.  This was expectedly adherent given prior radiation.  This  exposed the vascular pedicles of the bladder and prostate.  These were controlled using white load vascular stapler x2 each side taking exquisite care to avoid rectal injury, which did not occur.  Visibly anterior attachments were taken down using cautery scissors, exposed the anterior base of the prostate near the presumed dorsal venous complex, which was controlled using vascular stapler. Final apical dissection was performed in the anterior plane towards the area of the membranous urethra and pelvic floor.  Concurrently, an approach of total urethrectomy was being performed through a linear incision on the perineum approximately 4 cm in length.  Lone Star retractor was deployed.  Dissection was carried down through subcutaneous perforated tissue towards the area of the bulbar urethra. Bulbospongiosus muscle was purposely incised in the midline and urethra was circumferentially mobilized, marked with a Penrose drain, and then dissected distally as far as possible to the presumed area of the fossa navicularis, the penis by inverting the penis via the perineal incision. The Foley catheter was anchored to the urethra with through and through silk sutures and then cut distally at the area of the fossa navicularis via the perineal incision.  The penis was already everted.  Dissection proceeded proximally towards the area of the membranous urethra and in the anterior plane just behind the pubic bone continuity between the pelvic dissection and perineal dissection was encountered purposely and then using combination of peritoneal dissection and robotic dissection, the posterior plane was carefully developed taking exquisite care to avoid rectal injury, which did not occur and this completely freed up the en bloc specimen.  During dissection, the area of the membranous urethra was distracted somewhat, but kept en bloc over the Foley catheter.  The entire specimen was brought back into the  abdominal cavity, placed into extra large EndoCatch bag for later retrieval.  The perineal defect was closed in 4 separate layers using running 3-0 Vicryl and the final level of skin using subcuticular Monocryl.  From the abdominal robotic approach again, the area of the ileocecal junction was identified and a segment of terminal ileum approximately 15 cm proximal to the ileocecal valve was noted and appeared to be a suitable pedicle and vascularity for conduit formation.  This was marked with a tagged silk suture with a distal clip placed for proximal and distal orientation.  Closed suction drain was then brought to the previous left lateral most robotic port site near the peritoneal cavity.  The specimen string was brought through the left medial most robotic port site.  The tagged bilateral ureter stitches and distal bowel sutures were grasped with a locking grasper via the future conduit site.  Robot was then undocked.  Specimen was retrieved by extending the previous camera port site inferiorly for distance approximately 6 cm airing on the left side of the umbilicus removing the bladder, prostate, urethra specimen en bloc, setting aside for permanent pathology.  A wound protector was then deployed in the right and left distal ureter and distal ileum were brought into the operative field.  The ureters appeared to be of suitable length in the bowel of stable mobilization and vascularity for conduit formation.  As such, a segment of 15 cm of distal ileum was brought out of continuity with the distal end corresponding to the previously marked area using bowel load stapler and mesentery was further mobilized using 1 fire white load of the stapler proximally and distally taking exquisite care to avoid devascularization of the anastomotic or conduit segments, and the conduit segment was lined to an extraperitoneal orientation, and bowel-bowel anastomosis was performed using 2 fires of the  green load stapler, which resulted in excellent palpable bowel reanastomosis.  The free end was oversewn using running silk with a second imbricating layer of running silk.  The acute angle was bolstered with interrupted silk and mesenteric defect was closed with interrupted silk x3.  The anastomotic segment appeared to be significantly vascular and palpably patent and was replaced into the abdominal cavity.  The proximal staple line of the conduit was closed using running Vicryl and distal staple line was removed.  A 4 mm segment of the proximal end of the conduit close to the mesenteric side was excised using Potts scissors and 4 mucosal everting sutures of interrupted Vicryl were placed on each side for the ureteral anastomoses, and attention was directed to the left ureteral anastomosis.  The left ureter was spatulated and the final margin set aside for permanent pathology especially for distance approximately 1 cm.  Heel stitch of interrupted 4-0 Vicryl was applied.  A blue-colored Bander stent was placed 25 cm to the anastomosis, and ureteral anastomosis was then completed using two  separate running suture lines of running 4-0 Vicryl, which resulted in excellent ureteroileal anastomosis and mucosal apposition.  Similarly, the right ureter was anastomosed to the contralateral site.  An additional 2 cm of length of the mesentery felt to be safe for conduit formation was excised and set aside.  Has final right distal ureteral margin given somewhat atypia on frozen section.  An urethral anastomosis was performed on the right side as per the left, but with a red-colored Bander stent 25 cm to the anastomosis.  Attention was directed at conduit formation.  A segment approximately 1.5 cm in diameter of skin and subcutaneous fiber tissue was excised from the previously marked stomal site.  The area of fascia was then dilated to accommodate two surgeon's fingers, and the distal conduit was  brought through this with the stents and 4 anchoring sutures of Vicryl were applied to prevent peristomal hernia formation.  This was inspected via the extraction site, and the mesentery was in proper alignment as was the ureters.  Rose budding was performed using 4 separate Vicryl suture sites and conduit to skin anastomosis was further performed using interrupted Vicryl x3 between each rose budding suture.  The extraction site was closed at the level of the fascia using figure-of-eight PDS x6.  All incision sites were infiltrated with dilute lyophilized Marcaine.  The extraction site was further closed to the level of Scarpa's using running Vicryl, and all incision sites were closed to the level of skin using subcuticular Monocryl followed by Dermabond.  The ostomy appliance was applied. Procedure was terminated.  The patient tolerated procedure well.  There were no immediate periprocedural complications.  The patient was taken to the postanesthesia care in stable condition with plan for step-down admission.          ______________________________ Alexis Frock, MD     TM/MEDQ  D:  08/11/2017  T:  08/11/2017  Job:  [id number]

## 2017-08-14 NOTE — Evaluation (Signed)
Physical Therapy Evaluation Patient Details Name: Daniel Vega MRN: [id number] DOB: 1941/06/04 Today's Date: 08/14/2017   History of Present Illness  Pt admitted with Bladder-Prostate Cancer - s/p robotic cystoprostatectomy + total urethrectomy + pelvic lymphadenectomy and ileal conduit diversion on 08/11/17  Clinical Impression  Pt admitted with above diagnosis. Pt currently with functional limitations due to the deficits listed below (see PT Problem List).  Pt will benefit from skilled PT to increase their independence and safety with mobility to allow discharge to the venue listed below.  Pt hesitant to mobilize however agreeable and reports feeling better with ambulation.  Pt encouraged to ambulate with staff.  Pt plans to d/c home "when ready."     Follow Up Recommendations Home health PT;Supervision/Assistance - 24 hour    Equipment Recommendations  None recommended by PT    Recommendations for Other Services       Precautions / Restrictions Precautions Precautions: Fall Precaution Comments: L JP drain, urostomy R UQ      Mobility  Bed Mobility Overal bed mobility: Needs Assistance Bed Mobility: Supine to Sit     Supine to sit: Min assist;HOB elevated     General bed mobility comments: assist for trunk upright  Transfers Overall transfer level: Needs assistance Equipment used: Rolling walker (2 wheeled) Transfers: Sit to/from Stand Sit to Stand: Min assist         General transfer comment: verbal cues for hand placement, assist to rise and steady  Ambulation/Gait Ambulation/Gait assistance: Min guard Ambulation Distance (Feet): 120 Feet Assistive device: Rolling walker (2 wheeled) Gait Pattern/deviations: Step-through pattern;Decreased stride length;Trunk flexed     General Gait Details: verbal cues for RW positioning and posture, distance to tolerance  Stairs            Wheelchair Mobility    Modified Rankin (Stroke Patients Only)        Balance Overall balance assessment: Needs assistance         Standing balance support: Bilateral upper extremity supported Standing balance-Leahy Scale: Poor Standing balance comment: requiring UE support                             Pertinent Vitals/Pain Pain Assessment: 0-10 Pain Score: 5  Pain Location: pressure in peritoneum  Pain Descriptors / Indicators: Pressure Pain Intervention(s): Limited activity within patient's tolerance;Repositioned;Monitored during session    Home Living Family/patient expects to be discharged to:: Private residence Living Arrangements: Spouse/significant other Available Help at Discharge: Family Type of Home: Mobile home Home Access: Stairs to enter Entrance Stairs-Rails: Right Entrance Stairs-Number of Steps: 2 Home Layout: One level Home Equipment: Walker - 2 wheels;Cane - single point      Prior Function Level of Independence: Independent               Hand Dominance        Extremity/Trunk Assessment        Lower Extremity Assessment Lower Extremity Assessment: Generalized weakness       Communication   Communication: No difficulties  Cognition Arousal/Alertness: Awake/alert Behavior During Therapy: WFL for tasks assessed/performed Overall Cognitive Status: Within Functional Limits for tasks assessed                                        General Comments      Exercises     Assessment/Plan  PT Assessment Patient needs continued PT services  PT Problem List Decreased strength;Decreased mobility;Decreased activity tolerance;Pain;Decreased knowledge of use of DME       PT Treatment Interventions Gait training;DME instruction;Therapeutic activities;Therapeutic exercise;Functional mobility training;Patient/family education;Balance training;Stair training    PT Goals (Current goals can be found in the Care Plan section)  Acute Rehab PT Goals PT Goal Formulation: With  patient Time For Goal Achievement: 08/21/17 Potential to Achieve Goals: Good    Frequency Min 3X/week   Barriers to discharge        Co-evaluation               AM-PAC PT "6 Clicks" Daily Activity  Outcome Measure Difficulty turning over in bed (including adjusting bedclothes, sheets and blankets)?: A Little Difficulty moving from lying on back to sitting on the side of the bed? : Unable Difficulty sitting down on and standing up from a chair with arms (e.g., wheelchair, bedside commode, etc,.)?: Unable Help needed moving to and from a bed to chair (including a wheelchair)?: A Little Help needed walking in hospital room?: A Little Help needed climbing 3-5 steps with a railing? : A Little 6 Click Score: 14    End of Session Equipment Utilized During Treatment: Gait belt Activity Tolerance: Patient tolerated treatment well Patient left: in chair;with call bell/phone within reach;with family/visitor present Nurse Communication: Mobility status PT Visit Diagnosis: Difficulty in walking, not elsewhere classified (R26.2)    Time: 5859-2924 PT Time Calculation (min) (ACUTE ONLY): 28 min   Charges:   PT Evaluation $PT Eval Low Complexity: 1 Low     PT G CodesCarmelia Bake, PT, DPT 08/14/2017 Pager: [phone]  York Ram E 08/14/2017, 1:11 PM

## 2017-08-14 NOTE — Care Management Note (Signed)
Case Management Note  Patient Details  Name: Daniel Vega MRN: [id number] Date of Birth: 1941-02-26  Subjective/Objective:    76 yo admitted with bladder cancer.                 Action/Plan: From home with wife. Pt offered choice for home health services for new ostomy and physical therapy. [facility] chosen and referral called to [facility] rep. Will need MD orders for HHRN/PT.  Expected Discharge Date:   (unknown)               Expected Discharge Plan:     In-House Referral:     Discharge planning Services     Post Acute Care Choice:    Choice offered to:     DME Arranged:    DME Agency:     HH Arranged:    HH Agency:     Status of Service:     If discussed at [employer], dates discussed:    Additional CommentsLynnell Catalan, RN 08/14/2017, 1:34 PM [phone]

## 2017-08-14 NOTE — Consult Note (Addendum)
[facility] Nurse ostomy consult note Stoma type/location: RUQ ileal conduit with two stents intact.  Red = right, blue = left Stomal assessment/size: 1 and 3/8 inches round, raised, red and moist.  Edematous.  Os near center Peristomal assessment: intact, clear Treatment options for stomal/peristomal skin: skin barrier ring Output: gold urine Ostomy pouching: 1pc. Convex urostomy pouching system with skin barrier ring  Education provided: Extended session with patient to demonstrate pouching system removal, stoma measuring, pouch preparation, pouch application.  Patient able to perform opening and closing of drain spout.  Demonstrated use of bedside urinary drainage bag connector.  Patient did not ambulate this weekend; is focused on getting OOB and ambulating today.  Supplies provided to bedside.  Patient taught the purpose of the post discharge support and sampling program and signs the consent for sample products to be mailed to his home today.  I will wait until tomorrow to register for this program when I see how pouching system holds up overnight. I will see tomorrow am. Patient will require support of an [facility] for continued education and resizing of ostomy pouching system as stoma changes size and shape. Enrolled patient in [place] program: No  [facility] nursing team will follow, and will remain available to this patient, the nursing and medical teams.   Thanks, Maudie Flakes, MSN, RN, [place], Arther Abbott  Pager# [phone]

## 2017-08-14 NOTE — Progress Notes (Signed)
3 Days Post-Op   Subjective/Chief Complaint:  1 - Bladder-Prostate Cancer - s/p robotic cystoprostatectomy + total urethrectomy + pelvic lymphadenectomy and ileal conduit diversion on 08/11/17. Path pending. Stepdown POD0 and transferred to med-surg flor 10/27.  2 - Return Of Bowel Function  - s/p ileal ansatamosis at time of cystectomy / conduit. NPO initilaly post-op, advnaced to clears 10/28. He was on narcotics pre-op and therefore not entered candidate, he has not been compliant with ambulation  3 - Anemia - Pre-op Hgb's 10s, post-op high 7-8 and stable.  4 - Disposition / Rehab - independent at baseline. Ostomy RN helping with new ostomy teaching in house. PT eval pending.   Today "Daniel Vega" is stable. Has not ambulated at all since surgery. Tollerating clears w/o emesis. No flatus yet.    Objective: Vital signs in last 24 hours: Temp:  [98 F (36.7 C)-98.4 F (36.9 C)] 98.4 F (36.9 C) (10/28 2242) Pulse Rate:  [75-108] 75 (10/28 2242) Resp:  [18] 18 (10/28 2242) BP: (123-148)/(77-97) 123/77 (10/28 2242) SpO2:  [97 %-100 %] 100 % (10/28 2242) Last BM Date: 08/09/17  Intake/Output from previous day: 10/28 0701 - 10/29 0700 In: 2880 [P.O.:480; I.V.:2400] Out: 1770 [Urine:1500; Drains:270] Intake/Output this shift: No intake/output data recorded.  General appearance: alert, cooperative, appears stated age and family at bedside Eyes: negative Nose: Nares normal. Septum midline. Mucosa normal. No drainage or sinus tenderness. Throat: lips, mucosa, and tongue normal; teeth and gums normal Neck: supple, symmetrical, trachea midline Back: symmetric, no curvature. ROM normal. No CVA tenderness. Resp: non-labored on minimal [place] O2 Cardio: Nl rate GI: soft, non-tender; bowel sounds normal; no masses,  no organomegaly Male genitalia: normal, mild penoscrotal edema.  Extremities: extremities normal, atraumatic, no cyanosis or edema and SCD's in place Pulses: 2+ and symmetric Skin:  Skin color, texture, turgor normal. No rashes or lesions Lymph nodes: Cervical, supraclavicular, and axillary nodes normal. Neurologic: Grossly normal Incision/Wound: RLQ Urostomy pink / patent with bander stents and clear yellow urine. JP with scant serous drainage that is non-foul. Perineal incision c/d/i.   Lab Results:   Recent Labs  08/13/17 0418 08/14/17 0417  WBC 16.6* 13.3*  HGB 7.9* 7.7*  HCT 23.7* 23.5*  PLT 191 188   BMET  Recent Labs  08/13/17 0418 08/14/17 0417  NA 137 137  K 3.8 4.0  CL 102 105  CO2 26 28  GLUCOSE 143* 135*  BUN 17 15  CREATININE 0.99 0.85  CALCIUM 8.1* 8.0*   PT/INR No results for input(s): LABPROT, INR in the last 72 hours. ABG No results for input(s): PHART, HCO3 in the last 72 hours.  Invalid input(s): PCO2, PO2  Studies/Results: No results found.  Anti-infectives: Anti-infectives    Start     Dose/Rate Route Frequency Ordered Stop   08/11/17 1700  piperacillin-tazobactam (ZOSYN) IVPB 2.25 g     2.25 g 100 mL/hr over 30 Minutes Intravenous Every 8 hours 08/11/17 1627 08/12/17 2305   08/11/17 0647  piperacillin-tazobactam (ZOSYN) 3.375 GM/50ML IVPB    Comments:  Harle Stanford   : cabinet override      08/11/17 0647 08/11/17 0810   08/11/17 0600  piperacillin-tazobactam (ZOSYN) IVPB 3.375 g     3.375 g 100 mL/hr over 30 Minutes Intravenous 30 min pre-op 08/10/17 1025 08/11/17 0810   08/10/17 1400  neomycin (MYCIFRADIN) tablet 1,000 mg     1,000 mg Oral Every 8 hours 08/10/17 1029 08/11/17 1359   08/10/17 1200  metroNIDAZOLE (FLAGYL) tablet  500 mg     500 mg Oral Every 6 hours 08/10/17 1029 08/11/17 0007      Assessment/Plan:  1 - Bladder-Prostate Cancer - no further cancer directed care in house. Path pending. Continue current drains.  2 - Return Of Bowel Function  - STRONGLY encouraged ambulation with laps in hall at least 3X per day to help with this and prevent DVT/PE and help expeted MSK soreness.   PT will  hopefuly help motivate him as well.  Continue clears until BM or flatus.   3 - Anemia - consider transfustion if Hgb further falls or orthostasis.   4 - Disposition / Rehab - remain in house. Will need HHRN at least and possibley PT pending their eval at time of discharge, likely mid week.   [facility], Masato Pettie 08/14/2017

## 2017-08-14 NOTE — Progress Notes (Signed)
Nutrition Brief Note  Patient identified on the Malnutrition Screening Tool (MST) Report  Pt with fluctuating weights since 2015. Pt's weight now consistent with weights from 2015. Insignificant weight loss noted since 7/30. Pt has been ordered Boost Breeze supplements.  Wt Readings from Last 15 Encounters:  08/12/17 185 lb 6.5 oz (84.1 kg)  06/27/17 194 lb 9.6 oz (88.3 kg)  05/24/17 188 lb 8 oz (85.5 kg)  05/15/17 196 lb (88.9 kg)  07/15/16 200 lb (90.7 kg)  12/10/15 203 lb (92.1 kg)  12/08/15 203 lb (92.1 kg)  04/30/15 206 lb 12.8 oz (93.8 kg)  08/13/14 186 lb 15.9 oz (84.8 kg)  08/12/14 187 lb (84.8 kg)  07/26/14 187 lb 1.6 oz (84.9 kg)  07/14/14 191 lb 12.8 oz (87 kg)  10/25/13 197 lb 1.6 oz (89.4 kg)  01/24/13 188 lb 14.4 oz (85.7 kg)  08/07/12 177 lb 8 oz (80.5 kg)    Body mass index is 29.93 kg/m. Patient meets criteria for overweight based on current BMI.   Current diet order is CLD, patient is consuming approximately 100% of meals at this time. Labs and medications reviewed.   No nutrition interventions warranted at this time. If nutrition issues arise, please consult RD.   Clayton Bibles, MS, RD, [place] Dietitian Pager: [phone] After Hours Pager: [phone]

## 2017-08-14 NOTE — Care Management Important Message (Signed)
Important Message  Patient Details  Name: Daniel Vega MRN: [id number] Date of Birth: [birth year]   Medicare Important Message Given:  Yes    Kerin Salen 08/14/2017, 10:48 AMImportant Message  Patient Details  Name: Daniel Vega MRN: [id number] Date of Birth: July 06, 1941   Medicare Important Message Given:  Yes    Kerin Salen 08/14/2017, 10:48 AM

## 2017-08-15 LAB — BASIC METABOLIC PANEL
Anion gap: 8 (ref 5–15)
BUN: 12 mg/dL (ref 6–20)
CO2: 27 mmol/L (ref 22–32)
CREATININE: 0.77 mg/dL (ref 0.61–1.24)
Calcium: 8.2 mg/dL — ABNORMAL LOW (ref 8.9–10.3)
Chloride: 100 mmol/L — ABNORMAL LOW (ref 101–111)
GFR calc Af Amer: >60 mL/min (ref >60)
GLUCOSE: 120 mg/dL — AB (ref 65–99)
POTASSIUM: 3.8 mmol/L (ref 3.5–5.1)
Sodium: 135 mmol/L (ref 135–145)

## 2017-08-15 LAB — CBC
HEMATOCRIT: 22.9 % — AB (ref 39.0–52.0)
Hemoglobin: 7.6 g/dL — ABNORMAL LOW (ref 13.0–17.0)
MCH: 32.1 pg (ref 26.0–34.0)
MCHC: 33.2 g/dL (ref 30.0–36.0)
MCV: 96.6 fL (ref 78.0–100.0)
Platelets: 222 10*3/uL (ref 150–400)
RBC: 2.37 MIL/uL — ABNORMAL LOW (ref 4.22–5.81)
RDW: 14.1 % (ref 11.5–15.5)
WBC: 11.3 10*3/uL — ABNORMAL HIGH (ref 4.0–10.5)

## 2017-08-15 NOTE — Progress Notes (Signed)
4 Days Post-Op   Subjective/Chief Complaint:  1 - Bladder-Prostate Cancer - s/p robotic cystoprostatectomy + total urethrectomy + pelvic lymphadenectomy and ileal conduit diversion on 08/11/17. Path pending. Stepdown POD0 and transferred to med-surg flor 10/27.  2 - Return Of Bowel Function  - s/p ileal ansatamosis at time of cystectomy / conduit. NPO initilaly post-op, advnaced to clears 10/28. He was on narcotics pre-op and therefore not entereg candidate.  3 - Anemia - Pre-op Hgb's 10s, post-op high 7-8 and stable.  4 - Disposition / Rehab - independent at baseline. Ostomy RN helping with new ostomy teaching in house. PT eval pending.   Today "Daniel Vega" is improving. Walkled in hall x several yesterday and out of bed most of day. Still no flatus /BM. Tollerating clears.    Objective: Vital signs in last 24 hours: Temp:  [97.8 F (36.6 C)-99.4 F (37.4 C)] 99.4 F (37.4 C) (10/30 0514) Pulse Rate:  [70-88] 88 (10/30 0514) Resp:  [16-20] 20 (10/30 0514) BP: (103-155)/(57-77) 155/77 (10/30 0514) SpO2:  [98 %-100 %] 98 % (10/30 0514) Last BM Date: 08/10/17  Intake/Output from previous day: 10/29 0701 - 10/30 0700 In: 806.7 [P.O.:120; I.V.:686.7] Out: 1120 [Urine:850; Drains:270] Intake/Output this shift: Total I/O In: -  Out: 90 [Drains:90]   General appearance: alert, cooperative, appears stated age and family at bedside Eyes: negative Nose: Nares normal. Septum midline. Mucosa normal. No drainage or sinus tenderness. Throat: lips, mucosa, and tongue normal; teeth and gums normal Neck: supple, symmetrical, trachea midline Back: symmetric, no curvature. ROM normal. No CVA tenderness. Resp: non-labored on room air Cardio: Nl rate GI: soft, non-tender; bowel sounds normal; no masses,  no organomegaly Male genitalia: normal, mild penoscrotal edema.  Extremities: extremities normal, atraumatic, no cyanosis or edema and SCD's in place Pulses: 2+ and symmetric Skin: Skin color,  texture, turgor normal. No rashes or lesions Lymph nodes: Cervical, supraclavicular, and axillary nodes normal. Neurologic: Grossly normal Incision/Wound: RLQ Urostomy pink / patent with bander stents and clear yellow urine. JP with scant serous drainage that is non-foul. Perineal incision c/d/i.   Lab Results:   Recent Labs  08/14/17 0417 08/15/17 0401  WBC 13.3* 11.3*  HGB 7.7* 7.6*  HCT 23.5* 22.9*  PLT 188 222   BMET  Recent Labs  08/14/17 0417 08/15/17 0401  NA 137 135  K 4.0 3.8  CL 105 100*  CO2 28 27  GLUCOSE 135* 120*  BUN 15 12  CREATININE 0.85 0.77  CALCIUM 8.0* 8.2*   PT/INR No results for input(s): LABPROT, INR in the last 72 hours. ABG No results for input(s): PHART, HCO3 in the last 72 hours.  Invalid input(s): PCO2, PO2  Studies/Results: No results found.  Anti-infectives: Anti-infectives    Start     Dose/Rate Route Frequency Ordered Stop   08/11/17 1700  piperacillin-tazobactam (ZOSYN) IVPB 2.25 g     2.25 g 100 mL/hr over 30 Minutes Intravenous Every 8 hours 08/11/17 1627 08/12/17 2305   08/11/17 0647  piperacillin-tazobactam (ZOSYN) 3.375 GM/50ML IVPB    Comments:  Harle Stanford   : cabinet override      08/11/17 0647 08/11/17 0810   08/11/17 0600  piperacillin-tazobactam (ZOSYN) IVPB 3.375 g     3.375 g 100 mL/hr over 30 Minutes Intravenous 30 min pre-op 08/10/17 1025 08/11/17 0810   08/10/17 1400  neomycin (MYCIFRADIN) tablet 1,000 mg     1,000 mg Oral Every 8 hours 08/10/17 1029 08/11/17 1359   08/10/17 1200  metroNIDAZOLE (  FLAGYL) tablet 500 mg     500 mg Oral Every 6 hours 08/10/17 1029 08/11/17 0007      Assessment/Plan:  1 - Bladder-Prostate Cancer - no further cancer directed care in house. Path pending. Continue current drains.  2 - Return Of Bowel Function  - STRONGLY encouraged ambulation with laps in hall at least 3X per day to help with this and prevent DVT/PE and help expeted MSK soreness.   PT will hopefuly help  continue to motivate him as well.  Continue clears until BM or flatus.   3 - Anemia - consider transfustion if Hgb further falls significantly or orthostasis.   4 - Disposition / Rehab - remain in house. Will need HHRN at least and possibly PT pending their eval at time of discharge, likely mid week.   [facility], Kelcie Currie 08/15/2017

## 2017-08-15 NOTE — Consult Note (Signed)
[facility] Nurse ostomy follow up Stoma type/location: RUQ ileal conduit with stents intact. Pouch applied yesterday is intact Stomal assessment/size: 1 and 3/8 inches Peristomal assessment: not seen today Treatment options for stomal/peristomal skin: skin barrier ring Output Light yellow urine Ostomy pouching: 1pc. Convex pouching system with skin barrier ring Education provided: Patient is independent in attaching and disconnecting beside drainage bag from pouch and is ambulating in hallway. Will see tomorrow. Enrolled patient in [place] Discharge program: Yes, today.  4 pouches, 4 rings, belt and power ordered. [facility] nursing team will follow, and will remain available to this patient, the nursing and medical teams.   Thanks, Maudie Flakes, MSN, RN, [place], Arther Abbott  Pager# [phone]

## 2017-08-15 NOTE — Progress Notes (Signed)
Physical Therapy Treatment Patient Details Name: Daniel Vega MRN: [id number] DOB: Jul 05, 1941 Today's Date: 08/15/2017    History of Present Illness Pt admitted with Bladder-Prostate Cancer - s/p robotic cystoprostatectomy + total urethrectomy + pelvic lymphadenectomy and ileal conduit diversion on 08/11/17    PT Comments    Pt reports a "bad night" last night with increased pain and poor sleep.  Assisted OOB to amd an increased distance.  See mobility details below.    Follow Up Recommendations  Home health PT;Supervision/Assistance - 24 hour     Equipment Recommendations  None recommended by PT    Recommendations for Other Services       Precautions / Restrictions Precautions Precautions: Fall Precaution Comments: L JP drain, urostomy R UQ Restrictions Weight Bearing Restrictions: No    Mobility  Bed Mobility Overal bed mobility: Needs Assistance Bed Mobility: Supine to Sit     Supine to sit: Mod assist;Max assist     General bed mobility comments: increased assist due to increased c/o ABD pain  Transfers Overall transfer level: Needs assistance Equipment used: Rolling walker (2 wheeled) Transfers: Sit to/from Stand Sit to Stand: Min guard;Min assist         General transfer comment: 50% VC's on proper hand placement and required elevated bed due to increased c/o ABD pain  Ambulation/Gait Ambulation/Gait assistance: Supervision;Min guard Ambulation Distance (Feet): 175 Feet Assistive device: Rolling walker (2 wheeled) Gait Pattern/deviations: Step-through pattern;Decreased stride length;Trunk flexed Gait velocity: WFL   General Gait Details: slightly forward flex posture due to ABD pain, decreased but functional gait speed.  25% VC's safety with turns using walker.     Stairs            Wheelchair Mobility    Modified Rankin (Stroke Patients Only)       Balance                                            Cognition  Arousal/Alertness: Awake/alert Behavior During Therapy: WFL for tasks assessed/performed Overall Cognitive Status: Within Functional Limits for tasks assessed                                        Exercises      General Comments        Pertinent Vitals/Pain Pain Location: 8/10 ABD pain with bed mobility and sitting EOB,  4/10 ABD pain with amb Pain Descriptors / Indicators: Grimacing Pain Intervention(s): Monitored during session    Home Living                      Prior Function            PT Goals (current goals can now be found in the care plan section) Progress towards PT goals: Progressing toward goals    Frequency    Min 3X/week      PT Plan Current plan remains appropriate    Co-evaluation              AM-PAC PT "6 Clicks" Daily Activity  Outcome Measure    Difficulty moving from lying on back to sitting on the side of the bed? : Unable Difficulty sitting down on and standing up from a chair with arms (e.g., wheelchair, bedside commode, etc,.)?:  Unable Help needed moving to and from a bed to chair (including a wheelchair)?: A Little Help needed walking in hospital room?: A Little Help needed climbing 3-5 steps with a railing? : A Little 6 Click Score: 11    End of Session Equipment Utilized During Treatment: Gait belt Activity Tolerance: Patient tolerated treatment well Patient left: in chair;with call bell/phone within reach;with family/visitor present Nurse Communication: Mobility status PT Visit Diagnosis: Difficulty in walking, not elsewhere classified (R26.2)     Time: 1015-1040 PT Time Calculation (min) (ACUTE ONLY): 25 min  Charges:  $Gait Training: 8-22 mins $Therapeutic Activity: 8-22 mins                    G Codes:       Rica Koyanagi  PTA [facility]  Acute  Rehab Pager      [phone]

## 2017-08-16 LAB — CBC
HCT: 25.8 % — ABNORMAL LOW (ref 39.0–52.0)
HEMOGLOBIN: 8.7 g/dL — AB (ref 13.0–17.0)
MCH: 32.2 pg (ref 26.0–34.0)
MCHC: 33.7 g/dL (ref 30.0–36.0)
MCV: 95.6 fL (ref 78.0–100.0)
PLATELETS: 283 10*3/uL (ref 150–400)
RBC: 2.7 MIL/uL — AB (ref 4.22–5.81)
RDW: 14 % (ref 11.5–15.5)
WBC: 15.3 10*3/uL — ABNORMAL HIGH (ref 4.0–10.5)

## 2017-08-16 LAB — BASIC METABOLIC PANEL
ANION GAP: 11 (ref 5–15)
BUN: 13 mg/dL (ref 6–20)
CALCIUM: 8.5 mg/dL — AB (ref 8.9–10.3)
CO2: 25 mmol/L (ref 22–32)
CREATININE: 0.81 mg/dL (ref 0.61–1.24)
Chloride: 98 mmol/L — ABNORMAL LOW (ref 101–111)
GLUCOSE: 153 mg/dL — AB (ref 65–99)
Potassium: 4.2 mmol/L (ref 3.5–5.1)
Sodium: 134 mmol/L — ABNORMAL LOW (ref 135–145)

## 2017-08-16 MED ORDER — BISACODYL 10 MG RE SUPP
10.0000 mg | Freq: Once | RECTAL | Status: AC
  Administered 2017-08-16: 10 mg via RECTAL
  Filled 2017-08-16: qty 1

## 2017-08-16 NOTE — Progress Notes (Signed)
5 Days Post-Op   Subjective/Chief Complaint:  1 - Bladder-Prostate Cancer - s/p robotic cystoprostatectomy + total urethrectomy + pelvic lymphadenectomy and ileal conduit diversion on 08/11/17. Path pending. Stepdown POD0 and transferred to med-surg [facility] 10/27.  2 - Return Of Bowel Function  - s/p ileal ansatamosis at time of cystectomy / conduit. NPO initilaly post-op, advnaced to clears 10/28. He was on narcotics pre-op and therefore not entereg candidate. Some emesis 10/31 but not refractory.   3 - Anemia - Pre-op Hgb's 10s, post-op high 7-8 and stable.  4 - Disposition / Rehab - independent at baseline. Ostomy RN helping with new ostomy teaching in house. PT eval pending.   Today "Al" is stable. He had few episodes emesis yesterday and feels improved after. He is walking more.    Objective: Vital signs in last 24 hours: Temp:  [98.2 F (36.8 C)-98.9 F (37.2 C)] 98.9 F (37.2 C) (10/31 0450) Pulse Rate:  [73-89] 87 (10/31 0450) Resp:  [18-20] 18 (10/31 0450) BP: (125-136)/(64-85) 136/85 (10/31 0450) SpO2:  [97 %-100 %] 97 % (10/31 0450) Last BM Date: 08/15/17  Intake/Output from previous day: 10/30 0701 - 10/31 0700 In: 240 [P.O.:240] Out: 1510 [Urine:350; Emesis/NG output:700; Drains:460] Intake/Output this shift: Total I/O In: -  Out: 26 [Drains:50]  General appearance: alert, cooperative, appears stated age and family at bedside Eyes: negative Nose: Nares normal. Septum midline. Mucosa normal. No drainage or sinus tenderness. Throat: lips, mucosa, and tongue normal; teeth and gums normal Neck: supple, symmetrical, trachea midline Back: symmetric, no curvature. ROM normal. No CVA tenderness. Resp: non-labored on room air Cardio: Nl rate GI: soft, non-tender; bowel sounds normal; no masses, no organomegaly.  Male genitalia: normal, mild penoscrotal edema.  Extremities: extremities normal, atraumatic, no cyanosis or edema and SCD's in place Pulses: 2+ and  symmetric Skin: Skin color, texture, turgor normal. No rashes or lesions Lymph nodes: Cervical, supraclavicular, and axillary nodes normal. Neurologic: Grossly normal Incision/Wound: RLQ Urostomy pink / patent with bander stents and clear yellow urine. JP with scant serous drainage that is non-foul. Perineal incision c/d/i.   Lab Results:   Recent Labs  08/15/17 0401 08/16/17 0350  WBC 11.3* 15.3*  HGB 7.6* 8.7*  HCT 22.9* 25.8*  PLT 222 283   BMET  Recent Labs  08/15/17 0401 08/16/17 0350  NA 135 134*  K 3.8 4.2  CL 100* 98*  CO2 27 25  GLUCOSE 120* 153*  BUN 12 13  CREATININE 0.77 0.81  CALCIUM 8.2* 8.5*   PT/INR No results for input(s): LABPROT, INR in the last 72 hours. ABG No results for input(s): PHART, HCO3 in the last 72 hours.  Invalid input(s): PCO2, PO2  Studies/Results: No results found.  Anti-infectives: Anti-infectives    Start     Dose/Rate Route Frequency Ordered Stop   08/11/17 1700  piperacillin-tazobactam (ZOSYN) IVPB 2.25 g     2.25 g 100 mL/hr over 30 Minutes Intravenous Every 8 hours 08/11/17 1627 08/12/17 2305   08/11/17 0647  piperacillin-tazobactam (ZOSYN) 3.375 GM/50ML IVPB    Comments:  Harle Stanford   : cabinet override      08/11/17 0647 08/11/17 0810   08/11/17 0600  piperacillin-tazobactam (ZOSYN) IVPB 3.375 g     3.375 g 100 mL/hr over 30 Minutes Intravenous 30 min pre-op 08/10/17 1025 08/11/17 0810   08/10/17 1400  neomycin (MYCIFRADIN) tablet 1,000 mg     1,000 mg Oral Every 8 hours 08/10/17 1029 08/11/17 1359   08/10/17 1200  metroNIDAZOLE (FLAGYL) tablet 500 mg     500 mg Oral Every 6 hours 08/10/17 1029 08/11/17 0007      Assessment/Plan:  1 - Bladder-Prostate Cancer - no further cancer directed care in house. Path pending. Continue current drains.  2 - Return Of Bowel Function  - he is now doing better with ambulation. Ducoloax SPP x 1 today. If emesis or nausea become refractory will place NGT for more complete  bowel rest.    Continue clears until BM or flatus.   3 - Anemia - this has stabilized. Observe.   4 - Disposition / Rehab - remain in house. Will need HHRN at least and possibly PT pending their eval at time of discharge, likely later this week.   [facility], Masaye Gatchalian 08/16/2017

## 2017-08-16 NOTE — Progress Notes (Signed)
Physical Therapy Treatment Patient Details Name: Daniel Vega MRN: [id number] DOB: 03-Jan-1941 Today's Date: 08/16/2017    History of Present Illness Pt admitted with Bladder-Prostate Cancer - s/p robotic cystoprostatectomy + total urethrectomy + pelvic lymphadenectomy and ileal conduit diversion on 08/11/17    PT Comments    Family participating in patient's mobility and is assisting to ambulate in halls. Instructed in Self ROM for UE to facilitate abdominal stretching/posture. Continue PT.   Follow Up Recommendations  Home health PT;Supervision/Assistance - 24 hour     Equipment Recommendations  None recommended by PT    Recommendations for Other Services       Precautions / Restrictions Precautions Precautions: Fall Precaution Comments: L JP drain, urostomy R UQ    Mobility  Bed Mobility               General bed mobility comments: in recliner by family  Transfers Overall transfer level: Needs assistance Equipment used: Rolling walker (2 wheeled) Transfers: Sit to/from Stand Sit to Stand: Mod assist         General transfer comment: patient requested family to pull up with 1 UE, encouraged to push from recliner. also did not reach back to armrests to sit down as  encouraged to do so.  Ambulation/Gait Ambulation/Gait assistance: Supervision;Min guard Ambulation Distance (Feet): 175 Feet Assistive device: Rolling walker (2 wheeled) Gait Pattern/deviations: Step-through pattern     General Gait Details: slightly forward flex posture , encouraged to look ahead and staighten, decreased but functional gait speed.     Stairs            Wheelchair Mobility    Modified Rankin (Stroke Patients Only)       Balance                                            Cognition Arousal/Alertness: Awake/alert                                            Exercises General Exercises - Upper Extremity Shoulder Flexion:  AROM;Strengthening;Self ROM;Both;10 reps;Supine  Instructed to perform semi supine and seated upright. 10 reps  Every hour as tolerated.    General Comments        Pertinent Vitals/Pain Pain Assessment: Faces Faces Pain Scale: Hurts whole lot Pain Location: during leaning forward  to prep to stand Pain Descriptors / Indicators: Grimacing;Guarding Pain Intervention(s): Monitored during session    Home Living                      Prior Function            PT Goals (current goals can now be found in the care plan section) Progress towards PT goals: Progressing toward goals    Frequency    Min 3X/week      PT Plan Current plan remains appropriate    Co-evaluation              AM-PAC PT "6 Clicks" Daily Activity  Outcome Measure  Difficulty turning over in bed (including adjusting bedclothes, sheets and blankets)?: A Little Difficulty moving from lying on back to sitting on the side of the bed? : Unable Difficulty sitting down on and standing up from a chair  with arms (e.g., wheelchair, bedside commode, etc,.)?: Unable Help needed moving to and from a bed to chair (including a wheelchair)?: A Little Help needed walking in hospital room?: A Little Help needed climbing 3-5 steps with a railing? : A Lot 6 Click Score: 13    End of Session   Activity Tolerance: Patient tolerated treatment well Patient left: in chair;with call bell/phone within reach;with family/visitor present Nurse Communication: Mobility status PT Visit Diagnosis: Difficulty in walking, not elsewhere classified (R26.2)     Time: 6269-4854 PT Time Calculation (min) (ACUTE ONLY): 13 min  Charges:  $Gait Training: 8-22 mins                    G CodesTresa Endo PT [phone]    Claretha Cooper 08/16/2017, 11:23 AM

## 2017-08-16 NOTE — Consult Note (Signed)
[place] Nurse ostomy follow up Stoma type/location: RUQ ileal conduit with stents intact Stomal assessment/size: per Monday, 1 and 3/8 inches round. Peristomal assessment: Not seentoday Treatment options for stomal/peristomal skin: skin barrier ring Output: yellow to yellow-gold urine, clear  Ostomy pouching: 1pc. Convex urostomy pouch with skin barrier ring  Education provided: Patient is feeling quite tired from vomiting early this morning and from walking with PT. He is resting in room with lights off and daughter in room also resting.  They awaken for short teaching session and conversation with me to remind them that tomorrow we will change pouch and they together decide who will be here from the family to observe that (this daughter does not want to see stoma).  They decide that patient's other daughter will be here tomorrow morning. Patient reminded to disconnect from bedside drainage bag whenever out of bed so that he can begin to recognize the sensation of a pouch filling and when to empty into the toilet. Patient is suddenly very warm and requests that I both turn down the temperature in his room and get him a cold washcloth for his forehead.  I request a bedside fan from the unit secretary in an attempt to keep him more comfortable. Will see tomorrow for pouch change between 9-11am. Enrolled patient in [place] Start Discharge program: Yes [place] nursing team will follow, and will remain available to this patient, the nursing and medical teams.   Thanks, Maudie Flakes, MSN, RN, [place], Arther Abbott  Pager# [phone]

## 2017-08-16 NOTE — Progress Notes (Addendum)
Patient emesis 700cc. Appeared orange in color.Marland Kitchen PRN zofran previously given.

## 2017-08-16 NOTE — Progress Notes (Signed)
Pt ambulated twice today, detaching and reattaching his urostomy bag successfully each time.

## 2017-08-17 LAB — CBC
HEMATOCRIT: 27.8 % — AB (ref 39.0–52.0)
HEMOGLOBIN: 9.2 g/dL — AB (ref 13.0–17.0)
MCH: 31.3 pg (ref 26.0–34.0)
MCHC: 33.1 g/dL (ref 30.0–36.0)
MCV: 94.6 fL (ref 78.0–100.0)
Platelets: 298 10*3/uL (ref 150–400)
RBC: 2.94 MIL/uL — ABNORMAL LOW (ref 4.22–5.81)
RDW: 13.7 % (ref 11.5–15.5)
WBC: 15.5 10*3/uL — AB (ref 4.0–10.5)

## 2017-08-17 LAB — BASIC METABOLIC PANEL
Anion gap: 10 (ref 5–15)
BUN: 14 mg/dL (ref 6–20)
CHLORIDE: 98 mmol/L — AB (ref 101–111)
CO2: 28 mmol/L (ref 22–32)
Calcium: 8.8 mg/dL — ABNORMAL LOW (ref 8.9–10.3)
Creatinine, Ser: 0.77 mg/dL (ref 0.61–1.24)
GFR calc Af Amer: >60 mL/min (ref >60)
GFR calc non Af Amer: >60 mL/min (ref >60)
GLUCOSE: 151 mg/dL — AB (ref 65–99)
Potassium: 3.5 mmol/L (ref 3.5–5.1)
Sodium: 136 mmol/L (ref 135–145)

## 2017-08-17 NOTE — Consult Note (Signed)
[place] Nurse ostomy follow up Stoma type/location: RUQ ileal conduit with stents intact (2).  Red = Right, Blue = Left Stomal assessment/size: 1 and 3/8 inches round, edematous, moist, os slightly off center toward midline, pointing slightly downward. Patient is still nauseated and on clear liquids.  HHRN requested for both both RN and PT. Peristomal assessment: intact in the immediate peristomal plane, two areas of resolving pMARSI at 7 o'clock Treatment options for stomal/peristomal skin: Covered areas of pMARSI with piece of thin film transparent dressing. Output: clear yellow urine Ostomy pouching: 1pc.convex pouching system with skin barrier ring Education provided: Extended session with wife and 3 daughters present.  Pouch removal, pouch preparation, ostomy sizing, stent management, peristomal skin irritation management, purpose of belt, introduction to [email or web address] taught.   Discussion regarding mucus as a normal finding with urostomies and how to use stoma powder was reviewed.  Discussed about purpose of [employer], supplies, etc. Enrolled patient in [place] Start Discharge program: Yes [place] nursing team will not follow, but will remain available to this patient, the nursing and medical teams.  Please re-consult if needed. Thanks, Maudie Flakes, MSN, RN, [place], Arther Abbott  Pager# [phone]

## 2017-08-17 NOTE — Progress Notes (Signed)
PT Cancellation Note  Patient Details Name: ELDON ZIETLOW MRN: [id number] DOB: 07-27-1941   Cancelled Treatment:    Reason Eval/Treat Not Completed: Attempted PT tx session. Pt just back to bed and resting per family. Per family, pt walked x 2 today . Will continue to follow. Will check back another day.    Weston Anna, MPT Pager: [phone]

## 2017-08-17 NOTE — Progress Notes (Signed)
6 Days Post-Op   Subjective/Chief Complaint:  1 - Bladder-Prostate Cancer - s/p robotic cystoprostatectomy + total urethrectomy + pelvic lymphadenectomy and ileal conduit diversion on 08/11/17. Path pending. Stepdown POD0 and transferred to med-surg [facility] 10/27.  2 - Return Of Bowel Function  - s/p ileal ansatamosis at time of cystectomy / conduit. NPO initilaly post-op, advnaced to clears 10/28. He was on narcotics pre-op and therefore not entereg candidate. Emesis yesterday evening but BMx2 this morning.   3 - Anemia - Pre-op Hgb's 10s, post-op high 7-8 and stable. Awaiting labs this am  4 - Disposition / Rehab - independent at baseline. Ostomy RN helping with new ostomy teaching in house. PT evaluated yesterday and rec HH PT; Supervision/Assistance - 24 hour  Mr. Narasimhan walked 3 times yesterday, large volume emesis last evening but had 2x BM this am. Still on clears for comfort   Objective: Vital signs in last 24 hours: Temp:  [98.7 F (37.1 C)-99 F (37.2 C)] 98.8 F (37.1 C) (11/01 0600) Pulse Rate:  [88-94] 94 (11/01 0600) Resp:  [18-20] 20 (11/01 0600) BP: (141-163)/(70-81) 163/81 (11/01 0600) SpO2:  [98 %-99 %] 99 % (11/01 0600) Last BM Date: 08/15/17  Intake/Output from previous day: 10/31 0701 - 11/01 0700 In: 100 [P.O.:100] Out: 2040 [Urine:1075; Emesis/NG output:700; Drains:265] Intake/Output this shift: No intake/output data recorded.  General appearance: alert, cooperative, appears stated age and family at bedside Eyes: negative Nose: Nares normal. Septum midline. Mucosa normal. No drainage or sinus tenderness. Throat: lips, mucosa, and tongue normal; teeth and gums normal Neck: supple, symmetrical, trachea midline Back: symmetric, no curvature. ROM normal. No CVA tenderness. Resp: non-labored on room air Cardio: Nl rate GI: distended, tympanic to percussion, JP in place drainage scant, Urostomy with 2 banders in place, midline incision well approximated, no  erythema or drainage.  Male genitalia: normal, mild penoscrotal edema.  Extremities: extremities normal, atraumatic, no cyanosis or edema and SCD's in place Pulses: 2+ and symmetric Skin: Skin color, texture, turgor normal. No rashes or lesions Lymph nodes: Cervical, supraclavicular, and axillary nodes normal. Neurologic: Grossly normal Incision/Wound: Abdm and  Perineal incision c/d/i.   Lab Results:   Recent Labs  08/15/17 0401 08/16/17 0350  WBC 11.3* 15.3*  HGB 7.6* 8.7*  HCT 22.9* 25.8*  PLT 222 283   BMET  Recent Labs  08/15/17 0401 08/16/17 0350  NA 135 134*  K 3.8 4.2  CL 100* 98*  CO2 27 25  GLUCOSE 120* 153*  BUN 12 13  CREATININE 0.77 0.81  CALCIUM 8.2* 8.5*   PT/INR No results for input(s): LABPROT, INR in the last 72 hours. ABG No results for input(s): PHART, HCO3 in the last 72 hours.  Invalid input(s): PCO2, PO2  Studies/Results: No results found.  Anti-infectives: Anti-infectives    Start     Dose/Rate Route Frequency Ordered Stop   08/11/17 1700  piperacillin-tazobactam (ZOSYN) IVPB 2.25 g     2.25 g 100 mL/hr over 30 Minutes Intravenous Every 8 hours 08/11/17 1627 08/12/17 2305   08/11/17 0647  piperacillin-tazobactam (ZOSYN) 3.375 GM/50ML IVPB    Comments:  Harle Stanford   : cabinet override      08/11/17 0647 08/11/17 0810   08/11/17 0600  piperacillin-tazobactam (ZOSYN) IVPB 3.375 g     3.375 g 100 mL/hr over 30 Minutes Intravenous 30 min pre-op 08/10/17 1025 08/11/17 0810   08/10/17 1400  neomycin (MYCIFRADIN) tablet 1,000 mg     1,000 mg Oral Every 8 hours  08/10/17 1029 08/11/17 1359   08/10/17 1200  metroNIDAZOLE (FLAGYL) tablet 500 mg     500 mg Oral Every 6 hours 08/10/17 1029 08/11/17 0007      Assessment/Plan:  1 - Bladder-Prostate Cancer - no further cancer directed care in house. Path pending. Continue current drains.  2 - Return Of Bowel Function  - Ambulating well. 2 BM this morning. Still distended and tympanic,  will continue to follow. If nausea and vomiting are refractory will place NGT. Given exam will continue clears and reassess diet in evening   3 - Anemia - Stable over the past few days, awaiting am labs  4 - Disposition / Rehab - Pending, Remain in house. Will need HHRN at least and possibly PT pending their eval at time of discharge, likely later this week.   Alla Feeling, MD 08/17/2017

## 2017-08-18 ENCOUNTER — Inpatient Hospital Stay (HOSPITAL_COMMUNITY): Payer: Medicare Other

## 2017-08-18 LAB — BASIC METABOLIC PANEL
ANION GAP: 10 (ref 5–15)
BUN: 17 mg/dL (ref 6–20)
CO2: 28 mmol/L (ref 22–32)
Calcium: 8.7 mg/dL — ABNORMAL LOW (ref 8.9–10.3)
Chloride: 93 mmol/L — ABNORMAL LOW (ref 101–111)
Creatinine, Ser: 1.02 mg/dL (ref 0.61–1.24)
GFR calc Af Amer: >60 mL/min (ref >60)
GFR calc non Af Amer: >60 mL/min (ref >60)
GLUCOSE: 149 mg/dL — AB (ref 65–99)
POTASSIUM: 3.6 mmol/L (ref 3.5–5.1)
Sodium: 131 mmol/L — ABNORMAL LOW (ref 135–145)

## 2017-08-18 LAB — CBC
HCT: 28.8 % — ABNORMAL LOW (ref 39.0–52.0)
Hemoglobin: 9.5 g/dL — ABNORMAL LOW (ref 13.0–17.0)
MCH: 31.3 pg (ref 26.0–34.0)
MCHC: 33 g/dL (ref 30.0–36.0)
MCV: 94.7 fL (ref 78.0–100.0)
Platelets: 341 10*3/uL (ref 150–400)
RBC: 3.04 MIL/uL — AB (ref 4.22–5.81)
RDW: 14 % (ref 11.5–15.5)
WBC: 19.5 10*3/uL — AB (ref 4.0–10.5)

## 2017-08-18 MED ORDER — LACTATED RINGERS IV SOLN
INTRAVENOUS | Status: DC
  Administered 2017-08-18: 17:00:00 via INTRAVENOUS

## 2017-08-18 MED ORDER — VERAPAMIL HCL 80 MG PO TABS
160.0000 mg | ORAL_TABLET | Freq: Three times a day (TID) | ORAL | Status: DC
  Administered 2017-08-18 – 2017-08-22 (×12): 160 mg via ORAL
  Filled 2017-08-18 (×12): qty 2

## 2017-08-18 MED ORDER — ACETAMINOPHEN 160 MG/5ML PO SOLN
1000.0000 mg | Freq: Three times a day (TID) | ORAL | Status: DC
  Administered 2017-08-18 – 2017-08-19 (×2): 1000 mg
  Filled 2017-08-18 (×2): qty 40.6

## 2017-08-18 NOTE — Consult Note (Signed)
[place] Nurse ostomy follow up Stoma type/location: RUQ ileal conduit with two stents intact Stomal assessment/size: per yesterday Peristomal assessment:not visualized Treatment options for stomal/peristomal skin: MARSI with thin film transparent dressing intact. Output: clear yellow urine Ostomy pouching: 1pc.convex pouching system with skin barrier ring Education provided: Just checked in with patient and family to make sure good for the weekend. Daughter and wife present. Supplies in room if needed over weekend. Pt states he is getting and NG tube today and he is hopeful it will relieve his pain. Enrolled patient in [place] Start Discharge program: Previously [place] nursing team will see next week, re-consult in meantime if needed.   Fara Olden, RN-C, WTA-C, [place] Wound Treatment Associate Ostomy Care Associate

## 2017-08-18 NOTE — Progress Notes (Signed)
PT Cancellation Note  Patient Details Name: Daniel Vega MRN: [id number] DOB: Apr 07, 1941   Cancelled Treatment:     pt out of room for KUB.  Pt has been evaluated with rec for [facility] PT.      Rica Koyanagi  PTA [facility]  Acute  Rehab Pager      [phone]

## 2017-08-18 NOTE — Progress Notes (Signed)
7 Days Post-Op   Subjective/Chief Complaint:  1 - Bladder-Prostate Cancer - s/p robotic cystoprostatectomy + total urethrectomy + pelvic lymphadenectomy and ileal conduit diversion on 08/11/17. Path pending. On floor   2 - Return Of Bowel Function  - s/p ileal ansatamosis at time of cystectomy / conduit. NPO initilaly post-op, advnaced to clears 10/28. He was on narcotics pre-op and therefore not entereg candidate. Although patient had 2 BMs yesterday, continued emesis yesterday evening and this morning - bilious. Slightly more distended on exam, will obtain KUB today    3 - Anemia - Pre-op Hgb's 10s, post-op high 7-8 and stable. Hgb stable around 9 the past few days   4 - Disposition / Rehab - independent at baseline. Ostomy RN helping with new ostomy teaching in house. PT evaluated yesterday and rec HH PT; Supervision/Assistance - 24 hour  Daniel Vega walked 3 times yesterday, continues to have  Bilious emesis last evening but had 2x BM this am. Still on clears for comfort    Of note, slight bump in Cr from 0.77 to 1.02, also WBC up to 19.5 from 15.5. Incisions C/d/i. No fevers   Objective: Vital signs in last 24 hours: Temp:  [98.2 F (36.8 C)-98.8 F (37.1 C)] 98.2 F (36.8 C) (11/02 0634) Pulse Rate:  [92-95] 92 (11/02 0634) Resp:  [18-20] 20 (11/02 0634) BP: (137-165)/(81-86) 137/86 (11/02 0634) SpO2:  [98 %-99 %] 98 % (11/02 0634) Last BM Date: 08/17/17  Intake/Output from previous day: 11/01 0701 - 11/02 0700 In: 100 [P.O.:100] Out: 1675 [Urine:900; Emesis/NG output:600; Drains:175] Intake/Output this shift: No intake/output data recorded.  General appearance: alert, cooperative, appears stated age and family at bedside Eyes: negative Nose: Nares normal. Septum midline. Mucosa normal. No drainage or sinus tenderness. Throat: lips, mucosa, and tongue normal; teeth and gums normal Neck: supple, symmetrical, trachea midline Back: symmetric, no curvature. ROM normal. No CVA  tenderness. Resp: non-labored on room air Cardio: Nl rate GI: distended, tympanic to percussion, JP in place drainage scant, Urostomy with 2 banders in place, midline incision well approximated, no erythema or drainage.  Male genitalia: normal, mild penoscrotal edema.  Extremities: extremities normal, atraumatic, no cyanosis or edema and SCD's in place Pulses: 2+ and symmetric Skin: Skin color, texture, turgor normal. No rashes or lesions Lymph nodes: Cervical, supraclavicular, and axillary nodes normal. Neurologic: Grossly normal Incision/Wound: Abdm and  Perineal incision c/d/i.   Lab Results:   Recent Labs  08/17/17 0738 08/18/17 0404  WBC 15.5* 19.5*  HGB 9.2* 9.5*  HCT 27.8* 28.8*  PLT 298 341   BMET  Recent Labs  08/17/17 0738 08/18/17 0404  NA 136 131*  K 3.5 3.6  CL 98* 93*  CO2 28 28  GLUCOSE 151* 149*  BUN 14 17  CREATININE 0.77 1.02  CALCIUM 8.8* 8.7*   PT/INR No results for input(s): LABPROT, INR in the last 72 hours. ABG No results for input(s): PHART, HCO3 in the last 72 hours.  Invalid input(s): PCO2, PO2  Studies/Results: No results found.  Anti-infectives: Anti-infectives    Start     Dose/Rate Route Frequency Ordered Stop   08/11/17 1700  piperacillin-tazobactam (ZOSYN) IVPB 2.25 g     2.25 g 100 mL/hr over 30 Minutes Intravenous Every 8 hours 08/11/17 1627 08/12/17 2305   08/11/17 0647  piperacillin-tazobactam (ZOSYN) 3.375 GM/50ML IVPB    Comments:  Harle Stanford   : cabinet override      08/11/17 0647 08/11/17 0810   08/11/17 0600  piperacillin-tazobactam (ZOSYN) IVPB 3.375 g     3.375 g 100 mL/hr over 30 Minutes Intravenous 30 min pre-op 08/10/17 1025 08/11/17 0810   08/10/17 1400  neomycin (MYCIFRADIN) tablet 1,000 mg     1,000 mg Oral Every 8 hours 08/10/17 1029 08/11/17 1359   08/10/17 1200  metroNIDAZOLE (FLAGYL) tablet 500 mg     500 mg Oral Every 6 hours 08/10/17 1029 08/11/17 0007      Assessment/Plan:  1 -  Bladder-Prostate Cancer - no further cancer directed care in house. Path pending. Continue current drains.  2 - Return Of Bowel Function  - Ambulating well. Slight increase in abdml distention. Continued tympany. KUB this morning to access ileus vs SBO. May require NGT given refractory emesis   3 - Anemia - Stable    4 - Disposition / Rehab - Pending, Remain in house. Will need HHRN at least and possibly PT pending their eval at time of discharge, likely later this week.   Alla Feeling, MD 08/18/2017

## 2017-08-19 ENCOUNTER — Inpatient Hospital Stay (HOSPITAL_COMMUNITY): Payer: Medicare Other

## 2017-08-19 LAB — CBC
HCT: 28.1 % — ABNORMAL LOW (ref 39.0–52.0)
Hemoglobin: 9.1 g/dL — ABNORMAL LOW (ref 13.0–17.0)
MCH: 31.1 pg (ref 26.0–34.0)
MCHC: 32.4 g/dL (ref 30.0–36.0)
MCV: 95.9 fL (ref 78.0–100.0)
Platelets: 308 10*3/uL (ref 150–400)
RBC: 2.93 MIL/uL — ABNORMAL LOW (ref 4.22–5.81)
RDW: 14.3 % (ref 11.5–15.5)
WBC: 13.5 10*3/uL — ABNORMAL HIGH (ref 4.0–10.5)

## 2017-08-19 LAB — BASIC METABOLIC PANEL
ANION GAP: 10 (ref 5–15)
BUN: 24 mg/dL — ABNORMAL HIGH (ref 6–20)
CHLORIDE: 92 mmol/L — AB (ref 101–111)
CO2: 31 mmol/L (ref 22–32)
Calcium: 8.5 mg/dL — ABNORMAL LOW (ref 8.9–10.3)
Creatinine, Ser: 1 mg/dL (ref 0.61–1.24)
GFR calc non Af Amer: >60 mL/min (ref >60)
Glucose, Bld: 112 mg/dL — ABNORMAL HIGH (ref 65–99)
Potassium: 3.5 mmol/L (ref 3.5–5.1)
SODIUM: 133 mmol/L — AB (ref 135–145)

## 2017-08-19 MED ORDER — BISACODYL 10 MG RE SUPP: 10.0000 mg | Freq: Every day | RECTAL | Status: DC | PRN

## 2017-08-19 MED ORDER — SODIUM CHLORIDE 0.9 % IV BOLUS (SEPSIS)
500.0000 mL | Freq: Once | INTRAVENOUS | Status: AC
  Administered 2017-08-19: 500 mL via INTRAVENOUS

## 2017-08-19 MED ORDER — ACETAMINOPHEN 10 MG/ML IV SOLN
1000.0000 mg | Freq: Three times a day (TID) | INTRAVENOUS | Status: AC
  Administered 2017-08-19 – 2017-08-20 (×3): 1000 mg via INTRAVENOUS
  Filled 2017-08-19 (×3): qty 100

## 2017-08-19 MED ORDER — PHENOL 1.4 % MT LIQD
1.0000 | OROMUCOSAL | Status: DC | PRN
  Filled 2017-08-19: qty 177

## 2017-08-19 MED ORDER — SODIUM CHLORIDE 0.9 % IV SOLN
INTRAVENOUS | Status: DC
  Administered 2017-08-19 – 2017-08-21 (×3): via INTRAVENOUS

## 2017-08-19 MED ORDER — SODIUM CHLORIDE 0.9 % IV BOLUS (SEPSIS)
1000.0000 mL | Freq: Once | INTRAVENOUS | Status: AC
  Administered 2017-08-19: 1000 mL via INTRAVENOUS

## 2017-08-19 NOTE — Progress Notes (Signed)
8 Days Post-Op   Subjective/Chief Complaint:  1 - Return Of Bowel Function  - s/p ileal ansatamosis at time of cystectomy / conduit. NPO initilaly post-op, advnaced to clears 10/28. He was on narcotics pre-op and therefore not entereg candidate. Refractory nausea and vomiting. NGT placed on 11/2 with 1.2L out initially. Overall > 2.0L out since insertion. Bilious. Patient's distention improved and subjectively  In less discomfort   2 - Metabolic alkalosis   - Hypochloremic hyponatremic metabolic alkalosis developing over past few days, not unexpected in setting of ileus, vomiting and now NGT. Hypovolemia likely the inciting etiolgoy given  uptrending Cr (0.7-> 1) and lower UOP over last 12 hours.    3 - Bladder-Prostate Cancer - s/p robotic cystoprostatectomy + total urethrectomy + pelvic lymphadenectomy and ileal conduit diversion on 08/11/17. Path pending. On floor   4 - Disposition / Rehab - independent at baseline. Ostomy RN helping with new ostomy teaching in house. PT evaluated yesterday and rec HH PT; Supervision/Assistance - 24 hour  Otherwise doing well, still on minimal comfort clears. WBC 13.5 down from 19 yeseteday. Cr stable at 1.0. Labs discussed above.   Objective: Vital signs in last 24 hours: Temp:  [98.1 F (36.7 C)-98.3 F (36.8 C)] 98.1 F (36.7 C) (11/03 0558) Pulse Rate:  [85-100] 85 (11/03 0558) Resp:  [18-20] 18 (11/03 0558) BP: (109-133)/(66-82) 133/66 (11/03 0558) SpO2:  [95 %-98 %] 98 % (11/03 0558) Last BM Date: 08/18/17  Intake/Output from previous day: 11/02 0701 - 11/03 0700 In: 1966.7 [P.O.:720; I.V.:1246.7] Out: 2385 [Urine:300; Emesis/NG output:2000; Drains:85] Intake/Output this shift: No intake/output data recorded.  General appearance: alert, cooperative, appears stated age and family at bedside Eyes: negative Nose: Nares normal. Septum midline. Mucosa normal. No drainage or sinus tenderness. Throat: lips, mucosa, and tongue normal; teeth and  gums normal Neck: supple, symmetrical, trachea midline Back: symmetric, no curvature. ROM normal. No CVA tenderness. Resp: non-labored on room air Cardio: Nl rate GI: distended but improved from yesterday  JP in place drainage scant, Urostomy with 2 banders in place and mucus, midline incision well approximated, no erythema or drainage.  Male genitalia: normal, mild penoscrotal edema.  Extremities: extremities normal, atraumatic, no cyanosis or edema and SCD's in place Pulses: 2+ and symmetric Skin: Skin color, texture, turgor normal. No rashes or lesions Lymph nodes: Cervical, supraclavicular, and axillary nodes normal. Neurologic: Grossly normal Incision/Wound:  Perineal incision well approximated and dry  Lab Results:   Recent Labs  08/18/17 0404 08/19/17 0449  WBC 19.5* 13.5*  HGB 9.5* 9.1*  HCT 28.8* 28.1*  PLT 341 308   BMET  Recent Labs  08/18/17 0404 08/19/17 0449  NA 131* 133*  K 3.6 3.5  CL 93* 92*  CO2 28 31  GLUCOSE 149* 112*  BUN 17 24*  CREATININE 1.02 1.00  CALCIUM 8.7* 8.5*   PT/INR No results for input(s): LABPROT, INR in the last 72 hours. ABG No results for input(s): PHART, HCO3 in the last 72 hours.  Invalid input(s): PCO2, PO2  Studies/Results: Dg Abd 2 Views  Result Date: 08/18/2017 CLINICAL DATA:  Ileus with nausea and vomiting. History of prostate carcinoma EXAM: ABDOMEN - 2 VIEW COMPARISON:  CT abdomen and pelvis July 07, 2017 FINDINGS: Frontal and left lateral decubitus images were obtained. There are multiple loops of bowel with air-fluid levels on decubitus image. There are foci of air in the subcutaneous tissues of the right abdomen. It is difficult to ascertain whether air on the decubitus  image is within the peritoneum in or outside of the peritoneum. There are double-J stent extending to the presumed ostomy in the right lower quadrant, not visualized. There is a filter in the inferior vena cava. IMPRESSION: 1. Multiple loops of  bowel dilatation with air-fluid levels. While this appearance may be due to ileus, a degree of bowel obstruction cannot be excluded. 2. There is air in the subcutaneous tissues of the abdomen bilaterally. It is difficult to exclude pneumoperitoneum given this air in the subcutaneous tissues. Air seen at the level of the liver edge on the decubitus image may represent air outside of the peritoneum. He if the presence of a small amount of pneumoperitoneum full affect clinical and possible surgical management at this time, CT abdomen and pelvis would be advised to further delineate. 3. Double-J stents extending to a presumed right lower quadrant ostomy. Inferior vena cava filter with the apex directed superiorly at the L2-3 level. These results will be called to the ordering clinician or representative by the Radiologist Assistant, and communication documented in the PACS or zVision Dashboard. Electronically Signed   By: Lowella Grip III M.D.   On: 08/18/2017 10:26   Dg Abd Portable 1v  Result Date: 08/18/2017 CLINICAL DATA:  NG tube placement.  Ileus. EXAM: PORTABLE ABDOMEN - 1 VIEW 3:38 p.m. COMPARISON:  08/18/2017 at 10:07 a.m. FINDINGS: NG tube is been inserted and the tip is in the fundus of the stomach. Partial decompression of the stomach since the prior study. Multiple persistent dilated loops of small bowel. Bilateral ureteral stents noted. IVC filter noted. Slight scarring at the lung bases. IMPRESSION: NG tube tip in the fundus of the stomach.  Persistent ileus. Electronically Signed   By: Lorriane Shire M.D.   On: 08/18/2017 15:55    Anti-infectives: Anti-infectives    Start     Dose/Rate Route Frequency Ordered Stop   08/11/17 1700  piperacillin-tazobactam (ZOSYN) IVPB 2.25 g     2.25 g 100 mL/hr over 30 Minutes Intravenous Every 8 hours 08/11/17 1627 08/12/17 2305   08/11/17 0647  piperacillin-tazobactam (ZOSYN) 3.375 GM/50ML IVPB    Comments:  Harle Stanford   : cabinet override       08/11/17 0647 08/11/17 0810   08/11/17 0600  piperacillin-tazobactam (ZOSYN) IVPB 3.375 g     3.375 g 100 mL/hr over 30 Minutes Intravenous 30 min pre-op 08/10/17 1025 08/11/17 0810   08/10/17 1400  neomycin (MYCIFRADIN) tablet 1,000 mg     1,000 mg Oral Every 8 hours 08/10/17 1029 08/11/17 1359   08/10/17 1200  metroNIDAZOLE (FLAGYL) tablet 500 mg     500 mg Oral Every 6 hours 08/10/17 1029 08/11/17 0007      Assessment/Plan:   1 - Return Of Bowel Function  - NGT in place with > 2.0 L out, bilious. Will keep in place for at least 48 hours for bowel rest. Encouraged ambulation, will also provide suppository today.   2 - Metabolic alkalosis - Will fluid bolus today given that etiology of electrolyte derangement is volume based, will PRN bolus as needed. BMP tomorrow   3- Bladder-Prostate Cancer - no further cancer directed care in house. Path pending. Continue current drains.  4 - Disposition / Rehab - Pending, Remain in house. Will need HHRN at least and possibly PT pending their eval at time of discharge, likely later this week.   Alla Feeling, MD 08/19/2017

## 2017-08-19 NOTE — Progress Notes (Signed)
Some cloudy, thick sediment noted in pt's ileoconduit. Dr. Dayle Points paged.

## 2017-08-20 LAB — BASIC METABOLIC PANEL
Anion gap: 9 (ref 5–15)
BUN: 20 mg/dL (ref 6–20)
CALCIUM: 8 mg/dL — AB (ref 8.9–10.3)
CO2: 29 mmol/L (ref 22–32)
CREATININE: 0.91 mg/dL (ref 0.61–1.24)
Chloride: 98 mmol/L — ABNORMAL LOW (ref 101–111)
Glucose, Bld: 94 mg/dL (ref 65–99)
Potassium: 3.3 mmol/L — ABNORMAL LOW (ref 3.5–5.1)
SODIUM: 136 mmol/L (ref 135–145)

## 2017-08-20 LAB — CBC
HCT: 25.8 % — ABNORMAL LOW (ref 39.0–52.0)
HEMOGLOBIN: 8.4 g/dL — AB (ref 13.0–17.0)
MCH: 31.6 pg (ref 26.0–34.0)
MCHC: 32.6 g/dL (ref 30.0–36.0)
MCV: 97 fL (ref 78.0–100.0)
PLATELETS: 290 10*3/uL (ref 150–400)
RBC: 2.66 MIL/uL — ABNORMAL LOW (ref 4.22–5.81)
RDW: 14.6 % (ref 11.5–15.5)
WBC: 13.2 10*3/uL — ABNORMAL HIGH (ref 4.0–10.5)

## 2017-08-20 LAB — PHOSPHORUS: PHOSPHORUS: 3.3 mg/dL (ref 2.5–4.6)

## 2017-08-20 LAB — MAGNESIUM: MAGNESIUM: 1.9 mg/dL (ref 1.7–2.4)

## 2017-08-20 MED ORDER — POTASSIUM CHLORIDE 10 MEQ/100ML IV SOLN
10.0000 meq | INTRAVENOUS | Status: AC
  Administered 2017-08-20 (×3): 10 meq via INTRAVENOUS
  Filled 2017-08-20 (×3): qty 100

## 2017-08-20 NOTE — Progress Notes (Signed)
9 Days Post-Op   Subjective/Chief Complaint:  1 - Return Of Bowel Function  - s/p ileal ansatamosis at time of cystectomy / conduit. NPO initilaly post-op, advnaced to clears 10/28. He was on narcotics pre-op and therefore not entereg candidate. NGT placed on 11/2 with high output, precipitous decline yesterday. NGT fell out around 12am on 11/4. Kept out given difficulty with reinsertion. Patient has had 3 Bms in the past 24 hours.    2 - Metabolic alkalosis   - Hypochloremic hyponatremic metabolic alkalosis developing over past few days, not unexpected in setting of ileus and vomiting. Given 1.5L of fluid boluses yesterday. Electrolytes have improved, with improving alkalosis. execllent UOP. Will continue IV fluids.   3 - Bladder-Prostate Cancer - s/p robotic cystoprostatectomy + total urethrectomy + pelvic lymphadenectomy and ileal conduit diversion on 08/11/17. Path pending. On floor   4 - Disposition / Rehab - independent at baseline. Ostomy RN helping with new ostomy teaching in house. PT evaluated yesterday and rec HH PT; Supervision/Assistance - 24 hour  Doing well since NGT has come out. Multiple BMs over the past 24 hours. Exam improving. Hopeful that ileus has begun to resolve.   Objective: Vital signs in last 24 hours: Temp:  [98 F (36.7 C)-98.8 F (37.1 C)] 98.1 F (36.7 C) (11/04 0600) Pulse Rate:  [76-94] 76 (11/04 0600) Resp:  [18] 18 (11/04 0600) BP: (135-140)/(67-72) 135/67 (11/04 0600) SpO2:  [96 %-98 %] 98 % (11/04 0600) Last BM Date: 08/19/17  Intake/Output from previous day: 11/03 0701 - 11/04 0700 In: 2320 [P.O.:600; I.V.:1420; IV Piggyback:300] Out: 1740 [Urine:750; Emesis/NG output:900; Drains:90] Intake/Output this shift: No intake/output data recorded.  General appearance: alert, cooperative, appears stated age and family at bedside Eyes: negative Nose: Nares normal. Septum midline. Mucosa normal. No drainage or sinus tenderness. Throat: lips, mucosa,  and tongue normal; teeth and gums normal Neck: supple, symmetrical, trachea midline Back: symmetric, no curvature. ROM normal. No CVA tenderness. Resp: non-labored on room air Cardio: Nl rate GI: distended but continued improvement  JP in place drainage scant, Urostomy with 2 banders in place and mucus, midline incision well approximated, no erythema or drainage.  Male genitalia: normal, mild penoscrotal edema.  Extremities: extremities normal, atraumatic, no cyanosis or edema and SCD's in place Pulses: 2+ and symmetric Skin: Skin color, texture, turgor normal. No rashes or lesions Lymph nodes: Cervical, supraclavicular, and axillary nodes normal. Neurologic: Grossly normal Incision/Wound:  Perineal incision well approximated and dry  Lab Results:  Recent Labs    08/19/17 0449 08/20/17 0438  WBC 13.5* 13.2*  HGB 9.1* 8.4*  HCT 28.1* 25.8*  PLT 308 290   BMET Recent Labs    08/19/17 0449 08/20/17 0438  NA 133* 136  K 3.5 3.3*  CL 92* 98*  CO2 31 29  GLUCOSE 112* 94  BUN 24* 20  CREATININE 1.00 0.91  CALCIUM 8.5* 8.0*   PT/INR No results for input(s): LABPROT, INR in the last 72 hours. ABG No results for input(s): PHART, HCO3 in the last 72 hours.  Invalid input(s): PCO2, PO2  Studies/Results: Dg Abd 1 View  Result Date: 08/19/2017 CLINICAL DATA:  Evaluate NG tube position. EXAM: ABDOMEN - 1 VIEW COMPARISON:  Radiograph yesterday FINDINGS: The previous enteric tube is not visualized in the lower thorax or upper abdomen. There is gaseous gastric distention. Dilated small bowel appears similar to prior. Bilateral ureteral stents with distal extent coursing in the right abdomen, presumed urostomy. IVC filter in place. IMPRESSION: Enteric  tube not visualized in the lower chest or abdomen. Tube may be proximally in the chest or pharynx. Recommend repositioning. Persistient gaseous distention of stomach and small bowel. Electronically Signed   By: Jeb Levering M.D.   On:  08/19/2017 22:45   Dg Abd 2 Views  Result Date: 08/18/2017 CLINICAL DATA:  Ileus with nausea and vomiting. History of prostate carcinoma EXAM: ABDOMEN - 2 VIEW COMPARISON:  CT abdomen and pelvis July 07, 2017 FINDINGS: Frontal and left lateral decubitus images were obtained. There are multiple loops of bowel with air-fluid levels on decubitus image. There are foci of air in the subcutaneous tissues of the right abdomen. It is difficult to ascertain whether air on the decubitus image is within the peritoneum in or outside of the peritoneum. There are double-J stent extending to the presumed ostomy in the right lower quadrant, not visualized. There is a filter in the inferior vena cava. IMPRESSION: 1. Multiple loops of bowel dilatation with air-fluid levels. While this appearance may be due to ileus, a degree of bowel obstruction cannot be excluded. 2. There is air in the subcutaneous tissues of the abdomen bilaterally. It is difficult to exclude pneumoperitoneum given this air in the subcutaneous tissues. Air seen at the level of the liver edge on the decubitus image may represent air outside of the peritoneum. He if the presence of a small amount of pneumoperitoneum full affect clinical and possible surgical management at this time, CT abdomen and pelvis would be advised to further delineate. 3. Double-J stents extending to a presumed right lower quadrant ostomy. Inferior vena cava filter with the apex directed superiorly at the L2-3 level. These results will be called to the ordering clinician or representative by the Radiologist Assistant, and communication documented in the PACS or zVision Dashboard. Electronically Signed   By: Lowella Grip III M.D.   On: 08/18/2017 10:26   Dg Abd Portable 1v  Result Date: 08/18/2017 CLINICAL DATA:  NG tube placement.  Ileus. EXAM: PORTABLE ABDOMEN - 1 VIEW 3:38 p.m. COMPARISON:  08/18/2017 at 10:07 a.m. FINDINGS: NG tube is been inserted and the tip is in the  fundus of the stomach. Partial decompression of the stomach since the prior study. Multiple persistent dilated loops of small bowel. Bilateral ureteral stents noted. IVC filter noted. Slight scarring at the lung bases. IMPRESSION: NG tube tip in the fundus of the stomach.  Persistent ileus. Electronically Signed   By: Lorriane Shire M.D.   On: 08/18/2017 15:55    Anti-infectives: Anti-infectives (From admission, onward)   Start     Dose/Rate Route Frequency Ordered Stop   08/11/17 1700  piperacillin-tazobactam (ZOSYN) IVPB 2.25 g     2.25 g 100 mL/hr over 30 Minutes Intravenous Every 8 hours 08/11/17 1627 08/12/17 2305   08/11/17 0647  piperacillin-tazobactam (ZOSYN) 3.375 GM/50ML IVPB    Comments:  Harle Stanford   : cabinet override      08/11/17 0647 08/11/17 0810   08/11/17 0600  piperacillin-tazobactam (ZOSYN) IVPB 3.375 g     3.375 g 100 mL/hr over 30 Minutes Intravenous 30 min pre-op 08/10/17 1025 08/11/17 0810   08/10/17 1400  neomycin (MYCIFRADIN) tablet 1,000 mg     1,000 mg Oral Every 8 hours 08/10/17 1029 08/11/17 1359   08/10/17 1200  metroNIDAZOLE (FLAGYL) tablet 500 mg     500 mg Oral Every 6 hours 08/10/17 1029 08/11/17 0007      Assessment/Plan:   1 - Return Of Bowel Function  -  3 Bms over past 24 hours, NGT out. Exam improving. Will continue to monitor today with IVF and clears ( pt will self regulate). If he continues to have Bms and no emesis, may advance to full clears in PM.   2 - Metabolic alkalosis - Improving alkalosis with IVF. Will keep on IVF until taking reliable clears. BMP tomorrow   3- Bladder-Prostate Cancer - no further cancer directed care in house. Path pending. Continue current drains.  4 - Disposition / Rehab - Pending, Remain in house. Will need HHRN at least and possibly PT pending their eval at time of discharge, likely later this week.   Alla Feeling, MD 08/20/2017

## 2017-08-20 NOTE — Progress Notes (Signed)
Last night, I checked NG placement via air in NG tube prior to administering medication through the tube.  I didn't hear anything in the abdomen and patient complained of feeling air in the mouth.  MD was notified. Xray obtained. NG was not in the abdomen or chest. NG was pulled out accidentally by patient shortly after xray.  2 RN's attempted placement of 38french NG and tube was too large (coiled in the mouth).  No [place] tubes were available at [facility] to attempt. MD notified and order was received to leave out NG tube until am rounds.  Patient was not nauseated overnight but did experience several episodes of diarrhea. Will monitor.Daniel Vega

## 2017-08-21 ENCOUNTER — Other Ambulatory Visit: Payer: Self-pay

## 2017-08-21 LAB — BASIC METABOLIC PANEL
Anion gap: 10 (ref 5–15)
BUN: 12 mg/dL (ref 6–20)
CALCIUM: 7.9 mg/dL — AB (ref 8.9–10.3)
CO2: 22 mmol/L (ref 22–32)
CREATININE: 0.76 mg/dL (ref 0.61–1.24)
Chloride: 105 mmol/L (ref 101–111)
GFR calc Af Amer: >60 mL/min (ref >60)
GLUCOSE: 74 mg/dL (ref 65–99)
Potassium: 3.5 mmol/L (ref 3.5–5.1)
SODIUM: 137 mmol/L (ref 135–145)

## 2017-08-21 LAB — CBC
HCT: 25 % — ABNORMAL LOW (ref 39.0–52.0)
Hemoglobin: 8.2 g/dL — ABNORMAL LOW (ref 13.0–17.0)
MCH: 31.9 pg (ref 26.0–34.0)
MCHC: 32.8 g/dL (ref 30.0–36.0)
MCV: 97.3 fL (ref 78.0–100.0)
PLATELETS: 277 10*3/uL (ref 150–400)
RBC: 2.57 MIL/uL — AB (ref 4.22–5.81)
RDW: 14.5 % (ref 11.5–15.5)
WBC: 12.2 10*3/uL — ABNORMAL HIGH (ref 4.0–10.5)

## 2017-08-21 LAB — CREATININE, FLUID (PLEURAL, PERITONEAL, JP DRAINAGE): CREAT FL: 0.9 mg/dL

## 2017-08-21 NOTE — Progress Notes (Signed)
10 Days Post-Op   Subjective/Chief Complaint:   1 - Return Of Bowel Function  - s/p ileal ansatamosis at time of cystectomy / conduit. NPO initilaly post-op, advnaced to clears 10/28. He was on narcotics pre-op and therefore not entereg candidate. NGT placed on 11/2 with high output, precipitous decline and removed 11/4 with resumed bowel function.   2 - Bladder-Prostate Cancer - s/p robotic cystoprostatectomy + total urethrectomy + pelvic lymphadenectomy and ileal conduit diversion on 08/11/17 for [id number] urothelial carcinoma.   3 - Disposition / Rehab - independent at baseline. Ostomy RN helping with new ostomy teaching in house.   Today "Al" is continuing to progress. Tollerating clears, ambulatory w/o assist, and return of BM's and flatus. Pain controlled and actually better than pre-op.   Objective: Vital signs in last 24 hours: Temp:  [98.4 F (36.9 C)-98.5 F (36.9 C)] 98.5 F (36.9 C) (11/05 0512) Pulse Rate:  [70-73] 70 (11/05 0512) Resp:  [14-16] 14 (11/05 0512) BP: (127-137)/(60-68) 127/67 (11/05 0512) SpO2:  [92 %-100 %] 97 % (11/05 0512) Weight:  [80.7 kg (177 lb 14.6 oz)] 80.7 kg (177 lb 14.6 oz) (11/05 0512) Last BM Date: 08/20/17  Intake/Output from previous day: 11/04 0701 - 11/05 0700 In: 2870.3 [P.O.:342; I.V.:2528.3] Out: 1440 [Urine:1400; Drains:40] Intake/Output this shift: No intake/output data recorded.  General appearance: alert, cooperative, appears stated age and family at bedside Eyes: negative Nose: Nares normal. Septum midline. Mucosa normal. No drainage or sinus tenderness. Throat: lips, mucosa, and tongue normal; teeth and gums normal Neck: supple, symmetrical, trachea midline Back: symmetric, no curvature. ROM normal. No CVA tenderness. Resp: non-labored on room air Cardio: Nl rate GI: Non-distended,  JP in place drainage scant, Urostomy with 2 banders in place and mucus, midline incision well approximated, no erythema or drainage.  Male  genitalia: normal, mild penoscrotal edema.  Extremities: extremities normal, atraumatic, no cyanosis or edema and SCD's in place Pulses: 2+ and symmetric Skin: Skin color, texture, turgor normal. No rashes or lesions Lymph nodes: Cervical, supraclavicular, and axillary nodes normal. Neurologic: Grossly normal Incision/Wound:  Perineal incision well approximated and dry   Lab Results:  Recent Labs    08/20/17 0438 08/21/17 0435  WBC 13.2* 12.2*  HGB 8.4* 8.2*  HCT 25.8* 25.0*  PLT 290 277   BMET Recent Labs    08/20/17 0438 08/21/17 0435  NA 136 137  K 3.3* 3.5  CL 98* 105  CO2 29 22  GLUCOSE 94 74  BUN 20 12  CREATININE 0.91 0.76  CALCIUM 8.0* 7.9*   PT/INR No results for input(s): LABPROT, INR in the last 72 hours. ABG No results for input(s): PHART, HCO3 in the last 72 hours.  Invalid input(s): PCO2, PO2    Anti-infectives: Anti-infectives (From admission, onward)   Start     Dose/Rate Route Frequency Ordered Stop   08/11/17 1700  piperacillin-tazobactam (ZOSYN) IVPB 2.25 g     2.25 g 100 mL/hr over 30 Minutes Intravenous Every 8 hours 08/11/17 1627 08/12/17 2305   08/11/17 0647  piperacillin-tazobactam (ZOSYN) 3.375 GM/50ML IVPB    Comments:  Harle Stanford   : cabinet override      08/11/17 0647 08/11/17 0810   08/11/17 0600  piperacillin-tazobactam (ZOSYN) IVPB 3.375 g     3.375 g 100 mL/hr over 30 Minutes Intravenous 30 min pre-op 08/10/17 1025 08/11/17 0810   08/10/17 1400  neomycin (MYCIFRADIN) tablet 1,000 mg     1,000 mg Oral Every 8 hours 08/10/17 1029 08/11/17  1359   08/10/17 1200  metroNIDAZOLE (FLAGYL) tablet 500 mg     500 mg Oral Every 6 hours 08/10/17 1029 08/11/17 0007      Assessment/Plan:  1 - Return Of Bowel Function  - ileus resolving clinically. Saline lock, advance to fulls.    2 - Bladder-Prostate Cancer - discussed path results and rec of adjuvant chemo at about 44mos post-op or so, no further cancer directed therapy this  admission.  JP Cr today.   3 - Disposition / Rehab - Will need HHRN for new urostomy teaching supplies. LIkely DC 11/6 PM v. 11/7 AM based on current progress.   [facility], Leontae Bostock 08/21/2017

## 2017-08-21 NOTE — Progress Notes (Signed)
Physical Therapy Treatment Patient Details Name: Daniel Vega MRN: [id number] DOB: 1941/07/13 Today's Date: 08/21/2017    History of Present Illness Pt admitted with Bladder-Prostate Cancer - s/p robotic cystoprostatectomy + total urethrectomy + pelvic lymphadenectomy and ileal conduit diversion on 08/11/17    PT Comments    Pt independently ambulated 250' with RW without loss of balance, SaO2 98% on RA, HR 105 while walking. PT goals have been met, will sign off. Encouraged pt to independently ambulate at least TID in halls. DC PT.    Follow Up Recommendations  No PT follow up     Equipment Recommendations  None recommended by PT    Recommendations for Other Services       Precautions / Restrictions Precautions Precautions: Other (comment) Precaution Comments: L JP drain, urostomy R UQ Restrictions Weight Bearing Restrictions: No    Mobility  Bed Mobility Overal bed mobility: Needs Assistance Bed Mobility: Rolling;Sidelying to Sit Rolling: Independent Sidelying to sit: Independent          Transfers Overall transfer level: Needs assistance Equipment used: Rolling walker (2 wheeled) Transfers: Sit to/from Stand Sit to Stand: Independent            Ambulation/Gait Ambulation/Gait assistance: Modified independent (Device/Increase time) Ambulation Distance (Feet): 250 Feet Assistive device: Rolling walker (2 wheeled) Gait Pattern/deviations: Trunk flexed;WFL(Within Functional Limits) Gait velocity: WFL   General Gait Details: slightly forward flexed posture    Stairs            Wheelchair Mobility    Modified Rankin (Stroke Patients Only)       Balance     Sitting balance-Leahy Scale: Good       Standing balance-Leahy Scale: Good                              Cognition Arousal/Alertness: Awake/alert Behavior During Therapy: WFL for tasks assessed/performed Overall Cognitive Status: Within Functional Limits for tasks  assessed                                        Exercises      General Comments        Pertinent Vitals/Pain Pain Assessment: No/denies pain    Home Living                      Prior Function            PT Goals (current goals can now be found in the care plan section) Acute Rehab PT Goals PT Goal Formulation: All assessment and education complete, DC therapy Progress towards PT goals: Goals met/education completed, patient discharged from PT    Frequency    Min 3X/week      PT Plan Current plan remains appropriate    Co-evaluation              AM-PAC PT "6 Clicks" Daily Activity  Outcome Measure  Difficulty turning over in bed (including adjusting bedclothes, sheets and blankets)?: None Difficulty moving from lying on back to sitting on the side of the bed? : None Difficulty sitting down on and standing up from a chair with arms (e.g., wheelchair, bedside commode, etc,.)?: None Help needed moving to and from a bed to chair (including a wheelchair)?: None Help needed walking in hospital room?: None Help needed climbing 3-5 steps with  a railing? : A Little 6 Click Score: 23    End of Session Equipment Utilized During Treatment: Gait belt Activity Tolerance: Patient tolerated treatment well Patient left: with call bell/phone within reach;with family/visitor present;in bed Nurse Communication: Mobility status PT Visit Diagnosis: Difficulty in walking, not elsewhere classified (R26.2)     Time: 5997-8776 PT Time Calculation (min) (ACUTE ONLY): 17 min  Charges:  $Gait Training: 8-22 mins                    G Codes:          Blondell Reveal Kistler 08/21/2017, 11:00 AM [phone]

## 2017-08-21 NOTE — Consult Note (Signed)
[place] Nurse ostomy follow up Stoma type/location: RUQ Ileal conduit with intact stents.  Both draining clear yellow urine.  Patient had been ambulating on unit prior to my arrival and was not connected to bedside drainage.  Was able to demonstrate how to re-connect today.   Stomal assessment/size: 1 1/4", this is smaller than previous pouch change.  Peristomal assessment: Resolving MARSI remains covered with transparent film and is resolving.  Left this intact today to promote moist wound healing.  Treatment options for stomal/peristomal skin: barrier ring and 1 piece convex urostomy pouch Output clear yellow urine Ostomy pouching: 1pc. Convex with barrier ring.  Has belt at bedside but not wearing.  Discussed how to apply this and reinforced that he may wish to incorporate this when he goes home and is more active.   Education provided: Pouch change is performed today with daughter and wife at bedside.  They observed the measuring and cutting of pouch.  Rationale for barrier ring and convexity is reviewed.  Stoma is smaller today, he reports.  Pink and patent.  Barrier ring and pouch applied. He does not wish to re-apply nighttime drainage at this time and feels comfortable doing that.  Reviewed emptying when 1/3 full and twice weekly pouch changes. He has supplies at bedside.  Discussed that [place] would be for teaching and they verbalize understanding.  Enrolled patient in [place] Start Discharge program: Yes [place] team following but consult if needed.  Domenic Moras RN BSN [place] Pager [phone]

## 2017-08-22 MED ORDER — TRAMADOL HCL 50 MG PO TABS: 50.0000 mg | ORAL_TABLET | Freq: Four times a day (QID) | ORAL | 0 refills | Status: AC | PRN

## 2017-08-22 NOTE — Progress Notes (Signed)
11 Days Post-Op   Subjective/Chief Complaint:   1 - Return Of Bowel Function  - s/p ileal ansatamosis at time of cystectomy / conduit. Post op ileus not resolved. Eating without difficulty, no nausea or emesis    2 - Bladder-Prostate Cancer - s/p robotic cystoprostatectomy + total urethrectomy + pelvic lymphadenectomy and ileal conduit diversion on 08/11/17 for pT4N0Mx urothelial carcinoma.   3 - Disposition / Rehab - independent at baseline. Ostomy RN helping with new ostomy teaching in house.   Doing very well. In good spirits. Tolerating a diet. Will eat regular food this morning. If doing well, tentative DC this evening.   Objective: Vital signs in last 24 hours: Temp:  [98.2 F (36.8 C)-99.6 F (37.6 C)] 99.6 F (37.6 C) (11/06 0655) Pulse Rate:  [67-73] 72 (11/06 0655) Resp:  [16-20] 20 (11/06 0655) BP: (139-142)/(63-66) 140/66 (11/06 0655) SpO2:  [99 %] 99 % (11/06 0655) Last BM Date: 08/21/17  Intake/Output from previous day: 11/05 0701 - 11/06 0700 In: 240 [P.O.:240] Out: 1890 [Urine:1850; Drains:40] Intake/Output this shift: No intake/output data recorded.  General appearance: alert, cooperative, appears stated age and family at bedside Eyes: negative Nose: Nares normal. Septum midline. Mucosa normal. No drainage or sinus tenderness. Throat: lips, mucosa, and tongue normal; teeth and gums normal Neck: supple, symmetrical, trachea midline Back: symmetric, no curvature. ROM normal. No CVA tenderness. Resp: non-labored on room air Cardio: Nl rate GI: Non-distended,  JP in place drainage scant, Urostomy with 2 banders in place and mucus, midline incision well approximated, no erythema or drainage.  Male genitalia: normal, mild penoscrotal edema.  Extremities: extremities normal, atraumatic, no cyanosis or edema and SCD's in place Pulses: 2+ and symmetric Skin: Skin color, texture, turgor normal. No rashes or lesions Lymph nodes: Cervical, supraclavicular, and  axillary nodes normal. Neurologic: Grossly normal Incision/Wound:  Perineal incision well approximated and dry   Lab Results:  Recent Labs    08/20/17 0438 08/21/17 0435  WBC 13.2* 12.2*  HGB 8.4* 8.2*  HCT 25.8* 25.0*  PLT 290 277   BMET Recent Labs    08/20/17 0438 08/21/17 0435  NA 136 137  K 3.3* 3.5  CL 98* 105  CO2 29 22  GLUCOSE 94 74  BUN 20 12  CREATININE 0.91 0.76  CALCIUM 8.0* 7.9*   PT/INR No results for input(s): LABPROT, INR in the last 72 hours. ABG No results for input(s): PHART, HCO3 in the last 72 hours.  Invalid input(s): PCO2, PO2    Anti-infectives: Anti-infectives (From admission, onward)   Start     Dose/Rate Route Frequency Ordered Stop   08/11/17 1700  piperacillin-tazobactam (ZOSYN) IVPB 2.25 g     2.25 g 100 mL/hr over 30 Minutes Intravenous Every 8 hours 08/11/17 1627 08/12/17 2305   08/11/17 0647  piperacillin-tazobactam (ZOSYN) 3.375 GM/50ML IVPB    Comments:  Harle Stanford   : cabinet override      08/11/17 0647 08/11/17 0810   08/11/17 0600  piperacillin-tazobactam (ZOSYN) IVPB 3.375 g     3.375 g 100 mL/hr over 30 Minutes Intravenous 30 min pre-op 08/10/17 1025 08/11/17 0810   08/10/17 1400  neomycin (MYCIFRADIN) tablet 1,000 mg     1,000 mg Oral Every 8 hours 08/10/17 1029 08/11/17 1359   08/10/17 1200  metroNIDAZOLE (FLAGYL) tablet 500 mg     500 mg Oral Every 6 hours 08/10/17 1029 08/11/17 0007      Assessment/Plan:  1 - Return Of Bowel Function  -  ileus resolving clinically. Doing well on full liquid, advance to regular   2 - Bladder-Prostate Cancer - discussed path results and rec of adjuvant chemo at about 60mos post-op or so, no further cancer directed therapy this admission. JP removed 11/5  3 - Disposition / Rehab - Will need HHRN for new urostomy teaching supplies. If doing well, will DC this evening.    Alla Feeling, MD 08/22/2017

## 2017-08-22 NOTE — Discharge Instructions (Signed)

## 2017-08-22 NOTE — Discharge Summary (Signed)
[facility] Discharge Summary  Admit date: 08/10/2017  Discharge date and time: 08/22/17   Discharge to: Home  Discharge Service: Urology  Discharge Attending Physician:  [facility]  Discharge  Diagnoses: Muscle Invasive Bladder Cancer  Secondary Diagnosis: Active Problems:   Bladder cancer ([place])   OR Procedures: Procedure(s): ROBOT ASSISTED LAPAROSCOPIC RADICAL CYSTOPROSTATECTOMY BILATERAL PELVIC [place] WITH INJECTION OF INDOCYANINE GREEN DYE 08/11/2017   Ancillary Procedures: None   Discharge Day Services: The patient was seen and examined by the Urology team both in the morning and immediately prior to discharge.  Vital signs and laboratory values were stable and within normal limits.  The physical exam was benign and unchanged and all surgical wounds were examined.  Discharge instructions were explained and all questions answered.  Subjective  No acute events overnight. Pain Controlled. No fever or chills.  Objective Patient Vitals for the past 8 hrs:  BP Temp Temp src Pulse Resp SpO2  08/22/17 0655 140/66 99.6 F (37.6 C) Oral 72 20 99 %   No intake/output data recorded.  General Appearance:        No acute distress Lungs:                       Normal work of breathing on room air Heart:                                Regular rate and rhythm Abdomen:                         Soft, non-tender, non-distended, Urostomy in place. Bander stents eminating. JP site healing. Perineal and abdm incision c/d/i Extremities:                      Warm and well perfused   Hospital Course:  The patient underwent robotic cystectomy with ex on 08/11/2017.  The patient tolerated the procedure well, was extubated in the OR, and afterwards was taken to the PACU for routine post-surgical care. When stable the patient was transferred to the floor.     The patients post operative course was complicated by ileus that required NGT drainage. By  POD9 the patients ileus had resolved. He quickly improved and by POD11 was tolerating a regular diet, having bm and his pain was well controlled.   He passed [employer] and ostomy supplies were coordinated.    The patient was discharged home 11 Days Post-Op, at which point was tolerating a regular solid diet, was able to void spontaneously, have adequate pain control with P.O. pain medication, and could ambulate without difficulty. The patient will follow up with [place] for post op check.   Of note, the patients pathology was discussed prior to discharge. He will follow up with medical oncology for adjuvant therapy in ~ 6 months.    Condition at Discharge: Improved  Discharge Medications:  Allergies as of 08/22/2017      Reactions   Codeine Palpitations   Made heart hurt   Ibuprofen Palpitations      Medication List    TAKE these medications   acetaminophen 500 MG tablet Commonly known as:  TYLENOL Take 1,000 mg by mouth every 6 (six) hours as needed for moderate pain.   allopurinol 100 MG tablet Commonly known as:  ZYLOPRIM Take 100 mg by mouth daily after breakfast.   ALPRAZolam  0.5 MG tablet Commonly known as:  XANAX Take 0.5 mg by mouth 3 (three) times daily as needed for anxiety (anxiety).   atorvastatin 40 MG tablet Commonly known as:  LIPITOR Take 1 tablet (40 mg total) by mouth every morning.   cephALEXin 500 MG capsule Commonly known as:  KEFLEX Take 1 capsule (500 mg total) by mouth 4 (four) times daily.   finasteride 5 MG tablet Commonly known as:  PROSCAR Take 5 mg by mouth daily after breakfast.   glucosamine-chondroitin 500-400 MG tablet Take 1 tablet by mouth 2 (two) times daily.   nitroGLYCERIN 0.4 MG SL tablet Commonly known as:  NITROSTAT Place 0.4 mg under the tongue every 5 (five) minutes as needed for chest pain. For chest pain   oxybutynin 10 MG 24 hr tablet Commonly known as:  DITROPAN XL Take 1 tablet (10 mg total) by mouth at bedtime.    traMADol 50 MG tablet Commonly known as:  ULTRAM Take 50 mg by mouth every 6 (six) hours as needed (for pain.).   verapamil 240 MG CR tablet Commonly known as:  CALAN-SR Take 240 mg by mouth 2 (two) times daily.   vitamin B-12 1000 MCG tablet Commonly known as:  CYANOCOBALAMIN Take 1,000 mcg by mouth daily.

## 2017-08-22 NOTE — Progress Notes (Signed)
Pt doing better with mobility per PT note. No longer needs HHPT. Pt will be receiving HHRN at discharge from [facility] and MD orders have been received. Marney Doctor RN,BSN,NCM [phone]

## 2017-08-22 NOTE — Care Management Important Message (Signed)
Important Message  Patient Details  Name: Daniel Vega MRN: [id number] Date of Birth: May 18, 1941   Medicare Important Message Given:  Yes    Kerin Salen 08/22/2017, 11:26 AMImportant Message  Patient Details  Name: Daniel Vega MRN: [id number] Date of Birth: February 12, 1941   Medicare Important Message Given:  Yes    Kerin Salen 08/22/2017, 11:26 AM

## 2017-08-23 DIAGNOSIS — Z9079 Acquired absence of other genital organ(s): Secondary | ICD-10-CM | POA: Diagnosis not present

## 2017-08-23 DIAGNOSIS — Z8701 Personal history of pneumonia (recurrent): Secondary | ICD-10-CM | POA: Diagnosis not present

## 2017-08-23 DIAGNOSIS — I10 Essential (primary) hypertension: Secondary | ICD-10-CM | POA: Diagnosis not present

## 2017-08-23 DIAGNOSIS — C61 Malignant neoplasm of prostate: Secondary | ICD-10-CM | POA: Diagnosis not present

## 2017-08-23 DIAGNOSIS — C675 Malignant neoplasm of bladder neck: Secondary | ICD-10-CM | POA: Diagnosis not present

## 2017-08-23 DIAGNOSIS — Z960 Presence of urogenital implants: Secondary | ICD-10-CM | POA: Diagnosis not present

## 2017-08-23 DIAGNOSIS — Z95828 Presence of other vascular implants and grafts: Secondary | ICD-10-CM | POA: Diagnosis not present

## 2017-08-23 DIAGNOSIS — Z86718 Personal history of other venous thrombosis and embolism: Secondary | ICD-10-CM | POA: Diagnosis not present

## 2017-08-23 DIAGNOSIS — M069 Rheumatoid arthritis, unspecified: Secondary | ICD-10-CM | POA: Diagnosis not present

## 2017-08-23 DIAGNOSIS — Z436 Encounter for attention to other artificial openings of urinary tract: Secondary | ICD-10-CM | POA: Diagnosis not present

## 2017-08-23 DIAGNOSIS — Z906 Acquired absence of other parts of urinary tract: Secondary | ICD-10-CM | POA: Diagnosis not present

## 2017-08-23 DIAGNOSIS — Z86711 Personal history of pulmonary embolism: Secondary | ICD-10-CM | POA: Diagnosis not present

## 2017-08-23 DIAGNOSIS — I252 Old myocardial infarction: Secondary | ICD-10-CM | POA: Diagnosis not present

## 2017-08-23 DIAGNOSIS — I2510 Atherosclerotic heart disease of native coronary artery without angina pectoris: Secondary | ICD-10-CM | POA: Diagnosis not present

## 2017-08-23 DIAGNOSIS — Z87891 Personal history of nicotine dependence: Secondary | ICD-10-CM | POA: Diagnosis not present

## 2017-08-23 DIAGNOSIS — M1A9XX0 Chronic gout, unspecified, without tophus (tophi): Secondary | ICD-10-CM | POA: Diagnosis not present

## 2017-08-23 DIAGNOSIS — Z85118 Personal history of other malignant neoplasm of bronchus and lung: Secondary | ICD-10-CM | POA: Diagnosis not present

## 2017-08-23 DIAGNOSIS — Z483 Aftercare following surgery for neoplasm: Secondary | ICD-10-CM | POA: Diagnosis not present

## 2017-08-25 DIAGNOSIS — C61 Malignant neoplasm of prostate: Secondary | ICD-10-CM | POA: Diagnosis not present

## 2017-08-25 DIAGNOSIS — Z483 Aftercare following surgery for neoplasm: Secondary | ICD-10-CM | POA: Diagnosis not present

## 2017-08-25 DIAGNOSIS — Z436 Encounter for attention to other artificial openings of urinary tract: Secondary | ICD-10-CM | POA: Diagnosis not present

## 2017-08-25 DIAGNOSIS — C675 Malignant neoplasm of bladder neck: Secondary | ICD-10-CM | POA: Diagnosis not present

## 2017-08-25 DIAGNOSIS — I2510 Atherosclerotic heart disease of native coronary artery without angina pectoris: Secondary | ICD-10-CM | POA: Diagnosis not present

## 2017-08-25 DIAGNOSIS — I252 Old myocardial infarction: Secondary | ICD-10-CM | POA: Diagnosis not present

## 2017-08-28 DIAGNOSIS — Z483 Aftercare following surgery for neoplasm: Secondary | ICD-10-CM | POA: Diagnosis not present

## 2017-08-28 DIAGNOSIS — I252 Old myocardial infarction: Secondary | ICD-10-CM | POA: Diagnosis not present

## 2017-08-28 DIAGNOSIS — Z436 Encounter for attention to other artificial openings of urinary tract: Secondary | ICD-10-CM | POA: Diagnosis not present

## 2017-08-28 DIAGNOSIS — C61 Malignant neoplasm of prostate: Secondary | ICD-10-CM | POA: Diagnosis not present

## 2017-08-28 DIAGNOSIS — I2510 Atherosclerotic heart disease of native coronary artery without angina pectoris: Secondary | ICD-10-CM | POA: Diagnosis not present

## 2017-08-28 DIAGNOSIS — C675 Malignant neoplasm of bladder neck: Secondary | ICD-10-CM | POA: Diagnosis not present

## 2017-08-30 ENCOUNTER — Ambulatory Visit (HOSPITAL_BASED_OUTPATIENT_CLINIC_OR_DEPARTMENT_OTHER): Payer: Medicare Other | Admitting: Oncology

## 2017-08-30 ENCOUNTER — Telehealth: Payer: Self-pay | Admitting: Oncology

## 2017-08-30 VITALS — BP 112/68 | HR 84 | Temp 97.8°F | Resp 18 | Ht 66.0 in | Wt 166.8 lb

## 2017-08-30 DIAGNOSIS — Z923 Personal history of irradiation: Secondary | ICD-10-CM

## 2017-08-30 DIAGNOSIS — Z8546 Personal history of malignant neoplasm of prostate: Secondary | ICD-10-CM

## 2017-08-30 DIAGNOSIS — C679 Malignant neoplasm of bladder, unspecified: Secondary | ICD-10-CM | POA: Diagnosis not present

## 2017-08-30 DIAGNOSIS — Z86711 Personal history of pulmonary embolism: Secondary | ICD-10-CM

## 2017-08-30 NOTE — Progress Notes (Signed)
Hematology and Oncology Follow Up Visit  Daniel Vega [id number] 08/18/1941 76 y.o. 08/30/2017 9:04 AM [name], MDGates, [name], MD   Principle Diagnosis: 76 year old gentleman with high-grade urothelial carcinoma of the bladder diagnosed in July 2018.  The final pathology revealed 30% squamous differentiation as well as 30% glandular component.  He had a pathological T4a N0.   Prior Therapy: He is status post robotic assisted laparoscopic radical cystoprostatectomy and bilateral lymphadenectomy completed by Dr. Tresa Moore completed on 08/11/2017.  The final pathological staging was T4aN0 with high-grade urothelial carcinoma with squamous and glandular differentiation.  Current therapy: Observation and surveillance.  Interim History: Daniel Vega presents today for a follow-up visit.  Since the last visit, he underwent radical cystectomy and tolerated it well.  He lost close to 40 pounds but most of it was leading up to the operation related to the tumor and the pain associated with it.  His pain is much improved and nearly resolved at this time.  He denies any abdominal pain or discomfort.  He denies any pelvic pain.  He does report very faint testicular discomfort that has dramatically improved since his operation.  He denies any fevers or chills.  His appetite is still down but improving slowly.  He does not report any headaches, blurry vision, syncope or seizures. He does not report any fevers, chills or sweats. He does not report any cough, wheezing or hemoptysis. He does not report any nausea, vomiting or abdominal pain. He does not report any constipation or diarrhea.  He does not report any skeletal complaints. He does not report any arthralgias or myalgias. He does not report any lymphadenopathy or petechiae.    Medications: I have reviewed the patient's current medications.  Current Outpatient Medications  Medication Sig Dispense Refill  . acetaminophen (TYLENOL) 500 MG tablet Take  1,000 mg by mouth every 6 (six) hours as needed for moderate pain.    Marland Kitchen allopurinol (ZYLOPRIM) 100 MG tablet Take 100 mg by mouth daily after breakfast.     . ALPRAZolam (XANAX) 0.5 MG tablet Take 0.5 mg by mouth 3 (three) times daily as needed for anxiety (anxiety).     Marland Kitchen atorvastatin (LIPITOR) 40 MG tablet Take 1 tablet (40 mg total) by mouth every morning. 90 tablet 3  . cephALEXin (KEFLEX) 500 MG capsule Take 1 capsule (500 mg total) by mouth 4 (four) times daily. (Patient not taking: Reported on 08/04/2017) 28 capsule 0  . finasteride (PROSCAR) 5 MG tablet Take 5 mg by mouth daily after breakfast.     . glucosamine-chondroitin 500-400 MG tablet Take 1 tablet by mouth 2 (two) times daily.     . nitroGLYCERIN (NITROSTAT) 0.4 MG SL tablet Place 0.4 mg under the tongue every 5 (five) minutes as needed for chest pain. For chest pain    . oxybutynin (DITROPAN XL) 10 MG 24 hr tablet Take 1 tablet (10 mg total) by mouth at bedtime. (Patient not taking: Reported on 08/04/2017) 10 tablet 0  . traMADol (ULTRAM) 50 MG tablet Take 50 mg by mouth every 6 (six) hours as needed (for pain.).    Marland Kitchen traMADol (ULTRAM) 50 MG tablet Take 1-2 tablets (50-100 mg total) every 6 (six) hours as needed by mouth for moderate pain or severe pain. Post-operatively 30 tablet 0  . verapamil (CALAN-SR) 240 MG CR tablet Take 240 mg by mouth 2 (two) times daily.     . vitamin B-12 (CYANOCOBALAMIN) 1000 MCG tablet Take 1,000 mcg by mouth daily.  No current facility-administered medications for this visit.      Allergies:  Allergies  Allergen Reactions  . Codeine Palpitations    Made heart hurt  . Ibuprofen Palpitations    Past Medical History, Surgical history, Social history, and Family History were reviewed and updated.  Physical Exam: Blood pressure 112/68, pulse 84, temperature 97.8 F (36.6 C), temperature source Oral, resp. rate 18, height 5\' 6"  (1.676 m), weight 166 lb 12.8 oz (75.7 kg), SpO2 100 %. ECOG:  1 General appearance: alert and cooperative Head: Normocephalic, without obvious abnormality Neck: no adenopathy Lymph nodes: Cervical, supraclavicular, and axillary nodes normal. Heart:regular rate and rhythm, S1, S2 normal, no murmur, click, rub or gallop Lung:chest clear, no wheezing, rales, normal symmetric air entry Abdomin: soft, non-tender, without masses or organomegaly EXT:no erythema, induration, or nodules   Lab Results: Lab Results  Component Value Date   WBC 12.2 (H) 08/21/2017   HGB 8.2 (L) 08/21/2017   HCT 25.0 (L) 08/21/2017   MCV 97.3 08/21/2017   PLT 277 08/21/2017     Chemistry      Component Value Date/Time   NA 137 08/21/2017 0435   NA 140 10/25/2013 0955   K 3.5 08/21/2017 0435   K 4.5 10/25/2013 0955   CL 105 08/21/2017 0435   CL 108 (H) 01/24/2013 1518   CO2 22 08/21/2017 0435   CO2 23 10/25/2013 0955   BUN 12 08/21/2017 0435   BUN 19.7 10/25/2013 0955   CREATININE 0.76 08/21/2017 0435   CREATININE 1.0 10/25/2013 0955      Component Value Date/Time   CALCIUM 7.9 (L) 08/21/2017 0435   CALCIUM 9.3 10/25/2013 0955   ALKPHOS 55 08/10/2017 1148   ALKPHOS 75 10/25/2013 0955   AST 14 (L) 08/10/2017 1148   AST 20 10/25/2013 0955   ALT 9 (L) 08/10/2017 1148   ALT 21 10/25/2013 0955   BILITOT 0.6 08/10/2017 1148   BILITOT 0.96 10/25/2013 0955       Impression and Plan:  76 year old gentleman with the following issues:  1. Invasive high-grade urothelial carcinoma involving the smooth muscle of the bladder diagnosed in July 2018. He presented with urinary retention and hematuria. He was found to have abnormal CT scan without any evidence of lymphadenopathy or metastatic disease. He is status post TURBT done on 05/24/2017 which showed high-grade invasive urothelial carcinoma. He does have history of prostate cancer that was treated with radiation in the past.  He is status post radical prostatectomy with a final pathology showed T4a N0 high-grade  urothelial carcinoma with glandular differentiation as well as squamous differentiation.  His operation completed on 08/11/2017.  These findings were reviewed today and discussed with the patient in detail.  The role for adjuvant systemic chemotherapy was discussed.  He is still recovering from his surgery and not quite ready to consider adjuvant chemotherapy.  I recommend repeat imaging studies in the next 2 months and depending on these findings we will determine whether he will receive adjuvant chemotherapy at the time.  His preference is not to receive any therapy although he understands he had a high risk tumor could develop into advanced disease at any time.  He would likely have scans scheduled by Dr. Tresa Moore after he sees him next week.   2. Prostate cancer: Status post radiation therapy with seed implants in 2009. His Gleason score 3+3 = 6 at the time.  No evidence of recurrence at this time.  3. History of pulmonary embolism: He has  an IVC filter in place and off anticoagulation.   4. Renal insufficiency: Related to his hydronephrosis and bladder cancer.  His creatinine is back to normal.  5. Follow-up: Will be in the next 2 months after CT scan to follow his progress.       Zola Button, MD 11/14/20189:04 AM

## 2017-08-30 NOTE — Telephone Encounter (Signed)
Gave avs and calendar for January 2019 °

## 2017-08-31 DIAGNOSIS — I252 Old myocardial infarction: Secondary | ICD-10-CM | POA: Diagnosis not present

## 2017-08-31 DIAGNOSIS — C675 Malignant neoplasm of bladder neck: Secondary | ICD-10-CM | POA: Diagnosis not present

## 2017-08-31 DIAGNOSIS — Z483 Aftercare following surgery for neoplasm: Secondary | ICD-10-CM | POA: Diagnosis not present

## 2017-08-31 DIAGNOSIS — Z436 Encounter for attention to other artificial openings of urinary tract: Secondary | ICD-10-CM | POA: Diagnosis not present

## 2017-08-31 DIAGNOSIS — I2510 Atherosclerotic heart disease of native coronary artery without angina pectoris: Secondary | ICD-10-CM | POA: Diagnosis not present

## 2017-08-31 DIAGNOSIS — C61 Malignant neoplasm of prostate: Secondary | ICD-10-CM | POA: Diagnosis not present

## 2017-09-04 DIAGNOSIS — Z436 Encounter for attention to other artificial openings of urinary tract: Secondary | ICD-10-CM | POA: Diagnosis not present

## 2017-09-04 DIAGNOSIS — Z483 Aftercare following surgery for neoplasm: Secondary | ICD-10-CM | POA: Diagnosis not present

## 2017-09-04 DIAGNOSIS — I252 Old myocardial infarction: Secondary | ICD-10-CM | POA: Diagnosis not present

## 2017-09-04 DIAGNOSIS — I2510 Atherosclerotic heart disease of native coronary artery without angina pectoris: Secondary | ICD-10-CM | POA: Diagnosis not present

## 2017-09-04 DIAGNOSIS — C61 Malignant neoplasm of prostate: Secondary | ICD-10-CM | POA: Diagnosis not present

## 2017-09-04 DIAGNOSIS — C675 Malignant neoplasm of bladder neck: Secondary | ICD-10-CM | POA: Diagnosis not present

## 2017-09-11 DIAGNOSIS — Z483 Aftercare following surgery for neoplasm: Secondary | ICD-10-CM | POA: Diagnosis not present

## 2017-09-11 DIAGNOSIS — C61 Malignant neoplasm of prostate: Secondary | ICD-10-CM | POA: Diagnosis not present

## 2017-09-11 DIAGNOSIS — C675 Malignant neoplasm of bladder neck: Secondary | ICD-10-CM | POA: Diagnosis not present

## 2017-09-11 DIAGNOSIS — Z436 Encounter for attention to other artificial openings of urinary tract: Secondary | ICD-10-CM | POA: Diagnosis not present

## 2017-09-11 DIAGNOSIS — I252 Old myocardial infarction: Secondary | ICD-10-CM | POA: Diagnosis not present

## 2017-09-11 DIAGNOSIS — I2510 Atherosclerotic heart disease of native coronary artery without angina pectoris: Secondary | ICD-10-CM | POA: Diagnosis not present

## 2017-09-18 DIAGNOSIS — C61 Malignant neoplasm of prostate: Secondary | ICD-10-CM | POA: Diagnosis not present

## 2017-09-18 DIAGNOSIS — I2510 Atherosclerotic heart disease of native coronary artery without angina pectoris: Secondary | ICD-10-CM | POA: Diagnosis not present

## 2017-09-18 DIAGNOSIS — Z436 Encounter for attention to other artificial openings of urinary tract: Secondary | ICD-10-CM | POA: Diagnosis not present

## 2017-09-18 DIAGNOSIS — I252 Old myocardial infarction: Secondary | ICD-10-CM | POA: Diagnosis not present

## 2017-09-18 DIAGNOSIS — Z483 Aftercare following surgery for neoplasm: Secondary | ICD-10-CM | POA: Diagnosis not present

## 2017-09-18 DIAGNOSIS — C675 Malignant neoplasm of bladder neck: Secondary | ICD-10-CM | POA: Diagnosis not present

## 2017-09-20 ENCOUNTER — Emergency Department (HOSPITAL_COMMUNITY)
Admission: EM | Admit: 2017-09-20 | Discharge: 2017-09-21 | Disposition: A | Discharge status: Home / Self Care | Payer: Medicare Other | Attending: Emergency Medicine | Admitting: Emergency Medicine

## 2017-09-20 ENCOUNTER — Other Ambulatory Visit: Payer: Self-pay

## 2017-09-20 ENCOUNTER — Emergency Department (HOSPITAL_COMMUNITY): Payer: Medicare Other

## 2017-09-20 ENCOUNTER — Encounter (HOSPITAL_COMMUNITY): Payer: Self-pay

## 2017-09-20 DIAGNOSIS — I2510 Atherosclerotic heart disease of native coronary artery without angina pectoris: Secondary | ICD-10-CM | POA: Diagnosis not present

## 2017-09-20 DIAGNOSIS — Y69 Unspecified misadventure during surgical and medical care: Secondary | ICD-10-CM | POA: Insufficient documentation

## 2017-09-20 DIAGNOSIS — I10 Essential (primary) hypertension: Secondary | ICD-10-CM | POA: Diagnosis not present

## 2017-09-20 DIAGNOSIS — Z87891 Personal history of nicotine dependence: Secondary | ICD-10-CM | POA: Diagnosis not present

## 2017-09-20 DIAGNOSIS — T8131XA Disruption of external operation (surgical) wound, not elsewhere classified, initial encounter: Secondary | ICD-10-CM | POA: Diagnosis not present

## 2017-09-20 DIAGNOSIS — Z8546 Personal history of malignant neoplasm of prostate: Secondary | ICD-10-CM | POA: Insufficient documentation

## 2017-09-20 DIAGNOSIS — E119 Type 2 diabetes mellitus without complications: Secondary | ICD-10-CM | POA: Insufficient documentation

## 2017-09-20 DIAGNOSIS — Z79899 Other long term (current) drug therapy: Secondary | ICD-10-CM | POA: Insufficient documentation

## 2017-09-20 DIAGNOSIS — T8189XA Other complications of procedures, not elsewhere classified, initial encounter: Secondary | ICD-10-CM | POA: Diagnosis not present

## 2017-09-20 HISTORY — DX: Malignant (primary) neoplasm, unspecified: C80.1

## 2017-09-20 LAB — BASIC METABOLIC PANEL
Anion gap: 8 (ref 5–15)
BUN: 20 mg/dL (ref 6–20)
CO2: 24 mmol/L (ref 22–32)
Calcium: 8.4 mg/dL — ABNORMAL LOW (ref 8.9–10.3)
Chloride: 101 mmol/L (ref 101–111)
Creatinine, Ser: 0.83 mg/dL (ref 0.61–1.24)
GFR calc Af Amer: >60 mL/min (ref >60)
GFR calc non Af Amer: >60 mL/min (ref >60)
Glucose, Bld: 108 mg/dL — ABNORMAL HIGH (ref 65–99)
Potassium: 3.6 mmol/L (ref 3.5–5.1)
Sodium: 133 mmol/L — ABNORMAL LOW (ref 135–145)

## 2017-09-20 LAB — CBC WITH DIFFERENTIAL/PLATELET
Basophils Absolute: 0 10*3/uL (ref 0.0–0.1)
Basophils Relative: 0 %
Eosinophils Absolute: 0.1 10*3/uL (ref 0.0–0.7)
Eosinophils Relative: 1 %
HCT: 25.2 % — ABNORMAL LOW (ref 39.0–52.0)
Hemoglobin: 8.2 g/dL — ABNORMAL LOW (ref 13.0–17.0)
Lymphocytes Relative: 14 %
Lymphs Abs: 1.9 10*3/uL (ref 0.7–4.0)
MCH: 30 pg (ref 26.0–34.0)
MCHC: 32.5 g/dL (ref 30.0–36.0)
MCV: 92.3 fL (ref 78.0–100.0)
Monocytes Absolute: 1.2 10*3/uL — ABNORMAL HIGH (ref 0.1–1.0)
Monocytes Relative: 8 %
Neutro Abs: 10.8 10*3/uL — ABNORMAL HIGH (ref 1.7–7.7)
Neutrophils Relative %: 77 %
Platelets: 253 10*3/uL (ref 150–400)
RBC: 2.73 MIL/uL — ABNORMAL LOW (ref 4.22–5.81)
RDW: 15.7 % — ABNORMAL HIGH (ref 11.5–15.5)
WBC: 13.9 10*3/uL — ABNORMAL HIGH (ref 4.0–10.5)

## 2017-09-20 MED ORDER — SODIUM CHLORIDE 0.9 % IV BOLUS (SEPSIS)
1000.0000 mL | Freq: Once | INTRAVENOUS | Status: AC
  Administered 2017-09-20: 1000 mL via INTRAVENOUS

## 2017-09-20 MED ORDER — SULFAMETHOXAZOLE-TRIMETHOPRIM 800-160 MG PO TABS
1.0000 | ORAL_TABLET | Freq: Once | ORAL | Status: AC
  Administered 2017-09-20: 1 via ORAL
  Filled 2017-09-20: qty 1

## 2017-09-20 NOTE — ED Triage Notes (Signed)
Patient brought in by EMS from home. States that at about 17:00 he went to the restroom and was soaked in milky red blood from surgical site. Patient states this surgery took place about 5 wks ago where he had his prostate and bladder removed. Patient believes a stitch may have popped loose.

## 2017-09-20 NOTE — ED Notes (Signed)
Bed: WA25 Expected date:  Expected time:  Means of arrival:  Comments: EMS [facility] 76 yo male bladder cancer/recent testicular surgery-sutures have come out-foul smelling drainage

## 2017-09-21 DIAGNOSIS — T8131XA Disruption of external operation (surgical) wound, not elsewhere classified, initial encounter: Secondary | ICD-10-CM | POA: Diagnosis not present

## 2017-09-21 MED ORDER — SULFAMETHOXAZOLE-TRIMETHOPRIM 800-160 MG PO TABS: 1.0000 | ORAL_TABLET | Freq: Two times a day (BID) | ORAL | 0 refills | Status: AC

## 2017-09-21 NOTE — ED Provider Notes (Signed)
[facility] DEPT Provider Note   CSN: [id number] Arrival date & time: 09/20/17  2102     History   Chief Complaint Chief Complaint  Patient presents with  . Bleeding from surgical site    HPI Daniel Vega is a 76 y.o. male  with history of CAD, prostate cancer and bladder cancer, nephrolithiasis, PE with IVC filter in place not anticoagulated, DM type II, HTN, RA, and history of lung cancer presents with chief complaint acute onset of drainage from wound which began earlier today.  Patient is status post laparoscopic radical cystoprostatectomy with ileal conduit diversion on August 11, 2017.  He has an ileostomy in place.  Since the procedure, he notes decreased appetite and fatigue as well as progressively worsening scrotal pain around the scrotal incision site.  States pain is constant, dull at rest but sharp with movement.  Earlier today he states that he went to the bathroom and noted foul-smelling bloody and purulent drainage from this incision site.  He notes a small area where the wound opened.  Patient's wife called the urologist office who advised him to present to the ED if bleeding resumes or he develops a fever or chills.  Approximately 2 hours prior to my assessment, bleeding began again and patient presented to the emergency department for further evaluation.  Also notes mild aching low back pain which began earlier today. He denies abdominal pain, nausea, vomiting, fevers, chills, chest pain, or shortness of breath.  He notes decreased urine and stool production secondary to decreased appetite.  He denies any change in his urine and no hematuria but cannot comment to dysuria, urgency, or frequency.  The history is provided by the patient.    Past Medical History:  Diagnosis Date  . CAD (coronary artery disease)    CARDIOLOGIST-  DR Martinique-- hx MI 1994, medically treated;  cath 1997 dRCA occlusion w/ collateral flow  . Cancer [facility])    Prostate and bladder  . Chronic gout    as of 05-23-2017 -- per pt stable   . Dysuria   . Elevated ferritin level    followed by dr Alen Blew ([facility])-- per last note differiential dx hx alcohol use versus questionable hemochromatosis  . Full dentures   . Hematuria    chronic  . History of adenomatous polyp of colon   . History of kidney stones    multiple-  calcium oxalate  . History of MI (myocardial infarction)    remote hx 1994-- treated medically  . History of pleural effusion 07/2014   in setting acute blood loss taking xarelto for PE--- s/p  right pleural thoracentesis w/ 600cc  . History of prostate cancer    urologist-  dr Diona Fanti---  primary adenocarcinoma-- Gleason 3+3--  06-27-2008  s/p  radioactive prostate seed implants  . History of pulmonary embolism 07/14/2014   w/  RLL pulmary infarction  . History of pulmonary infarction 07/14/2014   RLL due to PE  . History of recurrent pneumonia   . History of urinary retention    10/ 2015 gross hematuria / clots due to taking xarelto  . Hydronephrosis of right kidney   . Hypercholesteremia   . Hypertension   . Nocturia   . OA (osteoarthritis)    joints  . Personal history of lung cancer 05/1998   adenosquamous left lower lobe primary carcinoma--- s/p left lower lobectomy 08/ 1999 /  per last CT no recurrence  . RA (rheumatoid arthritis) ([place])  hands  . Renal insufficiency   . S/P IVC filter placed 07-28-2014  . Type 2 diabetes mellitus ([place])   . Wears glasses     Patient Active Problem List   Diagnosis Date Noted  . Bladder cancer ([place]) 08/10/2017  . Urinary retention 08/13/2014  . S/P IVC filter 08/13/2014  . Hydronephrosis, right 07/27/2014  . Chronic anticoagulation 07/27/2014  . Acute blood loss anemia 07/27/2014  . Acute renal failure ([place]) 07/27/2014  . Loss of weight 07/27/2014  . Obesity (BMI 30-39.9) 07/27/2014  . Hematuria 07/26/2014  . Embolism, pulmonary with infarction ([place])  07/15/2014  . Rheumatoid arthritis ([place]) 07/15/2014  . HNP (herniated nucleus pulposus), lumbar 08/10/2012  . AKI (acute kidney injury) with ATN 02/09/2012  . Hypertension   . Hypercholesteremia   . Glucose intolerance (impaired glucose tolerance)   . Kidney stones, calcium oxalate   . CAD (coronary artery disease)   . 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 Adenomatous colon polyp   . Prostate cancer [facility])     Past Surgical History:  Procedure Laterality Date  . CARDIAC CATHETERIZATION  1997  dr Martinique  . CARDIAC CATHETERIZATION  02-11-2011  dr Martinique   (abnormal stress echo) single vessel occlusive artherosclerotic CAD involving the distal RCA w/ excellent collateral flow;  good LVSF, ef 55%  . CATARACT EXTRACTION W/PHACO Left 12/10/2015   Procedure: CATARACT EXTRACTION PHACO AND INTRAOCULAR LENS PLACEMENT LEFT EYE CDE=10.98;  Surgeon: Tonny Branch, MD;  Location: [facility];  Service: Ophthalmology;  Laterality: Left;  . CATARACT EXTRACTION W/PHACO Right 01/07/2016   Procedure: CATARACT EXTRACTION PHACO AND INTRAOCULAR LENS PLACEMENT (IOC);  Surgeon: Tonny Branch, MD;  Location: [facility];  Service: Ophthalmology;  Laterality: Right;  CDE:15.11  . COLONOSCOPY  last one 2016 approx.  . CYSTOSCOPY W/ URETERAL STENT PLACEMENT Bilateral 05/24/2017   Procedure: CYSTOSCOPY WITH RETROGRADE PYELOGRAM/ POSSIBLE URETERAL STENT PLACEMENT;  Surgeon: Franchot Gallo, MD;  Location: [facility];  Service: Urology;  Laterality: Bilateral;   [phone] MEDICARE-[id number] BCBS-[id number]  . CYSTOSCOPY WITH BIOPSY N/A 05/24/2017   Procedure: CYSTOSCOPY WITH BIOPSY;  Surgeon: Franchot Gallo, MD;  Location[facility];  Service: Urology;  Laterality: N/A;  . CYSTOSCOPY WITH INJECTION N/A 08/11/2017   Procedure: CYSTOSCOPY WITH INJECTION OF INDOCYANINE GREEN DYE;  Surgeon: Alexis Frock, MD;  Location: [facility];  Service: Urology;  Laterality: N/A;  . CYSTOSCOPY WITH  RETROGRADE PYELOGRAM, URETEROSCOPY AND STENT PLACEMENT Right 07/31/2014   Procedure: CYSTOSCOPY WITH RETROGRADE PYELOGRAM, URETEROSCOPY WITH FULGURATION AND STENT PLACEMENT;  Surgeon: [name], MD;  Location: [facility];  Service: Urology;  Laterality: Right;  . IVC FILTER INSERTION  07/28/2014  . LUMBAR LAMINECTOMY/DECOMPRESSION MICRODISCECTOMY  08/10/2012   Procedure: LUMBAR LAMINECTOMY/DECOMPRESSION MICRODISCECTOMY 1 LEVEL;  Surgeon: Winfield Cunas, MD;  Location: [facility];  Service: Neurosurgery;  Laterality: Left;  LEFT Lumbar five-sacral one microdiskectomy  . LUNG LOBECTOMY Left 06-11-1998   dr Cyndia Bent   LLL  . RADIOACTIVE PROSTATE SEED IMPLANTS  06-27-2008  dr Diona Fanti  at [facility]  . TESTICLE TORSION REDUCTION Right 1962  . TRANSTHORACIC ECHOCARDIOGRAM  07/15/2014   severe concentrial LVH, ef 16-60%, grade 1 diastolic dysfunction/  mild TR/  PASP 64mmHg  . VEIN LIGATION AND STRIPPING Right        Home Medications    Prior to Admission medications   Medication Sig Start Date End Date Taking? Authorizing Provider  acetaminophen (TYLENOL) 500 MG tablet Take 1,000 mg by mouth every  6 (six) hours as needed for moderate pain.   Yes [provider]  allopurinol (ZYLOPRIM) 100 MG tablet Take 100 mg by mouth daily after breakfast.  07/23/12  Yes [provider]  atorvastatin (LIPITOR) 40 MG tablet Take 1 tablet (40 mg total) by mouth every morning. 04/30/15  Yes Martinique, Peter M, MD  finasteride (PROSCAR) 5 MG tablet Take 5 mg by mouth daily after breakfast.    Yes [provider]  folic acid (FOLVITE) 1 MG tablet Take 1 mg by mouth daily.   Yes [provider]  glucosamine-chondroitin 500-400 MG tablet Take 1 tablet by mouth 2 (two) times daily.    Yes [provider]  oxybutynin (DITROPAN) 5 MG tablet Take 5 mg by mouth 3 (three) times daily.  07/17/17  Yes [provider]  verapamil (CALAN-SR) 240 MG CR tablet Take 240 mg by  mouth 2 (two) times daily.    Yes [provider]  vitamin B-12 (CYANOCOBALAMIN) 1000 MCG tablet Take 1,000 mcg by mouth daily.   Yes [provider]  ALPRAZolam Duanne Moron) 0.5 MG tablet Take 0.5 mg by mouth 3 (three) times daily as needed for anxiety (anxiety).     [provider]  cephALEXin (KEFLEX) 500 MG capsule Take 1 capsule (500 mg total) by mouth 4 (four) times daily. Patient not taking: Reported on 08/04/2017 07/07/17   Palumbo, April, MD  nitroGLYCERIN (NITROSTAT) 0.4 MG SL tablet Place 0.4 mg under the tongue every 5 (five) minutes as needed for chest pain. For chest pain    [provider]  oxybutynin (DITROPAN XL) 10 MG 24 hr tablet Take 1 tablet (10 mg total) by mouth at bedtime. Patient not taking: Reported on 08/04/2017 07/07/17   Palumbo, April, MD  Oxycodone HCl 10 MG TABS  07/12/17   [provider]  sulfamethoxazole-trimethoprim (BACTRIM DS,SEPTRA DS) 800-160 MG tablet Take 1 tablet by mouth 2 (two) times daily for 7 days. 09/21/17 09/28/17  Rodell Perna A, PA-C  traMADol (ULTRAM) 50 MG tablet Take 50 mg by mouth every 6 (six) hours as needed (for pain.).    [provider]  traMADol (ULTRAM) 50 MG tablet Take 1-2 tablets (50-100 mg total) every 6 (six) hours as needed by mouth for moderate pain or severe pain. Post-operatively 08/22/17 08/22/18  Alexis Frock, MD    Family History Family History  Problem Relation Age of Onset  . Heart disease Mother   . Hypertension Mother   . Heart disease Father   . Hypertension Father   . Stroke Father   . Other Brother        spinal meningitis  . Other Brother        MVA  . Cancer Sister        stomach    Social History Social History   Tobacco Use  . Smoking status: Former Smoker    Packs/day: 2.00    Years: 35.00    Pack years: 70.00    Types: Cigarettes    Last attempt to quit: 10/17/1982    Years since quitting: 34.9  . Smokeless tobacco: Former Systems developer    Types: I[place] date: 03/15/2012  Substance Use Topics  . Alcohol use: No    Alcohol/week: 7.0 oz    Types: 14 Standard drinks or equivalent per week    Comment: hx alcohol abuse  . Drug use: No     Allergies   Codeine and Ibuprofen   Review  of Systems Review of Systems  Constitutional: Negative for chills and fever.  Respiratory: Negative for shortness of breath.   Cardiovascular: Negative for chest pain.  Gastrointestinal: Negative for abdominal pain, nausea and vomiting.  Genitourinary: Positive for scrotal swelling and testicular pain. Negative for hematuria, penile pain and penile swelling.  Musculoskeletal: Positive for back pain.  All other systems reviewed and are negative.    Physical Exam Updated Vital Signs BP 126/67   Pulse 76   Temp 98.4 F (36.9 C) (Oral)   Resp 18   Ht 5\' 5"  (1.651 m)   Wt 72.1 kg (159 lb)   SpO2 98%   BMI 26.46 kg/m   Physical Exam  Constitutional: He appears well-developed and well-nourished. No distress.  HENT:  Head: Normocephalic and atraumatic.  Eyes: Conjunctivae are normal. Right eye exhibits no discharge. Left eye exhibits no discharge.  Neck: No JVD present. No tracheal deviation present.  Cardiovascular: Normal rate, regular rhythm and normal heart sounds.  Pulmonary/Chest: Effort normal and breath sounds normal.  Abdominal: Soft. Bowel sounds are normal. He exhibits no distension. There is tenderness.  Ileostomy in place in the right lower quadrant with no surrounding erythema or tenderness.  Mild suprapubic tenderness to palpation but patient states this is chronic and unchanged.  Well-healed surgical incisions noted to the abdomen.  Murphy sign absent, no CVA tenderness   Genitourinary: Penis normal. No penile tenderness.  Genitourinary Comments: Examination performed in the presence of a chaperone.  There is a 3 mm opening at the top of the surgical incision on the underside of the scrotum that is draining foul-smelling red  discharge.  There is some swelling noted to the perineum.  There is diffuse tenderness to palpation of the scrotum and testicles. No masses or lesions noted. No penile pain, tenderness, swelling, or erythema.  Musculoskeletal: He exhibits no edema.  No midline spine TTP, no paraspinal muscle tenderness, no deformity, crepitus, or step-off noted.   Neurological: He is alert.  Skin: Skin is warm and dry. No erythema.  Psychiatric: He has a normal mood and affect. His behavior is normal.  Nursing note and vitals reviewed.    ED Treatments / Results  Labs (all labs ordered are listed, but only abnormal results are displayed) Labs Reviewed  CBC WITH DIFFERENTIAL/PLATELET - Abnormal; Notable for the following components:      Result Value   WBC 13.9 (*)    RBC 2.73 (*)    Hemoglobin 8.2 (*)    HCT 25.2 (*)    RDW 15.7 (*)    Neutro Abs 10.8 (*)    Monocytes Absolute 1.2 (*)    All other components within normal limits  BASIC METABOLIC PANEL - Abnormal; Notable for the following components:   Sodium 133 (*)    Glucose, Bld 108 (*)    Calcium 8.4 (*)    All other components within normal limits  AEROBIC CULTURE (SUPERFICIAL SPECIMEN)    EKG  EKG Interpretation None       Radiology No results found.  Procedures Procedures (including critical care time)  Medications Ordered in ED Medications  sodium chloride 0.9 % bolus 1,000 mL (0 mLs Intravenous Stopped 09/21/17 0026)  sulfamethoxazole-trimethoprim (BACTRIM DS,SEPTRA DS) 800-160 MG per tablet 1 tablet (1 tablet Oral Given 09/20/17 2315)     Initial Impression / Assessment and Plan / ED Course  I have reviewed the triage vital signs and the nursing notes.  Pertinent labs & imaging results that were available  during my care of the patient were reviewed by me and considered in my medical decision making (see chart for details).     Patient with small area of wound dehiscence from surgical incision which began earlier  today.  Afebrile, vital signs are stable.  Nontoxic in appearance.  He is approximately 6 weeks status post radical cystoprostatectomy and followed up with his urologist Dr. Tresa Moore yesterday.  He has a slight leukocytosis, no significant electrolyte abnormalities.  Drainage was cultured.  Question of abscess versus hematoma drainage.  I doubt testicular torsion.  Examination of the abdomen is benign and his surgical incisions are well healing. I doubt obstruction, perforation, UTI, pyelonephritis or other acute surgical abdominal pathology.  Dr. Billy Fischer spoke with urologist Dr. Matilde Sprang who recommends starting the patient on Bactrim.  He states he is stable for discharge home and follow-up with Dr. Tresa Moore in the office tomorrow morning.  Spoke with patient and patient's family and they verbalized understanding of and agreement with plan.  Discussed indications for return to the ED.  Patient hemodynamically stable with no complaints prior to discharge.  Patient seen and evaluated by Dr. Billy Fischer  Who agrees with assessment and plan at this time.  Final Clinical Impressions(s) / ED Diagnoses   Final diagnoses:  Wound dehiscence, surgical, initial encounter    ED Discharge Orders        Ordered    sulfamethoxazole-trimethoprim (BACTRIM DS,SEPTRA DS) 800-160 MG tablet  2 times daily     09/21/17 0020       Renita Papa, PA-C 09/21/17 9373    Gareth Morgan, MD 09/21/17 1249

## 2017-09-21 NOTE — Discharge Instructions (Signed)
Please take all of your antibiotics until finished!   You may develop abdominal discomfort or diarrhea from the antibiotic.  You may help offset this with probiotics which you can buy or get in yogurt. Do not eat  or take the probiotics until 2 hours after your antibiotic.   Call Dr. Zettie Pho office tomorrow morning to schedule a follow-up appointment that day.  Return to the ED if any concerning signs or symptoms develop.

## 2017-09-23 LAB — AEROBIC CULTURE  (SUPERFICIAL SPECIMEN)

## 2017-09-23 LAB — AEROBIC CULTURE W GRAM STAIN (SUPERFICIAL SPECIMEN)

## 2017-09-24 ENCOUNTER — Telehealth: Payer: Self-pay

## 2017-09-24 NOTE — Telephone Encounter (Signed)
Post ED Visit - Positive Culture Follow-up  Culture report reviewed by antimicrobial stewardship pharmacist:  []  Elenor Quinones, Pharm.D. []  Heide Guile, Pharm.D., BCPS AQ-ID [x]  Parks Neptune, Pharm.D., BCPS []  Alycia Rossetti, Pharm.D., BCPS []  [place], Pharm.D., BCPS, AAHIVP []  Legrand Como, Pharm.D., BCPS, AAHIVP []  Salome Arnt, PharmD, BCPS []  Dimitri Ped, PharmD, BCPS []  Vincenza Hews, PharmD, BCPS  Positive aerobic culture Treated with Bactrim DS, organism sensitive to the same and no further patient follow-up is required at this time.  Genia Del 09/24/2017, 11:34 AM

## 2017-09-24 NOTE — Telephone Encounter (Signed)
Post ED Visit - Positive Culture Follow-up  Culture report reviewed by antimicrobial stewardship pharmacist:  []  Elenor Quinones, Pharm.D. []  Heide Guile, Pharm.D., BCPS AQ-ID [x]  Parks Neptune, Pharm.D., BCPS []  Alycia Rossetti, Pharm.D., BCPS []  [place], Pharm.D., BCPS, AAHIVP []  Legrand Como, Pharm.D., BCPS, AAHIVP []  Salome Arnt, PharmD, BCPS []  Dimitri Ped, PharmD, BCPS []  Vincenza Hews, PharmD, BCPS  Positive aerobic culture Treated with Bactrim DS, organism sensitive to the same and no further patient follow-up is required at this time.  Genia Del 09/24/2017, 11:36 AM

## 2017-10-02 DIAGNOSIS — C675 Malignant neoplasm of bladder neck: Secondary | ICD-10-CM | POA: Diagnosis not present

## 2017-10-02 DIAGNOSIS — C61 Malignant neoplasm of prostate: Secondary | ICD-10-CM | POA: Diagnosis not present

## 2017-10-02 DIAGNOSIS — Z436 Encounter for attention to other artificial openings of urinary tract: Secondary | ICD-10-CM | POA: Diagnosis not present

## 2017-10-02 DIAGNOSIS — I252 Old myocardial infarction: Secondary | ICD-10-CM | POA: Diagnosis not present

## 2017-10-02 DIAGNOSIS — Z483 Aftercare following surgery for neoplasm: Secondary | ICD-10-CM | POA: Diagnosis not present

## 2017-10-02 DIAGNOSIS — I2510 Atherosclerotic heart disease of native coronary artery without angina pectoris: Secondary | ICD-10-CM | POA: Diagnosis not present

## 2017-10-04 ENCOUNTER — Ambulatory Visit (HOSPITAL_COMMUNITY): Payer: Medicare Other | Admitting: Anesthesiology

## 2017-10-04 ENCOUNTER — Other Ambulatory Visit: Payer: Self-pay

## 2017-10-04 ENCOUNTER — Encounter (HOSPITAL_COMMUNITY): Payer: Self-pay | Admitting: *Deleted

## 2017-10-04 ENCOUNTER — Encounter: Payer: Self-pay | Admitting: *Deleted

## 2017-10-04 ENCOUNTER — Ambulatory Visit (HOSPITAL_COMMUNITY)
Admission: AD | Admit: 2017-10-04 | Discharge: 2017-10-04 | Disposition: A | Discharge status: Home / Self Care | Payer: Medicare Other | Source: Ambulatory Visit | Attending: Urology | Admitting: Urology

## 2017-10-04 ENCOUNTER — Encounter (HOSPITAL_COMMUNITY)
Admission: AD | Disposition: A | Discharge status: Home / Self Care | Payer: Self-pay | Source: Ambulatory Visit | Attending: Urology

## 2017-10-04 ENCOUNTER — Other Ambulatory Visit: Payer: Self-pay | Admitting: Urology

## 2017-10-04 DIAGNOSIS — Z885 Allergy status to narcotic agent status: Secondary | ICD-10-CM | POA: Insufficient documentation

## 2017-10-04 DIAGNOSIS — Z9842 Cataract extraction status, left eye: Secondary | ICD-10-CM | POA: Insufficient documentation

## 2017-10-04 DIAGNOSIS — R300 Dysuria: Secondary | ICD-10-CM | POA: Diagnosis not present

## 2017-10-04 DIAGNOSIS — Z436 Encounter for attention to other artificial openings of urinary tract: Secondary | ICD-10-CM | POA: Diagnosis not present

## 2017-10-04 DIAGNOSIS — N289 Disorder of kidney and ureter, unspecified: Secondary | ICD-10-CM | POA: Diagnosis not present

## 2017-10-04 DIAGNOSIS — Z483 Aftercare following surgery for neoplasm: Secondary | ICD-10-CM | POA: Diagnosis not present

## 2017-10-04 DIAGNOSIS — I739 Peripheral vascular disease, unspecified: Secondary | ICD-10-CM | POA: Insufficient documentation

## 2017-10-04 DIAGNOSIS — E7800 Pure hypercholesterolemia, unspecified: Secondary | ICD-10-CM | POA: Diagnosis not present

## 2017-10-04 DIAGNOSIS — R310 Gross hematuria: Secondary | ICD-10-CM | POA: Insufficient documentation

## 2017-10-04 DIAGNOSIS — Z8551 Personal history of malignant neoplasm of bladder: Secondary | ICD-10-CM | POA: Diagnosis not present

## 2017-10-04 DIAGNOSIS — M1A9XX0 Chronic gout, unspecified, without tophus (tophi): Secondary | ICD-10-CM | POA: Diagnosis not present

## 2017-10-04 DIAGNOSIS — Z9841 Cataract extraction status, right eye: Secondary | ICD-10-CM | POA: Insufficient documentation

## 2017-10-04 DIAGNOSIS — Z8546 Personal history of malignant neoplasm of prostate: Secondary | ICD-10-CM | POA: Diagnosis not present

## 2017-10-04 DIAGNOSIS — X58XXXA Exposure to other specified factors, initial encounter: Secondary | ICD-10-CM | POA: Diagnosis not present

## 2017-10-04 DIAGNOSIS — Z87442 Personal history of urinary calculi: Secondary | ICD-10-CM | POA: Diagnosis not present

## 2017-10-04 DIAGNOSIS — Z86711 Personal history of pulmonary embolism: Secondary | ICD-10-CM | POA: Diagnosis not present

## 2017-10-04 DIAGNOSIS — E119 Type 2 diabetes mellitus without complications: Secondary | ICD-10-CM | POA: Diagnosis not present

## 2017-10-04 DIAGNOSIS — T8141XA Infection following a procedure, superficial incisional surgical site, initial encounter: Secondary | ICD-10-CM | POA: Insufficient documentation

## 2017-10-04 DIAGNOSIS — Z886 Allergy status to analgesic agent status: Secondary | ICD-10-CM | POA: Insufficient documentation

## 2017-10-04 DIAGNOSIS — M1990 Unspecified osteoarthritis, unspecified site: Secondary | ICD-10-CM | POA: Insufficient documentation

## 2017-10-04 DIAGNOSIS — Z9889 Other specified postprocedural states: Secondary | ICD-10-CM | POA: Insufficient documentation

## 2017-10-04 DIAGNOSIS — R351 Nocturia: Secondary | ICD-10-CM | POA: Insufficient documentation

## 2017-10-04 DIAGNOSIS — E1151 Type 2 diabetes mellitus with diabetic peripheral angiopathy without gangrene: Secondary | ICD-10-CM | POA: Diagnosis not present

## 2017-10-04 DIAGNOSIS — C61 Malignant neoplasm of prostate: Secondary | ICD-10-CM | POA: Diagnosis not present

## 2017-10-04 DIAGNOSIS — I252 Old myocardial infarction: Secondary | ICD-10-CM | POA: Insufficient documentation

## 2017-10-04 DIAGNOSIS — I10 Essential (primary) hypertension: Secondary | ICD-10-CM | POA: Diagnosis not present

## 2017-10-04 DIAGNOSIS — Z85118 Personal history of other malignant neoplasm of bronchus and lung: Secondary | ICD-10-CM | POA: Diagnosis not present

## 2017-10-04 DIAGNOSIS — Z86010 Personal history of colonic polyps: Secondary | ICD-10-CM | POA: Insufficient documentation

## 2017-10-04 DIAGNOSIS — N1330 Unspecified hydronephrosis: Secondary | ICD-10-CM | POA: Insufficient documentation

## 2017-10-04 DIAGNOSIS — L02215 Cutaneous abscess of perineum: Secondary | ICD-10-CM | POA: Insufficient documentation

## 2017-10-04 DIAGNOSIS — Z87891 Personal history of nicotine dependence: Secondary | ICD-10-CM | POA: Insufficient documentation

## 2017-10-04 DIAGNOSIS — M069 Rheumatoid arthritis, unspecified: Secondary | ICD-10-CM | POA: Diagnosis not present

## 2017-10-04 DIAGNOSIS — Z79899 Other long term (current) drug therapy: Secondary | ICD-10-CM | POA: Insufficient documentation

## 2017-10-04 DIAGNOSIS — I2510 Atherosclerotic heart disease of native coronary artery without angina pectoris: Secondary | ICD-10-CM | POA: Insufficient documentation

## 2017-10-04 DIAGNOSIS — C675 Malignant neoplasm of bladder neck: Secondary | ICD-10-CM | POA: Diagnosis not present

## 2017-10-04 DIAGNOSIS — B952 Enterococcus as the cause of diseases classified elsewhere: Secondary | ICD-10-CM | POA: Insufficient documentation

## 2017-10-04 HISTORY — PX: INCISION AND DRAINAGE ABSCESS: SHX5864

## 2017-10-04 LAB — GLUCOSE, CAPILLARY: Glucose-Capillary: 104 mg/dL — ABNORMAL HIGH (ref 65–99)

## 2017-10-04 SURGERY — INCISION AND DRAINAGE, ABSCESS
Anesthesia: General

## 2017-10-04 MED ORDER — FENTANYL CITRATE (PF) 100 MCG/2ML IJ SOLN
INTRAMUSCULAR | Status: DC | PRN
  Administered 2017-10-04: 50 ug via INTRAVENOUS

## 2017-10-04 MED ORDER — PROPOFOL 10 MG/ML IV BOLUS
INTRAVENOUS | Status: AC
  Filled 2017-10-04: qty 20

## 2017-10-04 MED ORDER — ONDANSETRON HCL 4 MG/2ML IJ SOLN
INTRAMUSCULAR | Status: DC | PRN
  Administered 2017-10-04: 4 mg via INTRAVENOUS

## 2017-10-04 MED ORDER — TRAMADOL HCL 50 MG PO TABS
50.0000 mg | ORAL_TABLET | Freq: Four times a day (QID) | ORAL | Status: DC | PRN
  Administered 2017-10-04: 50 mg via ORAL
  Filled 2017-10-04 (×2): qty 1

## 2017-10-04 MED ORDER — LACTATED RINGERS IV SOLN
INTRAVENOUS | Status: DC
  Administered 2017-10-04: 12:00:00 via INTRAVENOUS

## 2017-10-04 MED ORDER — CLINDAMYCIN PHOSPHATE 900 MG/50ML IV SOLN
900.0000 mg | INTRAVENOUS | Status: AC
  Administered 2017-10-04: 900 mg via INTRAVENOUS
  Filled 2017-10-04: qty 50

## 2017-10-04 MED ORDER — LIDOCAINE 2% (20 MG/ML) 5 ML SYRINGE
INTRAMUSCULAR | Status: DC | PRN
  Administered 2017-10-04: 60 mg via INTRAVENOUS

## 2017-10-04 MED ORDER — FENTANYL CITRATE (PF) 100 MCG/2ML IJ SOLN
INTRAMUSCULAR | Status: AC
  Filled 2017-10-04: qty 2

## 2017-10-04 MED ORDER — FENTANYL CITRATE (PF) 100 MCG/2ML IJ SOLN: 25.0000 ug | INTRAMUSCULAR | Status: DC | PRN

## 2017-10-04 MED ORDER — 0.9 % SODIUM CHLORIDE (POUR BTL) OPTIME
TOPICAL | Status: DC | PRN
  Administered 2017-10-04: 1000 mL

## 2017-10-04 MED ORDER — EPHEDRINE SULFATE-NACL 50-0.9 MG/10ML-% IV SOSY
PREFILLED_SYRINGE | INTRAVENOUS | Status: DC | PRN
  Administered 2017-10-04: 10 mg via INTRAVENOUS
  Administered 2017-10-04: 15 mg via INTRAVENOUS

## 2017-10-04 MED ORDER — PROPOFOL 10 MG/ML IV BOLUS
INTRAVENOUS | Status: DC | PRN
  Administered 2017-10-04: 150 mg via INTRAVENOUS

## 2017-10-04 MED ORDER — PROMETHAZINE HCL 25 MG/ML IJ SOLN: 6.2500 mg | INTRAMUSCULAR | Status: DC | PRN

## 2017-10-04 MED ORDER — EPHEDRINE 5 MG/ML INJ
INTRAVENOUS | Status: AC
  Filled 2017-10-04: qty 10

## 2017-10-04 SURGICAL SUPPLY — 29 items
ADH SKN CLS APL DERMABOND .7 (GAUZE/BANDAGES/DRESSINGS) ×1
APL SKNCLS STERI-STRIP NONHPOA (GAUZE/BANDAGES/DRESSINGS) ×1
BENZOIN TINCTURE PRP APPL 2/3 (GAUZE/BANDAGES/DRESSINGS) ×3 IMPLANT
BLADE HEX COATED 2.75 (ELECTRODE) ×3 IMPLANT
BLADE SURG 15 STRL LF DISP TIS (BLADE) ×1 IMPLANT
BLADE SURG 15 STRL SS (BLADE) ×3
BNDG GAUZE ELAST 4 BULKY (GAUZE/BANDAGES/DRESSINGS) ×3 IMPLANT
DERMABOND ADVANCED (GAUZE/BANDAGES/DRESSINGS) ×2
DERMABOND ADVANCED .7 DNX12 (GAUZE/BANDAGES/DRESSINGS) ×1 IMPLANT
DRAIN PENROSE 18X1/2 LTX STRL (DRAIN) ×3 IMPLANT
DRAIN PENROSE 18X1/4 LTX STRL (WOUND CARE) ×3 IMPLANT
ELECT PENCIL ROCKER SW 15FT (MISCELLANEOUS) ×3 IMPLANT
ELECT REM PT RETURN 15FT ADLT (MISCELLANEOUS) ×3 IMPLANT
GAUZE PACKING IODOFORM 1/2 (PACKING) ×2 IMPLANT
GAUZE SPONGE 4X4 12PLY STRL (GAUZE/BANDAGES/DRESSINGS) ×3 IMPLANT
GLOVE BIOGEL M STRL SZ7.5 (GLOVE) ×3 IMPLANT
GOWN STRL REUS W/TWL LRG LVL3 (GOWN DISPOSABLE) ×6 IMPLANT
KIT BASIN OR (CUSTOM PROCEDURE TRAY) ×3 IMPLANT
NEEDLE HYPO 22GX1.5 SAFETY (NEEDLE) IMPLANT
NS IRRIG 1000ML POUR BTL (IV SOLUTION) IMPLANT
PACK BASIC VI WITH GOWN DISP (CUSTOM PROCEDURE TRAY) ×3 IMPLANT
SPONGE LAP 4X18 X RAY DECT (DISPOSABLE) ×6 IMPLANT
SUPPORT SCROTAL LG STRP (MISCELLANEOUS) ×2 IMPLANT
SUPPORTER ATHLETIC LG (MISCELLANEOUS) ×1
SUT VIC AB 3-0 SH 27 (SUTURE) ×3
SUT VIC AB 3-0 SH 27XBRD (SUTURE) ×1 IMPLANT
SYR 20CC LL (SYRINGE) ×3 IMPLANT
SYR CONTROL 10ML LL (SYRINGE) IMPLANT
WATER STERILE IRR 1000ML POUR (IV SOLUTION) IMPLANT

## 2017-10-04 NOTE — Anesthesia Procedure Notes (Signed)
Procedure Name: LMA Insertion Performed by: Gean Maidens, CRNA Pre-anesthesia Checklist: Patient identified, Emergency Drugs available, Suction available, Patient being monitored and Timeout performed Patient Re-evaluated:Patient Re-evaluated prior to induction Oxygen Delivery Method: Circle system utilized Preoxygenation: Pre-oxygenation with 100% oxygen Induction Type: IV induction Ventilation: Mask ventilation without difficulty LMA: LMA inserted LMA Size: 4.0 Number of attempts: 1 Placement Confirmation: positive ETCO2,  CO2 detector and breath sounds checked- equal and bilateral Tube secured with: Tape Dental Injury: Teeth and Oropharynx as per pre-operative assessment

## 2017-10-04 NOTE — Anesthesia Preprocedure Evaluation (Signed)
Anesthesia Evaluation  Patient identified by MRN, date of birth, ID band Patient awake    Reviewed: Allergy & Precautions, NPO status , Patient's Chart, lab work & pertinent test results  Airway Mallampati: II  TM Distance: >3 FB Neck ROM: Full    Dental no notable dental hx.    Pulmonary neg pulmonary ROS, former smoker,    Pulmonary exam normal breath sounds clear to auscultation       Cardiovascular hypertension, + CAD and + Peripheral Vascular Disease  Normal cardiovascular exam Rhythm:Regular Rate:Normal     Neuro/Psych negative neurological ROS  negative psych ROS   GI/Hepatic negative GI ROS, Neg liver ROS,   Endo/Other  diabetes, Type 2  Renal/GU negative Renal ROS  negative genitourinary   Musculoskeletal negative musculoskeletal ROS (+)   Abdominal   Peds negative pediatric ROS (+)  Hematology negative hematology ROS (+)   Anesthesia Other Findings   Reproductive/Obstetrics negative OB ROS                            Anesthesia Physical Anesthesia Plan  ASA: III  Anesthesia Plan: General   Post-op Pain Management:    Induction: Intravenous  PONV Risk Score and Plan: 2 and Ondansetron and Treatment may vary due to age or medical condition  Airway Management Planned: LMA  Additional Equipment:   Intra-op Plan:   Post-operative Plan: Extubation in OR  Informed Consent: I have reviewed the patients History and Physical, chart, labs and discussed the procedure including the risks, benefits and alternatives for the proposed anesthesia with the patient or authorized representative who has indicated his/her understanding and acceptance.   Dental advisory given  Plan Discussed with: CRNA and Surgeon  Anesthesia Plan Comments:         Anesthesia Quick Evaluation

## 2017-10-04 NOTE — Transfer of Care (Signed)
Immediate Anesthesia Transfer of Care Note  Patient: Daniel Vega  Procedure(s) Performed: INCISION AND DRAINAGE PERINEAL ABSCESS AND PACKING (N/A )  Patient Location: PACU  Anesthesia Type:General  Level of Consciousness: awake, alert  and oriented  Airway & Oxygen Therapy: Patient Spontanous Breathing and Patient connected to face mask oxygen  Post-op Assessment: Report given to RN and Post -op Vital signs reviewed and stable  Post vital signs: Reviewed and stable  Last Vitals:  Vitals:   10/04/17 1149  BP: 115/67  Pulse: 85  Resp: 18  Temp: 36.8 C  SpO2: 99%    Last Pain:  Vitals:   10/04/17 1253  TempSrc:   PainSc: 3       Patients Stated Pain Goal: 5 (17/47/15 9539)  Complications: No apparent anesthesia complications

## 2017-10-04 NOTE — Discharge Instructions (Signed)
1 - You may have some persistent drainage of fluid from area of prior abscess until completely closed, which may take several weeks. This is normal.  2 - Please continue daily packing of small abscess cavity, removing old packing and placing new clean packing daily.   3 - No restrictions on diet or activity.  4 - Call MD or go to ER for fever >102, severe pain / nausea / vomiting not relieved by medications, or acute change in medical status    General Anesthesia, Adult, Care After These instructions provide you with information about caring for yourself after your procedure. Your health care provider may also give you more specific instructions. Your treatment has been planned according to current medical practices, but problems sometimes occur. Call your health care provider if you have any problems or questions after your procedure. What can I expect after the procedure? After the procedure, it is common to have:  Vomiting.  A sore throat.  Mental slowness.  It is common to feel:  Nauseous.  Cold or shivery.  Sleepy.  Tired.  Sore or achy, even in parts of your body where you did not have surgery.  Follow these instructions at home: For at least 24 hours after the procedure:  Do not: ? Participate in activities where you could fall or become injured. ? Drive. ? Use heavy machinery. ? Drink alcohol. ? Take sleeping pills or medicines that cause drowsiness. ? Make important decisions or sign legal documents. ? Take care of children on your own.  Rest. Eating and drinking  If you vomit, drink water, juice, or soup when you can drink without vomiting.  Drink enough fluid to keep your urine clear or pale yellow.  Make sure you have little or no nausea before eating solid foods.  Follow the diet recommended by your health care provider. General instructions  Have a responsible adult stay with you until you are awake and alert.  Return to your normal activities  as told by your health care provider. Ask your health care provider what activities are safe for you.  Take over-the-counter and prescription medicines only as told by your health care provider.  If you smoke, do not smoke without supervision.  Keep all follow-up visits as told by your health care provider. This is important. Contact a health care provider if:  You continue to have nausea or vomiting at home, and medicines are not helpful.  You cannot drink fluids or start eating again.  You cannot urinate after 8-12 hours.  You develop a skin rash.  You have fever.  You have increasing redness at the site of your procedure. Get help right away if:  You have difficulty breathing.  You have chest pain.  You have unexpected bleeding.  You feel that you are having a life-threatening or urgent problem. This information is not intended to replace advice given to you by your health care provider. Make sure you discuss any questions you have with your health care provider. Document Released: 01/09/2001 Document Revised: 03/07/2016 Document Reviewed: 09/17/2015 Elsevier Interactive Patient Education  [employer].

## 2017-10-04 NOTE — Brief Op Note (Signed)
10/04/2017  2:04 PM  PATIENT:  [name]  76 y.o. male  PRE-OPERATIVE DIAGNOSIS:  Perineal Abscess  POST-OPERATIVE DIAGNOSIS:  Perineal Abscess  PROCEDURE:  Procedure(s): INCISION AND DRAINAGE PERINEAL ABSCESS AND PACKING (N/A)  SURGEON:  Surgeon(s) and Role:    * Alexis Frock, MD - Primary  PHYSICIAN ASSISTANT:   ASSISTANTS: none   ANESTHESIA:   general  EBL:  10 mL   BLOOD ADMINISTERED:none  DRAINS: none   LOCAL MEDICATIONS USED:  NONE  SPECIMEN:  Source of Specimen:  perineal abscess fluid  DISPOSITION OF SPECIMEN:  microbiology for CX  COUNTS:  YES  TOURNIQUET:  * No tourniquets in log *  DICTATION: .Other Dictation: Dictation Number [id number]  PLAN OF CARE: Discharge to home after PACU  PATIENT DISPOSITION:  PACU - hemodynamically stable.   Delay start of Pharmacological VTE agent (>24hrs) due to surgical blood loss or risk of bleeding: yes

## 2017-10-04 NOTE — H&P (Signed)
Daniel Vega is an 76 y.o. male.    Chief Complaint: Pre-OP Incision + Drainage Perineal Abscess  HPI:   1 - Perineal Abscess - new perineal fluid collection at site or prior total urethrectomy about 6 weeks post-op. Initially just seroma, but then progressed to painful purlulent drainage. Has been on Bactrim. No fevers, tachycardia.   Today "Al" is seen to proceed with operative I+D of perineal abscess and confersion of wound to packing type wound.   Past Medical History:  Diagnosis Date  . CAD (coronary artery disease)    CARDIOLOGIST-  DR [place]ique-- hx MI 1994, medically treated;  cath 1997 dRCA occlusion w/ collateral flow  . [facility])    Prostate and bladder  . Chronic gout    as of 05-23-2017 -- per pt stable   . Dysuria   . Elevated ferritin level    followed by dr Alen Blew ([facility])-- per last note differiential dx hx alcohol use versus questionable hemochromatosis  . Full dentures   . Hematuria    chronic  . History of adenomatous polyp of colon   . History of kidney stones    multiple-  calcium oxalate  . History of MI (myocardial infarction)    remote hx 1994-- treated medically  . History of pleural effusion 07/2014   in setting acute blood loss taking xarelto for PE--- s/p  right pleural thoracentesis w/ 600cc  . History of prostate cancer    urologist-  dr Diona Fanti---  primary adenocarcinoma-- Gleason 3+3--  06-27-2008  s/p  radioactive prostate seed implants  . History of pulmonary embolism 07/14/2014   w/  RLL pulmary infarction  . History of pulmonary infarction 07/14/2014   RLL due to PE  . History of recurrent pneumonia   . History of urinary retention    10/ 2015 gross hematuria / clots due to taking xarelto  . Hydronephrosis of right kidney   . Hypercholesteremia   . Hypertension   . Nocturia   . OA (osteoarthritis)    joints  . Personal history of lung cancer 05/1998   adenosquamous left lower lobe primary carcinoma--- s/p left lower  lobectomy 08/ 1999 /  per last CT no recurrence  . RA (rheumatoid arthritis) (HCC)    hands  . Renal insufficiency   . S/P IVC filter placed 07-28-2014  . Type 2 diabetes mellitus ([place])   . Wears glasses     Past Surgical History:  Procedure Laterality Date  . CARDIAC CATHETERIZATION  1997  dr [place]  . CARDIAC CATHETERIZATION  02-11-2011  dr Martinique   (abnormal stress echo) single vessel occlusive artherosclerotic CAD involving the distal RCA w/ excellent collateral flow;  good LVSF, ef 55%  . CATARACT EXTRACTION W/PHACO Left 12/10/2015   Procedure: CATARACT EXTRACTION PHACO AND INTRAOCULAR LENS PLACEMENT LEFT EYE CDE=10.98;  Surgeon: Tonny Branch, MD;  Location: [facility];  Service: Ophthalmology;  Laterality: Left;  . CATARACT EXTRACTION W/PHACO Right 01/07/2016   Procedure: CATARACT EXTRACTION PHACO AND INTRAOCULAR LENS PLACEMENT (IOC);  Surgeon: Tonny Branch, MD;  Location: [facility];  Service: Ophthalmology;  Laterality: Right;  CDE:15.11  . COLONOSCOPY  last one 2016 approx.  . CYSTOSCOPY W/ URETERAL STENT PLACEMENT Bilateral 05/24/2017   Procedure: CYSTOSCOPY WITH RETROGRADE PYELOGRAM/ POSSIBLE URETERAL STENT PLACEMENT;  Surgeon: Franchot Gallo, MD;  Location[facility];  Service: Urology;  Laterality: Bilateral;   [phone] MEDICARE-[id number] A BCBS-[id number]  . CYSTOSCOPY WITH BIOPSY N/A 05/24/2017   Procedure: CYSTOSCOPY WITH BIOPSY;  Surgeon: Franchot Gallo, MD;  Location[facility];  Service: Urology;  Laterality: N/A;  . CYSTOSCOPY WITH INJECTION N/A 08/11/2017   Procedure: CYSTOSCOPY WITH INJECTION OF INDOCYANINE GREEN DYE;  Surgeon: Alexis Frock, MD;  Location: [facility];  Service: Urology;  Laterality: N/A;  . CYSTOSCOPY WITH RETROGRADE PYELOGRAM, URETEROSCOPY AND STENT PLACEMENT Right 07/31/2014   Procedure: CYSTOSCOPY WITH RETROGRADE PYELOGRAM, URETEROSCOPY WITH FULGURATION AND STENT PLACEMENT;  Surgeon: [name], MD;   Location: [facility];  Service: Urology;  Laterality: Right;  . IVC FILTER INSERTION  07/28/2014  . LUMBAR LAMINECTOMY/DECOMPRESSION MICRODISCECTOMY  08/10/2012   Procedure: LUMBAR LAMINECTOMY/DECOMPRESSION MICRODISCECTOMY 1 LEVEL;  Surgeon: Winfield Cunas, MD;  Location: [facility];  Service: Neurosurgery;  Laterality: Left;  LEFT Lumbar five-sacral one microdiskectomy  . LUNG LOBECTOMY Left 06-11-1998   dr Cyndia Bent   [facility]  . RADIOACTIVE PROSTATE SEED IMPLANTS  06-27-2008  dr Diona Fanti  at [facility]  . TESTICLE TORSION REDUCTION Right 1962  . TRANSTHORACIC ECHOCARDIOGRAM  07/15/2014   severe concentrial LVH, ef 24-58%, grade 1 diastolic dysfunction/  mild TR/  PASP 7mmHg  . VEIN LIGATION AND STRIPPING Right     Family History  Problem Relation Age of Onset  . Heart disease Mother   . Hypertension Mother   . Heart disease Father   . Hypertension Father   . Stroke Father   . Other Brother        spinal meningitis  . Other Brother        MVA  . Cancer Sister        stomach   Social History:  reports that he quit smoking about 34 years ago. His smoking use included cigarettes. He has a 70.00 pack-year smoking history. He quit smokeless tobacco use about 5 years ago. His smokeless tobacco use included chew. He reports that he does not drink alcohol or use drugs.  Allergies:  Allergies  Allergen Reactions  . Codeine Palpitations    Made heart hurt  . Ibuprofen Palpitations    Medications Prior to Admission  Medication Sig Dispense Refill  . acetaminophen (TYLENOL) 500 MG tablet Take 1,000 mg by mouth every 6 (six) hours as needed for moderate pain.    Marland Kitchen allopurinol (ZYLOPRIM) 100 MG tablet Take 100 mg by mouth daily after breakfast.     . ALPRAZolam (XANAX) 0.5 MG tablet Take 0.5 mg by mouth 3 (three) times daily as needed for anxiety (anxiety).     Marland Kitchen atorvastatin (LIPITOR) 40 MG tablet Take 1 tablet (40 mg total) by mouth every morning. 90 tablet 3  . finasteride (PROSCAR) 5 MG tablet  Take 5 mg by mouth daily after breakfast.     . folic acid (FOLVITE) 1 MG tablet Take 1 mg by mouth daily.    Marland Kitchen glucosamine-chondroitin 500-400 MG tablet Take 1 tablet by mouth 2 (two) times daily.     . nitroGLYCERIN (NITROSTAT) 0.4 MG SL tablet Place 0.4 mg under the tongue every 5 (five) minutes as needed for chest pain. For chest pain    . traMADol (ULTRAM) 50 MG tablet Take 1-2 tablets (50-100 mg total) every 6 (six) hours as needed by mouth for moderate pain or severe pain. Post-operatively 30 tablet 0  . verapamil (CALAN-SR) 240 MG CR tablet Take 240 mg by mouth 2 (two) times daily.     . vitamin B-12 (CYANOCOBALAMIN) 1000 MCG tablet Take 1,000 mcg by mouth daily.    Marland Kitchen oxybutynin (DITROPAN XL) 10 MG  24 hr tablet Take 1 tablet (10 mg total) by mouth at bedtime. (Patient not taking: Reported on 08/04/2017) 10 tablet 0  . oxybutynin (DITROPAN) 5 MG tablet Take 5 mg by mouth 3 (three) times daily.       No results found for this or any previous visit (from the past 48 hour(s)). No results found.  Review of Systems  Constitutional: Positive for malaise/fatigue. Negative for chills and fever.  HENT: Negative.   Eyes: Negative.   Respiratory: Negative.   Cardiovascular: Negative.   Gastrointestinal: Negative.   Genitourinary: Negative.   Musculoskeletal: Negative.   Skin: Negative.   Neurological: Negative.   Endo/Heme/Allergies: Negative.   Psychiatric/Behavioral: Negative.     Blood pressure 115/67, pulse 85, temperature 98.2 F (36.8 C), temperature source Oral, resp. rate 18, height 5\' 5"  (1.651 m), weight 71.3 kg (157 lb 3.2 oz), SpO2 99 %. Physical Exam  Constitutional: He appears well-developed.  HENT:  Head: Normocephalic.  Neck: Normal range of motion.  Respiratory: Effort normal.  GI: Soft.  Genitourinary:  Genitourinary Comments: RLQ Urostomy pink and patent of copious yellow urine and scant mucus.  Perineal area of drainage about 66mm with about 4cm area of  fluctuence w/o crepitus c/w incompletely drained abscess.   Musculoskeletal: Normal range of motion.  Neurological: He is alert.  Skin: Skin is warm.  Psychiatric: He has a normal mood and affect.     Assessment/Plan  1 - Perineal Abscess - proceed as planned with operative I+D. Risks, benefits, alternatives, expected peri-op course with need for daily packing changes discussed. Will obtain additional CX in OR today, may adjust home ABX pending results.   Alexis Frock, MD 10/04/2017, 12:56 PM

## 2017-10-05 NOTE — Anesthesia Postprocedure Evaluation (Signed)
Anesthesia Post Note  Patient: Daniel Vega  Procedure(s) Performed: INCISION AND DRAINAGE PERINEAL ABSCESS AND PACKING (N/A )     Patient location during evaluation: PACU Anesthesia Type: General Level of consciousness: awake and alert Pain management: pain level controlled Vital Signs Assessment: post-procedure vital signs reviewed and stable Respiratory status: spontaneous breathing, nonlabored ventilation, respiratory function stable and patient connected to nasal cannula oxygen Cardiovascular status: blood pressure returned to baseline and stable Postop Assessment: no apparent nausea or vomiting Anesthetic complications: no    Last Vitals:  Vitals:   10/04/17 1516 10/04/17 1558  BP: 122/67 (!) 121/59  Pulse: 64 78  Resp: 16 18  Temp: (!) 36.3 C 36.4 C  SpO2: 100% 100%    Last Pain:  Vitals:   10/05/17 0949  TempSrc:   PainSc: 3                  Shanisha Lech S

## 2017-10-05 NOTE — Op Note (Signed)
NAME:  [name], [name]:  MEDICAL RECORD NO.:  [id number]  LOCATION:                                 FACILITY:  PHYSICIAN:  Alexis Frock, MD     DATE OF BIRTH:  08/13/1941  DATE OF PROCEDURE: 10/04/2017                               OPERATIVE REPORT   DIAGNOSIS:  Perineal abscess.  PROCEDURE:  Incision and drainage of perineal abscess.  ESTIMATED BLOOD LOSS:  Nil.  COMPLICATION:  None.  SPECIMEN:  Abscess fluid for Gram stain and culture.  COMPLICATIONS:  None.  FINDINGS: 1. Relatively small-volume isolated perineal abscess mostly     subcutaneous, approximate volume 2 cm2.  There was no tracking of     this towards the rectum, urethra, or any tissue deeper than subcu,     this was all closed at the level of his perineal musculature. 2. Successful conversion of wound to open packed wound.  INDICATIONS:  [name] is a pleasant 76 year old gentleman with recent history of cystoprostatectomy with total urethrectomy for locally- advanced bladder cancer.  He has had a relatively uneventful postoperative course.  However, he developed some new perineal pain and originally serous drainage several weeks ago.  He was placed on oral antibiotics for this; however, his drainage became more purulent and he had increased pain.  He was seen in the office yesterday, and this is concerning for a localized perineal abscess.  He is not febrile or tachycardic.  There were no crepitus, signs of any necrotizing fasciitis.  Given the location and his recent operative history as well as prior history of radiation, it was felt that operative investigation with incision and drainage in the OR would be most advantageous and informed consent was obtained and placed in the medical record.  PROCEDURE IN DETAIL:  The patient being Daniel Vega, was verified. Procedure being incision and drainage of perineal abscess was confirmed. Procedure was carried out.  Time-out  was performed.  Intravenous antibiotics were administered.  General LMA anesthesia was introduced. The patient was placed into a medium lithotomy position.  Sterile field was created by prepping and draping the patient's penis, perineum, and proximal thighs using iodine.  The area of small opening in the perineum was identified.  There was a significant reduction in the amount of fluctuance having expressed.  There was significant amount of fluid in the office yesterday.  There was also a somewhat reduction in the amount of induration as well.  There was no crepitus, no tracking towards his scrotal contents or rectum.  Incision was made approximately 3 cm in length from this area inferiorly along the area of perineum along the area of prior fluctuance and induration.  This was carried out through the deep subcutaneous tissue, but not deep to his perineal musculature. A small abscess cavity was entered.  There was a scant amount of residual purulent fluid.  Culture swabs were taken for aerobic and anaerobic culture and Gram stain.  The area was probed with surgeons fingers.  There again was only about a 2 cm x 2 cm subcutaneous abscess cavity, this was all beneath the area of the pelvic  musculature, this did not obviously tract towards his scrotum or towards his rectum.  This appeared to be purely subcutaneous process fortunately.  The area was copiously irrigated with saline.  The inferior aspect of this was reapproximated for about half of its length using interrupted Prolene. Such that, approximately 1.5 cm area of incision remained, which was about the length of the abscess cavity.  This was then packed with Iodoform gauze leaving approximately 3 cm tail and procedure was terminated.  The patient tolerated the procedure well.  There were no immediate periprocedural complications.  The patient was taken to the postanesthesia care in stable condition with plan for discharge home today.   He has family, which is very sessile with similar wound management and should be able to help him perform daily packing.          ______________________________ Alexis Frock, MD     TM/MEDQ  D:  10/04/2017  T:  10/05/2017  Job:  [id number]

## 2017-10-10 LAB — AEROBIC/ANAEROBIC CULTURE W GRAM STAIN (SURGICAL/DEEP WOUND)

## 2017-10-10 LAB — AEROBIC/ANAEROBIC CULTURE (SURGICAL/DEEP WOUND)

## 2017-10-12 DIAGNOSIS — I252 Old myocardial infarction: Secondary | ICD-10-CM | POA: Diagnosis not present

## 2017-10-12 DIAGNOSIS — C61 Malignant neoplasm of prostate: Secondary | ICD-10-CM | POA: Diagnosis not present

## 2017-10-12 DIAGNOSIS — C675 Malignant neoplasm of bladder neck: Secondary | ICD-10-CM | POA: Diagnosis not present

## 2017-10-12 DIAGNOSIS — Z483 Aftercare following surgery for neoplasm: Secondary | ICD-10-CM | POA: Diagnosis not present

## 2017-10-12 DIAGNOSIS — Z436 Encounter for attention to other artificial openings of urinary tract: Secondary | ICD-10-CM | POA: Diagnosis not present

## 2017-10-12 DIAGNOSIS — I2510 Atherosclerotic heart disease of native coronary artery without angina pectoris: Secondary | ICD-10-CM | POA: Diagnosis not present

## 2017-10-16 DIAGNOSIS — I252 Old myocardial infarction: Secondary | ICD-10-CM | POA: Diagnosis not present

## 2017-10-16 DIAGNOSIS — Z483 Aftercare following surgery for neoplasm: Secondary | ICD-10-CM | POA: Diagnosis not present

## 2017-10-16 DIAGNOSIS — C675 Malignant neoplasm of bladder neck: Secondary | ICD-10-CM | POA: Diagnosis not present

## 2017-10-16 DIAGNOSIS — Z436 Encounter for attention to other artificial openings of urinary tract: Secondary | ICD-10-CM | POA: Diagnosis not present

## 2017-10-16 DIAGNOSIS — I2510 Atherosclerotic heart disease of native coronary artery without angina pectoris: Secondary | ICD-10-CM | POA: Diagnosis not present

## 2017-10-16 DIAGNOSIS — C61 Malignant neoplasm of prostate: Secondary | ICD-10-CM | POA: Diagnosis not present

## 2017-10-18 DIAGNOSIS — C675 Malignant neoplasm of bladder neck: Secondary | ICD-10-CM | POA: Diagnosis not present

## 2017-10-18 DIAGNOSIS — Z436 Encounter for attention to other artificial openings of urinary tract: Secondary | ICD-10-CM | POA: Diagnosis not present

## 2017-10-18 DIAGNOSIS — Z483 Aftercare following surgery for neoplasm: Secondary | ICD-10-CM | POA: Diagnosis not present

## 2017-10-18 DIAGNOSIS — I2510 Atherosclerotic heart disease of native coronary artery without angina pectoris: Secondary | ICD-10-CM | POA: Diagnosis not present

## 2017-10-18 DIAGNOSIS — I252 Old myocardial infarction: Secondary | ICD-10-CM | POA: Diagnosis not present

## 2017-10-18 DIAGNOSIS — C61 Malignant neoplasm of prostate: Secondary | ICD-10-CM | POA: Diagnosis not present

## 2017-10-20 ENCOUNTER — Encounter: Payer: Self-pay | Admitting: *Deleted

## 2017-10-20 DIAGNOSIS — Z483 Aftercare following surgery for neoplasm: Secondary | ICD-10-CM | POA: Diagnosis not present

## 2017-10-20 DIAGNOSIS — I2510 Atherosclerotic heart disease of native coronary artery without angina pectoris: Secondary | ICD-10-CM | POA: Diagnosis not present

## 2017-10-20 DIAGNOSIS — C61 Malignant neoplasm of prostate: Secondary | ICD-10-CM | POA: Diagnosis not present

## 2017-10-20 DIAGNOSIS — Z436 Encounter for attention to other artificial openings of urinary tract: Secondary | ICD-10-CM | POA: Diagnosis not present

## 2017-10-20 DIAGNOSIS — I252 Old myocardial infarction: Secondary | ICD-10-CM | POA: Diagnosis not present

## 2017-10-20 DIAGNOSIS — C675 Malignant neoplasm of bladder neck: Secondary | ICD-10-CM | POA: Diagnosis not present

## 2017-10-22 DIAGNOSIS — Z86718 Personal history of other venous thrombosis and embolism: Secondary | ICD-10-CM | POA: Diagnosis not present

## 2017-10-22 DIAGNOSIS — Z87891 Personal history of nicotine dependence: Secondary | ICD-10-CM | POA: Diagnosis not present

## 2017-10-22 DIAGNOSIS — I10 Essential (primary) hypertension: Secondary | ICD-10-CM | POA: Diagnosis not present

## 2017-10-22 DIAGNOSIS — C675 Malignant neoplasm of bladder neck: Secondary | ICD-10-CM | POA: Diagnosis not present

## 2017-10-22 DIAGNOSIS — M069 Rheumatoid arthritis, unspecified: Secondary | ICD-10-CM | POA: Diagnosis not present

## 2017-10-22 DIAGNOSIS — Z436 Encounter for attention to other artificial openings of urinary tract: Secondary | ICD-10-CM | POA: Diagnosis not present

## 2017-10-22 DIAGNOSIS — I2510 Atherosclerotic heart disease of native coronary artery without angina pectoris: Secondary | ICD-10-CM | POA: Diagnosis not present

## 2017-10-22 DIAGNOSIS — Z9079 Acquired absence of other genital organ(s): Secondary | ICD-10-CM | POA: Diagnosis not present

## 2017-10-22 DIAGNOSIS — I252 Old myocardial infarction: Secondary | ICD-10-CM | POA: Diagnosis not present

## 2017-10-22 DIAGNOSIS — Z86711 Personal history of pulmonary embolism: Secondary | ICD-10-CM | POA: Diagnosis not present

## 2017-10-22 DIAGNOSIS — Z960 Presence of urogenital implants: Secondary | ICD-10-CM | POA: Diagnosis not present

## 2017-10-22 DIAGNOSIS — C61 Malignant neoplasm of prostate: Secondary | ICD-10-CM | POA: Diagnosis not present

## 2017-10-22 DIAGNOSIS — Z906 Acquired absence of other parts of urinary tract: Secondary | ICD-10-CM | POA: Diagnosis not present

## 2017-10-22 DIAGNOSIS — Z8701 Personal history of pneumonia (recurrent): Secondary | ICD-10-CM | POA: Diagnosis not present

## 2017-10-22 DIAGNOSIS — Z85118 Personal history of other malignant neoplasm of bronchus and lung: Secondary | ICD-10-CM | POA: Diagnosis not present

## 2017-10-22 DIAGNOSIS — Z95828 Presence of other vascular implants and grafts: Secondary | ICD-10-CM | POA: Diagnosis not present

## 2017-10-22 DIAGNOSIS — M1A9XX0 Chronic gout, unspecified, without tophus (tophi): Secondary | ICD-10-CM | POA: Diagnosis not present

## 2017-10-22 DIAGNOSIS — Z483 Aftercare following surgery for neoplasm: Secondary | ICD-10-CM | POA: Diagnosis not present

## 2017-10-25 DIAGNOSIS — I252 Old myocardial infarction: Secondary | ICD-10-CM | POA: Diagnosis not present

## 2017-10-25 DIAGNOSIS — C61 Malignant neoplasm of prostate: Secondary | ICD-10-CM | POA: Diagnosis not present

## 2017-10-25 DIAGNOSIS — C675 Malignant neoplasm of bladder neck: Secondary | ICD-10-CM | POA: Diagnosis not present

## 2017-10-25 DIAGNOSIS — Z483 Aftercare following surgery for neoplasm: Secondary | ICD-10-CM | POA: Diagnosis not present

## 2017-10-25 DIAGNOSIS — I2510 Atherosclerotic heart disease of native coronary artery without angina pectoris: Secondary | ICD-10-CM | POA: Diagnosis not present

## 2017-10-25 DIAGNOSIS — Z436 Encounter for attention to other artificial openings of urinary tract: Secondary | ICD-10-CM | POA: Diagnosis not present

## 2017-10-31 DIAGNOSIS — Z483 Aftercare following surgery for neoplasm: Secondary | ICD-10-CM | POA: Diagnosis not present

## 2017-10-31 DIAGNOSIS — Z436 Encounter for attention to other artificial openings of urinary tract: Secondary | ICD-10-CM | POA: Diagnosis not present

## 2017-10-31 DIAGNOSIS — C675 Malignant neoplasm of bladder neck: Secondary | ICD-10-CM | POA: Diagnosis not present

## 2017-10-31 DIAGNOSIS — I2510 Atherosclerotic heart disease of native coronary artery without angina pectoris: Secondary | ICD-10-CM | POA: Diagnosis not present

## 2017-10-31 DIAGNOSIS — C61 Malignant neoplasm of prostate: Secondary | ICD-10-CM | POA: Diagnosis not present

## 2017-10-31 DIAGNOSIS — I252 Old myocardial infarction: Secondary | ICD-10-CM | POA: Diagnosis not present

## 2017-11-06 DIAGNOSIS — I252 Old myocardial infarction: Secondary | ICD-10-CM | POA: Diagnosis not present

## 2017-11-06 DIAGNOSIS — C675 Malignant neoplasm of bladder neck: Secondary | ICD-10-CM | POA: Diagnosis not present

## 2017-11-06 DIAGNOSIS — Z483 Aftercare following surgery for neoplasm: Secondary | ICD-10-CM | POA: Diagnosis not present

## 2017-11-06 DIAGNOSIS — C61 Malignant neoplasm of prostate: Secondary | ICD-10-CM | POA: Diagnosis not present

## 2017-11-06 DIAGNOSIS — I2510 Atherosclerotic heart disease of native coronary artery without angina pectoris: Secondary | ICD-10-CM | POA: Diagnosis not present

## 2017-11-06 DIAGNOSIS — Z436 Encounter for attention to other artificial openings of urinary tract: Secondary | ICD-10-CM | POA: Diagnosis not present

## 2017-11-13 DIAGNOSIS — Z483 Aftercare following surgery for neoplasm: Secondary | ICD-10-CM | POA: Diagnosis not present

## 2017-11-13 DIAGNOSIS — Z436 Encounter for attention to other artificial openings of urinary tract: Secondary | ICD-10-CM | POA: Diagnosis not present

## 2017-11-13 DIAGNOSIS — I2510 Atherosclerotic heart disease of native coronary artery without angina pectoris: Secondary | ICD-10-CM | POA: Diagnosis not present

## 2017-11-13 DIAGNOSIS — I252 Old myocardial infarction: Secondary | ICD-10-CM | POA: Diagnosis not present

## 2017-11-13 DIAGNOSIS — C61 Malignant neoplasm of prostate: Secondary | ICD-10-CM | POA: Diagnosis not present

## 2017-11-13 DIAGNOSIS — C675 Malignant neoplasm of bladder neck: Secondary | ICD-10-CM | POA: Diagnosis not present

## 2017-11-16 ENCOUNTER — Inpatient Hospital Stay: Payer: Medicare Other | Attending: Oncology | Admitting: Oncology

## 2017-11-16 ENCOUNTER — Telehealth: Payer: Self-pay | Admitting: Oncology

## 2017-11-16 VITALS — BP 104/57 | HR 97 | Temp 97.5°F | Resp 17 | Ht 65.0 in | Wt 159.7 lb

## 2017-11-16 DIAGNOSIS — C679 Malignant neoplasm of bladder, unspecified: Secondary | ICD-10-CM

## 2017-11-16 DIAGNOSIS — Z86711 Personal history of pulmonary embolism: Secondary | ICD-10-CM | POA: Diagnosis not present

## 2017-11-16 DIAGNOSIS — G8929 Other chronic pain: Secondary | ICD-10-CM | POA: Diagnosis not present

## 2017-11-16 DIAGNOSIS — K610 Anal abscess: Secondary | ICD-10-CM | POA: Insufficient documentation

## 2017-11-16 DIAGNOSIS — Z8546 Personal history of malignant neoplasm of prostate: Secondary | ICD-10-CM | POA: Insufficient documentation

## 2017-11-16 DIAGNOSIS — Z923 Personal history of irradiation: Secondary | ICD-10-CM | POA: Diagnosis not present

## 2017-11-16 DIAGNOSIS — N289 Disorder of kidney and ureter, unspecified: Secondary | ICD-10-CM

## 2017-11-16 DIAGNOSIS — C678 Malignant neoplasm of overlapping sites of bladder: Secondary | ICD-10-CM

## 2017-11-16 NOTE — Telephone Encounter (Signed)
Patient scheduled for apppointment/ Contrast given w/ instructions/ AVS/Calendar printed

## 2017-11-16 NOTE — Progress Notes (Signed)
Hematology and Oncology Follow Up Visit  Daniel Vega [id number] [birth year] 77 y.o. 11/16/2017 10:44 AM [name], MDGates, [name], MD   Principle Diagnosis: 76 year old man with high-grade urothelial carcinoma of the bladder diagnosed in July 2018.  The final pathology revealed 30% squamous differentiation as well as 30% glandular component.    Prior Therapy:  Daniel Vega is status post robotic assisted laparoscopic radical cystoprostatectomy and bilateral lymphadenectomy completed by Dr. Tresa Moore completed on 08/11/2017.  The final code [id number] with high-grade urothelial carcinoma with squamous and glandular differentiation.  Current therapy: Supportive care only.  Interim History: Daniel Vega is here for a follow-up.  Daniel Vega has been doing poorly since the last visit.  Daniel Vega developed a perianal abscess that was drained on 10/04/2017.  Since that time Daniel Vega developed a chronic pain in the perianal area and at the base of the penis.  His pain is rather constant and has been taking tramadol and Tylenol in alternating doses.  Pain continues to be an issue despite these pain medication.  Daniel Vega also completed a course of antibiotics that resulted in loose bowel movements although Daniel Vega is bowel movements are close to normal now.  Daniel Vega is quite debilitated from all of these symptoms and of lost some weight.  Daniel Vega is still ambulating with the help of a cane although his appetite is declining.  Daniel Vega denies any other pain including abdominal pain or back pain.  Daniel Vega does not report any headaches, blurry vision, syncope or seizures. Daniel Vega does not report any fevers, chills or sweats. Daniel Vega does not report any cough, wheezing or hemoptysis. Daniel Vega does not report any nausea, vomiting or abdominal pain. Daniel Vega does not report any constipation or diarrhea.  Daniel Vega does not report any skeletal complaints. Daniel Vega does not report any arthralgias or myalgias. Daniel Vega does not report any lymphadenopathy or petechiae.   Remaining review of system is  negative.   Medications: I have reviewed the patient's current medications.  Current Outpatient Medications  Medication Sig Dispense Refill  . acetaminophen (TYLENOL) 500 MG tablet Take 1,000 mg by mouth every 6 (six) hours as needed for moderate pain.    Marland Kitchen allopurinol (ZYLOPRIM) 100 MG tablet Take 100 mg by mouth daily after breakfast.     . ALPRAZolam (XANAX) 0.5 MG tablet Take 0.5 mg by mouth 3 (three) times daily as needed for anxiety (anxiety).     Marland Kitchen atorvastatin (LIPITOR) 40 MG tablet Take 1 tablet (40 mg total) by mouth every morning. 90 tablet 3  . folic acid (FOLVITE) 1 MG tablet Take 1 mg by mouth daily.    Marland Kitchen glucosamine-chondroitin 500-400 MG tablet Take 1 tablet by mouth 2 (two) times daily.     . nitroGLYCERIN (NITROSTAT) 0.4 MG SL tablet Place 0.4 mg under the tongue every 5 (five) minutes as needed for chest pain. For chest pain    . traMADol (ULTRAM) 50 MG tablet Take 1-2 tablets (50-100 mg total) every 6 (six) hours as needed by mouth for moderate pain or severe pain. Post-operatively 30 tablet 0  . verapamil (CALAN-SR) 240 MG CR tablet Take 240 mg by mouth 2 (two) times daily.     . vitamin B-12 (CYANOCOBALAMIN) 1000 MCG tablet Take 1,000 mcg by mouth daily.     No current facility-administered medications for this visit.      Allergies:  Allergies  Allergen Reactions  . Codeine Palpitations    Made heart hurt  . Ibuprofen Palpitations    Past Medical History, Surgical history, Social  history, and Family History were reviewed and updated.  Physical Exam: Blood pressure (!) 104/57, pulse 97, temperature (!) 97.5 F (36.4 C), temperature source Oral, resp. rate 17, height 5\' 5"  (1.651 m), weight 159 lb 11.2 oz (72.4 kg), SpO2 99 %. ECOG: 1 General appearance: alert and cooperative appeared without distress. Head: Normocephalic, without obvious abnormality  Oral mucosa: Without thrush or ulcers. Eyes: No scleral icterus. Lymph nodes: Cervical, supraclavicular,  and axillary nodes normal. Heart:regular rate and rhythm, S1, S2 normal, no murmur, click, rub or gallop Lung: Clear without rhonchi or wheezes in all lung fields. Abdomin: soft, non-tender, without masses or organomegaly.  No rebound or guarding. Musculoskeletal: No joint deformity or effusion.   Lab Results: Lab Results  Component Value Date   WBC 13.9 (H) 09/20/2017   HGB 8.2 (L) 09/20/2017   HCT 25.2 (L) 09/20/2017   MCV 92.3 09/20/2017   PLT 253 09/20/2017     Chemistry      Component Value Date/Time   NA 133 (L) 09/20/2017 2304   NA 140 10/25/2013 0955   K 3.6 09/20/2017 2304   K 4.5 10/25/2013 0955   CL 101 09/20/2017 2304   CL 108 (H) 01/24/2013 1518   CO2 24 09/20/2017 2304   CO2 23 10/25/2013 0955   BUN 20 09/20/2017 2304   BUN 19.7 10/25/2013 0955   CREATININE 0.83 09/20/2017 2304   CREATININE 1.0 10/25/2013 0955      Component Value Date/Time   CALCIUM 8.4 (L) 09/20/2017 2304   CALCIUM 9.3 10/25/2013 0955   ALKPHOS 55 08/10/2017 1148   ALKPHOS 75 10/25/2013 0955   AST 14 (L) 08/10/2017 1148   AST 20 10/25/2013 0955   ALT 9 (L) 08/10/2017 1148   ALT 21 10/25/2013 0955   BILITOT 0.6 08/10/2017 1148   BILITOT 0.96 10/25/2013 0955       Impression and Plan:  77 year old gentleman with the following issues:  1.  T4 N0 high-grade urothelial carcinoma  of the bladder diagnosed in July 2018.   Daniel Vega is status post cystoprostatectomy with a final pathology showed urothelial carcinoma with glandular differentiation as well as squamous differentiation.  His operation completed on 08/11/2017.  Daniel Vega continues to recover poorly from his operation with a recent development of perianal abscess.  His performance status is marginal and not a candidate for any additional therapy.  Daniel Vega is developing worsening pelvic pain and locally recurrent disease would be a possibility.  I will obtain imaging studies of the abdomen and pelvis urgently to evaluate him for locally  advanced disease.  His treatment options may be limited which could include palliative chemotherapy if Daniel Vega has advanced disease versus supportive care only.   2. Prostate cancer: Daniel Vega received radiation therapy in the form of seed implants in 2009.  Daniel Vega has no evidence of recurrence at this time..  3. History of pulmonary embolism: No recent bleeding or thrombosis.  Daniel Vega has IVC filter in place.  4. Renal insufficiency: His creatinine in December 2018 was 0.83 with a normal creatinine clearance.  5.  Pelvic pain: Related to his previous surgery and recent abscess.  Locally advanced disease is also a possibility.  Tramadol and Tylenol alternating has been marginally effective in controlling his pain.  I urged him to follow-up with Dr. Tresa Moore regarding this issue.  6. Follow-up: Will be in the next 3 months sooner if his scan showed any abnormalities.  15  minutes was spent with the patient face-to-face today.  More than  50% of time was dedicated to patient counseling, education and coordination of his care.    Zola Button, MD 1/31/201910:44 AM

## 2017-11-20 ENCOUNTER — Other Ambulatory Visit: Payer: Self-pay | Admitting: *Deleted

## 2017-11-20 DIAGNOSIS — C61 Malignant neoplasm of prostate: Secondary | ICD-10-CM

## 2017-11-20 DIAGNOSIS — C679 Malignant neoplasm of bladder, unspecified: Secondary | ICD-10-CM

## 2017-11-20 MED ORDER — HYDROCODONE-ACETAMINOPHEN 5-325 MG PO TABS: 1.0000 | ORAL_TABLET | Freq: Four times a day (QID) | ORAL | 0 refills | Status: AC | PRN

## 2017-11-22 ENCOUNTER — Other Ambulatory Visit: Payer: Self-pay | Admitting: *Deleted

## 2017-11-22 DIAGNOSIS — C675 Malignant neoplasm of bladder neck: Secondary | ICD-10-CM | POA: Diagnosis not present

## 2017-11-22 DIAGNOSIS — I2510 Atherosclerotic heart disease of native coronary artery without angina pectoris: Secondary | ICD-10-CM | POA: Diagnosis not present

## 2017-11-22 DIAGNOSIS — I252 Old myocardial infarction: Secondary | ICD-10-CM | POA: Diagnosis not present

## 2017-11-22 DIAGNOSIS — Z436 Encounter for attention to other artificial openings of urinary tract: Secondary | ICD-10-CM | POA: Diagnosis not present

## 2017-11-22 DIAGNOSIS — C61 Malignant neoplasm of prostate: Secondary | ICD-10-CM | POA: Diagnosis not present

## 2017-11-22 DIAGNOSIS — Z483 Aftercare following surgery for neoplasm: Secondary | ICD-10-CM | POA: Diagnosis not present

## 2017-11-23 ENCOUNTER — Ambulatory Visit (HOSPITAL_COMMUNITY)
Admission: RE | Admit: 2017-11-23 | Discharge: 2017-11-23 | Disposition: A | Discharge status: Home / Self Care | Payer: Medicare Other | Source: Ambulatory Visit | Attending: Oncology | Admitting: Oncology

## 2017-11-23 ENCOUNTER — Telehealth: Payer: Self-pay | Admitting: Oncology

## 2017-11-23 DIAGNOSIS — C678 Malignant neoplasm of overlapping sites of bladder: Secondary | ICD-10-CM | POA: Diagnosis not present

## 2017-11-23 DIAGNOSIS — I2510 Atherosclerotic heart disease of native coronary artery without angina pectoris: Secondary | ICD-10-CM | POA: Diagnosis not present

## 2017-11-23 DIAGNOSIS — D4989 Neoplasm of unspecified behavior of other specified sites: Secondary | ICD-10-CM | POA: Insufficient documentation

## 2017-11-23 DIAGNOSIS — N1330 Unspecified hydronephrosis: Secondary | ICD-10-CM | POA: Insufficient documentation

## 2017-11-23 DIAGNOSIS — Z9889 Other specified postprocedural states: Secondary | ICD-10-CM | POA: Diagnosis not present

## 2017-11-23 DIAGNOSIS — K769 Liver disease, unspecified: Secondary | ICD-10-CM | POA: Diagnosis not present

## 2017-11-23 DIAGNOSIS — I700 Atherosclerosis of aorta: Secondary | ICD-10-CM | POA: Diagnosis not present

## 2017-11-23 DIAGNOSIS — J479 Bronchiectasis, uncomplicated: Secondary | ICD-10-CM | POA: Diagnosis not present

## 2017-11-23 DIAGNOSIS — R918 Other nonspecific abnormal finding of lung field: Secondary | ICD-10-CM | POA: Insufficient documentation

## 2017-11-23 LAB — POCT I-STAT CREATININE: CREATININE: 0.8 mg/dL (ref 0.61–1.24)

## 2017-11-23 MED ORDER — IOPAMIDOL (ISOVUE-300) INJECTION 61%
INTRAVENOUS | Status: AC
  Filled 2017-11-23: qty 150

## 2017-11-23 MED ORDER — IOPAMIDOL (ISOVUE-300) INJECTION 61%
125.0000 mL | Freq: Once | INTRAVENOUS | Status: AC | PRN
  Administered 2017-11-23: 125 mL via INTRAVENOUS

## 2017-11-23 MED ORDER — SODIUM CHLORIDE 0.9 % IV SOLN
INTRAVENOUS | Status: AC
  Filled 2017-11-23: qty 250

## 2017-11-23 NOTE — Telephone Encounter (Signed)
Scheduled appt per 2/7 sch msg -spoke with patient regarding appts.

## 2017-11-27 ENCOUNTER — Inpatient Hospital Stay: Payer: Medicare Other | Attending: Oncology | Admitting: Oncology

## 2017-11-27 ENCOUNTER — Telehealth: Payer: Self-pay | Admitting: Oncology

## 2017-11-27 VITALS — BP 113/65 | HR 87 | Temp 97.8°F | Resp 24 | Ht 65.0 in | Wt 154.6 lb

## 2017-11-27 DIAGNOSIS — R102 Pelvic and perineal pain: Secondary | ICD-10-CM

## 2017-11-27 DIAGNOSIS — Z86711 Personal history of pulmonary embolism: Secondary | ICD-10-CM

## 2017-11-27 DIAGNOSIS — N289 Disorder of kidney and ureter, unspecified: Secondary | ICD-10-CM

## 2017-11-27 DIAGNOSIS — C679 Malignant neoplasm of bladder, unspecified: Secondary | ICD-10-CM | POA: Diagnosis not present

## 2017-11-27 DIAGNOSIS — Z5111 Encounter for antineoplastic chemotherapy: Secondary | ICD-10-CM | POA: Diagnosis not present

## 2017-11-27 MED ORDER — LIDOCAINE-PRILOCAINE 2.5-2.5 % EX CREA: 1.0000 "application " | TOPICAL_CREAM | CUTANEOUS | 0 refills | Status: AC | PRN

## 2017-11-27 MED ORDER — PROCHLORPERAZINE MALEATE 10 MG PO TABS: 10.0000 mg | ORAL_TABLET | Freq: Four times a day (QID) | ORAL | 0 refills | Status: AC | PRN

## 2017-11-27 MED ORDER — OXYCODONE HCL 5 MG PO TABS: 5.0000 mg | ORAL_TABLET | ORAL | 0 refills | Status: DC | PRN

## 2017-11-27 NOTE — Progress Notes (Signed)
Hematology and Oncology Follow Up Visit  Daniel Vega [id number] 1941-07-02 77 y.o. 11/27/2017 11:32 AM Daniel Vega, MDGates, [name], MD   Principle Diagnosis: 77 year old man with advanced high-grade urothelial carcinoma of the bladder diagnosed in July 2018.  He has documented local metastasis in February 2019.     Prior Therapy:  He is status post robotic assisted laparoscopic radical cystoprostatectomy and bilateral lymphadenectomy completed by Dr. Tresa Moore completed on 08/11/2017.  The final code [id number] with high-grade urothelial carcinoma with squamous and glandular differentiation.  Current therapy: Under consideration to start systemic therapy.  Interim History: Daniel Vega presents today for a follow-up with his family.  Since the last visit, he has been prescribed hydrocodone to help with his pain with some improvement in his symptoms.  His pain is rather sharp and graded close to 8 out of 10 and is predominantly at the bottom of the penis.  Status associated with any neurological deficits and he reports hydrocodone alleviate the pain for a few hours although does not last longer.  He continues to be quite debilitated and have lost some weight.  Is not sleeping properly and his performance status is declining.  He continues to have loose bowel habits and poor control of his bowel movements.   He does not report any headaches, blurry vision, syncope or seizures. He does not report any fevers, chills or sweats. He does not report any cough, wheezing or hemoptysis. He does not report any nausea, vomiting or abdominal pain. He does not report any constipation or diarrhea.  He does not report any skeletal complaints. He does not report any arthralgias or myalgias. He does not report any lymphadenopathy or petechiae.   He does not report any skin rashes or lesions.  He does not report any heat or cold intolerance.  He does not report any anxiety or depression.  Remaining review of system is  negative.   Medications: I have reviewed the patient's current medications.  Current Outpatient Medications  Medication Sig Dispense Refill  . acetaminophen (TYLENOL) 500 MG tablet Take 1,000 mg by mouth every 6 (six) hours as needed for moderate pain.    Marland Kitchen allopurinol (ZYLOPRIM) 100 MG tablet Take 100 mg by mouth daily after breakfast.     . ALPRAZolam (XANAX) 0.5 MG tablet Take 0.5 mg by mouth 3 (three) times daily as needed for anxiety (anxiety).     Marland Kitchen atorvastatin (LIPITOR) 40 MG tablet Take 1 tablet (40 mg total) by mouth every morning. 90 tablet 3  . folic acid (FOLVITE) 1 MG tablet Take 1 mg by mouth daily.    Marland Kitchen glucosamine-chondroitin 500-400 MG tablet Take 1 tablet by mouth 2 (two) times daily.     Marland Kitchen HYDROcodone-acetaminophen (NORCO/VICODIN) 5-325 MG tablet Take 1 tablet by mouth every 6 (six) hours as needed for moderate pain. 40 tablet 0  . lidocaine-prilocaine (EMLA) cream Apply 1 application topically as needed. 30 g 0  . nitroGLYCERIN (NITROSTAT) 0.4 MG SL tablet Place 0.4 mg under the tongue every 5 (five) minutes as needed for chest pain. For chest pain    . oxyCODONE (OXY IR/ROXICODONE) 5 MG immediate release tablet Take 1 tablet (5 mg total) by mouth every 4 (four) hours as needed for severe pain. 90 tablet 0  . prochlorperazine (COMPAZINE) 10 MG tablet Take 1 tablet (10 mg total) by mouth every 6 (six) hours as needed for nausea or vomiting. 30 tablet 0  . traMADol (ULTRAM) 50 MG tablet Take 1-2 tablets (50-100  mg total) every 6 (six) hours as needed by mouth for moderate pain or severe pain. Post-operatively 30 tablet 0  . verapamil (CALAN-SR) 240 MG CR tablet Take 240 mg by mouth 2 (two) times daily.     . vitamin B-12 (CYANOCOBALAMIN) 1000 MCG tablet Take 1,000 mcg by mouth daily.     No current facility-administered medications for this visit.      Allergies:  Allergies  Allergen Reactions  . Codeine Palpitations    Made heart hurt  . Ibuprofen Palpitations     Past Medical History, Surgical history, Social history, and Family History were discussed today and updated.  Unchanged from previous.  Physical Exam: Blood pressure 113/65, pulse 87, temperature 97.8 F (36.6 C), temperature source Oral, resp. rate (!) 24, height 5\' 5"  (1.651 m), weight 154 lb 9.6 oz (70.1 kg), SpO2 100 %. ECOG: 1 General appearance: Chronically ill-appearing gentleman appeared comfortable. Head: Normocephalic, without obvious abnormality  Oral mucosa: Mucous membranes are moist and pink. Eyes: Pupils are equal and round reactive to light. Lymph nodes: Cervical, supraclavicular, and axillary nodes normal. Heart:regular rate and rhythm, S1, S2 normal, no murmur, click, rub or gallop Lung: Clear in all lung fields without rhonchi or wheezes or dullness to percussion. Abdomin: Soft, nontender without any rebound or guarding.  No shifting dullness or ascites. Musculoskeletal: No joint deformity or effusion. Skin: No rashes or lesions.   Lab Results: Lab Results  Component Value Date   WBC 13.9 (H) 09/20/2017   HGB 8.2 (L) 09/20/2017   HCT 25.2 (L) 09/20/2017   MCV 92.3 09/20/2017   PLT 253 09/20/2017     Chemistry      Component Value Date/Time   NA 133 (L) 09/20/2017 2304   NA 140 10/25/2013 0955   K 3.6 09/20/2017 2304   K 4.5 10/25/2013 0955   CL 101 09/20/2017 2304   CL 108 (H) 01/24/2013 1518   CO2 24 09/20/2017 2304   CO2 23 10/25/2013 0955   BUN 20 09/20/2017 2304   BUN 19.7 10/25/2013 0955   CREATININE 0.80 11/23/2017 0756   CREATININE 1.0 10/25/2013 0955      Component Value Date/Time   CALCIUM 8.4 (L) 09/20/2017 2304   CALCIUM 9.3 10/25/2013 0955   ALKPHOS 55 08/10/2017 1148   ALKPHOS 75 10/25/2013 0955   AST 14 (L) 08/10/2017 1148   AST 20 10/25/2013 0955   ALT 9 (L) 08/10/2017 1148   ALT 21 10/25/2013 0955   BILITOT 0.6 08/10/2017 1148   BILITOT 0.96 10/25/2013 0955     EXAM: CT ABDOMEN AND PELVIS WITH  CONTRAST  TECHNIQUE: Multidetector CT imaging of the abdomen and pelvis was performed using the standard protocol following bolus administration of intravenous contrast.  CONTRAST:  140mL ISOVUE-300 IOPAMIDOL (ISOVUE-300) INJECTION 61%  COMPARISON:  07/07/2017  FINDINGS: Lower chest: Scarring in the right middle lobe, right lower lobe, lingula, and to a lesser extent in the left lower lobe. Not appreciably changed from 07/07/2017. Cylindrical bronchiectasis in the right lower lobe.  Trace right pleural fluid. Coronary and thoracic aortic atherosclerotic calcification. Mild cardiomegaly.  Hepatobiliary: Wedge-shaped accentuated enhancement anteriorly in the right hepatic lobe. About 8 small hypodense hepatic lesions in the liver with increasing conspicuity from the prior exam. Mild dependent density in the gallbladder suspicious for small gallstones.  Pancreas: Unremarkable  Spleen: Unremarkable  Adrenals/Urinary Tract: Adrenal glands normal. Small left mid kidney cyst measuring 0.8 cm in diameter.  Mild bilateral hydroureter extending to the right-sided ileostomy  which appears patent. No filling defect along the visualized urothelium.  Stomach/Bowel: There is poor separation of the distal sigmoid colon and the lower rectum from a 6.2 by 5.6 by 6.2 cm (volume = 110 cm^3) pelvic lesion with thick irregular enhancing walls and internal fluid density. The thick enhancing walls extend along the fascia margins of the thickened operator internus muscles, and the appearance favors pelvic abscess. The collection appears Morris closely associated with the anterior rectum, and on image 58/6 a connection between the anterior rectum and the lesion cannot be excluded. That said, there does appear to be some contrast medium and gas in the distal rectum which is not currently seen within the pelvic lesion. The lesion tracks back towards the right  sciatic notch.  Vascular/Lymphatic: Aortoiliac atherosclerotic vascular disease. At the right iliac bifurcation there is a 2.8 by 2.0 cm irregularly marginated tumor deposit or lymph node. This is immediately adjacent to a 1.9 by 1.4 cm right external iliac irregularly marginated lymph node on image 73/2. Low-density left common iliac node 1.1 cm in short axis on image 63/2, new. Left external iliac node 0.8 cm. Right inguinal lymph node 1.3 cm in short axis on image 90/2, formerly 0.6 cm. Lymph node along the left obturator notch measures 1.1 cm in short axis on image 89/2 and is new compared to the prior exam.  Satisfactorily positioned infrarenal IVC.  Reproductive: In the vicinity of the base of the penis and perineum there are 2 enhancing lesions, the more proximal lesion measuring 2.4 by 1.9 by 2.2 cm, and the more distal lesion measuring 2.2 by 2.2 by 2.3 cm, both with thick marginal enhancement and some internal hypoenhancement. There is some mild adjacent nodular enhancement also along the perineum below the base of the penis as on image 104/2.  Other: Scattered lower omental foci of tumor are present along the anterior abdominal wall. An index tumor deposit on image 81/2 measures 2.7 by 1.5 cm and an index deposit on image 78/2 measures 2.2 by 1.2 cm. These omental deposits of tumor are new. A new tumor deposit anterior to the right psoas muscle on image 65/2 measures 1.2 by 1.3 cm. Other inguinal and external iliac lymph nodes are enlarged.  Musculoskeletal: Thickening of the bilateral operator internus muscles. Bilateral degenerative hip arthropathy. Degenerative facet arthropathy in the lumbar spine along with mild disc bulges and intervertebral spurring.  IMPRESSION: 1. Lower pelvic abscess or tumor, 110 cubic cm, along the bladder resection site, poor definition of the wall of the lower rectum and distal sigmoid colon along the margins of this lesion.  Thick enhancing margins extend along the thickened operator internus muscles and towards the right sciatic notch. 2. Several tumor nodules, less likely microabscesses, along the perineum adjacent to the base of the penis. New tumor nodules anteriorly along the lower omentum with considerable new adenopathy in the pelvis compatible with malignancy. 3. Mild bilateral hydronephrosis but otherwise patent appearing ileal loop. 4. There are multiple small hypodense hepatic lesions which have increasing conspicuity compared the prior exam. Although technically too small to characterize, surveillance is probably warranted given that these were not well seen previously and could reflect small metastatic lesions. 5. Other imaging findings of potential clinical significance: Bibasilar scarring with cylindrical bronchiectasis in the right lower lobe. Mild cardiomegaly. Aortic Atherosclerosis (ICD10-I70.0). Coronary atherosclerosis.  Impression and Plan:  77 year old gentleman with the following issues:  1.  Advanced high-grade urothelial carcinoma  of the bladder diagnosed in July 2018.  He is status postcystoprostatectomy with a final pathology showed urothelial carcinoma with glandular differentiation as well as squamous differentiation.  His operation completed on 08/11/2017.  CT scan obtained on 11/23/2017 was personally reviewed today and showed tumor recurrence around the bladder site and the base of the penis.  These findings were discussed today with the patient and his family.  Although there is suggestion of possible microabscesses he has clear evidence of progression in other areas.  Risks and benefits of palliative chemotherapy was discussed today and attempt to palliate his symptoms predominantly pain.  Regimen of choice would be carboplatin and Gemzar given every 3 weeks without day 8.  Complications that include nausea, fatigue, myelosuppression, neutropenia, neutropenic sepsis,  thrombocytopenia, bleeding among others.  The benefit would be palliation of his symptoms predominantly pain.  After discussion today is agreeable to proceed after chemotherapy education class   2.  IV access: Risks and benefits of Port-A-Cath insertion was discussed today.  Complications include bleeding, thrombosis and infection a possibility.  After discussion is agreeable to proceed and EMLA cream will be given to the patient.  3. History of pulmonary embolism: He is not on anticoagulation because of risk of bleeding.  IVC filter remains in place.  4. Renal insufficiency: His creatinine in December 2018 was 0.83 with reasonably normal kidney function.  Carboplatin dosing will be done accordingly.  5.  Pelvic pain: Related to tumor recurrence and systemic chemotherapy will be used to palliate his pain.  Prescription for oxycodone was given to the patient today to use every 4 hours in the meantime.  6.  Antiemetics: Prescription for Compazine was made available to the patient.  7.  Goals of care and prognosis: The goal of chemotherapy is palliative and his disease cannot be cured.  He has a poor prognosis overall with disease will be attempt to palliate using chemotherapy.  8. Follow-up: Will be in the next few weeks to start therapy.  25  minutes was spent with the patient face-to-face today.  More than 50% of time was dedicated to patient counseling, education and answering questions regarding diagnosis, prognosis and coordination of his care.    Zola Button, MD 2/11/201911:32 AM

## 2017-11-27 NOTE — Progress Notes (Signed)
START ON PATHWAY REGIMEN - Bladder     A cycle is every 21 days:     Carboplatin      Gemcitabine   **Always confirm dose/schedule in your pharmacy ordering system**    Patient Characteristics: Metastatic Disease, First Line, No Prior Neoadjuvant/Adjuvant Therapy, Poor Renal Function (CrCl < 50 mL/min), Unknown PD-L1 Expression AJCC M Category: M1b AJCC N Category: NX AJCC T Category: TX Current evidence of distant metastases<= Yes AJCC 8 Stage Grouping: IVB Line of Therapy: First Line Would you be surprised if this patient died  in the next year<= I would be surprised if this patient died in the next year Prior Neoadjuvant/Adjuvant Therapy<= No Renal Function: Poor Renal Function (CrCl < 50 mL/min) PD-L1 Expression Status: Unknown PD-L1 Expression Intent of Therapy: Non-Curative / Palliative Intent, Discussed with Patient

## 2017-11-27 NOTE — Telephone Encounter (Signed)
Gave avs and calendar for February - April

## 2017-11-28 ENCOUNTER — Inpatient Hospital Stay: Payer: Medicare Other

## 2017-11-29 ENCOUNTER — Encounter: Payer: Self-pay | Admitting: Oncology

## 2017-11-29 ENCOUNTER — Encounter: Payer: Self-pay | Admitting: *Deleted

## 2017-11-29 NOTE — Progress Notes (Signed)
Submitted auth request for Lidocaine/Prilocaine today.  Status is pending.

## 2017-11-30 ENCOUNTER — Encounter: Payer: Self-pay | Admitting: Oncology

## 2017-11-30 NOTE — Progress Notes (Signed)
Received PA determination from [facility] for Lidocaine-Prilocaine cream.  PA approved 10/15/17-10/16/18.  Faxed a copy of approval to [employer]. Fax received ok per confirmation sheet.

## 2017-12-01 ENCOUNTER — Other Ambulatory Visit: Payer: Self-pay | Admitting: General Surgery

## 2017-12-01 ENCOUNTER — Other Ambulatory Visit: Payer: Self-pay | Admitting: Radiology

## 2017-12-01 DIAGNOSIS — Z436 Encounter for attention to other artificial openings of urinary tract: Secondary | ICD-10-CM | POA: Diagnosis not present

## 2017-12-01 DIAGNOSIS — I2510 Atherosclerotic heart disease of native coronary artery without angina pectoris: Secondary | ICD-10-CM | POA: Diagnosis not present

## 2017-12-01 DIAGNOSIS — Z483 Aftercare following surgery for neoplasm: Secondary | ICD-10-CM | POA: Diagnosis not present

## 2017-12-01 DIAGNOSIS — C675 Malignant neoplasm of bladder neck: Secondary | ICD-10-CM | POA: Diagnosis not present

## 2017-12-01 DIAGNOSIS — I252 Old myocardial infarction: Secondary | ICD-10-CM | POA: Diagnosis not present

## 2017-12-01 DIAGNOSIS — C61 Malignant neoplasm of prostate: Secondary | ICD-10-CM | POA: Diagnosis not present

## 2017-12-04 ENCOUNTER — Ambulatory Visit (HOSPITAL_COMMUNITY)
Admission: RE | Admit: 2017-12-04 | Discharge: 2017-12-04 | Disposition: A | Discharge status: Home / Self Care | Payer: Medicare Other | Source: Ambulatory Visit | Attending: Oncology | Admitting: Oncology

## 2017-12-04 ENCOUNTER — Encounter (HOSPITAL_COMMUNITY): Payer: Self-pay

## 2017-12-04 ENCOUNTER — Other Ambulatory Visit: Payer: Self-pay | Admitting: Oncology

## 2017-12-04 DIAGNOSIS — I252 Old myocardial infarction: Secondary | ICD-10-CM | POA: Insufficient documentation

## 2017-12-04 DIAGNOSIS — Z86711 Personal history of pulmonary embolism: Secondary | ICD-10-CM | POA: Diagnosis not present

## 2017-12-04 DIAGNOSIS — I10 Essential (primary) hypertension: Secondary | ICD-10-CM | POA: Diagnosis not present

## 2017-12-04 DIAGNOSIS — Z85118 Personal history of other malignant neoplasm of bronchus and lung: Secondary | ICD-10-CM | POA: Insufficient documentation

## 2017-12-04 DIAGNOSIS — C679 Malignant neoplasm of bladder, unspecified: Secondary | ICD-10-CM | POA: Diagnosis not present

## 2017-12-04 DIAGNOSIS — Z8546 Personal history of malignant neoplasm of prostate: Secondary | ICD-10-CM | POA: Insufficient documentation

## 2017-12-04 DIAGNOSIS — I2510 Atherosclerotic heart disease of native coronary artery without angina pectoris: Secondary | ICD-10-CM | POA: Insufficient documentation

## 2017-12-04 DIAGNOSIS — Z79899 Other long term (current) drug therapy: Secondary | ICD-10-CM | POA: Insufficient documentation

## 2017-12-04 DIAGNOSIS — Z9079 Acquired absence of other genital organ(s): Secondary | ICD-10-CM | POA: Diagnosis not present

## 2017-12-04 DIAGNOSIS — Z5111 Encounter for antineoplastic chemotherapy: Secondary | ICD-10-CM | POA: Diagnosis not present

## 2017-12-04 DIAGNOSIS — E7800 Pure hypercholesterolemia, unspecified: Secondary | ICD-10-CM | POA: Insufficient documentation

## 2017-12-04 DIAGNOSIS — Z902 Acquired absence of lung [part of]: Secondary | ICD-10-CM | POA: Insufficient documentation

## 2017-12-04 DIAGNOSIS — E119 Type 2 diabetes mellitus without complications: Secondary | ICD-10-CM | POA: Diagnosis not present

## 2017-12-04 DIAGNOSIS — M1A9XX0 Chronic gout, unspecified, without tophus (tophi): Secondary | ICD-10-CM | POA: Diagnosis not present

## 2017-12-04 HISTORY — PX: IR FLUORO GUIDE PORT INSERTION RIGHT: IMG5741

## 2017-12-04 HISTORY — PX: IR US GUIDE VASC ACCESS RIGHT: IMG2390

## 2017-12-04 LAB — CREATININE, SERUM
Creatinine, Ser: 0.97 mg/dL (ref 0.61–1.24)
GFR calc non Af Amer: >60 mL/min (ref >60)

## 2017-12-04 LAB — CBC
HEMATOCRIT: 32.7 % — AB (ref 39.0–52.0)
HEMOGLOBIN: 10.6 g/dL — AB (ref 13.0–17.0)
MCH: 29.9 pg (ref 26.0–34.0)
MCHC: 32.4 g/dL (ref 30.0–36.0)
MCV: 92.1 fL (ref 78.0–100.0)
Platelets: 287 10*3/uL (ref 150–400)
RBC: 3.55 MIL/uL — ABNORMAL LOW (ref 4.22–5.81)
RDW: 15.3 % (ref 11.5–15.5)
WBC: 15.1 10*3/uL — ABNORMAL HIGH (ref 4.0–10.5)

## 2017-12-04 LAB — PROTIME-INR
INR: 1.06
Prothrombin Time: 13.7 seconds (ref 11.4–15.2)

## 2017-12-04 MED ORDER — FENTANYL CITRATE (PF) 100 MCG/2ML IJ SOLN
INTRAMUSCULAR | Status: AC
  Filled 2017-12-04: qty 4

## 2017-12-04 MED ORDER — HEPARIN SOD (PORK) LOCK FLUSH 100 UNIT/ML IV SOLN
INTRAVENOUS | Status: AC
  Filled 2017-12-04: qty 5

## 2017-12-04 MED ORDER — HYDROMORPHONE HCL 1 MG/ML IJ SOLN
0.5000 mg | INTRAMUSCULAR | Status: DC | PRN
  Administered 2017-12-04: 0.5 mg via INTRAVENOUS
  Filled 2017-12-04: qty 1

## 2017-12-04 MED ORDER — FENTANYL CITRATE (PF) 100 MCG/2ML IJ SOLN
INTRAMUSCULAR | Status: AC | PRN
  Administered 2017-12-04: 25 ug via INTRAVENOUS
  Administered 2017-12-04: 50 ug via INTRAVENOUS

## 2017-12-04 MED ORDER — CEFAZOLIN SODIUM-DEXTROSE 2-4 GM/100ML-% IV SOLN
INTRAVENOUS | Status: AC
  Administered 2017-12-04: 2 g via INTRAVENOUS
  Filled 2017-12-04: qty 100

## 2017-12-04 MED ORDER — MIDAZOLAM HCL 2 MG/2ML IJ SOLN
INTRAMUSCULAR | Status: AC
  Filled 2017-12-04: qty 4

## 2017-12-04 MED ORDER — LIDOCAINE-EPINEPHRINE (PF) 2 %-1:200000 IJ SOLN
INTRAMUSCULAR | Status: AC
  Filled 2017-12-04: qty 20

## 2017-12-04 MED ORDER — HEPARIN SOD (PORK) LOCK FLUSH 100 UNIT/ML IV SOLN
INTRAVENOUS | Status: AC | PRN
  Administered 2017-12-04: 500 [IU] via INTRAVENOUS

## 2017-12-04 MED ORDER — MIDAZOLAM HCL 2 MG/2ML IJ SOLN
INTRAMUSCULAR | Status: AC | PRN
  Administered 2017-12-04: 0.5 mg via INTRAVENOUS
  Administered 2017-12-04: 1 mg via INTRAVENOUS

## 2017-12-04 MED ORDER — CEFAZOLIN SODIUM-DEXTROSE 2-4 GM/100ML-% IV SOLN
2.0000 g | INTRAVENOUS | Status: AC
  Administered 2017-12-04: 2 g via INTRAVENOUS

## 2017-12-04 MED ORDER — SODIUM CHLORIDE 0.9 % IV SOLN
INTRAVENOUS | Status: DC
  Administered 2017-12-04: 09:00:00 via INTRAVENOUS

## 2017-12-04 MED ORDER — LIDOCAINE HCL (PF) 2 % IJ SOLN
INTRAMUSCULAR | Status: AC
  Filled 2017-12-04: qty 10

## 2017-12-04 NOTE — Procedures (Signed)
Interventional Radiology Procedure Note  Procedure: Placement of a right IJ approach single lumen PowerPort.  Tip is positioned at the superior cavoatrial junction and catheter is ready for immediate use.  Complications: No immediate Recommendations:  - Ok to shower tomorrow - Do not submerge for 7 days - Routine line care   Signed,  Heath K. McCullough, MD   

## 2017-12-04 NOTE — Sedation Documentation (Signed)
Patient is resting comfortably with eyes closed. No s/s of pain/discomfort noted during lidocaine injection

## 2017-12-04 NOTE — Sedation Documentation (Signed)
Patient is resting comfortably with eyes closed in NAD. MD continues to suture port pocket at this time

## 2017-12-04 NOTE — H&P (Signed)
Referring Physician(s): Wyatt Portela  Supervising Physician: Jacqulynn Cadet  Patient Status:  Daniel Vega OP  Chief Complaint:  "I'm here for a port a cath"  Subjective: Patient familiar to IR service from prior thoracenteses in 2013 and 2015 as well as IVC filter placement in 2015.  He has a history of high-grade metastatic bladder carcinoma, status post radical cystoprostatectomy and bilateral lymphadenectomy on 08/11/17.  He also has prior history of prostate cancer with seed implantation and left lung cancer with prior lobectomy.  He now has evidence of disease progression and presents today for Port-A-Cath placement for palliative chemotherapy.  He currently denies fever, headache, chest pain, dyspnea, cough, nausea, vomiting.  He does have poor appetite, abdominal/pelvic discomfort as well as intermittent drainage from perineal incision site and penis. Past Medical History:  Diagnosis Date  . CAD (coronary artery disease)    CARDIOLOGIST-  DR Martinique-- hx MI 1994, medically treated;  cath 1997 dRCA occlusion w/ collateral flow  . Cancer [facility])    Prostate and bladder  . Chronic gout    as of 05-23-2017 -- per pt stable   . Dysuria   . Elevated ferritin level    followed by dr Alen Blew ([facility])-- per last note differiential dx hx alcohol use versus questionable hemochromatosis  . Full dentures   . Hematuria    chronic  . History of adenomatous polyp of colon   . History of kidney stones    multiple-  calcium oxalate  . History of MI (myocardial infarction)    remote hx 1994-- treated medically  . History of pleural effusion 07/2014   in setting acute blood loss taking xarelto for PE--- s/p  right pleural thoracentesis w/ 600cc  . History of prostate cancer    urologist-  dr Diona Fanti---  primary adenocarcinoma-- Gleason 3+3--  06-27-2008  s/p  radioactive prostate seed implants  . History of pulmonary embolism 07/14/2014   w/  RLL pulmary infarction  . History of  pulmonary infarction 07/14/2014   RLL due to PE  . History of recurrent pneumonia   . History of urinary retention    10/ 2015 gross hematuria / clots due to taking xarelto  . Hydronephrosis of right kidney   . Hypercholesteremia   . Hypertension   . Nocturia   . OA (osteoarthritis)    joints  . Personal history of lung cancer 05/1998   adenosquamous left lower lobe primary carcinoma--- s/p left lower lobectomy 08/ 1999 /  per last CT no recurrence  . RA (rheumatoid arthritis) (HCC)    hands  . Renal insufficiency   . S/P IVC filter placed 07-28-2014  . Type 2 diabetes mellitus ([place])   . Wears glasses    Past Surgical History:  Procedure Laterality Date  . CARDIAC CATHETERIZATION  1997  dr Martinique  . CARDIAC CATHETERIZATION  02-11-2011  dr Martinique   (abnormal stress echo) single vessel occlusive artherosclerotic CAD involving the distal RCA w/ excellent collateral flow;  good LVSF, ef 55%  . CATARACT EXTRACTION W/PHACO Left 12/10/2015   Procedure: CATARACT EXTRACTION PHACO AND INTRAOCULAR LENS PLACEMENT LEFT EYE CDE=10.98;  Surgeon: Tonny Branch, MD;  Location: [facility];  Service: Ophthalmology;  Laterality: Left;  . CATARACT EXTRACTION W/PHACO Right 01/07/2016   Procedure: CATARACT EXTRACTION PHACO AND INTRAOCULAR LENS PLACEMENT (IOC);  Surgeon: Tonny Branch, MD;  Location: [facility];  Service: Ophthalmology;  Laterality: Right;  CDE:15.11  . COLONOSCOPY  last one 2016 approx.  . CYSTOSCOPY  W/ URETERAL STENT PLACEMENT Bilateral 05/24/2017   Procedure: CYSTOSCOPY WITH RETROGRADE PYELOGRAM/ POSSIBLE URETERAL STENT PLACEMENT;  Surgeon: Franchot Gallo, MD;  Location: [facility];  Service: Urology;  Laterality: Bilateral;   [phone] MEDICARE-[id number] A BCBS-[id number]  . CYSTOSCOPY WITH BIOPSY N/A 05/24/2017   Procedure: CYSTOSCOPY WITH BIOPSY;  Surgeon: Franchot Gallo, MD;  [facility];  Service: Urology;  Laterality: N/A;  . CYSTOSCOPY WITH  INJECTION N/A 08/11/2017   Procedure: CYSTOSCOPY WITH INJECTION OF INDOCYANINE GREEN DYE;  Surgeon: Alexis Frock, MD;  Location: [facility];  Service: Urology;  Laterality: N/A;  . CYSTOSCOPY WITH RETROGRADE PYELOGRAM, URETEROSCOPY AND STENT PLACEMENT Right 07/31/2014   Procedure: CYSTOSCOPY WITH RETROGRADE PYELOGRAM, URETEROSCOPY WITH FULGURATION AND STENT PLACEMENT;  Surgeon: [name], MD;  Location: [facility];  Service: Urology;  Laterality: Right;  . INCISION AND DRAINAGE ABSCESS N/A 10/04/2017   Procedure: INCISION AND DRAINAGE PERINEAL ABSCESS AND PACKING;  Surgeon: Alexis Frock, MD;  Location: [facility];  Service: Urology;  Laterality: N/A;  . IVC FILTER INSERTION  07/28/2014  . LUMBAR LAMINECTOMY/DECOMPRESSION MICRODISCECTOMY  08/10/2012   Procedure: LUMBAR LAMINECTOMY/DECOMPRESSION MICRODISCECTOMY 1 LEVEL;  Surgeon: Winfield Cunas, MD;  Location: [place] [facility];  Service: Neurosurgery;  Laterality: Left;  LEFT Lumbar five-sacral one microdiskectomy  . LUNG LOBECTOMY Left 06-11-1998   dr Cyndia Bent   LLL  . RADIOACTIVE PROSTATE SEED IMPLANTS  06-27-2008  dr Diona Fanti  at [facility]  . TESTICLE TORSION REDUCTION Right 1962  . TRANSTHORACIC ECHOCARDIOGRAM  07/15/2014   severe concentrial LVH, ef 90-24%, grade 1 diastolic dysfunction/  mild TR/  PASP 46mmHg  . VEIN LIGATION AND STRIPPING Right      Allergies: Codeine and Ibuprofen  Medications: Prior to Admission medications   Medication Sig Start Date End Date Taking? Authorizing Provider  acetaminophen (TYLENOL) 500 MG tablet Take 1,000 mg by mouth every 6 (six) hours as needed for moderate pain.   Yes [provider]  allopurinol (ZYLOPRIM) 100 MG tablet Take 100 mg by mouth daily after breakfast.  07/23/12  Yes [provider]  atorvastatin (LIPITOR) 40 MG tablet Take 1 tablet (40 mg total) by mouth every morning. 04/30/15  Yes Martinique, Peter M, MD  folic acid (FOLVITE) 1 MG tablet Take 1 mg by mouth daily.   Yes  [provider]  HYDROcodone-acetaminophen (NORCO/VICODIN) 5-325 MG tablet Take 1 tablet by mouth every 6 (six) hours as needed for moderate pain. 11/20/17  Yes Wyatt Portela, MD  oxyCODONE (OXY IR/ROXICODONE) 5 MG immediate release tablet Take 1 tablet (5 mg total) by mouth every 4 (four) hours as needed for severe pain. 11/27/17  Yes Wyatt Portela, MD  traMADol (ULTRAM) 50 MG tablet Take 1-2 tablets (50-100 mg total) every 6 (six) hours as needed by mouth for moderate pain or severe pain. Post-operatively 08/22/17 08/22/18 Yes Alexis Frock, MD  verapamil (CALAN-SR) 240 MG CR tablet Take 240 mg by mouth 2 (two) times daily.    Yes [provider]  vitamin B-12 (CYANOCOBALAMIN) 1000 MCG tablet Take 1,000 mcg by mouth daily.   Yes [provider]  ALPRAZolam Duanne Moron) 0.5 MG tablet Take 0.5 mg by mouth 3 (three) times daily as needed for anxiety (anxiety).     [provider]  glucosamine-chondroitin 500-400 MG tablet Take 1 tablet by mouth 2 (two) times daily.     [provider]  lidocaine-prilocaine (EMLA) cream Apply 1 application topically as needed. 11/27/17   Wyatt Portela,  MD  nitroGLYCERIN (NITROSTAT) 0.4 MG SL tablet Place 0.4 mg under the tongue every 5 (five) minutes as needed for chest pain. For chest pain    [provider]  prochlorperazine (COMPAZINE) 10 MG tablet Take 1 tablet (10 mg total) by mouth every 6 (six) hours as needed for nausea or vomiting. 11/27/17   Wyatt Portela, MD     Vital Signs: Blood pressure 102/65, heart rate 97, respirations 20, temp 98, O2 sat 100% room air     Physical Exam awake, alert.  Chest with few right basilar crackles, left clear.  Heart with regular rate and rhythm.  Abdomen soft, positive bowel sounds, intact urostomy, mild generalized tenderness to palpation; trace pretibial edema bilaterally.  Imaging: No results found.  Labs:  CBC: Recent Labs    08/20/17 0438 08/21/17 0435  09/20/17 2304 12/04/17 0818  WBC 13.2* 12.2* 13.9* 15.1*  HGB 8.4* 8.2* 8.2* 10.6*  HCT 25.8* 25.0* 25.2* 32.7*  PLT 290 277 253 287    COAGS: Recent Labs    12/04/17 0818  INR 1.06    BMP: Recent Labs    08/19/17 0449 08/20/17 0438 08/21/17 0435 09/20/17 2304 11/23/17 0756 12/04/17 0818  NA 133* 136 137 133*  --   --   K 3.5 3.3* 3.5 3.6  --   --   CL 92* 98* 105 101  --   --   CO2 31 29 22 24   --   --   GLUCOSE 112* 94 74 108*  --   --   BUN 24* 20 12 20   --   --   CALCIUM 8.5* 8.0* 7.9* 8.4*  --   --   CREATININE 1.00 0.91 0.76 0.83 0.80 0.97  GFRNONAA >60 >60 >60 >60  --  >60  GFRAA >60 >60 >60 >60  --  >60    LIVER FUNCTION TESTS: Recent Labs    05/15/17 1155 07/07/17 0126 08/10/17 1148  BILITOT 0.7 1.1 0.6  AST 16 14* 14*  ALT 13* 9* 9*  ALKPHOS 50 42 55  PROT 7.9 6.7 7.6  ALBUMIN 4.1 3.5 3.7    Assessment and Plan: Pt with history of high-grade metastatic bladder carcinoma, status post radical cystoprostatectomy and bilateral lymphadenectomy on 08/11/17.  He also has prior history of prostate cancer with seed implantation and left lung cancer with prior lobectomy.  He now has evidence of disease progression and presents today for Port-A-Cath placement for palliative chemotherapy.Risks and benefits discussed with the patient/spouse including, but not limited to bleeding, infection, pneumothorax, or fibrin sheath development and need for additional procedures.  All of the patient's questions were answered, patient is agreeable to proceed. Consent signed and in chart.     Electronically Signed: D. Rowe Robert, PA-C 12/04/2017, 9:16 AM   I spent a total of 25 minutes at the the patient's bedside AND on the patient's hospital floor or unit, greater than 50% of which was counseling/coordinating care for Port-A-Cath placement

## 2017-12-04 NOTE — Sedation Documentation (Signed)
Patient is resting comfortably with eyes closed in NAD. 

## 2017-12-04 NOTE — Sedation Documentation (Signed)
Patient is resting comfortably with eyes closed in NAD. No s/s of pain/discomfort noted

## 2017-12-04 NOTE — Sedation Documentation (Signed)
MD suturing port pocket at this time

## 2017-12-04 NOTE — Discharge Instructions (Signed)
Moderate Conscious Sedation, Adult, Care After These instructions provide you with information about caring for yourself after your procedure. Your health care provider may also give you more specific instructions. Your treatment has been planned according to current medical practices, but problems sometimes occur. Call your health care provider if you have any problems or questions after your procedure. What can I expect after the procedure? After your procedure, it is common:  To feel sleepy for several hours.  To feel clumsy and have poor balance for several hours.  To have poor judgment for several hours.  To vomit if you eat too soon.  Follow these instructions at home: For at least 24 hours after the procedure:   Do not: ? Participate in activities where you could fall or become injured. ? Drive. ? Use heavy machinery. ? Drink alcohol. ? Take sleeping pills or medicines that cause drowsiness. ? Make important decisions or sign legal documents. ? Take care of children on your own.  Rest. Eating and drinking  Follow the diet recommended by your health care provider.  If you vomit: ? Drink water, juice, or soup when you can drink without vomiting. ? Make sure you have little or no nausea before eating solid foods. General instructions  Have a responsible adult stay with you until you are awake and alert.  Take over-the-counter and prescription medicines only as told by your health care provider.  If you smoke, do not smoke without supervision.  Keep all follow-up visits as told by your health care provider. This is important. Contact a health care provider if:  You keep feeling nauseous or you keep vomiting.  You feel light-headed.  You develop a rash.  You have a fever. Get help right away if:  You have trouble breathing. This information is not intended to replace advice given to you by your health care provider. Make sure you discuss any questions you  have with your health care provider. Document Released: 07/24/2013 Document Revised: 03/07/2016 Document Reviewed: 01/23/2016 Elsevier Interactive Patient Education  2018 [place] Insertion, Care After This sheet gives you information about how to care for yourself after your procedure. Your health care provider may also give you more specific instructions. If you have problems or questions, contact your health care provider. What can I expect after the procedure? After your procedure, it is common to have:  Discomfort at the port insertion site.  Bruising on the skin over the port. This should improve over 3-4 days.  Follow these instructions at home: [facility] care  After your port is placed, you will get a manufacturer's information card. The card has information about your port. Keep this card with you at all times.  Take care of the port as told by your health care provider. Ask your health care provider if you or a family member can get training for taking care of the port at home. A home health care nurse may also take care of the port.  Make sure to remember what type of port you have. Incision care  Follow instructions from your health care provider about how to take care of your port insertion site. Make sure you: ? Wash your hands with soap and water before you change your bandage (dressing). If soap and water are not available, use hand sanitizer. ? Change your dressing as told by your health care provider.  You may remove your dressing tomorrow. ? Leave skin glue in place. These skin closures  may need to stay in place for 2 weeks or longer. If adhesive strip edges start to loosen and curl up, you may trim the loose edges. Do not remove adhesive strips completely unless your health care provider tells you to do that.  DO NOT use EMLA cream for 2 weeks after port placement as this cream will remove surgical glue on your incision.  Check your port insertion  site every day for signs of infection. Check for: ? More redness, swelling, or pain. ? More fluid or blood. ? Warmth. ? Pus or a bad smell. General instructions  Do not take baths, swim, or use a hot tub until your health care provider approves.  You may shower tomorrow.  Do not lift anything that is heavier than 10 lb (4.5 kg) for a week, or as told by your health care provider.  Ask your health care provider when it is okay to: ? Return to work or school. ? Resume usual physical activities or sports.  Do not drive for 24 hours if you were given a medicine to help you relax (sedative).  Take over-the-counter and prescription medicines only as told by your health care provider.  Wear a medical alert bracelet in case of an emergency. This will tell any health care providers that you have a port.  Keep all follow-up visits as told by your health care provider. This is important. Contact a health care provider if:  You have a fever or chills.  You have more redness, swelling, or pain around your port insertion site.  You have more fluid or blood coming from your port insertion site.  Your port insertion site feels warm to the touch.  You have pus or a bad smell coming from the port insertion site. Get help right away if:  You have chest pain or shortness of breath.  You have bleeding from your port that you cannot control. Summary  Take care of the port as told by your health care provider.  Change your dressing as told by your health care provider.  Keep all follow-up visits as told by your health care provider. This information is not intended to replace advice given to you by your health care provider. Make sure you discuss any questions you have with your health care provider. Document Released: 07/24/2013 Document Revised: 08/24/2016 Document Reviewed: 08/24/2016 Elsevier Interactive Patient Education  2017 [employer].

## 2017-12-07 ENCOUNTER — Inpatient Hospital Stay: Payer: Medicare Other

## 2017-12-07 VITALS — BP 113/52 | HR 84 | Temp 97.9°F | Resp 18

## 2017-12-07 DIAGNOSIS — C679 Malignant neoplasm of bladder, unspecified: Secondary | ICD-10-CM

## 2017-12-07 DIAGNOSIS — Z95828 Presence of other vascular implants and grafts: Secondary | ICD-10-CM

## 2017-12-07 DIAGNOSIS — Z5111 Encounter for antineoplastic chemotherapy: Secondary | ICD-10-CM | POA: Diagnosis not present

## 2017-12-07 LAB — CMP (CANCER CENTER ONLY)
ALBUMIN: 2.8 g/dL — AB (ref 3.5–5.0)
ALT: 9 U/L (ref 0–55)
AST: 14 U/L (ref 5–34)
Alkaline Phosphatase: 64 U/L (ref 40–150)
Anion gap: 10 (ref 3–11)
BUN: 22 mg/dL (ref 7–26)
CHLORIDE: 100 mmol/L (ref 98–109)
CO2: 25 mmol/L (ref 22–29)
CREATININE: 0.99 mg/dL (ref 0.70–1.30)
Calcium: 9.8 mg/dL (ref 8.4–10.4)
GFR, Est AFR Am: >60 mL/min (ref >60)
GFR, Estimated: >60 mL/min (ref >60)
GLUCOSE: 179 mg/dL — AB (ref 70–140)
POTASSIUM: 4.3 mmol/L (ref 3.5–5.1)
Sodium: 135 mmol/L — ABNORMAL LOW (ref 136–145)
Total Bilirubin: 0.5 mg/dL (ref 0.2–1.2)
Total Protein: 7.3 g/dL (ref 6.4–8.3)

## 2017-12-07 LAB — CBC WITH DIFFERENTIAL (CANCER CENTER ONLY)
BASOS ABS: 0.1 10*3/uL (ref 0.0–0.1)
BASOS PCT: 1 %
EOS ABS: 0.1 10*3/uL (ref 0.0–0.5)
EOS PCT: 0 %
HCT: 29.3 % — ABNORMAL LOW (ref 38.4–49.9)
Hemoglobin: 9.6 g/dL — ABNORMAL LOW (ref 13.0–17.1)
LYMPHS PCT: 10 %
Lymphs Abs: 1.5 10*3/uL (ref 0.9–3.3)
MCH: 28.9 pg (ref 27.2–33.4)
MCHC: 32.6 g/dL (ref 32.0–36.0)
MCV: 88.7 fL (ref 79.3–98.0)
Monocytes Absolute: 1.2 10*3/uL — ABNORMAL HIGH (ref 0.1–0.9)
Monocytes Relative: 8 %
Neutro Abs: 12.3 10*3/uL — ABNORMAL HIGH (ref 1.5–6.5)
Neutrophils Relative %: 81 %
PLATELETS: 288 10*3/uL (ref 140–400)
RBC: 3.31 MIL/uL — AB (ref 4.20–5.82)
RDW: 15.8 % — ABNORMAL HIGH (ref 11.0–14.6)
WBC: 15.2 10*3/uL — AB (ref 4.0–10.3)

## 2017-12-07 MED ORDER — SODIUM CHLORIDE 0.9% FLUSH
10.0000 mL | INTRAVENOUS | Status: DC | PRN
  Administered 2017-12-07: 10 mL
  Filled 2017-12-07: qty 10

## 2017-12-07 MED ORDER — SODIUM CHLORIDE 0.9 % IV SOLN
1000.0000 mg/m2 | Freq: Once | INTRAVENOUS | Status: AC
  Administered 2017-12-07: 1786 mg via INTRAVENOUS
  Filled 2017-12-07: qty 46.97

## 2017-12-07 MED ORDER — SODIUM CHLORIDE 0.9 % IV SOLN: 10.0000 mg | Freq: Once | INTRAVENOUS | Status: DC

## 2017-12-07 MED ORDER — PALONOSETRON HCL INJECTION 0.25 MG/5ML
0.2500 mg | Freq: Once | INTRAVENOUS | Status: AC
  Administered 2017-12-07: 0.25 mg via INTRAVENOUS

## 2017-12-07 MED ORDER — SODIUM CHLORIDE 0.9 % IV SOLN
Freq: Once | INTRAVENOUS | Status: AC
  Administered 2017-12-07: 13:00:00 via INTRAVENOUS

## 2017-12-07 MED ORDER — PALONOSETRON HCL INJECTION 0.25 MG/5ML
INTRAVENOUS | Status: AC
  Filled 2017-12-07: qty 5

## 2017-12-07 MED ORDER — DEXAMETHASONE SODIUM PHOSPHATE 10 MG/ML IJ SOLN
INTRAMUSCULAR | Status: AC
  Filled 2017-12-07: qty 1

## 2017-12-07 MED ORDER — HEPARIN SOD (PORK) LOCK FLUSH 100 UNIT/ML IV SOLN
500.0000 [IU] | Freq: Once | INTRAVENOUS | Status: AC | PRN
  Administered 2017-12-07: 500 [IU]
  Filled 2017-12-07: qty 5

## 2017-12-07 MED ORDER — SODIUM CHLORIDE 0.9 % IV SOLN
441.5000 mg | Freq: Once | INTRAVENOUS | Status: AC
  Administered 2017-12-07: 440 mg via INTRAVENOUS
  Filled 2017-12-07: qty 44

## 2017-12-07 MED ORDER — DEXAMETHASONE SODIUM PHOSPHATE 10 MG/ML IJ SOLN
10.0000 mg | Freq: Once | INTRAMUSCULAR | Status: AC
  Administered 2017-12-07: 10 mg via INTRAVENOUS

## 2017-12-07 NOTE — Patient Instructions (Signed)
[facility] Discharge Instructions for Patients Receiving Chemotherapy  Today you received the following chemotherapy agents Gemzar and Carboplatin.  To help prevent nausea and vomiting after your treatment, we encourage you to take your nausea medication as directed.   If you develop nausea and vomiting that is not controlled by your nausea medication, call the clinic.   BELOW ARE SYMPTOMS THAT SHOULD BE REPORTED IMMEDIATELY:  *FEVER GREATER THAN 100.5 F  *CHILLS WITH OR WITHOUT FEVER  NAUSEA AND VOMITING THAT IS NOT CONTROLLED WITH YOUR NAUSEA MEDICATION  *UNUSUAL SHORTNESS OF BREATH  *UNUSUAL BRUISING OR BLEEDING  TENDERNESS IN MOUTH AND THROAT WITH OR WITHOUT PRESENCE OF ULCERS  *URINARY PROBLEMS  *BOWEL PROBLEMS  UNUSUAL RASH Items with * indicate a potential emergency and should be followed up as soon as possible.  Feel free to call the clinic should you have any questions or concerns. The clinic phone number is [phone].  Please show the [place] at check-in to the Emergency Department and triage nurse.  Gemcitabine injection What is this medicine? GEMCITABINE (jem SIT a been) is a chemotherapy drug. This medicine is used to treat many types of cancer like breast cancer, lung cancer, pancreatic cancer, and ovarian cancer. This medicine may be used for other purposes; ask your health care provider or pharmacist if you have questions. COMMON BRAND NAME(S): Gemzar What should I tell my health care provider before I take this medicine? They need to know if you have any of these conditions: -blood disorders -infection -kidney disease -liver disease -recent or ongoing radiation therapy -an unusual or allergic reaction to gemcitabine, other chemotherapy, other medicines, foods, dyes, or preservatives -pregnant or trying to get pregnant -breast-feeding How should I use this medicine? This drug is given as an infusion into a vein. It is  administered in a hospital or clinic by a specially trained health care professional. Talk to your pediatrician regarding the use of this medicine in children. Special care may be needed. Overdosage: If you think you have taken too much of this medicine contact a poison control center or emergency room at once. NOTE: This medicine is only for you. Do not share this medicine with others. What if I miss a dose? It is important not to miss your dose. Call your doctor or health care professional if you are unable to keep an appointment. What may interact with this medicine? -medicines to increase blood counts like filgrastim, pegfilgrastim, sargramostim -some other chemotherapy drugs like cisplatin -vaccines Talk to your doctor or health care professional before taking any of these medicines: -acetaminophen -aspirin -ibuprofen -ketoprofen -naproxen This list may not describe all possible interactions. Give your health care provider a list of all the medicines, herbs, non-prescription drugs, or dietary supplements you use. Also tell them if you smoke, drink alcohol, or use illegal drugs. Some items may interact with your medicine. What should I watch for while using this medicine? Visit your doctor for checks on your progress. This drug may make you feel generally unwell. This is not uncommon, as chemotherapy can affect healthy cells as well as cancer cells. Report any side effects. Continue your course of treatment even though you feel ill unless your doctor tells you to stop. In some cases, you may be given additional medicines to help with side effects. Follow all directions for their use. Call your doctor or health care professional for advice if you get a fever, chills or sore throat, or other symptoms of a  cold or flu. Do not treat yourself. This drug decreases your body's ability to fight infections. Try to avoid being around people who are sick. This medicine may increase your risk to bruise  or bleed. Call your doctor or health care professional if you notice any unusual bleeding. Be careful brushing and flossing your teeth or using a toothpick because you may get an infection or bleed more easily. If you have any dental work done, tell your dentist you are receiving this medicine. Avoid taking products that contain aspirin, acetaminophen, ibuprofen, naproxen, or ketoprofen unless instructed by your doctor. These medicines may hide a fever. Women should inform their doctor if they wish to become pregnant or think they might be pregnant. There is a potential for serious side effects to an unborn child. Talk to your health care professional or pharmacist for more information. Do not breast-feed an infant while taking this medicine. What side effects may I notice from receiving this medicine? Side effects that you should report to your doctor or health care professional as soon as possible: -allergic reactions like skin rash, itching or hives, swelling of the face, lips, or tongue -low blood counts - this medicine may decrease the number of white blood cells, red blood cells and platelets. You may be at increased risk for infections and bleeding. -signs of infection - fever or chills, cough, sore throat, pain or difficulty passing urine -signs of decreased platelets or bleeding - bruising, pinpoint red spots on the skin, black, tarry stools, blood in the urine -signs of decreased red blood cells - unusually weak or tired, fainting spells, lightheadedness -breathing problems -chest pain -mouth sores -nausea and vomiting -pain, swelling, redness at site where injected -pain, tingling, numbness in the hands or feet -stomach pain -swelling of ankles, feet, hands -unusual bleeding Side effects that usually do not require medical attention (report to your doctor or health care professional if they continue or are bothersome): -constipation -diarrhea -hair loss -loss of appetite -stomach  upset This list may not describe all possible side effects. Call your doctor for medical advice about side effects. You may report side effects to FDA at 1-800-FDA-1088. Where should I keep my medicine? This drug is given in a hospital or clinic and will not be stored at home. NOTE: This sheet is a summary. It may not cover all possible information. If you have questions about this medicine, talk to your doctor, pharmacist, or health care provider.  2018 Elsevier/Gold Standard (2008-02-12 18:45:54)   Carboplatin injection What is this medicine? CARBOPLATIN (KAR boe pla tin) is a chemotherapy drug. It targets fast dividing cells, like cancer cells, and causes these cells to die. This medicine is used to treat ovarian cancer and many other cancers. This medicine may be used for other purposes; ask your health care provider or pharmacist if you have questions. COMMON BRAND NAME(S): Paraplatin What should I tell my health care provider before I take this medicine? They need to know if you have any of these conditions: -blood disorders -hearing problems -kidney disease -recent or ongoing radiation therapy -an unusual or allergic reaction to carboplatin, cisplatin, other chemotherapy, other medicines, foods, dyes, or preservatives -pregnant or trying to get pregnant -breast-feeding How should I use this medicine? This drug is usually given as an infusion into a vein. It is administered in a hospital or clinic by a specially trained health care professional. Talk to your pediatrician regarding the use of this medicine in children. Special care may be needed.  Overdosage: If you think you have taken too much of this medicine contact a poison control center or emergency room at once. NOTE: This medicine is only for you. Do not share this medicine with others. What if I miss a dose? It is important not to miss a dose. Call your doctor or health care professional if you are unable to keep an  appointment. What may interact with this medicine? -medicines for seizures -medicines to increase blood counts like filgrastim, pegfilgrastim, sargramostim -some antibiotics like amikacin, gentamicin, neomycin, streptomycin, tobramycin -vaccines Talk to your doctor or health care professional before taking any of these medicines: -acetaminophen -aspirin -ibuprofen -ketoprofen -naproxen This list may not describe all possible interactions. Give your health care provider a list of all the medicines, herbs, non-prescription drugs, or dietary supplements you use. Also tell them if you smoke, drink alcohol, or use illegal drugs. Some items may interact with your medicine. What should I watch for while using this medicine? Your condition will be monitored carefully while you are receiving this medicine. You will need important blood work done while you are taking this medicine. This drug may make you feel generally unwell. This is not uncommon, as chemotherapy can affect healthy cells as well as cancer cells. Report any side effects. Continue your course of treatment even though you feel ill unless your doctor tells you to stop. In some cases, you may be given additional medicines to help with side effects. Follow all directions for their use. Call your doctor or health care professional for advice if you get a fever, chills or sore throat, or other symptoms of a cold or flu. Do not treat yourself. This drug decreases your body's ability to fight infections. Try to avoid being around people who are sick. This medicine may increase your risk to bruise or bleed. Call your doctor or health care professional if you notice any unusual bleeding. Be careful brushing and flossing your teeth or using a toothpick because you may get an infection or bleed more easily. If you have any dental work done, tell your dentist you are receiving this medicine. Avoid taking products that contain aspirin, acetaminophen,  ibuprofen, naproxen, or ketoprofen unless instructed by your doctor. These medicines may hide a fever. Do not become pregnant while taking this medicine. Women should inform their doctor if they wish to become pregnant or think they might be pregnant. There is a potential for serious side effects to an unborn child. Talk to your health care professional or pharmacist for more information. Do not breast-feed an infant while taking this medicine. What side effects may I notice from receiving this medicine? Side effects that you should report to your doctor or health care professional as soon as possible: -allergic reactions like skin rash, itching or hives, swelling of the face, lips, or tongue -signs of infection - fever or chills, cough, sore throat, pain or difficulty passing urine -signs of decreased platelets or bleeding - bruising, pinpoint red spots on the skin, black, tarry stools, nosebleeds -signs of decreased red blood cells - unusually weak or tired, fainting spells, lightheadedness -breathing problems -changes in hearing -changes in vision -chest pain -high blood pressure -low blood counts - This drug may decrease the number of white blood cells, red blood cells and platelets. You may be at increased risk for infections and bleeding. -nausea and vomiting -pain, swelling, redness or irritation at the injection site -pain, tingling, numbness in the hands or feet -problems with balance, talking, walking -trouble  passing urine or change in the amount of urine Side effects that usually do not require medical attention (report to your doctor or health care professional if they continue or are bothersome): -hair loss -loss of appetite -metallic taste in the mouth or changes in taste This list may not describe all possible side effects. Call your doctor for medical advice about side effects. You may report side effects to FDA at 1-800-FDA-1088. Where should I keep my medicine? This drug  is given in a hospital or clinic and will not be stored at home. NOTE: This sheet is a summary. It may not cover all possible information. If you have questions about this medicine, talk to your doctor, pharmacist, or health care provider.  2018 Elsevier/Gold Standard (2008-01-08 14:38:05)

## 2017-12-08 DIAGNOSIS — C675 Malignant neoplasm of bladder neck: Secondary | ICD-10-CM | POA: Diagnosis not present

## 2017-12-08 DIAGNOSIS — Z483 Aftercare following surgery for neoplasm: Secondary | ICD-10-CM | POA: Diagnosis not present

## 2017-12-08 DIAGNOSIS — C61 Malignant neoplasm of prostate: Secondary | ICD-10-CM | POA: Diagnosis not present

## 2017-12-08 DIAGNOSIS — I2510 Atherosclerotic heart disease of native coronary artery without angina pectoris: Secondary | ICD-10-CM | POA: Diagnosis not present

## 2017-12-08 DIAGNOSIS — I252 Old myocardial infarction: Secondary | ICD-10-CM | POA: Diagnosis not present

## 2017-12-08 DIAGNOSIS — Z436 Encounter for attention to other artificial openings of urinary tract: Secondary | ICD-10-CM | POA: Diagnosis not present

## 2017-12-12 ENCOUNTER — Telehealth: Payer: Self-pay | Admitting: *Deleted

## 2017-12-12 ENCOUNTER — Other Ambulatory Visit: Payer: Self-pay | Admitting: Oncology

## 2017-12-12 ENCOUNTER — Other Ambulatory Visit: Payer: Self-pay | Admitting: *Deleted

## 2017-12-12 DIAGNOSIS — I2510 Atherosclerotic heart disease of native coronary artery without angina pectoris: Secondary | ICD-10-CM | POA: Diagnosis not present

## 2017-12-12 DIAGNOSIS — Z436 Encounter for attention to other artificial openings of urinary tract: Secondary | ICD-10-CM | POA: Diagnosis not present

## 2017-12-12 DIAGNOSIS — I252 Old myocardial infarction: Secondary | ICD-10-CM | POA: Diagnosis not present

## 2017-12-12 DIAGNOSIS — Z483 Aftercare following surgery for neoplasm: Secondary | ICD-10-CM | POA: Diagnosis not present

## 2017-12-12 DIAGNOSIS — C675 Malignant neoplasm of bladder neck: Secondary | ICD-10-CM | POA: Diagnosis not present

## 2017-12-12 DIAGNOSIS — C61 Malignant neoplasm of prostate: Secondary | ICD-10-CM | POA: Diagnosis not present

## 2017-12-12 MED ORDER — MEGESTROL ACETATE 400 MG/10ML PO SUSP: 400.0000 mg | Freq: Three times a day (TID) | ORAL | 1 refills | Status: AC

## 2017-12-12 NOTE — Telephone Encounter (Signed)
Megace 400 mg/10 cc. 10 cc tid. No change in pain medication for now.

## 2017-12-12 NOTE — Telephone Encounter (Signed)
[facility] liz from [facility] calling [phone]) patient is not eating well. Only drinks water and ensure. States he is in pain all the time around groin and penis area. Takes oxycodone 5 mg one tablet very 4 hours, not helping. Would like appetite stimulant and something else for pain management

## 2017-12-12 NOTE — Telephone Encounter (Signed)
Spoke with wife ohma, re: megace script, she states patient has had diarrhea, instructed her to get imodium and take 2 tabs after each loose stool up to 8 per day.

## 2017-12-12 NOTE — Telephone Encounter (Signed)
No note

## 2017-12-14 ENCOUNTER — Other Ambulatory Visit: Payer: Self-pay | Admitting: *Deleted

## 2017-12-14 ENCOUNTER — Telehealth: Payer: Self-pay | Admitting: *Deleted

## 2017-12-14 ENCOUNTER — Encounter: Payer: Self-pay | Admitting: Oncology

## 2017-12-14 MED ORDER — OXYCODONE HCL 5 MG PO TABS: 5.0000 mg | ORAL_TABLET | ORAL | 0 refills | Status: AC | PRN

## 2017-12-14 MED ORDER — OXYCODONE HCL 5 MG PO TABS: 5.0000 mg | ORAL_TABLET | ORAL | 0 refills | Status: DC | PRN

## 2017-12-14 NOTE — Progress Notes (Signed)
Submitted auth request for Megestrol today.  Status is pending.

## 2017-12-14 NOTE — Telephone Encounter (Signed)
Oxycodone script printed on plain paper and was re-printed on prescription paper. Plain paper script was destroyed. Witnessed by [employer] rouse rn

## 2017-12-15 ENCOUNTER — Telehealth: Payer: Self-pay | Admitting: *Deleted

## 2017-12-15 ENCOUNTER — Encounter: Payer: Self-pay | Admitting: Oncology

## 2017-12-18 ENCOUNTER — Telehealth: Payer: Self-pay

## 2017-12-18 NOTE — Telephone Encounter (Signed)
Pt wife, Jess Barters, called the on-call line yesterday (12/17/17) with c/o scrotal swelling and itching. Called back today to f/u on symptoms. Edema is unchanged from yesterday, pain is 10/10, and itching is absent. Request to use benadryl if the itching returns. Spoke with Dr. Alen Blew who recommended staying on the pain medication as scheduled. Benadryl may be used if the itching returns. New treatment should begin to help with the symptoms, but may not see results yet. Pt wife verbalized understanding and thanks for the communication.

## 2017-12-19 ENCOUNTER — Telehealth: Payer: Self-pay

## 2017-12-19 DIAGNOSIS — I252 Old myocardial infarction: Secondary | ICD-10-CM | POA: Diagnosis not present

## 2017-12-19 DIAGNOSIS — Z483 Aftercare following surgery for neoplasm: Secondary | ICD-10-CM | POA: Diagnosis not present

## 2017-12-19 DIAGNOSIS — C675 Malignant neoplasm of bladder neck: Secondary | ICD-10-CM | POA: Diagnosis not present

## 2017-12-19 DIAGNOSIS — C61 Malignant neoplasm of prostate: Secondary | ICD-10-CM | POA: Diagnosis not present

## 2017-12-19 DIAGNOSIS — I2510 Atherosclerotic heart disease of native coronary artery without angina pectoris: Secondary | ICD-10-CM | POA: Diagnosis not present

## 2017-12-19 DIAGNOSIS — Z436 Encounter for attention to other artificial openings of urinary tract: Secondary | ICD-10-CM | POA: Diagnosis not present

## 2017-12-19 NOTE — Telephone Encounter (Signed)
Called to ask if Dr Alen Blew would be primary and sign certification. Per Dr. Alen Blew he will be primary and sign certification. Faxed office notes to [phone].

## 2017-12-19 NOTE — Telephone Encounter (Signed)
Nurse Kathlee Nations with [facility] called and left message. Called back. She saw patient  today. He has not eaten since last Thursday. Very weak and confused. Scrotum and penis is extremely swollen. Kathlee Nations the  nurse discussed hospice with patient and family. They are agreeable to being referred to hospice.

## 2017-12-19 NOTE — Telephone Encounter (Signed)
Ok to refer to hospice.

## 2017-12-19 NOTE — Telephone Encounter (Signed)
Called Daniel Vega back with [facility]. Daniel Vega will call referral to [facility].

## 2017-12-20 DIAGNOSIS — I519 Heart disease, unspecified: Secondary | ICD-10-CM | POA: Diagnosis not present

## 2017-12-20 DIAGNOSIS — R630 Anorexia: Secondary | ICD-10-CM | POA: Diagnosis not present

## 2017-12-20 DIAGNOSIS — C679 Malignant neoplasm of bladder, unspecified: Secondary | ICD-10-CM | POA: Diagnosis not present

## 2017-12-20 DIAGNOSIS — I10 Essential (primary) hypertension: Secondary | ICD-10-CM | POA: Diagnosis not present

## 2017-12-20 DIAGNOSIS — R52 Pain, unspecified: Secondary | ICD-10-CM | POA: Diagnosis not present

## 2017-12-20 DIAGNOSIS — R531 Weakness: Secondary | ICD-10-CM | POA: Diagnosis not present

## 2017-12-20 DIAGNOSIS — E7800 Pure hypercholesterolemia, unspecified: Secondary | ICD-10-CM | POA: Diagnosis not present

## 2017-12-22 DIAGNOSIS — E7800 Pure hypercholesterolemia, unspecified: Secondary | ICD-10-CM | POA: Diagnosis not present

## 2017-12-22 DIAGNOSIS — R52 Pain, unspecified: Secondary | ICD-10-CM | POA: Diagnosis not present

## 2017-12-22 DIAGNOSIS — I519 Heart disease, unspecified: Secondary | ICD-10-CM | POA: Diagnosis not present

## 2017-12-22 DIAGNOSIS — R531 Weakness: Secondary | ICD-10-CM | POA: Diagnosis not present

## 2017-12-22 DIAGNOSIS — C679 Malignant neoplasm of bladder, unspecified: Secondary | ICD-10-CM | POA: Diagnosis not present

## 2017-12-22 DIAGNOSIS — I10 Essential (primary) hypertension: Secondary | ICD-10-CM | POA: Diagnosis not present

## 2017-12-25 DIAGNOSIS — I519 Heart disease, unspecified: Secondary | ICD-10-CM | POA: Diagnosis not present

## 2017-12-25 DIAGNOSIS — C679 Malignant neoplasm of bladder, unspecified: Secondary | ICD-10-CM | POA: Diagnosis not present

## 2017-12-25 DIAGNOSIS — R531 Weakness: Secondary | ICD-10-CM | POA: Diagnosis not present

## 2017-12-25 DIAGNOSIS — I10 Essential (primary) hypertension: Secondary | ICD-10-CM | POA: Diagnosis not present

## 2017-12-25 DIAGNOSIS — E7800 Pure hypercholesterolemia, unspecified: Secondary | ICD-10-CM | POA: Diagnosis not present

## 2017-12-25 DIAGNOSIS — R52 Pain, unspecified: Secondary | ICD-10-CM | POA: Diagnosis not present

## 2017-12-27 DIAGNOSIS — E7800 Pure hypercholesterolemia, unspecified: Secondary | ICD-10-CM | POA: Diagnosis not present

## 2017-12-27 DIAGNOSIS — R531 Weakness: Secondary | ICD-10-CM | POA: Diagnosis not present

## 2017-12-27 DIAGNOSIS — C679 Malignant neoplasm of bladder, unspecified: Secondary | ICD-10-CM | POA: Diagnosis not present

## 2017-12-27 DIAGNOSIS — I10 Essential (primary) hypertension: Secondary | ICD-10-CM | POA: Diagnosis not present

## 2017-12-27 DIAGNOSIS — I519 Heart disease, unspecified: Secondary | ICD-10-CM | POA: Diagnosis not present

## 2017-12-27 DIAGNOSIS — R52 Pain, unspecified: Secondary | ICD-10-CM | POA: Diagnosis not present

## 2017-12-28 ENCOUNTER — Ambulatory Visit: Payer: Medicare Other

## 2017-12-28 ENCOUNTER — Ambulatory Visit: Payer: Medicare Other | Admitting: Oncology

## 2017-12-28 ENCOUNTER — Other Ambulatory Visit: Payer: Medicare Other

## 2017-12-28 DIAGNOSIS — R52 Pain, unspecified: Secondary | ICD-10-CM | POA: Diagnosis not present

## 2017-12-28 DIAGNOSIS — E7800 Pure hypercholesterolemia, unspecified: Secondary | ICD-10-CM | POA: Diagnosis not present

## 2017-12-28 DIAGNOSIS — I10 Essential (primary) hypertension: Secondary | ICD-10-CM | POA: Diagnosis not present

## 2017-12-28 DIAGNOSIS — R531 Weakness: Secondary | ICD-10-CM | POA: Diagnosis not present

## 2017-12-28 DIAGNOSIS — C679 Malignant neoplasm of bladder, unspecified: Secondary | ICD-10-CM | POA: Diagnosis not present

## 2017-12-28 DIAGNOSIS — I519 Heart disease, unspecified: Secondary | ICD-10-CM | POA: Diagnosis not present

## 2017-12-29 DIAGNOSIS — E7800 Pure hypercholesterolemia, unspecified: Secondary | ICD-10-CM | POA: Diagnosis not present

## 2017-12-29 DIAGNOSIS — R52 Pain, unspecified: Secondary | ICD-10-CM | POA: Diagnosis not present

## 2017-12-29 DIAGNOSIS — C679 Malignant neoplasm of bladder, unspecified: Secondary | ICD-10-CM | POA: Diagnosis not present

## 2017-12-29 DIAGNOSIS — R531 Weakness: Secondary | ICD-10-CM | POA: Diagnosis not present

## 2017-12-29 DIAGNOSIS — I10 Essential (primary) hypertension: Secondary | ICD-10-CM | POA: Diagnosis not present

## 2017-12-29 DIAGNOSIS — I519 Heart disease, unspecified: Secondary | ICD-10-CM | POA: Diagnosis not present

## 2017-12-30 DIAGNOSIS — I10 Essential (primary) hypertension: Secondary | ICD-10-CM | POA: Diagnosis not present

## 2017-12-30 DIAGNOSIS — R52 Pain, unspecified: Secondary | ICD-10-CM | POA: Diagnosis not present

## 2017-12-30 DIAGNOSIS — I519 Heart disease, unspecified: Secondary | ICD-10-CM | POA: Diagnosis not present

## 2017-12-30 DIAGNOSIS — E7800 Pure hypercholesterolemia, unspecified: Secondary | ICD-10-CM | POA: Diagnosis not present

## 2017-12-30 DIAGNOSIS — C679 Malignant neoplasm of bladder, unspecified: Secondary | ICD-10-CM | POA: Diagnosis not present

## 2017-12-30 DIAGNOSIS — R531 Weakness: Secondary | ICD-10-CM | POA: Diagnosis not present

## 2017-12-31 DIAGNOSIS — E7800 Pure hypercholesterolemia, unspecified: Secondary | ICD-10-CM | POA: Diagnosis not present

## 2017-12-31 DIAGNOSIS — I519 Heart disease, unspecified: Secondary | ICD-10-CM | POA: Diagnosis not present

## 2017-12-31 DIAGNOSIS — R52 Pain, unspecified: Secondary | ICD-10-CM | POA: Diagnosis not present

## 2017-12-31 DIAGNOSIS — R531 Weakness: Secondary | ICD-10-CM | POA: Diagnosis not present

## 2017-12-31 DIAGNOSIS — C679 Malignant neoplasm of bladder, unspecified: Secondary | ICD-10-CM | POA: Diagnosis not present

## 2017-12-31 DIAGNOSIS — I10 Essential (primary) hypertension: Secondary | ICD-10-CM | POA: Diagnosis not present

## 2018-01-15 NOTE — Telephone Encounter (Signed)
"  Unable to retrieve anything from my phone but it appears someone called from Dr. Hazeline Junker office about my husband Daniel Vega."\  Previous call information provided.    "Thank  You.  I will talk with my pharmacist about the cost."

## 2018-01-15 NOTE — Telephone Encounter (Signed)
LM on answering machine that megace was denied by [employer] and they may pay out of pocket if they want the medication.

## 2018-01-15 NOTE — Progress Notes (Signed)
Pt's Megestrol was denied.  Gave denial to the nurse.

## 2018-01-15 DEATH — deceased

## 2018-01-18 ENCOUNTER — Other Ambulatory Visit: Payer: Medicare Other

## 2018-01-18 ENCOUNTER — Ambulatory Visit: Payer: Medicare Other

## 2018-01-18 ENCOUNTER — Ambulatory Visit: Payer: Medicare Other | Admitting: Oncology

## 2018-02-14 ENCOUNTER — Ambulatory Visit: Payer: Medicare Other | Admitting: Oncology

## 2019-01-08 IMAGING — DX DG ABDOMEN 2V
2 series · 2 of 2 positions shown · non-contrast
Comparison: CT of the abdomen and pelvis 12/03/2014

CLINICAL DATA: Lower abdominal pain with constipation x 2 weeks,
r/o SBO

EXAM:
ABDOMEN - 2 VIEW

[dg abd 2 views (1 of 2)]
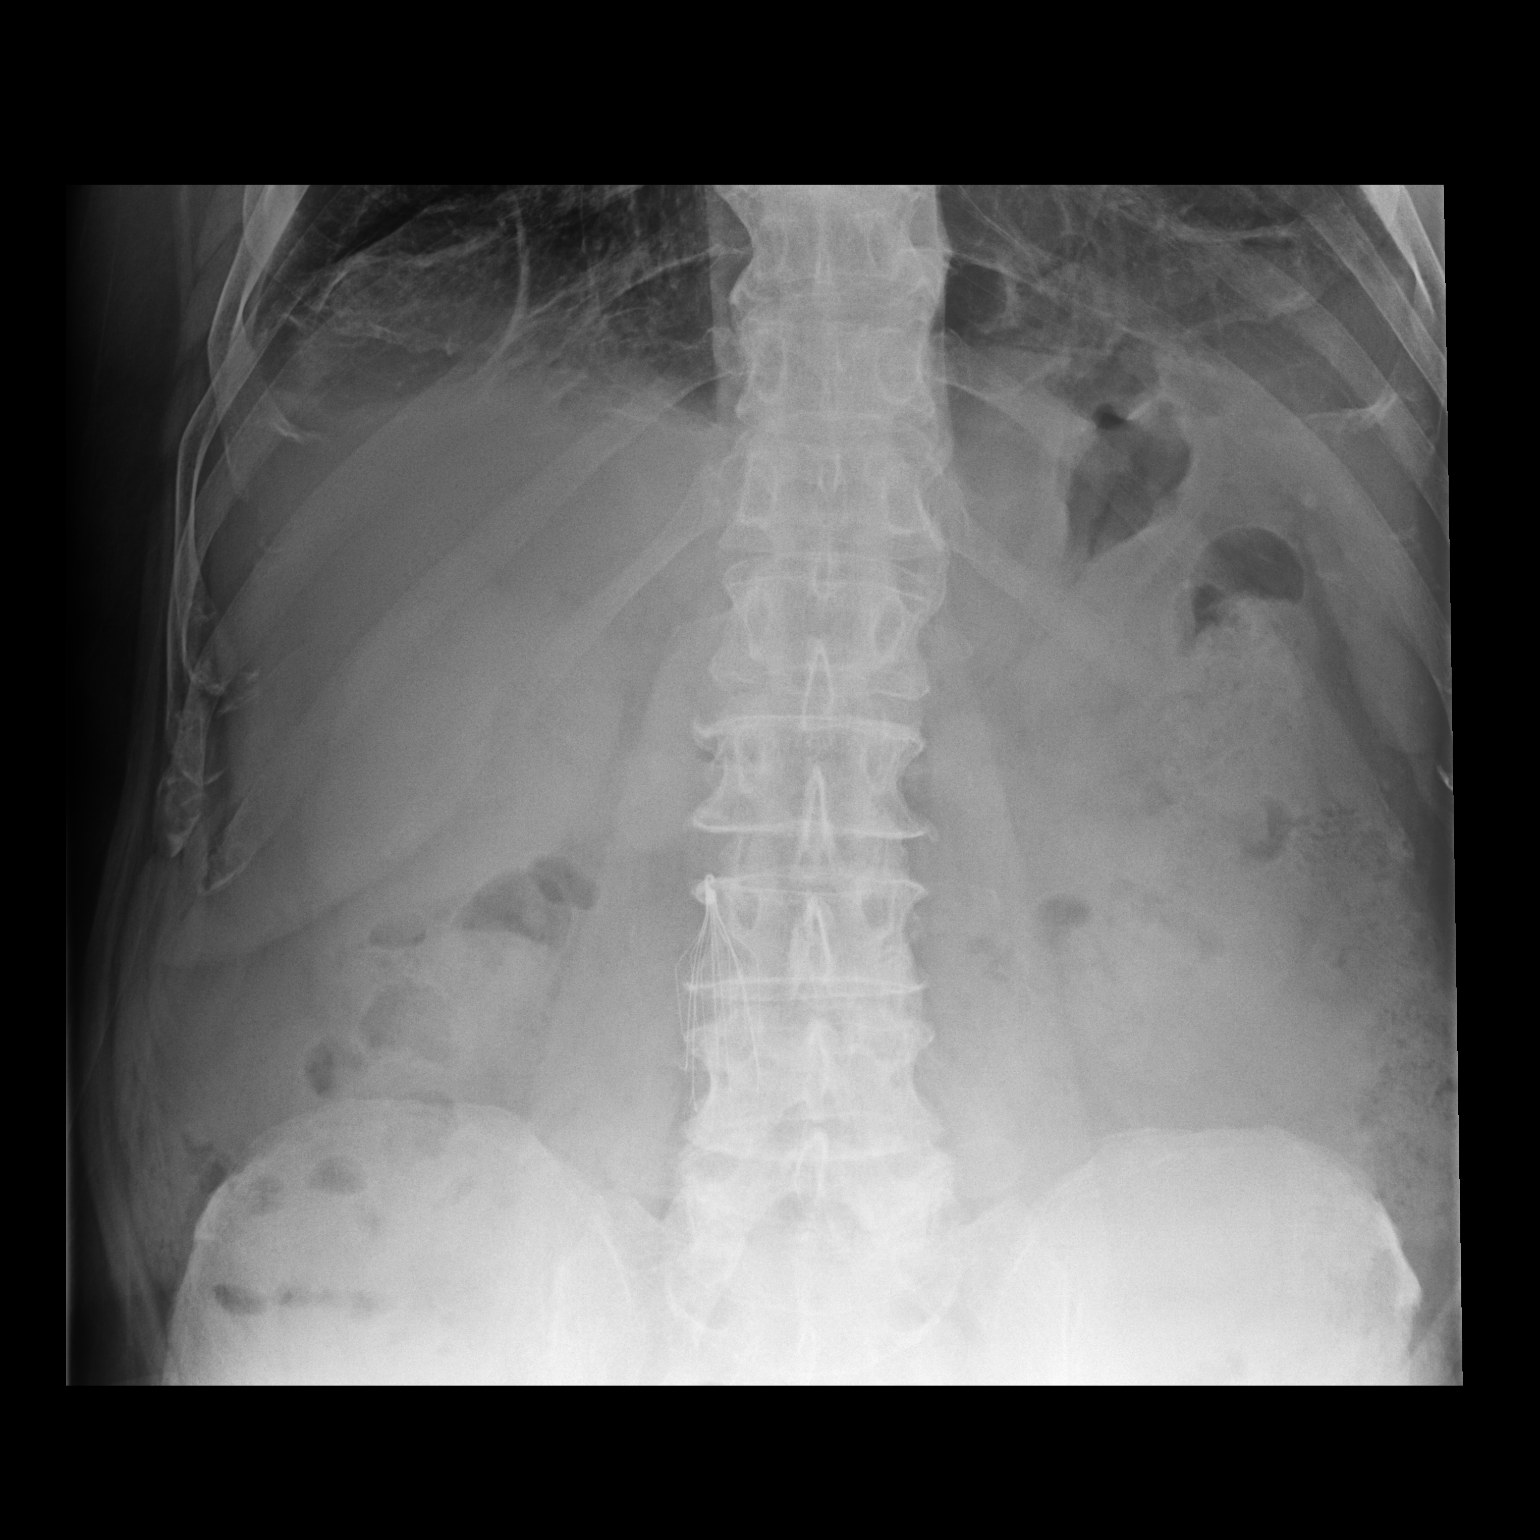

[dg abd 2 views (2 of 2)]
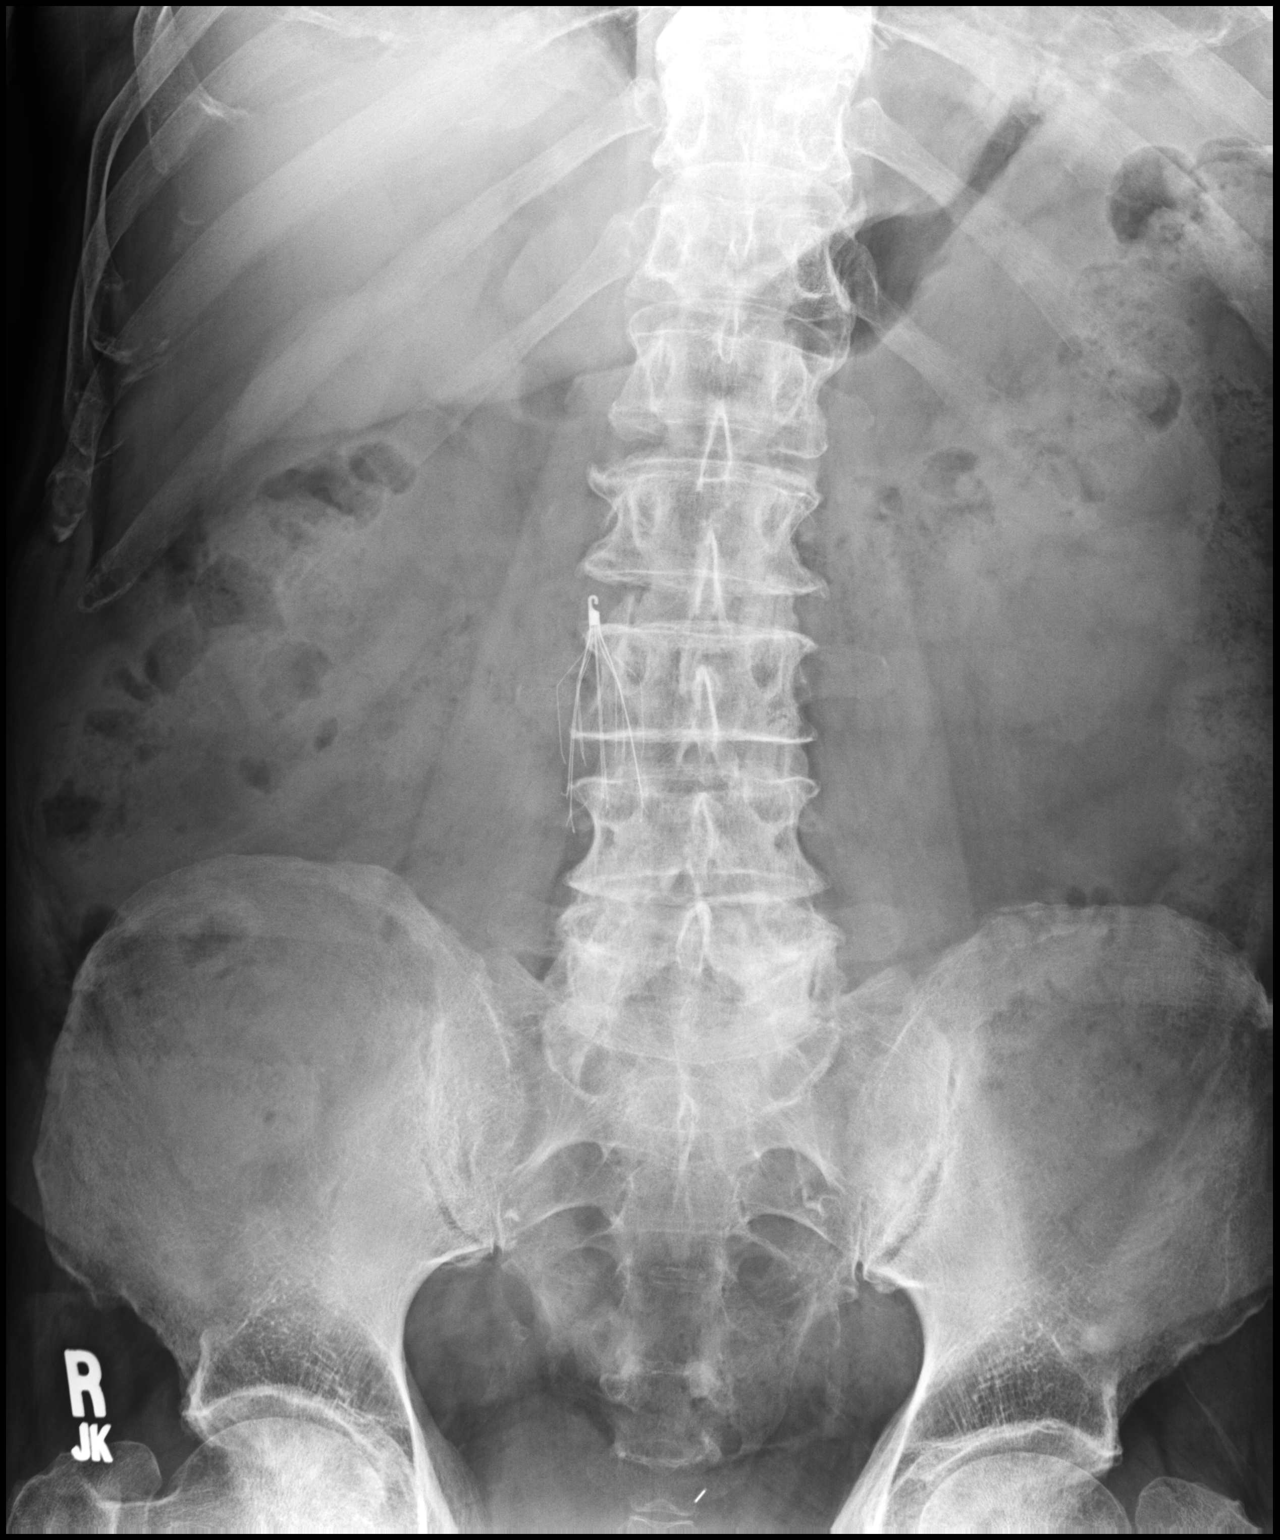

[2 of 2 positions shown; findings below may reference images not displayed]

FINDINGS: Bowel gas pattern is nonobstructed. There is a moderate stool burden
throughout nondilated loops of bowel. No evidence for organomegaly.
No abnormal calcifications. An inferior vena cava filter is in
place, apex overlying L2-3. Degenerative changes are seen in the
lower lumbar spine and SI joints.
IMPRESSION: Moderate stool burden.  No evidence for acute  abnormality.
